# Patient Record
Sex: Female | Born: 1975 | Hispanic: Yes | Marital: Married
Health system: Southern US, Academic
[De-identification: ages and names within clinical notes are randomized; demographics above are authoritative.]

## PROBLEM LIST (undated history)

## (undated) ENCOUNTER — Encounter

## (undated) ENCOUNTER — Ambulatory Visit

## (undated) ENCOUNTER — Telehealth

## (undated) ENCOUNTER — Encounter: Attending: Certified Registered" | Primary: Certified Registered"

## (undated) ENCOUNTER — Ambulatory Visit: Payer: PRIVATE HEALTH INSURANCE

## (undated) ENCOUNTER — Telehealth: Attending: Certified Registered" | Primary: Certified Registered"

## (undated) ENCOUNTER — Ambulatory Visit: Payer: BLUE CROSS/BLUE SHIELD

## (undated) ENCOUNTER — Encounter: Payer: MEDICAID | Attending: Internal Medicine | Primary: Internal Medicine

## (undated) ENCOUNTER — Encounter: Attending: Internal Medicine | Primary: Internal Medicine

## (undated) ENCOUNTER — Ambulatory Visit
Payer: BLUE CROSS/BLUE SHIELD | Attending: Student in an Organized Health Care Education/Training Program | Primary: Student in an Organized Health Care Education/Training Program

## (undated) ENCOUNTER — Ambulatory Visit: Payer: PRIVATE HEALTH INSURANCE | Attending: Infectious Disease | Primary: Infectious Disease

## (undated) ENCOUNTER — Ambulatory Visit: Payer: MEDICAID

## (undated) ENCOUNTER — Encounter
Attending: Pharmacist Clinician (PhC)/ Clinical Pharmacy Specialist | Primary: Pharmacist Clinician (PhC)/ Clinical Pharmacy Specialist

## (undated) ENCOUNTER — Ambulatory Visit: Attending: Internal Medicine | Primary: Internal Medicine

## (undated) DIAGNOSIS — K7460 Unspecified cirrhosis of liver: Secondary | ICD-10-CM

---
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
## 2014-10-26 ENCOUNTER — Emergency Department (INDEPENDENT_AMBULATORY_CARE_PROVIDER_SITE_OTHER)
Admission: EM | Admit: 2014-10-26 | Discharge: 2014-10-26 | Disposition: A | Discharge status: Home / Self Care | Payer: Self-pay | Source: Home / Self Care | Attending: Family Medicine | Admitting: Family Medicine

## 2014-10-26 ENCOUNTER — Encounter: Payer: Self-pay | Admitting: Nurse Practitioner

## 2014-10-26 ENCOUNTER — Encounter (HOSPITAL_COMMUNITY): Payer: Self-pay | Admitting: *Deleted

## 2014-10-26 DIAGNOSIS — Z8619 Personal history of other infectious and parasitic diseases: Secondary | ICD-10-CM

## 2014-10-26 DIAGNOSIS — K7469 Other cirrhosis of liver: Secondary | ICD-10-CM

## 2014-10-26 DIAGNOSIS — D696 Thrombocytopenia, unspecified: Secondary | ICD-10-CM

## 2014-10-26 DIAGNOSIS — D72819 Decreased white blood cell count, unspecified: Secondary | ICD-10-CM

## 2014-10-26 HISTORY — DX: Unspecified cirrhosis of liver: K74.60

## 2014-10-26 LAB — COMPREHENSIVE METABOLIC PANEL
ALK PHOS: 123 U/L — AB (ref 39–117)
ALT: 41 U/L — AB (ref 0–35)
AST: 48 U/L — ABNORMAL HIGH (ref 0–37)
Albumin: 3.5 g/dL (ref 3.5–5.2)
Anion gap: 8 (ref 5–15)
BILIRUBIN TOTAL: 1.3 mg/dL — AB (ref 0.3–1.2)
BUN: 6 mg/dL (ref 6–23)
CO2: 24 mmol/L (ref 19–32)
Calcium: 8.1 mg/dL — ABNORMAL LOW (ref 8.4–10.5)
Chloride: 105 mmol/L (ref 96–112)
Creatinine, Ser: 0.61 mg/dL (ref 0.50–1.10)
GLUCOSE: 113 mg/dL — AB (ref 70–99)
POTASSIUM: 3.7 mmol/L (ref 3.5–5.1)
SODIUM: 137 mmol/L (ref 135–145)
Total Protein: 6.4 g/dL (ref 6.0–8.3)

## 2014-10-26 LAB — CBC WITH DIFFERENTIAL/PLATELET
BASOS PCT: 0 % (ref 0–1)
Basophils Absolute: 0 10*3/uL (ref 0.0–0.1)
EOS PCT: 3 % (ref 0–5)
Eosinophils Absolute: 0.1 10*3/uL (ref 0.0–0.7)
HEMATOCRIT: 35.6 % — AB (ref 36.0–46.0)
HEMOGLOBIN: 12.1 g/dL (ref 12.0–15.0)
LYMPHS PCT: 25 % (ref 12–46)
Lymphs Abs: 0.9 10*3/uL (ref 0.7–4.0)
MCH: 26.4 pg (ref 26.0–34.0)
MCHC: 34 g/dL (ref 30.0–36.0)
MCV: 77.7 fL — AB (ref 78.0–100.0)
MONO ABS: 0.3 10*3/uL (ref 0.1–1.0)
Monocytes Relative: 9 % (ref 3–12)
NEUTROS ABS: 2.2 10*3/uL (ref 1.7–7.7)
Neutrophils Relative %: 63 % (ref 43–77)
PLATELETS: 74 10*3/uL — AB (ref 150–400)
RBC: 4.58 MIL/uL (ref 3.87–5.11)
RDW: 17.2 % — AB (ref 11.5–15.5)
WBC: 3.6 10*3/uL — ABNORMAL LOW (ref 4.0–10.5)

## 2014-10-26 LAB — LIPASE, BLOOD: LIPASE: 27 U/L (ref 11–59)

## 2014-10-26 LAB — SEDIMENTATION RATE: SED RATE: 5 mm/h (ref 0–22)

## 2014-10-26 NOTE — ED Provider Notes (Signed)
CSN: [id number]     Arrival date & time 10/26/14  [id number] History   First MD Initiated Contact with Patient 10/26/14 1050     Chief Complaint  Patient presents with  . Hepatic Disease   (Consider location/radiation/quality/duration/timing/severity/associated sxs/prior Treatment) HPI      39 year old female with history of chronic cirrhosis, with a remote history of hepatitis A, presents complaining of pain in her liver as well as bone pain. She describes this as feeling like her liver pulses and throbs. It has been feeling this way for about 3 weeks. Also she describes pain all over her entire body for the past week in her bones. No alleviating or exacerbating factors. She is new to the area, she has never seen a gastroenterologist. She has frequent visits to the emergency department where she is from but they always tell her the same thing, she has cirrhosis and she needs to see her primary care provider.  Past Medical History  Diagnosis Date  . Hepatic cirrhosis    History reviewed. No pertinent past surgical history. History reviewed. No pertinent family history. History  Substance Use Topics  . Smoking status: Never Smoker   . Smokeless tobacco: Not on file  . Alcohol Use: No   OB History    Gravida Para Term Preterm AB TAB SAB Ectopic Multiple Living   1 1             Review of Systems  Constitutional: Negative for fever and chills.  Cardiovascular: Positive for leg swelling (chronic, controlled with Lasix).  Gastrointestinal: Positive for abdominal distention. Negative for nausea, vomiting and diarrhea.  Musculoskeletal:       Bone pain  All other systems reviewed and are negative.   Allergies  Penicillins  Home Medications   Prior to Admission medications   Medication Sig Start Date End Date Taking? Authorizing Provider  furosemide (LASIX) 20 MG tablet Take 20 mg by mouth.   Yes Historical Provider, MD  spironolactone (ALDACTONE) 25 MG tablet Take 25 mg by mouth daily.    Yes Historical Provider, MD   BP 129/75 mmHg  Pulse 67  Temp(Src) 98.3 F (36.8 C) (Oral)  Resp 14  SpO2 100% Physical Exam  Constitutional: She is oriented to person, place, and time. Vital signs are normal. She appears well-developed and well-nourished. No distress.  HENT:  Head: Normocephalic and atraumatic.  Eyes: Scleral icterus is present.  Pulmonary/Chest: Effort normal. No respiratory distress.  Abdominal: Soft. She exhibits no distension, no pulsatile liver, no ascites and no pulsatile midline mass. There is tenderness in the right upper quadrant. There is no rebound, no guarding and no CVA tenderness.  Neurological: She is alert and oriented to person, place, and time. She has normal strength. Coordination normal.  Skin: Skin is warm and dry. No rash noted. She is not diaphoretic.  Psychiatric: She has a normal mood and affect. Judgment normal.  Nursing note and vitals reviewed.   ED Course  Procedures (including critical care time) Labs Review Labs Reviewed  COMPREHENSIVE METABOLIC PANEL - Abnormal; Notable for the following:    Glucose, Bld 113 (*)    Calcium 8.1 (*)    AST 48 (*)    ALT 41 (*)    Alkaline Phosphatase 123 (*)    Total Bilirubin 1.3 (*)    All other components within normal limits  CBC WITH DIFFERENTIAL/PLATELET - Abnormal; Notable for the following:    WBC 3.6 (*)    HCT 35.6 (*)  MCV 77.7 (*)    RDW 17.2 (*)    Platelets 74 (*)    All other components within normal limits  LIPASE, BLOOD  SEDIMENTATION RATE    Imaging Review No results found.   MDM   1. Other cirrhosis of liver   2. History of hepatitis A   3. Chronic leukopenia   4. Thrombocytopenia    She has chronic cirrhosis, her Labs are unchanged from her previous labs 10 days ago. There is no evidence of any acute decompensation in her condition or encephalopathy. I have made her a follow-up appointment with gastroenterology for next week. We have also discussed with her  the importance of primary care follow-up, [facility] as [facility].    Graylon Good, PA-C 10/26/14 1300

## 2014-10-26 NOTE — ED Notes (Signed)
Pt states she is here to review her labs/medical history re: hepatic disease.

## 2014-10-26 NOTE — Discharge Instructions (Signed)
Cirrhosis  Cirrhosis is a condition of scarring of the liver which is caused when the liver has tried repairing itself following damage. This damage may come from a previous infection such as one of the forms of hepatitis (usually hepatitis C), or the damage may come from being injured by toxins. The main toxin that causes this damage is alcohol. The scarring of the liver from use of alcohol is irreversible. That means the liver cannot return to normal even though alcohol is not used any more. The main danger of hepatitis C infection is that it may cause long-lasting (chronic) liver disease, and this also may lead to cirrhosis. This complication is progressive and irreversible.  CAUSES   Prior to available blood tests, hepatitis C could be contracted by blood transfusions. Since testing of blood has improved, this is now unlikely. This infection can also be contracted through intravenous drug use and the sharing of needles. It can also be contracted through sexual relationships. The injury caused by alcohol comes from too much use. It is not a few drinks that poison the liver, but years of misuse. Usually there will be some signs and symptoms early with scarring of the liver that suggest the development of better habits. Alcohol should never be used while using acetaminophen. A small dose of both taken together may cause irreversible damage to the liver.  HOME CARE INSTRUCTIONS   There is no specific treatment for cirrhosis. However, there are things you can do to avoid making the condition worse.  · Rest as needed.  · Eat a well-balanced diet. Your caregiver can help you with suggestions.  · Vitamin supplements including vitamins A, K, D, and thiamine can help.  · A low-salt diet, water restriction, or diuretic medicine may be needed to reduce fluid retention.  · Avoid alcohol. This can be extremely toxic if combined with acetaminophen.  · Avoid drugs which are toxic to the liver. Some of these include isoniazid,  methyldopa, acetaminophen, anabolic steroids (muscle-building drugs), erythromycin, and oral contraceptives (birth control pills). Check with your caregiver to make sure medicines you are presently taking will not be harmful.  · Periodic blood tests may be required. Follow your caregiver's advice regarding the timing of these.  · Milk thistle is an herbal remedy which does protect the liver against toxins. However, it will not help once the liver has been scarred.  SEEK MEDICAL CARE IF:  · You have increasing fatigue or weakness.  · You develop swelling of the hands, feet, legs, or face.  · You vomit bright red blood, or a coffee ground appearing material.  · You have blood in your stools, or the stools turn black and tarry.  · You have a fever.  · You develop loss of appetite, or have nausea and vomiting.  · You develop jaundice.  · You develop easy bruising or bleeding.  · You have worsening of any of the problems you are concerned about.  Document Released: 08/10/2005 Document Revised: 11/02/2011 Document Reviewed: 03/28/2008  ExitCare® Patient Information ©2015 [employer]. This information is not intended to replace advice given to you by your health care provider. Make sure you discuss any questions you have with your health care provider.

## 2014-11-01 ENCOUNTER — Other Ambulatory Visit: Payer: Self-pay

## 2014-11-01 ENCOUNTER — Encounter: Payer: Self-pay | Admitting: Nurse Practitioner

## 2014-11-01 ENCOUNTER — Ambulatory Visit (INDEPENDENT_AMBULATORY_CARE_PROVIDER_SITE_OTHER): Payer: Self-pay | Admitting: Nurse Practitioner

## 2014-11-01 VITALS — BP 106/70 | HR 68 | Ht 62.5 in | Wt 154.1 lb

## 2014-11-01 DIAGNOSIS — K7460 Unspecified cirrhosis of liver: Secondary | ICD-10-CM

## 2014-11-01 DIAGNOSIS — R1011 Right upper quadrant pain: Secondary | ICD-10-CM

## 2014-11-01 NOTE — Patient Instructions (Signed)
Go to the basement today for labs  You have been scheduled for an endoscopy. Please follow written instructions given to you at your visit today. If you use inhalers (even only as needed), please bring them with you on the day of your procedure. Your physician has requested that you go to www.startemmi.com and enter the access code given to you at your visit today. This web site gives a general overview about your procedure. However, you should still follow specific instructions given to you by our office regarding your preparation for the procedure.  Follow up with Dr Arlyce DiceKaplan on 12/24/2014 at 9:15am

## 2014-11-01 NOTE — Progress Notes (Addendum)
    HPI : Patient is a 39 year old female referred by Dr. Konrad DoloresMerrell at [facility]. She has a history of cirrhosis diagnosed in 2008. Etiology unknown, she gives a history of hepatitis A. No alcohol use. No family history of cirrhosis. Patient was hospitalized in [place] February 2013, she brought in some records. At that time her ferritin was 15, ANA negative, alpha-1 antitrypsin normal, smooth muscle antibody negative, ceruloplasmin negative, antimitochondrial antibody. Total hepatitis A antibody was reactive. Hepatitis B surface antibody was negative.. Her INR was 1.4, ALT 47, AST 47, WBC 2.8, platelet count 58, hemoglobin 13.1.  Patient has not been followed by a gastroenterologist or hepatologist.  She has had several emergency room department visits [place] and [place]. Her most health care visit was to sit to urgent care on the seventh of this month was for evaluation of bone and liver pain. Labs today reveal AST 48, ALT 41, alkaline phosphatase 123, total bilirubin 1.3. White count 3.6, platelets 74.   Past Medical History  Diagnosis Date  . Hepatic cirrhosis     Family History  Problem Relation Age of Onset  . Hypertension Mother   . Diabetes Father    History  Substance Use Topics  . Smoking status: Never Smoker   . Smokeless tobacco: Never Used  . Alcohol Use: No   Current Outpatient Prescriptions  Medication Sig Dispense Refill  . furosemide (LASIX) 20 MG tablet Take 20 mg by mouth.    . spironolactone (ALDACTONE) 25 MG tablet Take 25 mg by mouth daily.     No current facility-administered medications for this visit.   Allergies  Allergen Reactions  . Penicillins      Review of Systems: All systems reviewed and negative except where noted in HPI.   Physical Exam: BP 106/70 mmHg  Pulse 68  Ht 5' 2.5" (1.588 m)  Wt 154 lb 2 oz (69.911 kg)  BMI 27.72 kg/m2  LMP 10/25/2014 Constitutional: Pleasant,well-developed, female in no acute  distress. HEENT: Normocephalic and atraumatic. Conjunctivae are normal. No scleral icterus. Neck supple.  Cardiovascular: Normal rate, regular rhythm.  Pulmonary/chest: Effort normal and breath sounds normal. No wheezing, rales or rhonchi. Abdominal: Soft, nondistended, nontender. Bowel sounds active throughout. There are no masses palpable.  Extremities: no edema Lymphadenopathy: No cervical adenopathy noted. Neurological: Alert and oriented to person place and time. Skin: Skin is warm and dry. No rashes noted. Psychiatric: Normal mood and affect. Behavior is normal.   ASSESSMENT AND PLAN:  451.  39 year old female from [place] with cirrhosis (probably idiopathic) diagnosed 8 years ago. She has had no outpatient care with gastroenterology or hepatology. Etiology of cirrhosis unclear. Genetic and autoimmune markers negative in 2013 [facility]([place]). Additionally check for chronic hepatitis B and hepatitis C. No evidence for decompensation.   Check hepatitis A,B,C serologies. May need Hep A / B vaccinations if not immune.  Check IgG  Check alpha-fetoprotein and ultrasound for HCC screening.   Patient needs upper endoscopy for varices screening.  Outpatient follow-up with us will be important and she will likely need referral to transplant hepatology at some point.  Referral to [facility] financial services. She has no insurance  2. RUQ pain. She feels like "liver throbs" when she eats. Await ultrasound and EGD results.

## 2014-11-02 DIAGNOSIS — K7460 Unspecified cirrhosis of liver: Secondary | ICD-10-CM | POA: Insufficient documentation

## 2014-11-02 DIAGNOSIS — R1011 Right upper quadrant pain: Secondary | ICD-10-CM | POA: Insufficient documentation

## 2014-11-02 LAB — IGG: IGG (IMMUNOGLOBIN G), SERUM: 1390 mg/dL (ref 690–1700)

## 2014-11-02 LAB — HEPATITIS C ANTIBODY: HCV AB: NEGATIVE

## 2014-11-02 LAB — HEPATITIS B SURFACE ANTIGEN: Hepatitis B Surface Ag: NEGATIVE

## 2014-11-02 LAB — HEPATITIS A ANTIBODY, TOTAL: Hep A Total Ab: REACTIVE — AB

## 2014-11-02 LAB — AFP TUMOR MARKER: AFP TUMOR MARKER: 2.1 ng/mL (ref <6.1)

## 2014-11-02 LAB — HEPATITIS B SURFACE ANTIBODY,QUALITATIVE: Hep B S Ab: NEGATIVE

## 2014-11-04 NOTE — Progress Notes (Signed)
Reviewed and agree with management. Robert D. Kaplan, M.D., FACG  

## 2014-11-07 ENCOUNTER — Other Ambulatory Visit: Payer: Self-pay

## 2014-11-07 ENCOUNTER — Ambulatory Visit (AMBULATORY_SURGERY_CENTER): Payer: Self-pay | Admitting: Gastroenterology

## 2014-11-07 ENCOUNTER — Encounter: Payer: Self-pay | Admitting: Gastroenterology

## 2014-11-07 VITALS — BP 114/74 | HR 62 | Temp 97.6°F | Resp 16 | Ht 60.0 in | Wt 154.0 lb

## 2014-11-07 DIAGNOSIS — K7460 Unspecified cirrhosis of liver: Secondary | ICD-10-CM

## 2014-11-07 DIAGNOSIS — R1011 Right upper quadrant pain: Secondary | ICD-10-CM

## 2014-11-07 DIAGNOSIS — I8500 Esophageal varices without bleeding: Secondary | ICD-10-CM

## 2014-11-07 MED ORDER — NADOLOL 40 MG PO TABS: 40.0000 mg | ORAL_TABLET | Freq: Every day | ORAL | Status: DC

## 2014-11-07 MED ORDER — SODIUM CHLORIDE 0.9 % IV SOLN: 500.0000 mL | INTRAVENOUS | Status: DC

## 2014-11-07 NOTE — Op Note (Signed)
 [facility] [address] [place], [id number]   ENDOSCOPY PROCEDURE REPORT  PATIENT: Yolanda Guzman, Pricella  MR#: [id number] BIRTHDATE: 12-09-1975 , 38  yrs. old GENDER: female ENDOSCOPIST: Louis Meckelobert D Kaplan, MD REFERRED BY: PROCEDURE DATE:  11/07/2014 PROCEDURE:  EGD, diagnostic ASA CLASS:     Class III INDICATIONS:  screening for varices. MEDICATIONS: Monitored anesthesia care and Propofol 250 mg IV TOPICAL ANESTHETIC:  DESCRIPTION OF PROCEDURE: After the risks benefits and alternatives of the procedure were thoroughly explained, informed consent was obtained.  The LB [id number] endoscope was introduced through the mouth and advanced to the second portion of the duodenum , Without limitations.  The instrument was slowly withdrawn as the mucosa was fully examined.      ESOPHAGUS: There were 2 columns of medium sized varices in the distal esophagus.  Grade 2-3.  STOMACH: Congested gastropathy was found in the cardia and gastric fundus.  Retroflexed views revealed no abnormalities.     The scope was then withdrawn from the patient and the procedure completed.  COMPLICATIONS: There were no immediate complications.  ENDOSCOPIC IMPRESSION: 1.  Esophageal varices 2.  Portal hypertensive gastropathy  RECOMMENDATIONS: 1.  Begin nadolol 40 mg daily 2.  Follow-up endoscopy one year 3.  Vaccination for hepatitis B 4.  Abdominal ultrasound 5.  Office visit 3 months  REPEAT EXAM:  eSigned:  Louis Meckelobert D Kaplan, MD 11/07/2014 11:43 AM    CC:  PATIENT NAME:  Yolanda Guzman, Irini MR#: [id number]

## 2014-11-07 NOTE — Patient Instructions (Addendum)
YOU HAD AN ENDOSCOPIC PROCEDURE TODAY AT THE [facility]:   Refer to the procedure report that was given to you for any specific questions about what was found during the examination.  If the procedure report does not answer your questions, please call your gastroenterologist to clarify.  If you requested that your care partner not be given the details of your procedure findings, then the procedure report has been included in a sealed envelope for you to review at your convenience later.  YOU SHOULD EXPECT: Some feelings of bloating in the abdomen. Passage of more gas than usual.  Walking can help get rid of the air that was put into your GI tract during the procedure and reduce the bloating. If you had a lower endoscopy (such as a colonoscopy or flexible sigmoidoscopy) you may notice spotting of blood in your stool or on the toilet paper. If you underwent a bowel prep for your procedure, you may not have a normal bowel movement for a few days.  Please Note:  You might notice some irritation and congestion in your nose or some drainage.  This is from the oxygen used during your procedure.  There is no need for concern and it should clear up in a day or so.  SYMPTOMS TO REPORT IMMEDIATELY:   Following upper endoscopy (EGD)  Vomiting of blood or coffee ground material  New chest pain or pain under the shoulder blades  Painful or persistently difficult swallowing  New shortness of breath  Fever of 100F or higher  Black, tarry-looking stools  For urgent or emergent issues, a gastroenterologist can be reached at any hour by calling [phone].   DIET: Your first meal following the procedure should be a small meal and then it is ok to progress to your normal diet. Heavy or fried foods are harder to digest and may make you feel nauseous or bloated.  Likewise, meals heavy in dairy and vegetables can increase bloating.  Drink plenty of fluids but you should avoid alcoholic beverages for  24 hours.  ACTIVITY:  You should plan to take it easy for the rest of today and you should NOT DRIVE or use heavy machinery until tomorrow (because of the sedation medicines used during the test).    FOLLOW UP: Our staff will call the number listed on your records the next business day following your procedure to check on you and address any questions or concerns that you may have regarding the information given to you following your procedure. If we do not reach you, we will leave a message.  However, if you are feeling well and you are not experiencing any problems, there is no need to return our call.  We will assume that you have returned to your regular daily activities without incident.  If any biopsies were taken you will be contacted by phone or by letter within the next 1-3 weeks.  Please call us at [phone] if you have not heard about the biopsies in 3 weeks.    SIGNATURES/CONFIDENTIALITY: You and/or your care partner have signed paperwork which will be entered into your electronic medical record.  These signatures attest to the fact that that the information above on your After Visit Summary has been reviewed and is understood.  Full responsibility of the confidentiality of this discharge information lies with you and/or your care-partner.  Recommendations Discharge instructions given to patient and/or care partner. Ultrasound to be scheduled. Nadolol 40 mg daily. Office visit in  3 months.

## 2014-11-08 ENCOUNTER — Telehealth: Payer: Self-pay

## 2014-11-08 ENCOUNTER — Ambulatory Visit (HOSPITAL_COMMUNITY)
Admission: RE | Admit: 2014-11-08 | Discharge: 2014-11-08 | Disposition: A | Discharge status: Home / Self Care | Payer: Self-pay | Source: Ambulatory Visit | Attending: Gastroenterology | Admitting: Gastroenterology

## 2014-11-08 ENCOUNTER — Other Ambulatory Visit: Payer: Self-pay

## 2014-11-08 DIAGNOSIS — K7460 Unspecified cirrhosis of liver: Secondary | ICD-10-CM

## 2014-11-08 DIAGNOSIS — R1011 Right upper quadrant pain: Secondary | ICD-10-CM | POA: Insufficient documentation

## 2014-11-08 DIAGNOSIS — Z23 Encounter for immunization: Secondary | ICD-10-CM

## 2014-11-08 DIAGNOSIS — I8510 Secondary esophageal varices without bleeding: Secondary | ICD-10-CM | POA: Insufficient documentation

## 2014-11-08 NOTE — Telephone Encounter (Signed)
  Follow up Call-  Call back number 11/07/2014  Post procedure Call Back phone  # [phone]  Permission to leave phone message Yes     Patient questions:  Do you have a fever, pain , or abdominal swelling? No. Pain Score  0 *  Have you tolerated food without any problems? Yes.    Have you been able to return to your normal activities? Yes.    Do you have any questions about your discharge instructions: Diet   No. Medications  No. Follow up visit  No.  Do you have questions or concerns about your Care? No.  Actions: * If pain score is 4 or above: No action needed, pain <4.

## 2014-11-26 ENCOUNTER — Ambulatory Visit: Payer: Self-pay | Attending: Internal Medicine

## 2014-12-03 ENCOUNTER — Encounter: Payer: Self-pay | Admitting: Internal Medicine

## 2014-12-03 ENCOUNTER — Ambulatory Visit: Payer: Self-pay | Attending: Internal Medicine | Admitting: Internal Medicine

## 2014-12-03 VITALS — BP 116/73 | HR 72 | Temp 98.8°F | Resp 16 | Wt 157.0 lb

## 2014-12-03 DIAGNOSIS — Z833 Family history of diabetes mellitus: Secondary | ICD-10-CM

## 2014-12-03 DIAGNOSIS — Z139 Encounter for screening, unspecified: Secondary | ICD-10-CM

## 2014-12-03 DIAGNOSIS — K029 Dental caries, unspecified: Secondary | ICD-10-CM

## 2014-12-03 DIAGNOSIS — I8500 Esophageal varices without bleeding: Secondary | ICD-10-CM

## 2014-12-03 DIAGNOSIS — K7460 Unspecified cirrhosis of liver: Secondary | ICD-10-CM

## 2014-12-03 DIAGNOSIS — H538 Other visual disturbances: Secondary | ICD-10-CM

## 2014-12-03 LAB — COMPLETE METABOLIC PANEL WITH GFR
ALBUMIN: 3 g/dL — AB (ref 3.5–5.2)
ALT: 33 U/L (ref 0–35)
AST: 42 U/L — ABNORMAL HIGH (ref 0–37)
Alkaline Phosphatase: 98 U/L (ref 39–117)
BUN: 7 mg/dL (ref 6–23)
CO2: 28 mEq/L (ref 19–32)
Calcium: 7.8 mg/dL — ABNORMAL LOW (ref 8.4–10.5)
Chloride: 105 mEq/L (ref 96–112)
Creat: 0.51 mg/dL (ref 0.50–1.10)
GFR, Est African American: >89 mL/min
GFR, Est Non African American: >89 mL/min
Glucose, Bld: 172 mg/dL — ABNORMAL HIGH (ref 70–99)
POTASSIUM: 4.1 meq/L (ref 3.5–5.3)
Sodium: 136 mEq/L (ref 135–145)
Total Bilirubin: 0.7 mg/dL (ref 0.2–1.2)
Total Protein: 5.4 g/dL — ABNORMAL LOW (ref 6.0–8.3)

## 2014-12-03 LAB — TSH: TSH: 1.821 u[IU]/mL (ref 0.350–4.500)

## 2014-12-03 LAB — HEMOGLOBIN A1C
HEMOGLOBIN A1C: 5.7 % — AB (ref <5.7)
MEAN PLASMA GLUCOSE: 117 mg/dL — AB (ref <117)

## 2014-12-03 NOTE — Progress Notes (Signed)
Patient Demographics  Yolanda Guzman, is a 39 y.o. female  ZOX:[id number]:[id number]  WJX:[id number]  DOB - [birth year]  CC:  Chief Complaint  Patient presents with  . new patient       HPI: Yolanda Guzman is a 39 y.o. female here today to establish medical care.patient has history of cirrhosis, has been following up with the GI, EMR reviewed patient had endoscopy done reported to have these official lysis, she was put on nadolol which she has been taking, also had abdominal ultrasound done which reported ascites, since then patient has been on Lasix and spironolactone, she is also getting hepatitis B vaccination series, first shot she got last month and she is due for another one and is already scheduled, today she is requesting referral back for GI, also complaining of blurry vision and she wears corrective glasses and is requesting referral to see ophthalmologist, has several dental cavities and is requesting referral to see a dentist Patient has No headache, No chest pain, No abdominal pain - No Nausea, No new weakness tingling or numbness, No Cough - SOB.  Allergies  Allergen Reactions  . Penicillins    Past Medical History  Diagnosis Date  . Hepatic cirrhosis    Current Outpatient Prescriptions on File Prior to Visit  Medication Sig Dispense Refill  . furosemide (LASIX) 20 MG tablet Take 20 mg by mouth.    . nadolol (CORGARD) 40 MG tablet Take 1 tablet (40 mg total) by mouth daily. 30 tablet 5  . spironolactone (ALDACTONE) 25 MG tablet Take 25 mg by mouth daily.     No current facility-administered medications on file prior to visit.   Family History  Problem Relation Age of Onset  . Hypertension Mother   . Diabetes Father    History   Social History  . Marital Status: Married    Spouse Name: N/A  . Number of Children: 2  . Years of Education: N/A   Occupational History  . Not on file.   Social History Main Topics  . Smoking status: Never Smoker   . Smokeless tobacco:  Never Used  . Alcohol Use: No  . Drug Use: No  . Sexual Activity: Yes   Other Topics Concern  . Not on file   Social History Narrative    Review of Systems: Constitutional: Negative for fever, chills, diaphoresis, activity change, appetite change and fatigue. HENT: Negative for ear pain, nosebleeds, congestion, facial swelling, rhinorrhea, neck pain, neck stiffness and ear discharge.  Eyes: Negative for pain, discharge, redness, itching and visual disturbance. Respiratory: Negative for cough, choking, chest tightness, shortness of breath, wheezing and stridor.  Cardiovascular: Negative for chest pain, palpitations and leg swelling. Gastrointestinal: Negative for abdominal distention. Genitourinary: Negative for dysuria, urgency, frequency, hematuria, flank pain, decreased urine volume, difficulty urinating and dyspareunia.  Musculoskeletal: Negative for back pain, joint swelling, arthralgia and gait problem. Neurological: Negative for dizziness, tremors, seizures, syncope, facial asymmetry, speech difficulty, weakness, light-headedness, numbness and headaches.  Hematological: Negative for adenopathy. Does not bruise/bleed easily. Psychiatric/Behavioral: Negative for hallucinations, behavioral problems, confusion, dysphoric mood, decreased concentration and agitation.    Objective:   Filed Vitals:   12/03/14 1550  BP: 116/73  Pulse: 72  Temp: 98.8 F (37.1 C)  Resp: 16    Physical Exam: Constitutional: Patient appears well-developed and well-nourished. No distress. HENT: Normocephalic, atraumatic, External right and left ear normal. Oropharynx is clear and moist.  Eyes: Conjunctivae and EOM are normal. PERRLA, no scleral icterus. Neck:  Normal ROM. Neck supple. No JVD. No tracheal deviation. No thyromegaly. CVS: RRR, S1/S2 +, no murmurs, no gallops, no carotid bruit.  Pulmonary: Effort and breath sounds normal, no stridor, rhonchi, wheezes, rales.  Abdominal: Soft. BS +, no  distension, tenderness, rebound or guarding.  Musculoskeletal: Normal range of motion. No edema and no tenderness.  Neuro: Alert. Normal reflexes, muscle tone coordination. No cranial nerve deficit. Skin: Skin is warm and dry. No rash noted. Not diaphoretic. No erythema. No pallor. Psychiatric: Normal mood and affect. Behavior, judgment, thought content normal.  Lab Results  Component Value Date   WBC 3.6* 10/26/2014   HGB 12.1 10/26/2014   HCT 35.6* 10/26/2014   MCV 77.7* 10/26/2014   PLT 74* 10/26/2014   Lab Results  Component Value Date   CREATININE 0.61 10/26/2014   BUN 6 10/26/2014   NA 137 10/26/2014   K 3.7 10/26/2014   CL 105 10/26/2014   CO2 24 10/26/2014    No results found for: HGBA1C Lipid Panel  No results found for: CHOL, TRIG, HDL, CHOLHDL, VLDL, LDLCALC     Assessment and plan:   1. Hepatic cirrhosis, unspecified hepatic cirrhosis type Patient to follow with GI currently on Lasix and spironolactone, she's getting hepatitis B vaccination shots.  - COMPLETE METABOLIC PANEL WITH GFR - Ambulatory referral to Gastroenterology  2. Esophageal varices Currently patient is on nadolol.  3. Blurry vision  - Ambulatory referral to Ophthalmology  4. Family history of diabetes mellitus (DM)  - Hemoglobin A1c  5. Dental cavities  - Ambulatory referral to Dentistry  6. Screening  - Vit D  25 hydroxy (rtn osteoporosis monitoring) - COMPLETE METABOLIC PANEL WITH GFR - TSH   Health Maintenance  -Pap Smear: as per patient she recently had a Pap smear done   Return in about 3 months (around 03/04/2015), or if symptoms worsen or fail to improve.    The patient was given clear instructions to go to ER or return to medical center if symptoms don't improve, worsen or new problems develop. The patient verbalized understanding. The patient was told to call to get lab results if they haven't heard anything in the next week.    This note has been created with  Education officer, environmental. Any transcriptional errors are unintentional.   Doris Cheadle, MD

## 2014-12-03 NOTE — Progress Notes (Signed)
Patient here to establish care Patient has hepatic cirrhosis and follows up with gi Requesting referrals to , dentist and eye dr

## 2014-12-04 ENCOUNTER — Telehealth: Payer: Self-pay

## 2014-12-04 LAB — VITAMIN D 25 HYDROXY (VIT D DEFICIENCY, FRACTURES): VIT D 25 HYDROXY: 21 ng/mL — AB (ref 30–100)

## 2014-12-04 MED ORDER — VITAMIN D (ERGOCALCIFEROL) 1.25 MG (50000 UNIT) PO CAPS
50000.0000 [IU] | ORAL_CAPSULE | ORAL | Status: DC
Start: 2014-12-04 — End: 2016-03-17

## 2014-12-04 NOTE — Telephone Encounter (Signed)
Patient is aware of her lab results [facility]  

## 2014-12-04 NOTE — Telephone Encounter (Signed)
-----   Message from Doris Cheadleeepak Advani, MD sent at 12/04/2014  9:51 AM EDT ----- Blood work reviewed, noticed low vitamin D, call patient advise to start ergocalciferol 50,000 units once a week for the duration of  12 weeks, then take OTC vitamin d 2000 units daily.  noticed hemoglobin A1c of  5.7%, patient has prediabetes, call and advise patient for low carbohydrate diet.

## 2014-12-10 ENCOUNTER — Ambulatory Visit (INDEPENDENT_AMBULATORY_CARE_PROVIDER_SITE_OTHER): Payer: Self-pay | Admitting: Gastroenterology

## 2014-12-10 DIAGNOSIS — Z23 Encounter for immunization: Secondary | ICD-10-CM

## 2014-12-14 ENCOUNTER — Telehealth: Payer: Self-pay | Admitting: Gastroenterology

## 2014-12-14 MED ORDER — NADOLOL 40 MG PO TABS: 40.0000 mg | ORAL_TABLET | Freq: Every day | ORAL | Status: DC

## 2014-12-14 NOTE — Telephone Encounter (Signed)
Informed the patient that med was sent

## 2014-12-24 ENCOUNTER — Encounter: Payer: Self-pay | Admitting: Gastroenterology

## 2014-12-24 ENCOUNTER — Ambulatory Visit (INDEPENDENT_AMBULATORY_CARE_PROVIDER_SITE_OTHER): Payer: Self-pay | Admitting: Gastroenterology

## 2014-12-24 VITALS — BP 106/60 | HR 60 | Ht 62.5 in | Wt 157.4 lb

## 2014-12-24 DIAGNOSIS — K7469 Other cirrhosis of liver: Secondary | ICD-10-CM

## 2014-12-24 NOTE — Assessment & Plan Note (Signed)
Stable hepatic function.  She was started on Corgard because of esophageal varices.  This may be contributing to her fatigue.  Recommendations #1 I am uncertain why she was started on low-dose Aldactone and Lasix but plan to continue #2 referral to [facility]

## 2014-12-24 NOTE — Patient Instructions (Signed)
[facility]  We will contact you with that appointment when it becomes available

## 2014-12-24 NOTE — Progress Notes (Signed)
      History of Present Illness:  Ms. Lavenia AtlasSoch is returned for follow-up of cirrhosis.  Upper endoscopy demonstrated grade 2-3 varices.  She was started on Corgard.  She complains of fatigue and some postprandial fullness in the right upper quadrant.  She was vaccinated for hepatitis B.    Review of Systems: Pertinent positive and negative review of systems were noted in the above HPI section. All other review of systems were otherwise negative.    Current Medications, Allergies, Past Medical History, Past Surgical History, Family History and Social History were reviewed in [facility] electronic medical record  Vital signs were reviewed in today's medical record. Physical Exam: General: Well developed , well nourished, no acute distress Skin: anicteric Head: Normocephalic and atraumatic Eyes:  sclerae anicteric, EOMI Ears: Normal auditory acuity Mouth: No deformity or lesions Lymph Nodes: no lymphadenopathy Lungs: Clear throughout to auscultation Heart: Regular rate and rhythm; no murmurs, rubs or brui: Gastroinestinal:  Soft, non tender and non distended. No masses, hepatosplenomegaly or hernias noted. Normal Bowel sounds Rectal:deferred Musculoskeletal: Symmetrical with no gross deformities  Pulses:  Normal pulses noted Extremities: No clubbing, cyanosis, edema or deformities noted Neurological: Alert oriented x 4, grossly nonfocal Psychological:  Alert and cooperative. Normal mood and affect  See Assessment and Plan under Problem List

## 2014-12-25 ENCOUNTER — Telehealth: Payer: Self-pay | Admitting: *Deleted

## 2014-12-25 NOTE — Telephone Encounter (Signed)
NOTES FAXED TODAY ON 12/25/2014 TO [facility] FOR REFERRAL

## 2014-12-27 ENCOUNTER — Telehealth: Payer: Self-pay | Admitting: Internal Medicine

## 2014-12-27 NOTE — Telephone Encounter (Signed)
Patient has called in today to see if she can receive a dental referral; please f/u with patient about her request;

## 2014-12-28 ENCOUNTER — Telehealth: Payer: Self-pay

## 2014-12-28 NOTE — Telephone Encounter (Signed)
Referral for dentist already placed in Epic

## 2014-12-28 NOTE — Telephone Encounter (Signed)
Patient is aware her dental referral was already placed in Epic

## 2015-01-03 ENCOUNTER — Encounter: Payer: Self-pay | Admitting: Internal Medicine

## 2015-01-03 ENCOUNTER — Ambulatory Visit: Payer: Self-pay | Attending: Internal Medicine | Admitting: Internal Medicine

## 2015-01-03 VITALS — BP 113/71 | HR 66 | Temp 98.0°F | Resp 16 | Wt 162.2 lb

## 2015-01-03 DIAGNOSIS — R7309 Other abnormal glucose: Secondary | ICD-10-CM | POA: Insufficient documentation

## 2015-01-03 DIAGNOSIS — K7460 Unspecified cirrhosis of liver: Secondary | ICD-10-CM | POA: Insufficient documentation

## 2015-01-03 DIAGNOSIS — I8500 Esophageal varices without bleeding: Secondary | ICD-10-CM | POA: Insufficient documentation

## 2015-01-03 DIAGNOSIS — R7303 Prediabetes: Secondary | ICD-10-CM

## 2015-01-03 NOTE — Patient Instructions (Signed)
Diabetes Mellitus and Food It is important for you to manage your blood sugar (glucose) level. Your blood glucose level can be greatly affected by what you eat. Eating healthier foods in the appropriate amounts throughout the day at about the same time each day will help you control your blood glucose level. It can also help slow or prevent worsening of your diabetes mellitus. Healthy eating may even help you improve the level of your blood pressure and reach or maintain a healthy weight.  HOW CAN FOOD AFFECT ME? Carbohydrates Carbohydrates affect your blood glucose level more than any other type of food. Your dietitian will help you determine how many carbohydrates to eat at each meal and teach you how to count carbohydrates. Counting carbohydrates is important to keep your blood glucose at a healthy level, especially if you are using insulin or taking certain medicines for diabetes mellitus. Alcohol Alcohol can cause sudden decreases in blood glucose (hypoglycemia), especially if you use insulin or take certain medicines for diabetes mellitus. Hypoglycemia can be a life-threatening condition. Symptoms of hypoglycemia (sleepiness, dizziness, and disorientation) are similar to symptoms of having too much alcohol.  If your health care provider has given you approval to drink alcohol, do so in moderation and use the following guidelines:  Women should not have more than one drink per day, and men should not have more than two drinks per day. One drink is equal to:  12 oz of beer.  5 oz of wine.  1 oz of hard liquor.  Do not drink on an empty stomach.  Keep yourself hydrated. Have water, diet soda, or unsweetened iced tea.  Regular soda, juice, and other mixers might contain a lot of carbohydrates and should be counted. WHAT FOODS ARE NOT RECOMMENDED? As you make food choices, it is important to remember that all foods are not the same. Some foods have fewer nutrients per serving than other  foods, even though they might have the same number of calories or carbohydrates. It is difficult to get your body what it needs when you eat foods with fewer nutrients. Examples of foods that you should avoid that are high in calories and carbohydrates but low in nutrients include:  Trans fats (most processed foods list trans fats on the Nutrition Facts label).  Regular soda.  Juice.  Candy.  Sweets, such as cake, pie, doughnuts, and cookies.  Fried foods. WHAT FOODS CAN I EAT? Have nutrient-rich foods, which will nourish your body and keep you healthy. The food you should eat also will depend on several factors, including:  The calories you need.  The medicines you take.  Your weight.  Your blood glucose level.  Your blood pressure level.  Your cholesterol level. You also should eat a variety of foods, including:  Protein, such as meat, poultry, fish, tofu, nuts, and seeds (lean animal proteins are best).  Fruits.  Vegetables.  Dairy products, such as milk, cheese, and yogurt (low fat is best).  Breads, grains, pasta, cereal, rice, and beans.  Fats such as olive oil, trans fat-free margarine, canola oil, avocado, and olives. DOES EVERYONE WITH DIABETES MELLITUS HAVE THE SAME MEAL PLAN? Because every person with diabetes mellitus is different, there is not one meal plan that works for everyone. It is very important that you meet with a dietitian who will help you create a meal plan that is just right for you. Document Released: 05/07/2005 Document Revised: 08/15/2013 Document Reviewed: 07/07/2013 ExitCare Patient Information 2015 [employer]. This   information is not intended to replace advice given to you by your health care provider. Make sure you discuss any questions you have with your health care provider.  

## 2015-01-03 NOTE — Progress Notes (Signed)
Subjective:     Patient ID: Yolanda Guzman, female   DOB: 10Hulan Fray/20/1977, 39 y.o.   MRN: [id number]  HPI  Yolanda Guzman is a 39 yo Hispanic female with cirrhosis due to unclear cause in process of waiting to receive a liver transplant and esophageal varices. She is here today for questions regarding her current medications. Patient says she has read about cirrhosis and varices on the internet and is concerned that she is not receiving any treatment for her varices. She reports taking all of her medications everyday. Notes she has loose stools and that her RUQ feels "full and swollen" after meals. Denies history of tobacco use, alcohol use, and IVDU. Denies nausea, vomiting, constipation. Denies blood in urine or stool. Denies headache, blurry vision, altered mental status. No dyspnea, SOB, chest pain, palpitations, swelling in hands or feet.   She also complains of a non-itchy rash on her shins that seems to randomly appear and disappear. Describes rash as spots of hypopigmentation on anterior shins. Denies use of any new topical agents that could have caused the rash.    Active Ambulatory Problems    Diagnosis Date Noted  . RUQ abdominal pain 11/02/2014  . Cirrhosis 11/02/2014   Resolved Ambulatory Problems    Diagnosis Date Noted  . No Resolved Ambulatory Problems   Past Medical History  Diagnosis Date  . Hepatic cirrhosis    History   Social History  . Marital Status: Married    Spouse Name: N/A  . Number of Children: 2  . Years of Education: N/A   Occupational History  . Not on file.   Social History Main Topics  . Smoking status: Never Smoker   . Smokeless tobacco: Never Used  . Alcohol Use: No  . Drug Use: No  . Sexual Activity: Yes   Other Topics Concern  . Not on file   Social History Narrative      Medication List       This list is accurate as of: 01/03/15 12:50 PM.  Always use your most recent med list.               furosemide 20 MG tablet  Commonly known as:   LASIX  Take 20 mg by mouth.     nadolol 40 MG tablet  Commonly known as:  CORGARD  Take 1 tablet (40 mg total) by mouth daily.     spironolactone 25 MG tablet  Commonly known as:  ALDACTONE  Take 25 mg by mouth daily.     Vitamin D (Ergocalciferol) 50000 UNITS Caps capsule  Commonly known as:  DRISDOL  Take 1 capsule (50,000 Units total) by mouth every 7 (seven) days.        Review of Systems  Constitutional: Negative for fever, activity change, appetite change and fatigue.  Eyes: Negative for visual disturbance.  Respiratory: Negative for cough, chest tightness and shortness of breath.   Cardiovascular: Negative for chest pain, palpitations and leg swelling.  Gastrointestinal: Positive for abdominal pain, diarrhea and abdominal distention. Negative for nausea, vomiting, constipation and blood in stool.  Genitourinary: Negative for hematuria and difficulty urinating.  Musculoskeletal: Negative for joint swelling and arthralgias.  Skin: Positive for rash.  Neurological: Negative for dizziness, weakness, light-headedness, numbness and headaches.   Objective: Filed Vitals:   01/03/15 1200  BP: 113/71  Pulse: 66  Temp: 98 F (36.7 C)  Resp: 16     Physical Exam  Constitutional:  Well-appearing, pleasant, anicteric 39 yo Hispanic woman  alert, oriented, well-appearing.   HENT:  Head: Normocephalic and atraumatic.  Eyes: Conjunctivae and EOM are normal. Pupils are equal, round, and reactive to light.  Neck: Normal range of motion. Neck supple.  Cardiovascular: Normal rate, regular rhythm, normal heart sounds and intact distal pulses.   Pulmonary/Chest: Effort normal and breath sounds normal.  Abdominal: Soft. Bowel sounds are normal. She exhibits no mass. There is no tenderness. There is no rebound and no guarding.  Musculoskeletal: Normal range of motion. She exhibits no edema or tenderness.  Skin: No rash noted.    Assessment & Plan:   Yolanda Guzman is a 39 yo Hispanic  female with cirrhosis due to unclear cause in process of waiting to receive a liver transplant and esophageal varices. She is here today for questions regarding her current medications.   Hepatic Cirrhosis & Esophageal Varices  Reviewed all of medications with patient and explained that nadolol is for treating her varices. Educated patient about cirrhosis and complications. The rash patient described was not seen on exam but explained it is likely due to poor liver function. Advised pt to call back if symptoms worsen or if she has any further questions.   Prediabetes Recent A1C screen was 5.7. Counseled patient on importance of low sugar diet and daily exercise to prevent diabetes.      Patient was seen with medical student Agree with above assessment and plan.  Doris Cheadleeepak Advani, MD   Return in about 4 months (around 05/06/2015), or if symptoms worsen or fail to improve.

## 2015-01-03 NOTE — Progress Notes (Signed)
Patient here for follow up on her liver disease Patient as seen at [facility] on 5/2 [facility] Patient was told she will most likely need a liver transplant

## 2015-01-04 NOTE — Telephone Encounter (Signed)
[facility]

## 2015-01-04 NOTE — Telephone Encounter (Signed)
Dr Arlyce DiceKaplan, Just heard back from [facility], They do not accept the patients orange card. She only has the [facility] 100% coverage.  She has No Insurance

## 2015-01-08 NOTE — Telephone Encounter (Signed)
Called [facility]. They do accept patients without insurance. They are faxing the referral form today

## 2015-01-08 NOTE — Telephone Encounter (Signed)
Dr Arlyce DiceKaplan, Is this for a Liver Transplant?? Or just Liver Care    Is the cirrhosis alcohol related ??

## 2015-01-08 NOTE — Telephone Encounter (Signed)
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  No h/o EtoH

## 2015-01-11 NOTE — Telephone Encounter (Signed)
RESENT ALL RECORDS TO [facility]

## 2015-02-08 NOTE — Telephone Encounter (Signed)
[facility], They are 30 days out on scheduling.   Called patient and informed her to contact them at [phone] to schedule. They have all her information

## 2015-02-27 NOTE — Telephone Encounter (Signed)
Patient has appointment with [facility] tomorrow on 02/28/2015

## 2015-03-11 ENCOUNTER — Ambulatory Visit: Payer: Self-pay | Attending: Internal Medicine | Admitting: Internal Medicine

## 2015-03-11 ENCOUNTER — Encounter: Payer: Self-pay | Admitting: Internal Medicine

## 2015-03-11 VITALS — BP 108/67 | HR 57 | Temp 98.0°F | Resp 16 | Wt 165.4 lb

## 2015-03-11 DIAGNOSIS — I8500 Esophageal varices without bleeding: Secondary | ICD-10-CM | POA: Insufficient documentation

## 2015-03-11 DIAGNOSIS — K7460 Unspecified cirrhosis of liver: Secondary | ICD-10-CM | POA: Insufficient documentation

## 2015-03-11 DIAGNOSIS — E559 Vitamin D deficiency, unspecified: Secondary | ICD-10-CM | POA: Insufficient documentation

## 2015-03-11 DIAGNOSIS — R600 Localized edema: Secondary | ICD-10-CM | POA: Insufficient documentation

## 2015-03-11 DIAGNOSIS — R7301 Impaired fasting glucose: Secondary | ICD-10-CM | POA: Insufficient documentation

## 2015-03-11 LAB — COMPLETE METABOLIC PANEL WITH GFR
ALBUMIN: 3 g/dL — AB (ref 3.5–5.2)
ALT: 46 U/L — ABNORMAL HIGH (ref 0–35)
AST: 57 U/L — AB (ref 0–37)
Alkaline Phosphatase: 108 U/L (ref 39–117)
BUN: 7 mg/dL (ref 6–23)
CALCIUM: 8.1 mg/dL — AB (ref 8.4–10.5)
CO2: 25 mEq/L (ref 19–32)
Chloride: 108 mEq/L (ref 96–112)
Creat: 0.53 mg/dL (ref 0.50–1.10)
Glucose, Bld: 92 mg/dL (ref 70–99)
POTASSIUM: 4.2 meq/L (ref 3.5–5.3)
Sodium: 139 mEq/L (ref 135–145)
TOTAL PROTEIN: 5.6 g/dL — AB (ref 6.0–8.3)
Total Bilirubin: 0.7 mg/dL (ref 0.2–1.2)

## 2015-03-11 NOTE — Progress Notes (Signed)
MRN: [id number] Name: Yolanda Guzman  Sex: female Age: 39 y.o. DOB: August 01, 1976  Allergies: Penicillins  Chief Complaint  Patient presents with  . Follow-up    HPI: Patient is 39 y.o. female who history of cirrhosis, ascites,  Esophageal varices currently following up with GI, has been taking her Lasix, as per rectum as well as nadolol, she is undergoing hepatitis B vaccination series, 2-3 weeks ago patient noticed puffiness in her face as well as mild swelling in her legs patient denies any chest and shortness of breath denies any orthopnea PND , as per patient she has been taking her medications. Previous blood work reviewed with the patient found to have low vitamin D as per patient she is taking vitamin D supplement, also has impaired fasting glucose.  Past Medical History  Diagnosis Date  . Hepatic cirrhosis     Past Surgical History  Procedure Laterality Date  . Cesarean section        Medication List       This list is accurate as of: 03/11/15 12:25 PM.  Always use your most recent med list.               furosemide 20 MG tablet  Commonly known as:  LASIX  Take 20 mg by mouth.     nadolol 40 MG tablet  Commonly known as:  CORGARD  Take 1 tablet (40 mg total) by mouth daily.     spironolactone 25 MG tablet  Commonly known as:  ALDACTONE  Take 25 mg by mouth daily.     Vitamin D (Ergocalciferol) 50000 UNITS Caps capsule  Commonly known as:  DRISDOL  Take 1 capsule (50,000 Units total) by mouth every 7 (seven) days.        No orders of the defined types were placed in this encounter.    Immunization History  Administered Date(s) Administered  . Hepatitis B, ped/adol 11/07/2014, 12/10/2014    Family History  Problem Relation Age of Onset  . Hypertension Mother   . Diabetes Father     History  Substance Use Topics  . Smoking status: Never Smoker   . Smokeless tobacco: Never Used  . Alcohol Use: No    Review of Systems   As noted in  HPI  Filed Vitals:   03/11/15 1201  BP: 108/67  Pulse: 57  Temp: 98 F (36.7 C)  Resp: 16    Physical Exam  Physical Exam  Constitutional: No distress.  Eyes: EOM are normal. Pupils are equal, round, and reactive to light.  Cardiovascular: Normal rate and regular rhythm.   Pulmonary/Chest: Breath sounds normal. No respiratory distress. She has no wheezes. She has no rales.  Abdominal: Soft. There is no tenderness. There is no rebound.  Musculoskeletal:  Trace ankle edema    CBC    Component Value Date/Time   WBC 3.6* 10/26/2014 1128   RBC 4.58 10/26/2014 1128   HGB 12.1 10/26/2014 1128   HCT 35.6* 10/26/2014 1128   PLT 74* 10/26/2014 1128   MCV 77.7* 10/26/2014 1128   LYMPHSABS 0.9 10/26/2014 1128   MONOABS 0.3 10/26/2014 1128   EOSABS 0.1 10/26/2014 1128   BASOSABS 0.0 10/26/2014 1128    CMP     Component Value Date/Time   NA 136 12/03/2014 1615   K 4.1 12/03/2014 1615   CL 105 12/03/2014 1615   CO2 28 12/03/2014 1615   GLUCOSE 172* 12/03/2014 1615   BUN 7 12/03/2014 1615   CREATININE  0.51 12/03/2014 1615   CREATININE 0.61 10/26/2014 1128   CALCIUM 7.8* 12/03/2014 1615   PROT 5.4* 12/03/2014 1615   ALBUMIN 3.0* 12/03/2014 1615   AST 42* 12/03/2014 1615   ALT 33 12/03/2014 1615   ALKPHOS 98 12/03/2014 1615   BILITOT 0.7 12/03/2014 1615   GFRNONAA >89 12/03/2014 1615   GFRNONAA >90 10/26/2014 1128   GFRAA >89 12/03/2014 1615   GFRAA >90 10/26/2014 1128    No results found for: CHOL  Lab Results  Component Value Date/Time   HGBA1C 5.7* 12/03/2014 04:15 PM    Lab Results  Component Value Date/Time   AST 42* 12/03/2014 04:15 PM    Assessment and Plan  Hepatic cirrhosis, unspecified hepatic cirrhosis type - Plan: currently patient is following up with her GI, getting hepatitis B vaccinations, also patient is a process to get liver biopsy done . Continue Lasix, spironolactone COMPLETE METABOLIC PANEL WITH GFR  Esophageal varices Continue with  nadolol   Vitamin D deficiency Current vitamin D supplement  IFG (impaired fasting glucose) - Plan: advised patient for low carbohydrate diet, repeat blood history COMPLETE METABOLIC PANEL WITH GFR  Pedal edema - Plan: Patient has trace pedal edema, advise patient follow salt diet, leg elevation ,continue Lasix, spironolactone  repeat blood chemistryCOMPLETE METABOLIC PANEL WITH GFR    Return in about 3 months (around 06/11/2015), or if symptoms worsen or fail to improve.   This note has been created with Education officer, environmentalDragon speech recognition software and smart phrase technology. Any transcriptional errors are unintentional.    Doris CheadleADVANI, Tiwana Chavis, MD

## 2015-03-11 NOTE — Progress Notes (Signed)
Patient complains of having some swelling to her face and lower legs Her face has been swollen with some puffiness Patient states this has happened three times in the past week or so

## 2015-03-12 ENCOUNTER — Telehealth: Payer: Self-pay

## 2015-03-12 NOTE — Telephone Encounter (Signed)
In house interpreter used Patient is aware of her lab results 

## 2015-03-12 NOTE — Telephone Encounter (Signed)
-----   Message from Doris Cheadleeepak Advani, MD sent at 03/12/2015 11:46 AM EDT ----- Call and let the patient know that  her blood work shows slightly worsening liver enzymes elevation, continue to follow with GI will repeat blood chemistry on the following visit.

## 2015-03-13 ENCOUNTER — Other Ambulatory Visit (HOSPITAL_COMMUNITY): Payer: Self-pay | Admitting: Gastroenterology

## 2015-03-13 DIAGNOSIS — K7469 Other cirrhosis of liver: Secondary | ICD-10-CM

## 2015-03-18 ENCOUNTER — Telehealth (HOSPITAL_COMMUNITY): Payer: Self-pay

## 2015-03-18 NOTE — Telephone Encounter (Signed)
Called pt to remind her of 1:30pm appt at [facility]... Gave pt appt info and she agreed to stay npo 6 hrs prior in prep for exam. AW

## 2015-03-19 ENCOUNTER — Ambulatory Visit (HOSPITAL_COMMUNITY)
Admission: RE | Admit: 2015-03-19 | Discharge: 2015-03-19 | Disposition: A | Discharge status: Home / Self Care | Payer: Self-pay | Source: Ambulatory Visit | Attending: Gastroenterology | Admitting: Gastroenterology

## 2015-03-19 ENCOUNTER — Ambulatory Visit (HOSPITAL_COMMUNITY): Payer: Self-pay

## 2015-03-19 DIAGNOSIS — K7460 Unspecified cirrhosis of liver: Secondary | ICD-10-CM | POA: Insufficient documentation

## 2015-03-19 DIAGNOSIS — K7469 Other cirrhosis of liver: Secondary | ICD-10-CM

## 2015-03-19 DIAGNOSIS — R161 Splenomegaly, not elsewhere classified: Secondary | ICD-10-CM | POA: Insufficient documentation

## 2015-03-25 HISTORY — PX: LIVER BIOPSY: SHX301

## 2015-03-28 ENCOUNTER — Other Ambulatory Visit: Payer: Self-pay | Admitting: Gastroenterology

## 2015-03-28 ENCOUNTER — Other Ambulatory Visit: Payer: Self-pay | Admitting: Family Medicine

## 2015-03-29 LAB — FERRITIN: FERRITIN: 12 ng/mL (ref 10–291)

## 2015-03-29 LAB — ANA: ANA: NEGATIVE

## 2015-03-29 LAB — PROTIME-INR
INR: 1.55 — ABNORMAL HIGH (ref <1.50)
Prothrombin Time: 18.6 seconds — ABNORMAL HIGH (ref 11.6–15.2)

## 2015-03-29 LAB — HEPATITIS B CORE ANTIBODY, TOTAL: Hep B Core Total Ab: NONREACTIVE

## 2015-03-29 LAB — IGM: IGM, SERUM: 103 mg/dL (ref 52–322)

## 2015-03-29 LAB — IGA: IgA: 193 mg/dL (ref 69–380)

## 2015-03-29 LAB — IGG: IgG (Immunoglobin G), Serum: 1250 mg/dL (ref 690–1700)

## 2015-03-30 LAB — BASIC METABOLIC PANEL
BUN: 8 mg/dL (ref 7–25)
CHLORIDE: 105 mmol/L (ref 98–110)
CO2: 23 mmol/L (ref 20–31)
Calcium: 8.1 mg/dL — ABNORMAL LOW (ref 8.6–10.4)
Creat: 0.52 mg/dL — ABNORMAL LOW (ref 0.60–0.88)
GLUCOSE: 90 mg/dL (ref 65–99)
POTASSIUM: 3.8 mmol/L (ref 3.5–5.3)
SODIUM: 138 mmol/L (ref 135–146)

## 2015-03-30 LAB — IRON AND TIBC
%SAT: 26 % (ref 20–55)
Iron: 99 ug/dL (ref 42–145)
TIBC: 376 ug/dL (ref 250–470)
UIBC: 277 ug/dL (ref 125–400)

## 2015-03-30 LAB — GAMMA GT: GGT: 47 U/L (ref 7–51)

## 2015-03-30 LAB — BILIRUBIN, TOTAL: BILIRUBIN TOTAL: 0.9 mg/dL (ref 0.2–1.2)

## 2015-03-30 LAB — ALT: ALT: 39 U/L — AB (ref 6–29)

## 2015-03-30 LAB — ALKALINE PHOSPHATASE: ALK PHOS: 82 U/L (ref 33–130)

## 2015-03-30 LAB — ALBUMIN: Albumin: 2.8 g/dL — ABNORMAL LOW (ref 3.6–5.1)

## 2015-03-30 LAB — AST: AST: 45 U/L — ABNORMAL HIGH (ref 10–35)

## 2015-04-01 ENCOUNTER — Telehealth: Payer: Self-pay

## 2015-04-01 LAB — ANTI-MICROSOMAL ANTIBODY LIVER / KIDNEY

## 2015-04-01 LAB — ANGIOTENSIN CONVERTING ENZYME

## 2015-04-01 LAB — CERULOPLASMIN: Ceruloplasmin: 25 mg/dL

## 2015-04-01 LAB — ALPHA-1-ANTITRYPSIN: A1 ANTITRYPSIN SER: 137 mg/dL (ref 83–199)

## 2015-04-01 NOTE — Telephone Encounter (Signed)
Yolanda Guzman from [facility] [place] [facility] called They were unable to run a test ordered by Dr Arlyce Dice office Instructed her to call their office to let them know patient will need To have test re drawn

## 2015-04-02 ENCOUNTER — Ambulatory Visit: Payer: Self-pay | Attending: Internal Medicine

## 2015-04-02 LAB — MITOCHONDRIAL ANTIBODIES: Mitochondrial M2 Ab, IgG: 0.59 (ref <0.91)

## 2015-04-02 LAB — ANTI-SMOOTH MUSCLE ANTIBODY, IGG: SMOOTH MUSCLE AB: 17 U (ref <20)

## 2015-04-19 ENCOUNTER — Other Ambulatory Visit: Payer: Self-pay | Admitting: *Deleted

## 2015-04-19 MED ORDER — NADOLOL 40 MG PO TABS: 40.0000 mg | ORAL_TABLET | Freq: Every day | ORAL | Status: DC

## 2015-04-19 NOTE — Telephone Encounter (Signed)
90 day refill request from [facility]. Approved and sent in electronically

## 2015-06-17 ENCOUNTER — Telehealth: Payer: Self-pay | Admitting: Internal Medicine

## 2015-06-17 MED ORDER — FUROSEMIDE 20 MG PO TABS: 20.0000 mg | ORAL_TABLET | Freq: Every day | ORAL | Status: DC

## 2015-06-17 NOTE — Telephone Encounter (Signed)
Patient came in requesting a medication refill for furosemide (LASIX)

## 2015-06-20 ENCOUNTER — Encounter: Payer: Self-pay | Admitting: Internal Medicine

## 2015-06-20 ENCOUNTER — Ambulatory Visit: Payer: Self-pay | Attending: Internal Medicine | Admitting: Internal Medicine

## 2015-06-20 VITALS — BP 105/68 | HR 58 | Temp 98.0°F | Resp 16 | Ht 63.0 in | Wt 168.0 lb

## 2015-06-20 DIAGNOSIS — Z8249 Family history of ischemic heart disease and other diseases of the circulatory system: Secondary | ICD-10-CM | POA: Insufficient documentation

## 2015-06-20 DIAGNOSIS — Z23 Encounter for immunization: Secondary | ICD-10-CM | POA: Insufficient documentation

## 2015-06-20 DIAGNOSIS — Z79899 Other long term (current) drug therapy: Secondary | ICD-10-CM | POA: Insufficient documentation

## 2015-06-20 DIAGNOSIS — H538 Other visual disturbances: Secondary | ICD-10-CM | POA: Insufficient documentation

## 2015-06-20 DIAGNOSIS — Z880 Allergy status to penicillin: Secondary | ICD-10-CM | POA: Insufficient documentation

## 2015-06-20 DIAGNOSIS — K7460 Unspecified cirrhosis of liver: Secondary | ICD-10-CM | POA: Insufficient documentation

## 2015-06-20 DIAGNOSIS — R079 Chest pain, unspecified: Secondary | ICD-10-CM | POA: Insufficient documentation

## 2015-06-20 DIAGNOSIS — Z833 Family history of diabetes mellitus: Secondary | ICD-10-CM | POA: Insufficient documentation

## 2015-06-20 MED ORDER — SPIRONOLACTONE 25 MG PO TABS: 25.0000 mg | ORAL_TABLET | Freq: Every day | ORAL | Status: DC

## 2015-06-20 MED ORDER — FUROSEMIDE 20 MG PO TABS: 20.0000 mg | ORAL_TABLET | Freq: Every day | ORAL | Status: DC

## 2015-06-20 NOTE — Progress Notes (Signed)
Patient complains of bilateral edema to her lower legs Patient has been our of her lasix for about a week and a half But states this has been happening before she ran our of her medicine

## 2015-06-20 NOTE — Progress Notes (Signed)
Patient ID: Yolanda Guzman, female   DOB: July 15, 1976, 39 y.o.   MRN: [id number]  CC: edema  HPI: Yolanda Guzman is a 39 y.o. female here today for a follow up visit.  Patient has past medical history of hepatic cirrhosis and esophageal varices. Patient reports that she has been out of her Lasix for over 1 week and has noticed more edema in BLE. She reports that although she has been out of her Lasix she had gradually increase in edema before running out. She currently takes Aldactone in combination with Lasix. She is being followed by [facility] current in hopes to have a liver transplant eventually. She states that her PCP in [place] was writing her refills for Aldactone.   She occasionally has a sharp chest pain on the left side. Pain does not radiate. Pain is not associated with exertion. She did not try anything for pain. No coughing or SOB. She has this pain about twice monthly. Denies acid reflux.  Allergies  Allergen Reactions  . Penicillins    Past Medical History  Diagnosis Date  . Hepatic cirrhosis (HCC)    Current Outpatient Prescriptions on File Prior to Visit  Medication Sig Dispense Refill  . furosemide (LASIX) 20 MG tablet Take 1 tablet (20 mg total) by mouth daily. 30 tablet 0  . nadolol (CORGARD) 40 MG tablet Take 1 tablet (40 mg total) by mouth daily. 90 tablet 3  . spironolactone (ALDACTONE) 25 MG tablet Take 25 mg by mouth daily.    . Vitamin D, Ergocalciferol, (DRISDOL) 50000 UNITS CAPS capsule Take 1 capsule (50,000 Units total) by mouth every 7 (seven) days. 12 capsule 0   No current facility-administered medications on file prior to visit.   Family History  Problem Relation Age of Onset  . Hypertension Mother   . Diabetes Father    Social History   Social History  . Marital Status: Married    Spouse Name: N/A  . Number of Children: 2  . Years of Education: N/A   Occupational History  . Not on file.   Social History Main Topics  . Smoking status:  Never Smoker   . Smokeless tobacco: Never Used  . Alcohol Use: No  . Drug Use: No  . Sexual Activity: Yes   Other Topics Concern  . Not on file   Social History Narrative    Review of Systems: Other than what is stated in HPI, all other systems are negative.   Objective:   Filed Vitals:   06/20/15 1615  BP: 105/68  Pulse: 58  Temp: 98 F (36.7 C)  Resp: 16    Physical Exam  Eyes: EOM are normal. Pupils are equal, round, and reactive to light. Right eye exhibits no discharge. Left eye exhibits no discharge. No scleral icterus.  Cardiovascular: Normal rate, regular rhythm and normal heart sounds.   Pulmonary/Chest: Effort normal and breath sounds normal. She has no wheezes. She exhibits no tenderness.  Abdominal: She exhibits distension. She exhibits no mass. There is no tenderness.  Skin: Skin is warm and dry.  Psychiatric: She has a normal mood and affect.     Lab Results  Component Value Date   WBC 3.6* 10/26/2014   HGB 12.1 10/26/2014   HCT 35.6* 10/26/2014   MCV 77.7* 10/26/2014   PLT 74* 10/26/2014   Lab Results  Component Value Date   CREATININE 0.52* 03/28/2015   BUN 8 03/28/2015   NA 138 03/28/2015   K 3.8  03/28/2015   CL 105 03/28/2015   CO2 23 03/28/2015    Lab Results  Component Value Date   HGBA1C 5.7* 12/03/2014   Lipid Panel  No results found for: CHOL, TRIG, HDL, CHOLHDL, VLDL, LDLCALC     Assessment and plan:   Beonka was seen today for follow-up.  Diagnoses and all orders for this visit:  Chest pain, unspecified chest pain type -     Basic Metabolic Panel -     EKG 12-Lead EKG: normal EKG, normal sinus rhythm Unsure of etiology. Will continue to monitor. Explained signs and symptoms that should warrant immediate attention.  Patient verbalized understanding with teach back used.  Cirrhosis of liver without ascites, unspecified hepatic cirrhosis type (HCC) -     furosemide (LASIX) 20 MG tablet; Take 1 tablet (20 mg total) by  mouth daily. -     spironolactone (ALDACTONE) 25 MG tablet; Take 1 tablet (25 mg total) by mouth daily. Stable, [facility]  Blurred vision, bilateral -     Ambulatory referral to Ophthalmology  Need for influenza vaccination -     Flu Vaccine QUAD 36+ mos PF IM (Fluarix & Fluzone Quad PF)   Return in about 3 months (around 09/20/2015).    Ambrose FinlandValerie A Keck, NP-C [facility] [phone] 06/20/2015, 4:30 PM

## 2015-06-21 LAB — BASIC METABOLIC PANEL
BUN: 8 mg/dL (ref 7–25)
CHLORIDE: 104 mmol/L (ref 98–110)
CO2: 26 mmol/L (ref 20–31)
CREATININE: 0.53 mg/dL (ref 0.50–1.10)
Calcium: 8.1 mg/dL — ABNORMAL LOW (ref 8.6–10.2)
Glucose, Bld: 110 mg/dL — ABNORMAL HIGH (ref 65–99)
Potassium: 3.8 mmol/L (ref 3.5–5.3)
Sodium: 136 mmol/L (ref 135–146)

## 2015-07-16 ENCOUNTER — Telehealth: Payer: Self-pay

## 2015-07-16 NOTE — Telephone Encounter (Signed)
Spoke with patient and she is aware of her lab results 

## 2015-07-16 NOTE — Telephone Encounter (Signed)
-----   Message from Ambrose FinlandValerie A Keck, NP sent at 07/15/2015  6:16 PM EST ----- Get some OTC calcium pills like Caltrate. Her level is low. This is essential for bone health

## 2015-08-05 ENCOUNTER — Ambulatory Visit (INDEPENDENT_AMBULATORY_CARE_PROVIDER_SITE_OTHER): Payer: Self-pay | Admitting: Gastroenterology

## 2015-08-05 ENCOUNTER — Encounter: Payer: Self-pay | Admitting: Gastroenterology

## 2015-08-05 DIAGNOSIS — Z23 Encounter for immunization: Secondary | ICD-10-CM

## 2015-10-08 ENCOUNTER — Ambulatory Visit (INDEPENDENT_AMBULATORY_CARE_PROVIDER_SITE_OTHER): Payer: Medicaid Other | Admitting: Gastroenterology

## 2015-10-08 ENCOUNTER — Encounter: Payer: Self-pay | Admitting: Gastroenterology

## 2015-10-08 ENCOUNTER — Other Ambulatory Visit (INDEPENDENT_AMBULATORY_CARE_PROVIDER_SITE_OTHER): Payer: Self-pay

## 2015-10-08 ENCOUNTER — Ambulatory Visit: Payer: Medicaid Other | Admitting: Gastroenterology

## 2015-10-08 VITALS — BP 110/66 | HR 72 | Ht 63.19 in | Wt 171.0 lb

## 2015-10-08 DIAGNOSIS — K219 Gastro-esophageal reflux disease without esophagitis: Secondary | ICD-10-CM

## 2015-10-08 DIAGNOSIS — K7460 Unspecified cirrhosis of liver: Secondary | ICD-10-CM

## 2015-10-08 LAB — CBC WITH DIFFERENTIAL/PLATELET
Basophils Absolute: 0 10*3/uL (ref 0.0–0.1)
Basophils Relative: 0.3 % (ref 0.0–3.0)
EOS PCT: 3 % (ref 0.0–5.0)
Eosinophils Absolute: 0.1 10*3/uL (ref 0.0–0.7)
HEMATOCRIT: 39.1 % (ref 36.0–46.0)
HEMOGLOBIN: 13.5 g/dL (ref 12.0–15.0)
LYMPHS PCT: 30.7 % (ref 12.0–46.0)
Lymphs Abs: 0.8 10*3/uL (ref 0.7–4.0)
MCHC: 34.5 g/dL (ref 30.0–36.0)
MCV: 87.1 fl (ref 78.0–100.0)
MONOS PCT: 8.3 % (ref 3.0–12.0)
Monocytes Absolute: 0.2 10*3/uL (ref 0.1–1.0)
Neutro Abs: 1.5 10*3/uL (ref 1.4–7.7)
Neutrophils Relative %: 57.7 % (ref 43.0–77.0)
Platelets: 48 10*3/uL — CL (ref 150.0–400.0)
RBC: 4.49 Mil/uL (ref 3.87–5.11)
RDW: 16.4 % — ABNORMAL HIGH (ref 11.5–15.5)
WBC: 2.7 10*3/uL — AB (ref 4.0–10.5)

## 2015-10-08 LAB — COMPREHENSIVE METABOLIC PANEL
ALBUMIN: 3.3 g/dL — AB (ref 3.5–5.2)
ALK PHOS: 108 U/L (ref 39–117)
ALT: 130 U/L — ABNORMAL HIGH (ref 0–35)
AST: 162 U/L — ABNORMAL HIGH (ref 0–37)
BUN: 7 mg/dL (ref 6–23)
CALCIUM: 8.1 mg/dL — AB (ref 8.4–10.5)
CO2: 27 mEq/L (ref 19–32)
Chloride: 108 mEq/L (ref 96–112)
Creatinine, Ser: 0.55 mg/dL (ref 0.40–1.20)
GFR: 130.57 mL/min (ref >60.00)
Glucose, Bld: 117 mg/dL — ABNORMAL HIGH (ref 70–99)
POTASSIUM: 3.9 meq/L (ref 3.5–5.1)
SODIUM: 141 meq/L (ref 135–145)
TOTAL PROTEIN: 6.2 g/dL (ref 6.0–8.3)
Total Bilirubin: 1.3 mg/dL — ABNORMAL HIGH (ref 0.2–1.2)

## 2015-10-08 LAB — PROTIME-INR
INR: 1.7 ratio — AB (ref 0.8–1.0)
Prothrombin Time: 18.1 s — ABNORMAL HIGH (ref 9.6–13.1)

## 2015-10-08 MED ORDER — OMEPRAZOLE 40 MG PO CPDR: 40.0000 mg | DELAYED_RELEASE_CAPSULE | Freq: Every day | ORAL | Status: DC

## 2015-10-08 NOTE — Progress Notes (Signed)
HPI :  40 y/o female, former patient of Dr. Arlyce Dice, here for follow up. She carries a diagnosis of cirrhosis since age 7 of unclear etiology. She was seen by Dr. Arlyce Dice last year who performed labs for chronic liver diseases which were negative. She had HCC screening at the time and an EGD showed esophageal varices and has been on nadolol since that time. She was referred to [facility] and had an evaluation by Dr. Jacqualine Mau who coordinated a liver biopsy which was done this past September. She reports she has not had a follow up visit since that time and never learned the results of her liver biopsy.   She has been stable since her last visit with [place] in general. She has some "pinching" discomfort which can bother her at night periodically in the RUQ. It is not related to eating and notices it only at night for the most part, and comes and goes. She thinks this will bother her 4 times per week or so. She has some nausea and rare vomiting with greasy foods, or if she eats too late. She has some borborygmi. She also has some hearburn which bothers her with some burning in her chest. She denies any exertional chest pains or shortness of breath. She does not take any medications for GERD. She denies any NSAID use. No blood in the stools. Her bowel habits are normal without any recent changes.   She takes lasix  daily and aldactone  daily for lower extremity edema and states she is unaware of a history of ascites. She has been on these for years from what she can recall and denies any signficant peripheral edema. She takes nadolol  daily for history of varices noted on prior EGD and tolerates it well  She has had an uncle with cirrhosis due to alcohol use. No other family history of liver disease. She denies any history of alcohol use.    Liver biopsy done this past summer at [facility].        Final Diagnosis  A: Liver, core biopsy  - Fragments of cirrhotic liver with large areas of parenchymal  extinction (see comment) Comment  The histologic changes do not point to a definitive etiology for the patient's cirrhosis. Although there is prominent periportal copper staining (a finding that can be nonspecific in the setting of cirrhosis), other clinical and histologic findings of chronic biliary disease do not appear to be present. Correlation with the clinical findings is necessary to determine the most likely etiology.  This case was shown in consultation to Dr. Micheline Rough, who agrees with the interpretation. Clinical History 40 year old woman with a clinical diagnosis of cirrhosis and mild transaminitis. Gross Description  Received is one appropriately labeled container.   Specimen A:  SITE:  "liver"  METHOD: Core needle biopsy  MEASURE: 6 x 6 x 2 mm  COMMENT: Multiple tan/brown core biopsies  BLOCK:   A1, NTR  (ak) Microscopic Description  Light microscopic examination is performed by Dr. Neva Seat.  The specimen consists of several fragments of liver tissue. There are large areas of fibrosis/parenchymal extinction. The portal tracts contain mild mixed inflammatory infiltrates. Plasma cells are not significantly increased in number. There is focal interface hepatitis. Interlobular bile ducts appear to be present in normal numbers and show no definitive histologic abnormalities. There are portal vein/venule changes suggestive of portal hypertension. The lobular parenchyma contains rare lymphocytic infiltrates. Granulomas, steatosis, and cholestasis are not identified.  A trichrome stain highlights bridging fibrosis,  focal nodular architecture, and large areas of parenchymal extinction. A reticulin stain shows no evidence of malignancy. An iron stain is negative. A PAS-D stain does not demonstrate large globular cytoplasmic inclusions. A copper stain shows positive staining within periportal hepatocytes   Past Medical History  Diagnosis Date  . Hepatic cirrhosis [facility])      Past  Surgical History  Procedure Laterality Date  . Cesarean section    . Liver biopsy  03-2015   Family History  Problem Relation Age of Onset  . Hypertension Mother   . Diabetes Father    Social History  Substance Use Topics  . Smoking status: Never Smoker   . Smokeless tobacco: Never Used  . Alcohol Use: No   Current Outpatient Prescriptions  Medication Sig Dispense Refill  . furosemide (LASIX) 20 MG tablet Take 1 tablet (20 mg total) by mouth daily. 30 tablet 3  . nadolol (CORGARD) 40 MG tablet Take 1 tablet (40 mg total) by mouth daily. 90 tablet 3  . spironolactone (ALDACTONE) 25 MG tablet Take 1 tablet (25 mg total) by mouth daily. 30 tablet 3  . Vitamin D, Ergocalciferol, (DRISDOL) 50000 UNITS CAPS capsule Take 1 capsule (50,000 Units total) by mouth every 7 (seven) days. 12 capsule 0   No current facility-administered medications for this visit.   Allergies  Allergen Reactions  . Penicillins      Review of Systems: All systems reviewed and negative except where noted in HPI.   Lab Results  Component Value Date   WBC 3.6* 10/26/2014   HGB 12.1 10/26/2014   HCT 35.6* 10/26/2014   MCV 77.7* 10/26/2014   PLT 74* 10/26/2014   Lab Results  Component Value Date   CREATININE 0.53 06/20/2015   BUN 8 06/20/2015   NA 136 06/20/2015   K 3.8 06/20/2015   CL 104 06/20/2015   CO2 26 06/20/2015    Lab Results  Component Value Date   ALT 39* 03/28/2015   AST 45* 03/28/2015   ALKPHOS 82 03/28/2015   BILITOT 0.9 03/28/2015   Lab Results  Component Value Date   INR 1.55* 03/28/2015     Physical Exam: BP 110/66 mmHg  Pulse 72  Ht 5' 3.19" (1.605 m)  Wt 171 lb (77.565 kg)  BMI 30.11 kg/m2  LMP 10/05/2015 (Exact Date) Constitutional: Pleasant,well-developed, female in no acute distress. HEENT: Normocephalic and atraumatic. Conjunctivae are normal. No scleral icterus. Neck supple.  Cardiovascular: Normal rate, regular rhythm.  Pulmonary/chest: Effort normal and  breath sounds normal. No wheezing, rales or rhonchi. Abdominal: Soft, nondistended, nontender. No obvious ascites. Bowel sounds active throughout. There are no masses palpable.  Extremities: no edema Lymphadenopathy: No cervical adenopathy noted. Neurological: Alert and oriented to person place and time. No asterixis Skin: Skin is warm and dry. No rashes noted. Psychiatric: Normal mood and affect. Behavior is normal.   ASSESSMENT AND PLAN: 40 y/o female with a history of cirrhosis of unclear etiology since age 27. She has esophageal varices which have not bled, but no history of jaundice, encephalopathy, or ascites reported. Labs for chronic liver disease were normal to include a normal ceruloplasmin. She was seen by [facility] hepatology and had a liver biopsy from September as outlined in the HPI above for which she has not had follow up. Per the liver biopsy report, they report they can't give a specific diagnosis based on the findings, although Wilson's would seem to be in the differential. I advised her she needs to follow up  with Dr. Jacqualine Mau to discuss this liver biopsy result as he can directly speak with their pathologists to clarify these findings. I would consider 24 HR urine copper and slit lamp exam of the eyes but defer to Dr. Jacqualine Mau to see if this is warranted. In the interim, recommend the following as outlined:  -CBC, CMET, INR, and AFP today -due for [facility] screening, will obtain US abdomen -start omeprazole  daily for suspected GERD causing her symptoms -continue diuretics, await labs, consider titration based on her course but she has no edema today -continue nadolol. No further screening for varices is needed given she is on beta blocker -she is immune to hepatitis A, and recently vaccines to hepatitis B -we will help coordinate a follow up with [facility] Hepatology  I will contact patient with results of labs and [place]. She agreed.   Ileene Patrick, MD [facility] Pager  [phone]  CC: Ambrose Finland, NP

## 2015-10-08 NOTE — Patient Instructions (Addendum)
Your physician has requested that you go to the basement for lab work before leaving today.  You have been scheduled for an abdominal ultrasound at [facility] Radiology (1st floor of hospital) on 10/11/2015 at 11:30am. Please arrive 15 minutes prior to your appointment for registration. Make certain not to have anything to eat or drink 6 hours prior to your appointment. Should you need to reschedule your appointment, please contact radiology at [phone]. This test typically takes about 30 minutes to perform.   We have sent the following medications to your pharmacy for you to pick up at your convenience: Omeprazole .

## 2015-10-09 LAB — AFP TUMOR MARKER: AFP TUMOR MARKER: 3.1 ng/mL (ref <6.1)

## 2015-10-11 ENCOUNTER — Ambulatory Visit (HOSPITAL_COMMUNITY)
Admission: RE | Admit: 2015-10-11 | Discharge: 2015-10-11 | Disposition: A | Discharge status: Home / Self Care | Payer: Self-pay | Source: Ambulatory Visit | Attending: Gastroenterology | Admitting: Gastroenterology

## 2015-10-11 DIAGNOSIS — K7460 Unspecified cirrhosis of liver: Secondary | ICD-10-CM | POA: Insufficient documentation

## 2015-10-14 ENCOUNTER — Other Ambulatory Visit: Payer: Self-pay | Admitting: *Deleted

## 2015-10-14 DIAGNOSIS — K7460 Unspecified cirrhosis of liver: Secondary | ICD-10-CM

## 2015-10-16 ENCOUNTER — Other Ambulatory Visit: Payer: Self-pay

## 2015-10-16 DIAGNOSIS — K7460 Unspecified cirrhosis of liver: Secondary | ICD-10-CM

## 2015-10-23 ENCOUNTER — Other Ambulatory Visit: Payer: Self-pay

## 2015-10-23 DIAGNOSIS — K7469 Other cirrhosis of liver: Secondary | ICD-10-CM

## 2015-10-23 DIAGNOSIS — K219 Gastro-esophageal reflux disease without esophagitis: Secondary | ICD-10-CM

## 2015-10-23 DIAGNOSIS — R609 Edema, unspecified: Secondary | ICD-10-CM

## 2015-10-23 LAB — COPPER, URINE, 24 HOUR

## 2015-11-11 ENCOUNTER — Telehealth: Payer: Self-pay

## 2015-11-11 NOTE — Telephone Encounter (Signed)
24 copper urine was cancelled by [facility] lab because patient was given wrong specimen container. Called patient to inform her and to see if she can come by our lab to get the correct containers to put her specimen in. Patient states she does not have a car but will try to come by our lab this week.  Order is in Epic.

## 2015-11-13 ENCOUNTER — Telehealth: Payer: Self-pay

## 2015-11-13 ENCOUNTER — Telehealth: Payer: Self-pay | Admitting: Internal Medicine

## 2015-11-13 NOTE — Telephone Encounter (Signed)
[facility] to obtain verbal for lasix #90 Authorization given

## 2015-11-13 NOTE — Telephone Encounter (Signed)
Pharmacy called requesting a ninety day supply on patient medication Request denied patient needs to schedule and appointment

## 2015-11-14 ENCOUNTER — Other Ambulatory Visit: Payer: Self-pay

## 2015-11-14 DIAGNOSIS — K219 Gastro-esophageal reflux disease without esophagitis: Secondary | ICD-10-CM

## 2015-11-14 DIAGNOSIS — K7469 Other cirrhosis of liver: Secondary | ICD-10-CM

## 2015-11-14 DIAGNOSIS — R609 Edema, unspecified: Secondary | ICD-10-CM

## 2015-11-20 LAB — COPPER, URINE, 24 HOUR

## 2015-12-09 ENCOUNTER — Telehealth: Payer: Self-pay | Admitting: Gastroenterology

## 2015-12-09 NOTE — Telephone Encounter (Signed)
That should be okay. I'm not sure what labs they have ordered for her however, if we can clarify that. Not sure if it is in care everywhere or if we need to reach out to his [facility] at [facility]. Thanks

## 2015-12-09 NOTE — Telephone Encounter (Signed)
Patient is asking if she can have labs that Dr. Jacqualine MauZacks requested done here because her "orange card" is not accepted at the lab he uses. Please, advise.

## 2015-12-09 NOTE — Telephone Encounter (Signed)
Patient states she has already had to labs done at the hospital and does not need to do them now.

## 2016-01-22 ENCOUNTER — Ambulatory Visit: Payer: Self-pay

## 2016-03-16 ENCOUNTER — Ambulatory Visit: Payer: Self-pay | Attending: Internal Medicine | Admitting: Internal Medicine

## 2016-03-16 ENCOUNTER — Encounter: Payer: Self-pay | Admitting: Internal Medicine

## 2016-03-16 VITALS — BP 111/74 | HR 55 | Temp 98.4°F | Resp 16 | Wt 165.6 lb

## 2016-03-16 DIAGNOSIS — E559 Vitamin D deficiency, unspecified: Secondary | ICD-10-CM | POA: Insufficient documentation

## 2016-03-16 DIAGNOSIS — Z114 Encounter for screening for human immunodeficiency virus [HIV]: Secondary | ICD-10-CM

## 2016-03-16 DIAGNOSIS — Z131 Encounter for screening for diabetes mellitus: Secondary | ICD-10-CM

## 2016-03-16 DIAGNOSIS — Z79899 Other long term (current) drug therapy: Secondary | ICD-10-CM | POA: Insufficient documentation

## 2016-03-16 DIAGNOSIS — K754 Autoimmune hepatitis: Secondary | ICD-10-CM | POA: Insufficient documentation

## 2016-03-16 DIAGNOSIS — K7469 Other cirrhosis of liver: Secondary | ICD-10-CM | POA: Insufficient documentation

## 2016-03-16 DIAGNOSIS — Z880 Allergy status to penicillin: Secondary | ICD-10-CM | POA: Insufficient documentation

## 2016-03-16 DIAGNOSIS — K7460 Unspecified cirrhosis of liver: Secondary | ICD-10-CM

## 2016-03-16 LAB — CBC WITH DIFFERENTIAL/PLATELET
Basophils Absolute: 16 cells/uL (ref 0–200)
Basophils Relative: 1 %
EOS ABS: 96 {cells}/uL (ref 15–500)
Eosinophils Relative: 6 %
HEMATOCRIT: 33.6 % — AB (ref 35.0–45.0)
HEMOGLOBIN: 11.4 g/dL — AB (ref 11.7–15.5)
LYMPHS PCT: 34 %
Lymphs Abs: 544 cells/uL — ABNORMAL LOW (ref 850–3900)
MCH: 35 pg — ABNORMAL HIGH (ref 27.0–33.0)
MCHC: 33.9 g/dL (ref 32.0–36.0)
MCV: 103.1 fL — AB (ref 80.0–100.0)
MONO ABS: 80 {cells}/uL — AB (ref 200–950)
Monocytes Relative: 5 %
Neutro Abs: 864 cells/uL — ABNORMAL LOW (ref 1500–7800)
Neutrophils Relative %: 54 %
Platelets: 43 10*3/uL — ABNORMAL LOW (ref 140–400)
RBC: 3.26 MIL/uL — AB (ref 3.80–5.10)
RDW: 16.5 % — AB (ref 11.0–15.0)
WBC: 1.6 10*3/uL — AB (ref 3.8–10.8)

## 2016-03-16 LAB — CMP AND LIVER
ALBUMIN: 3 g/dL — AB (ref 3.6–5.1)
ALK PHOS: 83 U/L (ref 33–115)
ALT: 21 U/L (ref 6–29)
AST: 34 U/L — ABNORMAL HIGH (ref 10–30)
BILIRUBIN DIRECT: 0.3 mg/dL — AB (ref <=0.2)
BILIRUBIN TOTAL: 1.4 mg/dL — AB (ref 0.2–1.2)
BUN: 7 mg/dL (ref 7–25)
CALCIUM: 7.6 mg/dL — AB (ref 8.6–10.2)
CHLORIDE: 106 mmol/L (ref 98–110)
CO2: 26 mmol/L (ref 20–31)
CREATININE: 0.47 mg/dL — AB (ref 0.50–1.10)
Glucose, Bld: 200 mg/dL — ABNORMAL HIGH (ref 65–99)
Indirect Bilirubin: 1.1 mg/dL (ref 0.2–1.2)
Potassium: 3.4 mmol/L — ABNORMAL LOW (ref 3.5–5.3)
Sodium: 138 mmol/L (ref 135–146)
TOTAL PROTEIN: 5.2 g/dL — AB (ref 6.1–8.1)

## 2016-03-16 LAB — PROTIME-INR
INR: 1.4 — AB
PROTHROMBIN TIME: 15 s — AB (ref 9.0–11.5)

## 2016-03-16 LAB — APTT: aPTT: 30 s (ref 22–34)

## 2016-03-16 LAB — POCT GLYCOSYLATED HEMOGLOBIN (HGB A1C): Hemoglobin A1C: 5

## 2016-03-16 LAB — HIV ANTIBODY (ROUTINE TESTING W REFLEX): HIV 1&2 Ab, 4th Generation: NONREACTIVE

## 2016-03-16 NOTE — Progress Notes (Signed)
Yolanda Guzman, is a 40 y.o. female  GUR:[id number]  EGB:[id number]  DOB - [birth year]  CC:  Chief Complaint  Patient presents with  . Abdominal Pain    lower  . Skin Discoloration       HPI: Yolanda Guzman is a 40 y.o. female here today to establish medical care, w/ hx autoimmune hepatitis, last seen in clinic 05/2015.  Pt recently saw Dr Jacqualine Mau, at [facility] hepatology 02/13/16, and is currently on 125mg  azathioprine, spironolactone and lasix.  Of note, pt states her swelling is actually good today, can get worse.  She avoids salt, does not smoke or drink etoh/  She c/o of noticing dark skin pigmentation around her neck and bilat axilla.  Sometimes her hands look darker as well. Denies tingling/numbness.    Patient has No headache, No chest pain, No abdominal pain - No Nausea, No new weakness tingling or numbness, No Cough - SOB.    Review of Systems: Per HPI, o/w all systems reviewed and negative.   Allergies  Allergen Reactions  . Penicillins    Past Medical History:  Diagnosis Date  . Hepatic cirrhosis (HCC)    Current Outpatient Prescriptions on File Prior to Visit  Medication Sig Dispense Refill  . furosemide (LASIX) 20 MG tablet Take 1 tablet (20 mg total) by mouth daily. 30 tablet 3  . nadolol (CORGARD) 40 MG tablet Take 1 tablet (40 mg total) by mouth daily. 90 tablet 3  . omeprazole (PRILOSEC) 40 MG capsule Take 1 capsule (40 mg total) by mouth daily. 90 capsule 3  . spironolactone (ALDACTONE) 25 MG tablet Take 1 tablet (25 mg total) by mouth daily. 30 tablet 3  . Vitamin D, Ergocalciferol, (DRISDOL) 50000 UNITS CAPS capsule Take 1 capsule (50,000 Units total) by mouth every 7 (seven) days. 12 capsule 0   No current facility-administered medications on file prior to visit.    Family History  Problem Relation Age of Onset  . Hypertension Mother   . Diabetes Father    Social History   Social History  . Marital status: Married    Spouse name: N/A  . Number of  children: 2  . Years of education: N/A   Occupational History  . Not on file.   Social History Main Topics  . Smoking status: Never Smoker  . Smokeless tobacco: Never Used  . Alcohol use No  . Drug use: No  . Sexual activity: Yes   Other Topics Concern  . Not on file   Social History Narrative  . No narrative on file    Objective:   Vitals:   03/16/16 1018  BP: 111/74  Pulse: (!) 55  Resp: 16  Temp: 98.4 F (36.9 C)    Filed Weights   03/16/16 1018  Weight: 165 lb 9.6 oz (75.1 kg)    BP Readings from Last 3 Encounters:  03/16/16 111/74  10/08/15 110/66  06/20/15 105/68    Physical Exam: Constitutional: Patient appears well-developed and well-nourished. No distress. AAOx3, obese, pleasant. HENT: Normocephalic, atraumatic, External right and left ear normal. Oropharynx is clear and moist.  Eyes: Conjunctivae and EOM are normal. PERRL, no scleral icterus. Neck: Normal ROM. Neck supple. No JVD. CVS: RRR, S1/S2 +, no murmurs, no gallops, no carotid bruit.  Pulmonary: Effort and breath sounds normal, no stridor, rhonchi, wheezes, rales.  Abdominal: Soft. BS +, no distension, tenderness, rebound or guarding.  Musculoskeletal: Normal range of motion. No edema and no tenderness.  LE: bilat/ no c/c/e,  pulses 2+ bilateral.  Neuro: Alert. muscle tone coordination wnl. No cranial nerve deficit grossly. Skin: Skin is warm and dry. No rash noted. Not diaphoretic. No erythema. No pallor.  Acanthosis nigricans noted around neck and bilat axilla. Psychiatric: Normal mood and affect. Behavior, judgment, thought content normal.  Lab Results  Component Value Date   WBC 2.7 (L) 10/08/2015   HGB 13.5 10/08/2015   HCT 39.1 10/08/2015   MCV 87.1 10/08/2015   PLT 48.0 (LL) 10/08/2015   Lab Results  Component Value Date   CREATININE 0.55 10/08/2015   BUN 7 10/08/2015   NA 141 10/08/2015   K 3.9 10/08/2015   CL 108 10/08/2015   CO2 27 10/08/2015    Lab Results    Component Value Date   HGBA1C 5.7 (H) 12/03/2014   Lipid Panel  No results found for: CHOL, TRIG, HDL, CHOLHDL, VLDL, LDLCALC     No flowsheet data found.  Assessment and plan:   1. Hepatic cirrhosis, unspecified hepatic cirrhosis type (HCC), w/ recent eval by DR Timothy Lasso at [facility] 02/13/16, and dx w. Autoimmune hepatitis. - pt sp bx in Sept, 2016 - has abd [place] pending Aug 2017 at [facility]. - currently on azathioprine  qd, lasix 20 qday, nadolol 40 qd, spironolactone 25 qday. - CBC with Differential - CMP and Liver - Protime-INR - APTT - low salt diet recd  2. Vitamin D deficiency, ho - Vitamin D, 25-hydroxy  3. Diabetes mellitus screening, w/ acanthosis nigricans on exam - POCT glycosylated hemoglobin (Hb A1C)  4. Encounter for screening for HIV - HIV antibody (with reflex)  5. Autoimmune hepatitis (HCC) See #1  6. Health maintenance - needs pap, currently on menses, asked her to make appt w/ me soon - tdap recd, she wants to chk 1st w/ her hepatologist.   Return in about 2 weeks (around 03/30/2016) for pap smear.  The patient was given clear instructions to go to ER or return to medical center if symptoms don't improve, worsen or new problems develop. The patient verbalized understanding. The patient was told to call to get lab results if they haven't heard anything in the next week.    This note has been created with Education officer, environmental. Any transcriptional errors are unintentional.   Pete Glatter, MD, MBA/MHA [facility] [place], [place] [phone]   03/16/2016, 10:38 AM

## 2016-03-16 NOTE — Patient Instructions (Signed)
Plan de alimentacin con bajo contenido de sodio (Low-Sodium Eating Plan) El sodio aumenta la presin arterial y hace que el cuerpo retenga lquidos. El consumo de alimentos con menos sodio ayuda a Conservator, museum/gallery presin arterial, a Building services engineer y a Physicist, medical, el hgado y los riones. Agregar sal (cloruro de sodio) a los alimentos aumenta el aporte de [place]. La mayor parte del sodio proviene de los alimentos enlatados, envasados y congelados. La pizza, la comida rpida y la comida de los restaurantes tambin contienen mucho sodio. Aunque usted tome medicamentos para bajar la presin arterial o reducir el lquido del cuerpo, es importante que disminuya el aporte de sodio de los alimentos. EN QU CONSISTE EL PLAN? La Harley-Davidson de las personas deberan limitar la ingesta de sodio a 2300mg  por Futures trader. El mdico le recomienda que limite su consumo de sodio a __________ Google.  QU DEBO SABER ACERCA DE ESTE PLAN DE ALIMENTACIN? Para el plan de alimentacin con bajo contenido de sodio, debe seguir estas pautas generales:  Elija alimentos con un valor porcentual diario de sodio de menos del 5% (segn se indica en la etiqueta).  Use hierbas o aderezos sin sal, en lugar de sal de mesa o sal marina.  Consulte al mdico o farmacutico antes de usar sustitutos de la sal.  Coma alimentos frescos.  Coma ms frutas y verduras.  Limite las verduras enlatadas. Si las consume, enjuguelas bien para disminuir el sodio.  Limite el consumo de queso a 1onza (28g) por Futures trader.  Coma productos con bajo contenido de sodio, cuya etiqueta suele decir "bajo contenido de sodio" o "sin agregado de sal".  Evite alimentos que contengan glutamato monosdico (MSG), que a veces se agrega a la comida [place] y a algunos alimentos enlatados.  Consulte las etiquetas de los alimentos (etiquetas de informacin nutricional) para saber cunto sodio contiene una porcin.  Consuma ms comida casera y menos de  restaurante, de buf y comida rpida.  Cuando coma en un restaurante, pida que preparen su comida con menos sal o, en lo posible, sin nada de sal. CMO LEO LA INFORMACIN SOBRE EL SODIO EN LAS ETIQUETAS DE LOS ALIMENTOS? La etiqueta de informacin nutricional indica la cantidad de sodio en una porcin de alimento. Si come ms de una porcin, debe multiplicar la cantidad indicada de sodio por la cantidad de porciones. Las etiquetas de los alimentos tambin pueden indicar lo siguiente:  Sin sodio: menos de 5mg  por porcin.  Cantidad muy baja de sodio: 35mg  o menos por porcin.  Cantidad baja de sodio: 140mg  o menos por porcin.  Menor cantidad de sodio: 50% menos de sodio en una porcin. Por ejemplo, si un alimento generalmente contiene 300 mg de sodio se modifica para ser [employer] International, tendr 150 mg de sodio.  Sodio reducido: 25% menos de Industrial/product designer. Por ejemplo, si un alimento que por lo general contiene 400mg  de sodio se modifica para convertirse en un alimento de sodio reducido, tendr 300mg  de sodio. QU ALIMENTOS PUEDO COMER? Cereales Cereales con bajo contenido de sodio, como [place], arroz y trigo [place], y trigo triturado. Galletas con bajo contenido de [place]. Arroz y pastas sin sal. Pan con bajo contenido de [address] [address].  Verduras Verduras frescas o congeladas. Verduras enlatadas con bajo contenido de sodio o reducido de sodio. Pasta y salsa de tomate con contenido bajo o reducido de sodio. Jugos de tomate y verduras con contenido bajo o reducido de sodio.  Nils Pyle Frutas frescas, congeladas  y enlatadas. Jugo de frutas.  Carnes y otros productos con protenas Atn y salmn enlatado con bajo contenido de [place]. Carne de vaca o ave, pescado y frutos de mar frescos o congelados. Cordero. Frutos secos sin sal. Lentejas, frijoles y guisantes secos, sin sal agregada. Frijoles enlatados sin sal. Sopas caseras sin sal. Huevos.  Lcteos Leche. Leche de soja. Queso  ricota. Quesos con contenido bajo o reducido de sodio. Yogur.  Condimentos Hierbas y especias frescas y secas. Aderezos sin sal. Cebolla y ajo en polvo. Variedades de mostaza y ketchup con bajo contenido de [place]. Rbano picante fresco o refrigerado. Jugo de limn.  Grasas y aceites Aderezos para ensalada con contenido reducido de [place]. Mantequilla sin sal.  Otros Palomitas de maz y pretzels sin sal.  Los artculos mencionados arriba pueden no ser [employer]theon de las bebidas o los alimentos recomendados. Comunquese con el nutricionista para conocer ms opciones. QU ALIMENTOS NO SE RECOMIENDAN? Cereales  Cereales instantneos para comer caliente. Mezclas para bizcochos, panqueques y rellenos de pan. Crutones. Mezclas para pastas o arroz con condimento. Envases comerciales de sopa de fideos. Macarrones con queso envasados o congelados. Harina leudante. Galletas saladas comunes. Verduras Verduras enlatadas comunes. Pasta y salsa de tomate en lata comunes. Jugos comunes de tomate y de verduras. Verduras Hydrologist. Papas fritas saladas. Aceitunas. Pepinillos. Salsas. Chucrut. Salsa. Carnes y otros productos con protenas Carne de vaca, pescado o frutos de mar que est salada, [place], [place], condimentada con especias o con pickles. Panceta, jamn, salchichas, perros calientes, carne curada, carne picada (carne envasada de buey) y embutidos. Cerdo salado. Cecina o charqui. Arenque en escabeche. Anchoas, atn enlatado comn y sardinas. Frutos secos con sal. Celine Mans para untar y quesos procesados. Requesn. Queso azul y cottage. Suero de [place].  Condimentos Sal de cebolla y ajo, sal condimentada, sal de mesa y sal marina. Salsas en lata y envasadas. Salsa Worcestershire. Salsa trtara. Salsa barbacoa. Salsa teriyaki. Salsa de soja, incluso la que tiene contenido reducido de [place]. Salsa de carne. Salsa de pescado. Salsa de Deepstep. Salsa rosada. Rbano picante envasado.  Ketchup y mostaza comunes. Saborizantes y tiernizantes para carne. Caldo en cubitos. Salsa picante. Salsa tabasco. Adobos. Aderezos para tacos. Salsas. Grasas y aceites Aderezos comunes para ensalada. Mantequilla con sal. Margarina. Mantequilla clarificada. Grasa de panceta.  Otros Nachos y papas fritas envasadas. Maz inflado y frituras de maz. Palomitas de maz y pretzels con sal. Sopas enlatadas o en polvo. Pizza. Pasteles y entradas congeladas.  Los artculos mencionados arriba pueden no ser Raytheon de las bebidas y los alimentos que se Theatre stage manager. Comunquese con el nutricionista para obtener ms informacin.   Esta informacin no tiene Theme park manager el consejo del mdico. Asegrese de hacerle al mdico cualquier pregunta que tenga.   Document Released: 08/10/2005 Document Revised: 08/31/2014 Elsevier Interactive Patient Education [employer]

## 2016-03-16 NOTE — Progress Notes (Signed)
Pt is the office today for lower abdominal pain and skin discoloration Pt states her hands turns purple and arm pits turn black Pt states the skin discoloration has been going on for 4 months Pt states the pain in her lower abdomen is like a pinch Pt states the abdomen pain has been going for a month and half Pt states she has not been taking anything for the pain

## 2016-03-17 ENCOUNTER — Other Ambulatory Visit: Payer: Self-pay | Admitting: Internal Medicine

## 2016-03-17 ENCOUNTER — Telehealth: Payer: Self-pay | Admitting: Internal Medicine

## 2016-03-17 DIAGNOSIS — K7460 Unspecified cirrhosis of liver: Secondary | ICD-10-CM

## 2016-03-17 LAB — PATHOLOGIST SMEAR REVIEW

## 2016-03-17 LAB — VITAMIN D 25 HYDROXY (VIT D DEFICIENCY, FRACTURES): Vit D, 25-Hydroxy: 47 ng/mL (ref 30–100)

## 2016-03-17 MED ORDER — POTASSIUM CHLORIDE CRYS ER 20 MEQ PO TBCR: 20.0000 meq | EXTENDED_RELEASE_TABLET | Freq: Every day | ORAL | 0 refills | Status: DC

## 2016-03-17 NOTE — Telephone Encounter (Signed)
[facility] states pt will have drug interaction °  °  ° potassium chloride SA (K-DUR,KLOR-CON) 20 MEQ tablet, spironolactone (ALDACTONE) 25 MG tablet °Please f/up  ° °

## 2016-03-17 NOTE — Telephone Encounter (Signed)
Left vm for Dr Jacqualine Mau, hepatology at [facility], re: pt's dyscrasias, talked to Misty Stanley, RN, she will page Dr. Jacqualine Mau.  dw Dr Jacqualine Mau via phn, reviewed labs, pt's ANC ok, will keep same rx of azathioprine for now. Close f/u w/ him.

## 2016-03-18 ENCOUNTER — Telehealth: Payer: Self-pay

## 2016-03-18 ENCOUNTER — Telehealth: Payer: Self-pay | Admitting: Internal Medicine

## 2016-03-18 DIAGNOSIS — E876 Hypokalemia: Secondary | ICD-10-CM

## 2016-03-18 MED ORDER — SPIRONOLACTONE 25 MG PO TABS: 25.0000 mg | ORAL_TABLET | Freq: Every day | ORAL | 2 refills | Status: DC

## 2016-03-18 NOTE — Telephone Encounter (Signed)
Contacted pt to go over lab results. Pt is aware of results and is aware of rx sent to the pharmacy. Pt doesn't have any questions or concerns at this time

## 2016-03-18 NOTE — Telephone Encounter (Signed)
[facility] states pt will have drug interaction      potassium chloride SA (K-DUR,KLOR-CON) 20 MEQ tablet, spironolactone (ALDACTONE) 25 MG tablet Please f/up

## 2016-03-18 NOTE — Telephone Encounter (Signed)
Potassium filled by Dr. Julien Nordmann yesterday. Refilled spironolactone.

## 2016-03-18 NOTE — Telephone Encounter (Signed)
Will forward to pcp

## 2016-03-20 ENCOUNTER — Telehealth: Payer: Self-pay | Admitting: Internal Medicine

## 2016-03-20 MED ORDER — POTASSIUM CHLORIDE CRYS ER 20 MEQ PO TBCR: 20.0000 meq | EXTENDED_RELEASE_TABLET | Freq: Every day | ORAL | 0 refills | Status: DC

## 2016-03-20 NOTE — Telephone Encounter (Signed)
Yes, and she is also on lasix, her k level was very low. We will rechk in couple of weeks, future lab placed, ask her to come in after taking the kdur for 2 wks for lab check.  thx

## 2016-03-20 NOTE — Telephone Encounter (Signed)
Pt calling stating that the [facility] instructed her to speak with nurse  Pharmacy said that taking both medications with cause her K level to decrease and stated pt needs to receive instructions of when to take the medications and if she should take them both together  Please call pt with instructions, thank you

## 2016-03-20 NOTE — Telephone Encounter (Signed)
Resent E-Rx with the message to discard if [facility] had received the script on 03/17/16.

## 2016-03-20 NOTE — Telephone Encounter (Signed)
Pt called stating [facility] is at the [facility] at [address] to pick up her potassium chloride SA (K-DUR,KLOR-CON) 20 MEQ tablet  The pharmacy is stating that they do not have the Rx, are you able to send another one? Thank you

## 2016-03-20 NOTE — Telephone Encounter (Signed)
Pt wants to to know does she need to stop taking a medication that is going to interact

## 2016-03-21 NOTE — Telephone Encounter (Signed)
Yes, please tell her to take all 3 meds (lasix, kdur and spironolactone), make sure she come back to chk labs in 1-2 wks. thx

## 2016-03-24 ENCOUNTER — Encounter: Payer: Self-pay | Admitting: Internal Medicine

## 2016-03-24 ENCOUNTER — Ambulatory Visit: Payer: Self-pay | Attending: Internal Medicine | Admitting: Internal Medicine

## 2016-03-24 VITALS — BP 121/74 | HR 93 | Temp 98.4°F | Resp 16 | Wt 164.8 lb

## 2016-03-24 DIAGNOSIS — N6452 Nipple discharge: Secondary | ICD-10-CM | POA: Insufficient documentation

## 2016-03-24 DIAGNOSIS — Z124 Encounter for screening for malignant neoplasm of cervix: Secondary | ICD-10-CM

## 2016-03-24 DIAGNOSIS — Z79899 Other long term (current) drug therapy: Secondary | ICD-10-CM | POA: Insufficient documentation

## 2016-03-24 DIAGNOSIS — D61818 Other pancytopenia: Secondary | ICD-10-CM | POA: Insufficient documentation

## 2016-03-24 DIAGNOSIS — Z880 Allergy status to penicillin: Secondary | ICD-10-CM | POA: Insufficient documentation

## 2016-03-24 DIAGNOSIS — Z0001 Encounter for general adult medical examination with abnormal findings: Secondary | ICD-10-CM | POA: Insufficient documentation

## 2016-03-24 DIAGNOSIS — K754 Autoimmune hepatitis: Secondary | ICD-10-CM | POA: Insufficient documentation

## 2016-03-24 DIAGNOSIS — E876 Hypokalemia: Secondary | ICD-10-CM | POA: Insufficient documentation

## 2016-03-24 DIAGNOSIS — K7460 Unspecified cirrhosis of liver: Secondary | ICD-10-CM | POA: Insufficient documentation

## 2016-03-24 NOTE — Progress Notes (Signed)
Yolanda Guzman, is a 40 y.o. female  ESP:[id number]  QJF:[id number]  DOB - Apr 17, 1976  Chief Complaint  Patient presents with  . Gynecologic Exam        Subjective:   Yolanda Guzman is a 40 y.o. female here today for a follow up visit for papsmear.  Per pt, last menses 03/16/16, not heavy, does not get them regularly. She denies vaginal discharge. She is currently not breastfeeding. Has not felt in breast lumps, but occasionally, when she presses hard on her nipples. Her grandmother had breast cancer.  She did pick up her kdur, but has not started them.   Patient has No headache, No chest pain, No abdominal pain - No Nausea, No new weakness tingling or numbness, No Cough - SOB.  No problems updated.  ALLERGIES: Allergies  Allergen Reactions  . Penicillins     PAST MEDICAL HISTORY: Past Medical History:  Diagnosis Date  . Hepatic cirrhosis (HCC)     MEDICATIONS AT HOME: Prior to Admission medications   Medication Sig Start Date End Date Taking? Authorizing Provider  AZATHIOPRINE PO Take 125 mg by mouth daily. 2.5 tabs total; Per DR Jacqualine Mau, [facility]   Yes Historical Provider, MD  furosemide (LASIX) 20 MG tablet Take 1 tablet (20 mg total) by mouth daily. 06/20/15  Yes Ambrose Finland, NP  nadolol (CORGARD) 40 MG tablet Take 1 tablet (40 mg total) by mouth daily. 04/19/15  Yes Louis Meckel, MD  omeprazole (PRILOSEC) 40 MG capsule Take 1 capsule (40 mg total) by mouth daily. 10/08/15  Yes Ruffin Frederick, MD  potassium chloride SA (K-DUR,KLOR-CON) 20 MEQ tablet Take 1 tablet (20 mEq total) by mouth daily. 03/20/16  Yes Pete Glatter, MD  spironolactone (ALDACTONE) 25 MG tablet Take 1 tablet (25 mg total) by mouth daily. 03/18/16  Yes Pete Glatter, MD     Objective:   Vitals:   03/24/16 1406  BP: 121/74  Pulse: 93  Resp: 16  Temp: 98.4 F (36.9 C)  TempSrc: Oral  SpO2: 98%  Weight: 164 lb 12.8 oz (74.8 kg)   Gyn exam done w/ CMA  assistance  Exam General appearance : Awake, alert, not in any distress. Speech Clear. Not toxic looking HEENT: Atraumatic and Normocephalic, pupils equally reactive to light. Neck: supple, no JVD. No cervical lymphadenopathy.  Chest:Good air entry bilaterally, no added sounds. bilat breast/axilla - no palpable masses, no nipple discharge on exam.  When pt's presses hard on her nipples, serous clear fluid noted bilat nipples. CVS: S1 S2 regular,  Abdomen: Bowel sounds active, Non tender + bs Pelvic Exam: Cervix normal in appearance, external genitalia normal, no adnexal masses or tenderness, no cervical motion tenderness, rectovaginal septum normal, uterus normal size, shape, and consistency and vagina normal with thick white mucous discharge.   Extremities: B/L Lower Ext shows no edema, both legs are warm to touch Neurology: Awake alert, and oriented X 3, CN II-XII grossly intact, Non focal Skin:No Rash  Data Review Lab Results  Component Value Date   HGBA1C 5.0 03/16/2016   HGBA1C 5.7 (H) 12/03/2014    Depression screen PHQ 2/9 03/16/2016  Decreased Interest 1  Down, Depressed, Hopeless 2  PHQ - 2 Score 3  Altered sleeping 3  Tired, decreased energy 3  Change in appetite 1  Feeling bad or failure about yourself  0  Trouble concentrating 0  Suicidal thoughts 0  PHQ-9 Score 10      Assessment & Plan  1. Pap smear for cervical cancer screening Pap smear today, wet prep  2. 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 - encouraged her to take all meds  3. Hypokalemia - on spironolactone/lasix, kdur daily, start today - chk bmp in next 1-2 wks.  4. bilat nipple discharge - suspect due to cirrhosis/hypoalbuminemia, but will get screening MM to r/o malignancy as well.    Patient have been counseled extensively about nutrition and exercise  Return in about 3 months (around 06/24/2016).  The patient was  given clear instructions to go to ER or return to medical center if symptoms don't improve, worsen or new problems develop. The patient verbalized understanding. The patient was told to call to get lab results if they haven't heard anything in the next week.   This note has been created with Education officer, environmental. Any transcriptional errors are unintentional.   Pete Glatter, MD, MBA/MHA [facility], [place] [phone]   03/24/2016, 2:23 PM

## 2016-03-24 NOTE — Patient Instructions (Signed)
We should get results in 1-2 wks on papsmear.  F/u 1-2 wks here in clinic for lab work.

## 2016-03-26 LAB — CERVICOVAGINAL ANCILLARY ONLY: Wet Prep (BD Affirm): POSITIVE — AB

## 2016-03-26 LAB — CYTOLOGY - PAP

## 2016-03-27 ENCOUNTER — Telehealth: Payer: Self-pay

## 2016-03-27 ENCOUNTER — Other Ambulatory Visit: Payer: Self-pay | Admitting: Internal Medicine

## 2016-03-27 MED ORDER — FLUCONAZOLE 150 MG PO TABS: 150.0000 mg | ORAL_TABLET | Freq: Every day | ORAL | 0 refills | Status: DC

## 2016-03-27 MED ORDER — NYSTATIN 100000 UNIT/GM EX POWD: Freq: Four times a day (QID) | CUTANEOUS | 0 refills | Status: DC

## 2016-03-27 NOTE — Telephone Encounter (Signed)
Contacted pt to go over lab results pt is aware of results and is aware of the rx sent to the pharmacy. Pt doesn't have any questions or concerns.

## 2016-04-07 ENCOUNTER — Ambulatory Visit: Payer: Medicaid Other | Attending: Internal Medicine

## 2016-04-07 DIAGNOSIS — E876 Hypokalemia: Secondary | ICD-10-CM | POA: Insufficient documentation

## 2016-04-07 NOTE — Progress Notes (Signed)
Patient here for lab only 

## 2016-04-22 ENCOUNTER — Other Ambulatory Visit: Payer: Self-pay | Admitting: Internal Medicine

## 2016-04-22 DIAGNOSIS — N6452 Nipple discharge: Secondary | ICD-10-CM

## 2016-05-04 ENCOUNTER — Ambulatory Visit
Admission: RE | Admit: 2016-05-04 | Discharge: 2016-05-04 | Disposition: A | Discharge status: Home / Self Care | Payer: Medicaid Other | Source: Ambulatory Visit | Attending: Internal Medicine | Admitting: Internal Medicine

## 2016-05-04 ENCOUNTER — Ambulatory Visit (HOSPITAL_COMMUNITY): Payer: Medicaid Other

## 2016-05-04 DIAGNOSIS — N6452 Nipple discharge: Secondary | ICD-10-CM

## 2016-05-06 ENCOUNTER — Telehealth: Payer: Self-pay | Admitting: Internal Medicine

## 2016-05-06 ENCOUNTER — Telehealth: Payer: Self-pay

## 2016-05-06 NOTE — Telephone Encounter (Signed)
Patient returning call to nurse.  Please call back.

## 2016-05-06 NOTE — Telephone Encounter (Signed)
[employer] Ruben Id: [id number] contacted pt to go over lab results pt did not answer lvm for pt to give me a call back at her earliest convenience  

## 2016-05-07 ENCOUNTER — Telehealth: Payer: Self-pay

## 2016-05-07 NOTE — Telephone Encounter (Signed)
[employer] SoperBecky Id: [id number] returned pt call to go over lab results pt is aware of results and doesn't have any questions or concerns.

## 2016-05-15 ENCOUNTER — Other Ambulatory Visit: Payer: Self-pay

## 2016-05-15 MED ORDER — NADOLOL 40 MG PO TABS: 40.0000 mg | ORAL_TABLET | Freq: Every day | ORAL | 3 refills | Status: DC

## 2016-05-15 NOTE — Telephone Encounter (Signed)
Nadolol refilled as directed.

## 2016-05-15 NOTE — Telephone Encounter (Signed)
Yes you can refill it. thanks 

## 2016-05-15 NOTE — Telephone Encounter (Signed)
[facility] from [facility] [place] requesting refill of Nadolol 40mg . One po qd with 3 refills #90. Is this ok to refill?

## 2016-06-20 ENCOUNTER — Other Ambulatory Visit: Payer: Self-pay | Admitting: Internal Medicine

## 2016-06-20 DIAGNOSIS — K7460 Unspecified cirrhosis of liver: Secondary | ICD-10-CM

## 2016-08-26 ENCOUNTER — Encounter: Payer: Self-pay | Admitting: Internal Medicine

## 2016-08-26 NOTE — Progress Notes (Signed)
[facility], DR Anupama Matcha 08/25/16 For abd pain. Admitted for Sepsis. Will scan notes

## 2016-09-01 ENCOUNTER — Encounter: Payer: Self-pay | Admitting: Internal Medicine

## 2016-09-01 NOTE — Progress Notes (Signed)
Pt seen by GI, DR Kerry FortSteven Guzman 08/06/16 Cirrhosis, MELD 14 - spironolactone 100mg   - gi increased lasix from 20 to 40mg  qd - azathioprine 125qd - naldolol   Will scan note.

## 2016-09-02 ENCOUNTER — Encounter: Payer: Self-pay | Admitting: Internal Medicine

## 2016-09-02 NOTE — Progress Notes (Signed)
Pt admitted [facility], 1/2-09/01/16  +sbp, ciproflx rx, azathioprine 50qd, naldolol 40qd, lactulose, lasix 40qd, sprionolactone 100 qd  Echo 08/26/16 ef 60-65%  ct abd pelvis cirrhosis, large vol ascites, portal htn, marked splenomegaly.  Pt has hospital f/u w/ me 09/09/15 Will scan notes.

## 2016-09-08 ENCOUNTER — Ambulatory Visit: Payer: Self-pay | Attending: Internal Medicine | Admitting: Internal Medicine

## 2016-09-08 ENCOUNTER — Encounter: Payer: Self-pay | Admitting: Internal Medicine

## 2016-09-08 VITALS — BP 108/73 | HR 59 | Temp 98.1°F | Resp 16 | Wt 156.0 lb

## 2016-09-08 DIAGNOSIS — K7460 Unspecified cirrhosis of liver: Secondary | ICD-10-CM | POA: Insufficient documentation

## 2016-09-08 DIAGNOSIS — Z880 Allergy status to penicillin: Secondary | ICD-10-CM | POA: Insufficient documentation

## 2016-09-08 DIAGNOSIS — Z79899 Other long term (current) drug therapy: Secondary | ICD-10-CM | POA: Insufficient documentation

## 2016-09-08 DIAGNOSIS — K754 Autoimmune hepatitis: Secondary | ICD-10-CM | POA: Insufficient documentation

## 2016-09-08 DIAGNOSIS — K766 Portal hypertension: Secondary | ICD-10-CM | POA: Insufficient documentation

## 2016-09-08 DIAGNOSIS — R161 Splenomegaly, not elsewhere classified: Secondary | ICD-10-CM | POA: Insufficient documentation

## 2016-09-08 DIAGNOSIS — Z23 Encounter for immunization: Secondary | ICD-10-CM | POA: Insufficient documentation

## 2016-09-08 LAB — CBC WITH DIFFERENTIAL/PLATELET
Basophils Absolute: 0 cells/uL (ref 0–200)
Basophils Relative: 0 %
EOS ABS: 105 {cells}/uL (ref 15–500)
Eosinophils Relative: 7 %
HEMATOCRIT: 33.9 % — AB (ref 35.0–45.0)
Hemoglobin: 11.2 g/dL — ABNORMAL LOW (ref 11.7–15.5)
LYMPHS ABS: 600 {cells}/uL — AB (ref 850–3900)
Lymphocytes Relative: 40 %
MCH: 31.1 pg (ref 27.0–33.0)
MCHC: 33 g/dL (ref 32.0–36.0)
MCV: 94.2 fL (ref 80.0–100.0)
MONO ABS: 105 {cells}/uL — AB (ref 200–950)
MONOS PCT: 7 %
NEUTROS ABS: 690 {cells}/uL — AB (ref 1500–7800)
Neutrophils Relative %: 46 %
PLATELETS: 56 10*3/uL — AB (ref 140–400)
RBC: 3.6 MIL/uL — AB (ref 3.80–5.10)
RDW: 16 % — ABNORMAL HIGH (ref 11.0–15.0)
WBC: 1.5 10*3/uL — AB (ref 3.8–10.8)

## 2016-09-08 LAB — CMP AND LIVER
ALT: 31 U/L — ABNORMAL HIGH (ref 6–29)
AST: 50 U/L — ABNORMAL HIGH (ref 10–30)
Albumin: 2.7 g/dL — ABNORMAL LOW (ref 3.6–5.1)
Alkaline Phosphatase: 127 U/L — ABNORMAL HIGH (ref 33–115)
BILIRUBIN DIRECT: 0.5 mg/dL — AB (ref <=0.2)
BUN: 6 mg/dL — ABNORMAL LOW (ref 7–25)
CALCIUM: 7.7 mg/dL — AB (ref 8.6–10.2)
CHLORIDE: 102 mmol/L (ref 98–110)
CO2: 30 mmol/L (ref 20–31)
Creat: 0.64 mg/dL (ref 0.50–1.10)
Glucose, Bld: 152 mg/dL — ABNORMAL HIGH (ref 65–99)
Indirect Bilirubin: 1.2 mg/dL (ref 0.2–1.2)
POTASSIUM: 3.7 mmol/L (ref 3.5–5.3)
Sodium: 136 mmol/L (ref 135–146)
Total Bilirubin: 1.7 mg/dL — ABNORMAL HIGH (ref 0.2–1.2)
Total Protein: 5.6 g/dL — ABNORMAL LOW (ref 6.1–8.1)

## 2016-09-08 NOTE — Patient Instructions (Addendum)
Ascitis (Ascites) La ascitis es la acumulacin de exceso de lquido en el abdomen. Puede ser leve o grave y empeorar si no se Chiropractor. CAUSAS Las causas posibles son las siguientes:  Cirrosis. Esta es la causa ms frecuente de ascitis.  Infeccin o inflamacin abdominal.  Cncer en el abdomen.  Insuficiencia cardaca.  Enfermedades renales.  Inflamacin del pncreas.  Cogulos en las venas del hgado. SIGNOS Y SNTOMAS Dynegy signos y sntomas se incluyen los siguientes:  Sensacin de saciedad en el abdomen. Esto es frecuente.  Aumento del tamao del abdomen o la cintura.  Hinchazn de las piernas.  Hinchazn del escroto en los hombres.  Dificultad para respirar.  Dolor abdominal.  Aumento repentino de [place]. Si la enfermedad es leve, es posible que no tenga sntomas. DIAGNSTICO Para realizar un diagnstico, el mdico:  Le preguntar su historia clnica.  Le realizar un examen fsico.  Pedir estudios de diagnstico por imgenes, como una ecografa o una tomografa computarizada del abdomen. TRATAMIENTO El tratamiento depende de la causa de la ascitis. Puede incluir lo siguiente:  Tomar un medicamento que lo haga orinar, llamado diurtico.  Reducir estrictamente el consumo de sal (sodio). La sal puede causar la acumulacin del exceso de lquido en el cuerpo, lo que empeora la ascitis.  Someterse a un procedimiento para extraer el lquido del abdomen (paracentesis abdominal).  Someterse a un procedimiento para pasar el lquido del abdomen a una vena.  Someterse a un procedimiento que [employer] de las principales venas del hgado y [place] la presin sobre este rgano (procedimiento de derivacin portosistmica intraheptica transyugular, DPIT). La ascitis puede desaparecer o mejorar con el tratamiento de la afeccin que la caus. INSTRUCCIONES PARA EL CUIDADO EN EL HOGAR  Lleve un registro de [employer]. Para hacerlo, psese a la [employer] y registre su peso.  Lleve un registro de la cantidad de lquido que bebe y los cambios en la cantidad que [place].  Siga las indicaciones del mdico respecto de la cantidad de lquido que puede beber.  Intente no comer comidas saladas (con alto contenido de sodio).  Tome los medicamentos solamente como se lo haya indicado el mdico.  Concurra a todas las visitas de control como se lo haya indicado el mdico. Esto es importante.  Informe al mdico si hay cambios en su salud, especialmente si tiene sntomas nuevos o los sntomas empeoran. SOLICITE ATENCIN MDICA SI:  Aumenta ms de 3libras en 3das.  Hay un aumento del tamao del abdomen o la cintura.  Tiene un nuevo episodio de hinchazn en las piernas.  La hinchazn de las piernas empeora. SOLICITE ATENCIN MDICA DE INMEDIATO SI:  Tiene fiebre.  Se siente confundido.  Presenta nuevas dificultades para respirar o la dificultad empeora.  Tiene un nuevo episodio de dolor abdominal o el dolor empeora.  Tiene un nuevo episodio de hinchazn del escroto (en los hombres). Esta informacin no tiene Theme park manager el consejo del mdico. Asegrese de hacerle al mdico cualquier pregunta que tenga. Document Released: 08/10/2005 Document Revised: 12/02/2015 Document Reviewed: 03/09/2014 Elsevier Interactive Patient Education  2017 [employer].  -  Cirrosis (Cirrhosis) La cirrosis es una enfermedad heptica a largo plazo (crnica). El hgado es el rgano interno ms grande y cumple muchas funciones. Este rgano transforma los [employer] Corporation, elimina las sustancias txicas de la [place], [place] protenas importantes y absorbe las vitaminas necesarias de la dieta. En las personas que tienen cirrosis, muchas de las clulas hepticas  han sido reemplazadas por tejido cicatricial. Esto impide que la sangre circule por el hgado, lo que dificulta el funcionamiento de este rgano. Esta fibrosis heptica es  irreversible, pero el tratamiento puede evitar que empeore. CAUSAS La hepatitisC y el consumo prolongado de alcohol son las causas ms comunes de la cirrosis. Otras causas son las siguientes:  Enfermedad del hgado graso no relacionada con el alcohol.  Infeccin por hepatitisB.  Hepatitis autoinmune.  Enfermedades que [employer] conductos dentro del hgado.  Enfermedades hepticas hereditarias.  Reacciones a determinados medicamentos que se toman a Air cabin crew.  Infecciones por parsitos.  Exposicin prolongada a ciertas sustancias txicas. FACTORES DE RIESGO Puede tener un riesgo ms alto de tener cirrosis en los siguientes casos:  Tiene ciertos virus de la hepatitis.  Consume alcohol en exceso, especialmente si es mujer.  Tiene sobrepeso.  Comparte agujas.  Tiene relaciones sexuales con alguien que tiene hepatitis. SNTOMAS Es posible que no tenga signos ni sntomas al principio. Puede que los sntomas no se manifiesten hasta que el dao heptico empiece a Theme park manager. Los signos y los sntomas de cirrosis pueden incluir los siguientes:  Dolor a la palpacin en la parte superior derecha del abdomen.  Debilidad y cansancio (fatiga).  Prdida del apetito.  Nuseas.  Adelgazamiento y prdida de la masa muscular.  Picazn.  Piel y ojos amarillos (ictericia).  Acumulacin de lquido en el abdomen (ascitis).  Hinchazn de los pies y los tobillos (edema).  Aparicin de vasos sanguneos diminutos debajo de la piel.  Confusin mental.  Tener hematomas o hemorragias. DIAGNSTICO El mdico puede sospechar la presencia de cirrosis en funcin de los sntomas y la historia clnica, en especial si tiene otras enfermedades o antecedentes de consumo de alcohol. El mdico har un examen fsico para palparle el hgado y buscar signos de cirrosis. El mdico puede realizar otras [place], entre ellas:  Anlisis de sangre para controlar:  Si tiene hepatitisB oC.  La  funcin renal.  La funcin heptica.  Pruebas de diagnstico por imgenes, por ejemplo:  Resonancia magntica (RM) o tomografa computarizada (TC) para buscar los cambios que se observan en los casos de cirrosis [place].  Ecografa para determinar si el tejido heptico normal est siendo reemplazado por tejido cicatricial.  Un procedimiento en el que se [place] una aguja larga para tomar [place] de tejido heptico (biopsia) para ser examinado con un microscopio. La biopsia de hgado puede confirmar el diagnstico de cirrosis. TRATAMIENTO El tratamiento depende de la magnitud del dao heptico y qu lo caus. Puede incluir el tratamiento de los sntomas de cirrosis o de las causas preexistentes de la enfermedad, a fin de Scientist, research (physical sciences) el avance del [place]. El tratamiento puede incluir lo siguiente:  Cambios de estilo de vida, como:  Consumir una dieta saludable.  Restringir la ingesta de sal.  Mantener un peso saludable.  No consumir drogas o alcohol.  Tomar medicamentos para:  Warehouse manager las infecciones del hgado u otras infecciones.  Controlar la picazn.  Disminuir la acumulacin de lquido.  Reducir ciertas sustancias txicas de la sangre.  Reducir el riesgo de hemorragia de los vasos sanguneos agrandados en el estmago o el esfago (vrices).  Si las vrices causan problemas hemorrgicos, tal vez deba someterse a un tratamiento con un procedimiento mediante el cual los vasos sanguneos se anudan y se caen (ligadura con banda).  Si la cirrosis est causando insuficiencia heptica, el mdico puede recomendar un trasplante de hgado.  Se pueden recomendar otros tratamientos en funcin de  las complicaciones de la cirrosis, como insuficiencia renal relacionada con el hgado (sndrome hepatorrenal). INSTRUCCIONES PARA EL CUIDADO EN EL HOGAR  Tome los medicamentos solamente como se lo haya indicado el mdico. No consuma medicamentos que sean txicos para el hgado. Consulte  al mdico antes de tomar algn medicamento nuevo, incluidos los de [facility].  Descanse todo lo que sea necesario.  Consumir una Tree surgeon. Solicite ms informacin al mdico o al nutricionista.  Tal vez deba seguir una dieta con bajo contenido de sal o restringir el consumo de agua como se lo hayan indicado.  No beba alcohol. Esto es especialmente importante si est tomando paracetamol.  Concurra a todas las visitas de control como se lo haya indicado el mdico. Esto es importante. SOLICITE ATENCIN MDICA SI:  Tiene fatiga o debilidad que empeora.  Observa que se le [employer], los pies, las piernas o la cara.  Tiene fiebre.  Pierde el apetito.  Tiene nuseas o vmitos.  Tiene ictericia.  Se le forman hematomas o sangra con facilidad. SOLICITE ATENCIN MDICA DE INMEDIATO SI:  Vomita sangre de color rojo brillante o una sustancia parecida a los granos de caf.  Observa sangre en las heces.  Las heces son de color negro y de aspecto alquitranado.  Se siente confundido.  Tiene dolor en el pecho o dificultad para respirar. Esta informacin no tiene Theme park manager el consejo del mdico. Asegrese de hacerle al mdico cualquier pregunta que tenga. Document Released: 08/10/2005 Document Revised: 12/02/2015 Document Reviewed: 04/18/2014 Elsevier Interactive Patient Education  2017 Elsevier Inc.  Madilyn Fireman Td (contra la difteria y el ttanos): Lo que debe saber (Td Vaccine [place] and Diphtheria]: What You Need to Know) 1. Por qu vacunarse? El ttanos y la difteria son enfermedades muy graves. Son Academic librarian frecuentes en los [place] actualmente, pero las personas que se infectan suelen tener complicaciones graves. La vacuna Td se [place] para proteger a los adolescentes y a los adultos de ambas enfermedades. Tanto el ttanos como la difteria son infecciones causadas por bacterias. La difteria se transmite de persona a persona a travs de la tos o el  estornudo. La bacteria que causa el ttanos entra al cuerpo a travs de cortes, raspones o heridas. El TTANOS (trismo) provoca entumecimiento y Engineer, materials dolorosa de los msculos, por lo general, en todo el cuerpo.  Puede causar el endurecimiento de los msculos de la cabeza y el cuello, de modo que impide abrir la boca, tragar y en algunos casos, Industrial/product designer. El ttanos es causa de muerte en aproximadamente 1de cada 10personas que contraen la infeccin, incluso despus de que reciben la mejor atencin mdica. La DIFTERIA puede hacer que se forme una membrana gruesa en la parte posterior de la garganta.  Puede causar problemas respiratorios, parlisis, insuficiencia cardaca e incluso la muerte. Antes de las vacunas, en los [place] se informaban 200000 casos de difteria y cientos de casos de ttanos cada ao. Desde que comenz la vacunacin, los informes de casos de ambas enfermedades se han reducido en un 99%. 2. Madilyn Fireman Td La vacuna Td protege a adolescentes y adultos contra el ttanos y la difteria. La vacuna Td habitualmente se aplica como dosis de refuerzo cada 10aos, pero tambin puede administrarse antes si la persona sufre una [place] o herida sucia y grave. A veces, en lugar de la vacuna Td, se recomienda una vacuna llamada Tdap, que protege contra la tosferina, adems de proteger contra el ttanos y la difteria. El mdico o  la persona que le aplique la vacuna puede darle ms informacin al Beazer Homesrespecto. La Td puede administrarse de manera segura simultneamente con otras vacunas. 3. Algunas personas no deben recibir la vacuna  Una persona que alguna vez ha tenido una reaccin alrgica potencialmente mortal a una dosis anterior de cualquier vacuna contra el ttanos o la difteria, O que tenga una alergia grave a cualquier parte de esta vacuna, no debe recibir la vacuna Td. Informe a la persona que le aplica la vacuna si usted tiene cualquier alergia grave.  Consulte con su mdico  si:  tuvo hinchazn o dolor intenso despus de recibir cualquier vacuna contra la difteria o el ttanos,  alguna vez ha sufrido el sndrome de [place]ill[place],  no se siente Research scientist (life sciences)bien el da en que se ha programado la vacuna. 4. Riesgos de [place] reaccin a la vacuna Con cualquier medicamento, incluyendo las vacunas, existe la posibilidad de que aparezcan efectos secundarios. Suelen ser leves y desaparecen por s solos. Tambin son posibles las reacciones graves, pero en raras ocasiones. La Harley-Davidsonmayora de las personas a las que se les aplica la vacuna Td no tienen ningn problema. Problemas leves despus de la vacuna Td: (No interfirieron en otras actividades)  Dolor en el lugar donde se aplic la vacuna (alrededor de 8de cada 10personas)  Enrojecimiento o hinchazn en el lugar donde se aplic la vacuna (alrededor de 1de cada 4personas)  Fiebre leve (poco frecuente)  Dolor de Training and development officercabeza (alrededor de 1de cada 4personas)  Cansancio (alrededor de 1de cada 4personas) Problemas moderados despus de la vacuna Td: (Interfirieron en otras actividades, pero no requirieron atencin mdica)  Fiebre superior a 102F (38,8C) (poco frecuente) Problemas graves despus de la vacuna Td: (Impidieron Education officer, environmentalrealizar las actividades habituales; requirieron atencin mdica)  Buyer, retailHinchazn, dolor intenso, sangrado o enrojecimiento en el brazo en que se aplic la vacuna (poco frecuente). Problemas que podran ocurrir despus de cualquier vacuna:   Las personas a veces se desmayan despus de un procedimiento mdico, incluida la vacunacin. Si permanece sentado o recostado durante 15 minutos puede ayudar a Lubrizol Corporationevitar los desmayos y las lesiones causadas por las cadas. Informe al mdico si se siente mareado, tiene cambios en la visin o zumbidos en los odos.  Algunas personas sienten un dolor intenso en el hombro y tienen dificultad para mover el brazo donde se coloc la vacuna. Esto sucede con muy poca  frecuencia.  Cualquier medicamento puede causar una reaccin alrgica grave. Dichas reacciones son Lynnae Sandhoffmuy poco frecuentes con una vacuna (se calcula que menos de 1en un milln de dosis) y se producen de unos minutos a unas horas despus de Arts development officerla administracin. Al igual que con cualquier Automatic Datamedicamento, existe una probabilidad muy remota de que una vacuna cause una lesin grave o la [place]. Se controla permanentemente la seguridad de las vacunas. Para obtener ms informacin, visite: http://floyd.org/www.cdc.gov/vaccinesafety/. 5. Qu pasa si hay una reaccin grave? A qu signos debo estar atento?   Observe todo lo que le preocupe, como signos de una reaccin alrgica grave, fiebre muy alta o comportamiento fuera de lo normal. Los signos de una reaccin alrgica grave pueden incluir ronchas, hinchazn de la cara y la garganta, dificultad para respirar, latidos cardacos acelerados, mareos y debilidad. Generalmente, estos comenzaran entre unos pocos minutos y algunas horas despus de la vacunacin. Qu debo hacer?   Si usted piensa que se trata de una reaccin alrgica grave o de otra emergencia que no puede esperar, llame al 911 o dirjase al hospital ms cercano. Sino, llame a  su mdico.  Despus, la reaccin debe informarse al [employer] de Informacin sobre Efectos Adversos de las [place] (Vaccine Adverse Event Reporting System, VAERS). Su mdico puede presentar este informe, o puede hacerlo usted mismo a travs del sitio web de VAERS, en www.vaers.LAgents.no, o llamando al ([phone]. VAERS no brinda recomendaciones mdicas.  6. [employer] de Compensacin de Daos por [employer] [place] de Compensacin de Daos por Administrator, arts (National Vaccine Injury Compensation Program, VICP) es un programa federal que fue creado para Patent examiner a las personas que puedan haber sufrido daos al recibir ciertas vacunas. Aquellas personas que consideren que han sufrido un dao como consecuencia de una vacuna y [place]  saber ms acerca del programa y de cmo presentar Roslynn Amble, pueden llamar al [phone] o visitar su sitio web en [email or web address]. Hay un lmite de tiempo para presentar un reclamo de compensacin. 7. Cmo puedo obtener ms informacin?  Consulte a su mdico. Este puede darle el prospecto de la vacuna o recomendarle otras fuentes de informacin.  Comunquese con el servicio de salud de su localidad o [address].  Comunquese con los Centros para Air traffic controller y la Prevencin de Child psychotherapist for Disease Control and Prevention , CDC).  Llame al [phone] (1-800-CDC-INFO).  Visite el sitio Environmental manager en [email or web address]. Declaracin de informacin sobre la vacuna contra la difteria y el ttanos (Td) de los CDC (12/03/15) Esta informacin no tiene Theme park manager el consejo del mdico. Asegrese de hacerle al mdico cualquier pregunta que tenga. Document Released: 11/26/2008 Document Revised: 08/31/2014 Document Reviewed: 12/03/2015 Elsevier Interactive Patient Education  2017 Elsevier Inc. Influenza Virus Vaccine injection (Fluarix) Qu es este medicamento? La VACUNA ANTIGRIPAL ayuda a disminuir el riesgo de contraer la influenza, tambin conocida como la gripe. La vacuna solo ayuda a protegerle contra algunas cepas de influenza. Esta vacuna no ayuda a reducir Nurse, adult de contraer influenza pandmica H1N1. Este medicamento puede ser utilizado para otros usos; si tiene alguna pregunta consulte con su proveedor de atencin mdica o con su farmacutico. MARCAS COMUNES: Fluarix, Fluzone Qu le debo informar a mi profesional de la salud antes de tomar este medicamento? Necesita saber si usted presenta alguno de los siguientes problemas o situaciones: -trastorno de sangrado como hemofilia -fiebre o infeccin -sndrome de Guillain-Barre u otros problemas neurolgicos -problemas del sistema inmunolgico -infeccin por el virus de la inmunodeficiencia  humana (VIH) o SIDA -niveles bajos de plaquetas en la sangre -esclerosis mltiple -una Automotive engineer o inusual a las vacunas antigripales, a los huevos, protenas de pollo, al ltex, a la gentamicina, a otros medicamentos, alimentos, colorantes o conservantes -si est embarazada o buscando quedar embarazada -si est amamantando a un beb Cmo debo utilizar este medicamento? Esta vacuna se administra mediante inyeccin por va intramuscular. Lo administra un profesional de [employer]. Recibir una copia de informacin escrita sobre la vacuna antes de cada vacuna. Asegrese de leer este folleto cada vez cuidadosamente. Este folleto puede cambiar con frecuencia. Hable con su pediatra para informarse acerca del uso de este medicamento en nios. Puede requerir atencin especial. Sobredosis: Pngase en contacto inmediatamente con un centro toxicolgico o una sala de urgencia si usted cree que haya tomado demasiado medicamento. ATENCIN: [employer] es solo para usted. No comparta este medicamento con nadie. Qu sucede si me olvido de una dosis? No se aplica en este caso. Qu puede interactuar con este medicamento? -quimioterapia o radioterapia -medicamentos que suprimen el sistema inmunolgico, tales como etanercept, anakinra, infliximab y adalimumab -medicamentos que  tratan o previenen cogulos sanguneos, como warfarina -fenitona -medicamentos esteroideos, como la prednisona o la cortisona -teofilina -vacunas Puede ser que esta lista no menciona todas las posibles interacciones. Informe a su profesional de Beazer Homes de [employer] productos a base de hierbas, medicamentos de [place] o suplementos nutritivos que est tomando. Si usted fuma, consume bebidas alcohlicas o si utiliza drogas ilegales, indqueselo tambin a su profesional de [employer]. Algunas sustancias pueden interactuar con su medicamento. A qu debo estar atento al usar PPL Corporation? Informe a su mdico o a Water quality scientist de la [employer] todos los efectos secundarios que persistan despus de [address]. Llame a su proveedor de atencin mdica si se presentan sntomas inusuales dentro de las 6 semanas posteriores a la vacunacin. Es posible que todava pueda contraer la gripe, pero la enfermedad no ser tan fuerte como normalmente. No puede contraer la gripe de esta vacuna. La vacuna antigripal no le protege contra resfros u otras enfermedades que pueden causar [place]. Debe vacunarse cada ao. Qu efectos secundarios puedo tener al Boston Scientific este medicamento? Efectos secundarios que debe informar a su mdico o a Producer, television/film/video de la salud tan pronto como sea posible: -reacciones alrgicas como erupcin cutnea, picazn o urticarias, hinchazn de la cara, labios o lengua Efectos secundarios que, por lo general, no requieren atencin mdica (debe informarlos a su mdico o a su profesional de la salud si persisten o si son molestos): -fiebre -dolor de cabeza -molestias y dolores musculares -dolor, sensibilidad, enrojecimiento o Paramedic de la inyeccin -cansancio o debilidad Puede ser que esta lista no menciona todos los posibles efectos secundarios. Comunquese a su mdico por asesoramiento mdico [employer]. Usted puede informar los efectos secundarios a la FDA por telfono al 1-800-FDA-1088. Dnde debo guardar mi medicina? Esta vacuna se administra solamente en clnicas, farmacias, consultorio mdico u otro consultorio de un profesional de la salud y no Teacher, early years/pre en su domicilio. ATENCIN: Este folleto es un resumen. Puede ser que no cubra toda la posible informacin. Si usted tiene preguntas acerca de esta medicina, consulte con su mdico, su farmacutico o su profesional de Radiographer, therapeutic.  2017 Elsevier/Gold Standard (2010-02-11 15:31:40)

## 2016-09-08 NOTE — Progress Notes (Signed)
Yolanda Guzman, is a 41 y.o. female  ZOX:[id number]  WJX:[id number]  DOB - Nov 04, 1975  Chief Complaint  Patient presents with  . Hospitalization Follow-up        Subjective:   Yolanda Guzman is a 41 y.o. female here today for a follow up visit.  Recent hospitalization 1/2-09/01/16 for sbp. Ct abd pelvis large vol ascites, portal htn, marked splenomegaly, echo wnl. Pt saw Dr [facility]qual[facility]acks[facility] 09/03/16 and has since kept pt on ciprofloxacin 500 qd indefinitely per pt's prx pill bottle.  She denies any c/o, had 2bms yesterday, aiming for 2-3bms/day. Had many questions about transplant and need for lasix, etc.  Of note, pt was given info by Dr Jacqualine MauZacks about getting on the transplant list, but she needs to fill out the packet.  Mild ruq abd pain yesterday, resolved.  Patient has No headache, No chest pain, No abdominal pain today - No Nausea, No new weakness tingling or numbness, No Cough - SOB.  She is here w/ her friend.  No problems updated.  ALLERGIES: Allergies  Allergen Reactions  . Penicillins     PAST MEDICAL HISTORY: Past Medical History:  Diagnosis Date  . Hepatic cirrhosis (HCC)     MEDICATIONS AT HOME: Prior to Admission medications   Medication Sig Start Date End Date Taking? Authorizing Provider  ciprofloxacin (CIPRO) 500 MG tablet Take 500 mg by mouth daily.   Yes Historical Provider, MD  AZATHIOPRINE PO Take 125 mg by mouth daily. 2.5 tabs total; Per DR [facility]    Historical Provider, MD  fluconazole (DIFLUCAN) 150 MG tablet Take 1 tablet (150 mg total) by mouth daily. Patient not taking: Reported on 09/08/2016 03/27/16   Pete Glatterawn T Timaya Bojarski, MD  furosemide (LASIX) 20 MG tablet Take 1 tablet (20 mg total) by mouth daily. 06/20/15   Ambrose FinlandValerie A Keck, NP  nadolol (CORGARD) 40 MG tablet Take 1 tablet (40 mg total) by mouth daily. 05/15/16   Ruffin FrederickSteven Paul Armbruster, MD  nystatin (MYCOSTATIN/NYSTOP) powder Apply topically 4 (four) times daily. 03/27/16   Pete Glatterawn T Mikah Poss,  MD  omeprazole (PRILOSEC) 40 MG capsule Take 1 capsule (40 mg total) by mouth daily. 10/08/15   Ruffin FrederickSteven Paul Armbruster, MD  potassium chloride SA (K-DUR,KLOR-CON) 20 MEQ tablet Take 1 tablet (20 mEq total) by mouth daily. 03/20/16   Pete Glatterawn T Jossilyn Benda, MD  spironolactone (ALDACTONE) 25 MG tablet TAKE 1 TABLET (25 MG TOTAL) BY MOUTH DAILY. 06/22/16   Quentin Angstlugbemiga E Jegede, MD     Objective:   Vitals:   09/08/16 1120  BP: 108/73  Pulse: (!) 59  Resp: 16  Temp: 98.1 F (36.7 C)  TempSrc: Oral  SpO2: 100%  Weight: 156 lb (70.8 kg)    Exam General appearance : Awake, alert, not in any distress. Speech Clear. Not toxic looking, pleasant. HEENT: Atraumatic and Normocephalic, pupils equally reactive to light. Neck: supple, no JVD. No cervical lymphadenopathy.  Chest:Good air entry bilaterally, no added sounds. CVS: S1 S2 regular, no murmurs/gallups or rubs. Abdomen: Bowel sounds active, Non tender, distended with no gaurding, rigidity or rebound. Extremities: B/L Lower Ext shows trace bilat le edema, both legs are warm to touch Neurology: Awake alert, and oriented X 3, CN II-XII grossly intact, Non focal, no asterixis. Skin:No Rash  Data Review Lab Results  Component Value Date   HGBA1C 5.0 03/16/2016   HGBA1C 5.7 (H) 12/03/2014    Depression screen PHQ 2/9 09/08/2016 03/24/2016 03/16/2016  Decreased Interest 3 0 1  Down, Depressed, Hopeless 0 1 2  PHQ - 2 Score 3 1 3   Altered sleeping 1 - 3  Tired, decreased energy 1 - 3  Change in appetite 3 - 1  Feeling bad or failure about yourself  0 - 0  Trouble concentrating 0 - 0  Moving slowly or fidgety/restless 0 - -  Suicidal thoughts 0 - 0  PHQ-9 Score 8 - 10      Assessment & Plan   1. Autoimmune hepatitis (HCC) w/ cirrhosis, portal hypertension and splenomegaly - recent hospitalization for sbp, recent MELD 14 - currently on spironolactone 100mg , gi increased lasix from 20 to 40mg  qd, azathioprine 125qd, naldolol 40 qd, and cipr  500qd - defer to gi/Dr Jacqualine Mau, pt pending pwk to get on transplant list. - CMP and Liver - CBC with Differential - appears euvolemic today.  2. Cirrhosis of liver without ascites, unspecified hepatic cirrhosis type (HCC) See #1  3. Pt had pneumococcal 23 at outside hospital 09/01/16, documented tdap and flu vac today.   Patient have been counseled extensively about nutrition and exercise  Return in about 3 months (around 12/07/2016), or if symptoms worsen or fail to improve.  The patient was given clear instructions to go to ER or return to medical center if symptoms don't improve, worsen or new problems develop. The patient verbalized understanding. The patient was told to call to get lab results if they haven't heard anything in the next week.   This note has been created with Education officer, environmental. Any transcriptional errors are unintentional.   Pete Glatter, MD, MBA/MHA [facility] [place], [place] [phone]   09/08/2016, 11:44 AM

## 2016-09-11 ENCOUNTER — Telehealth: Payer: Self-pay

## 2016-09-11 NOTE — Telephone Encounter (Signed)
Contacted pt to go over lab results pt is aware of results and doesn't have any questions or concerns 

## 2016-09-15 ENCOUNTER — Encounter: Payer: Self-pay | Admitting: Internal Medicine

## 2016-09-15 NOTE — Progress Notes (Signed)
Pt seen by DR Brooke DareSteven Zacks, [facility] , 09/03/16 + autoimmune hepatitis Planning to keep her on sbp ppx, no changes on diurectics, referred her to transplant eval, but she needs Medicaid or other insurance before testing could be started  Will scan notes.

## 2016-10-21 ENCOUNTER — Other Ambulatory Visit: Payer: Self-pay | Admitting: Internal Medicine

## 2016-10-21 DIAGNOSIS — K7460 Unspecified cirrhosis of liver: Secondary | ICD-10-CM

## 2016-11-09 ENCOUNTER — Telehealth: Payer: Self-pay | Admitting: Gastroenterology

## 2016-11-13 ENCOUNTER — Telehealth: Payer: Self-pay

## 2016-11-13 NOTE — Telephone Encounter (Signed)
Dr. Adela LankArmbruster,  This pt has NCMCD. Nadolol is a non-preferred medication on this insurance plan. Is there another medication that she can switch to?

## 2016-11-13 NOTE — Telephone Encounter (Signed)
Note sent to Dr. Adela LankArmbruster.

## 2016-11-16 ENCOUNTER — Other Ambulatory Visit: Payer: Self-pay

## 2016-11-16 MED ORDER — PROPRANOLOL HCL 20 MG PO TABS: 20.0000 mg | ORAL_TABLET | Freq: Two times a day (BID) | ORAL | 3 refills | Status: DC

## 2016-11-16 NOTE — Telephone Encounter (Signed)
Yes, propranolol dosed at 20mg  twice daily would be the alternative to switch to, not sure if she has tried this yet but we can prescribe it for her if she cannot afford the nadolol. Thanks. Please give her a 3 month supply with 3 refills

## 2016-11-16 NOTE — Telephone Encounter (Signed)
Propranolol sent. Pt informed on home vmail.

## 2016-12-01 ENCOUNTER — Ambulatory Visit: Payer: Medicaid Other | Attending: Internal Medicine | Admitting: Internal Medicine

## 2016-12-01 ENCOUNTER — Telehealth: Payer: Self-pay | Admitting: Internal Medicine

## 2016-12-01 ENCOUNTER — Encounter: Payer: Self-pay | Admitting: Internal Medicine

## 2016-12-01 VITALS — BP 100/63 | HR 63 | Temp 98.0°F | Resp 16 | Wt 154.4 lb

## 2016-12-01 DIAGNOSIS — Z79899 Other long term (current) drug therapy: Secondary | ICD-10-CM | POA: Insufficient documentation

## 2016-12-01 DIAGNOSIS — K754 Autoimmune hepatitis: Secondary | ICD-10-CM | POA: Diagnosis not present

## 2016-12-01 DIAGNOSIS — T148XXA Other injury of unspecified body region, initial encounter: Secondary | ICD-10-CM

## 2016-12-01 DIAGNOSIS — S161XXA Strain of muscle, fascia and tendon at neck level, initial encounter: Secondary | ICD-10-CM | POA: Insufficient documentation

## 2016-12-01 DIAGNOSIS — Z880 Allergy status to penicillin: Secondary | ICD-10-CM | POA: Diagnosis not present

## 2016-12-01 NOTE — Patient Instructions (Addendum)
Vitamin D 800 IU /day   RHCE para los cuidados de rutina de las lesiones (RICE for Routine Care of Injuries) Muchas lesiones pueden tratarse con reposo, hielo, compresin y elevacin (RHCE). Un plan RHCE puede ayudar a Engineer, materials y reducir la hinchazn, y, adems, a que el cuerpo se recupere. Reposo  Reduzca las actividades que realiza normalmente y evite usar la zona lesionada del cuerpo. Puede reanudar las actividades normales cuando se sienta bien y el mdico lo autorice. Hielo  No se aplique hielo directamente sobre la piel.  Ponga el hielo en una bolsa plstica.  Coloque una toalla entre la piel y la bolsa de hielo.  Coloque el hielo durante 20 minutos, 2 a 3 veces por da. Hgalo durante el tiempo que el mdico se lo haya indicado. Compresin  La compresin implica ejercer presin sobre la zona lesionada y se puede Education officer, environmental con IT consultant. Si se coloc una venda elstica:  Qutese y vuelva a colocarse la venda cada 3 o 4horas, o como se lo haya indicado el mdico.  Asegrese de que la venda no est muy ajustada. Afljela si una zona del cuerpo ms all de la venda se torna de color azul, est hinchada, se enfra o le causa dolor, o si pierde la sensibilidad en esa rea (adormecimiento).  Consulte al mdico si la venda parece [employer]. Elevacin  La elevacin implica mantener elevada la zona lesionada. Eleve la zona lesionada por encima del nivel del corazn o del centro del pecho si puede hacerlo. CUNDO PEDIR AYUDA? Debe solicitar ayuda si:  El dolor y la hinchazn continan.  Los sntomas empeoran. CUNDO DEBO OBTENER AYUDA DE INMEDIATO? Debe obtener ayuda de inmediato en los siguientes casos:  Siente un dolor intenso repentino en la zona de la lesin o por debajo de esta.  Tiene irritacin o ms hinchazn alrededor de la lesin.  Tiene hormigueo o adormecimiento en la zona de la lesin o por debajo de esta que no desaparecen despus de  quitarse la venda. Esta informacin no tiene Theme park manager el consejo del mdico. Asegrese de hacerle al mdico cualquier pregunta que tenga. Document Released: 11/06/2008 Document Revised: 11/02/2011 Document Reviewed: 07/18/2014 Elsevier Interactive Patient Education  2017 [employer].  -   Ejercicios para la espalda (Back Exercises) Si tiene dolor de espalda, haga estos ejercicios 2 o 3veces por da, o como se lo haya indicado el mdico. Cuando el dolor desaparezca, hgalos una vez por da, pero haga ms repeticiones de cada ejercicio. Si no le duele la espalda, haga estos ejercicios una vez por da o como se lo haya indicado el mdico. EJERCICIOS Rodilla al pecho Repita estos pasos 3 o 5veces seguidas con cada pierna: 1. Acustese boca arriba sobre una cama dura o sobre el suelo con las piernas extendidas. 2. Lleve una rodilla al pecho. 3. Mantenga la rodilla contra el pecho. Para lograrlo tmese la rodilla o el muslo. 4. Tire de la rodilla hasta sentir una elongacin suave en la parte baja de la espalda. 5. Mantenga la elongacin durante 10 a 30segundos. 6. Suelte y extienda la pierna lentamente. Inclinacin de la pelvis Repita estos pasos 5 o 10veces seguidas: 1. Acustese boca arriba sobre una cama dura o sobre el suelo con las piernas extendidas. 2. Flexione las rodillas de manera que apunten al techo. Los pies deben estar apoyados en el suelo. 3. Contraiga los msculos de la parte baja del vientre (abdomen) para empujar la zona lumbar contra  el suelo. Este movimiento har que el cccix apunte hacia el techo, en lugar de apuntar hacia abajo en direccin a los pies o al suelo. 4. Mantenga esta posicin durante 5 a 10segundos mientras contrae suavemente los msculos y respira con normalidad. El perro y el gato Repita estos pasos hasta que la zona lumbar se curve con ms facilidad: 1. Apoye las palmas de las manos y las rodillas sobre una superficie firme. Las manos  deben estar alineadas con los hombros y las rodillas con las caderas. Puede colocarse almohadillas debajo de las rodillas. 2. Deje caer la cabeza y lleve el cccix hacia abajo de modo que apunte en direccin al suelo para que la zona lumbar se arquee como el lomo de un gato [place]. 3. Mantenga esta posicin durante 5segundos. 4. Lentamente, levante la cabeza y lleve el cccix hacia arriba de modo que apunte en direccin al techo para que la espalda se arquee (hunda) como el lomo de un perro contento. 5. Mantenga esta posicin durante 5segundos. Flexiones de brazos Repita estos pasos 5 o 10veces seguidas: 1. Acustese boca abajo en el suelo. 2. Ponga las manos cerca de la cabeza, separadas aproximadamente al ancho de los hombros. 3. Con la espalda relajada y las caderas apoyadas en el suelo, extienda lentamente los brazos para levantar la mitad superior del cuerpo y Optometrist los hombros. No use los msculos de la espalda. Para estar ms cmodo, puede cambiar la [employer]. 4. Mantenga esta posicin durante 5segundos. 5. Lentamente vuelva a la posicin horizontal. Puentes Repita estos pasos 10veces seguidas: 1. Acustese boca arriba sobre una superficie firme. 2. Flexione las rodillas de manera que apunten al techo. Los pies deben estar apoyados en el suelo. 3. Contraiga los glteos y despegue las nalgas del suelo hasta que la cintura est casi a la altura de las rodillas. Si no siente el trabajo muscular en las nalgas y la parte posterior de los muslos, aleje los pies 1 o 2pulgadas (2,5 o 5centmetros) de las nalgas. 4. Mantenga esta posicin durante 3 a 5segundos. 5. Lentamente, vuelva a apoyar las nalgas en el suelo y relaje los glteos. Si este ejercicio le resulta muy fcil, intente realizarlo con los brazos cruzados [place]. Abdominales Repita estos pasos 5 o 10veces seguidas: 1. Acustese boca arriba sobre una cama dura o sobre el suelo con las piernas  extendidas. 2. Flexione las rodillas de manera que apunten al techo. Los pies deben estar apoyados en el suelo. 3. Cruce los [employer]. 4. Baje levemente el mentn en direccin al pecho, pero no doble el cuello. 5. Contraiga los msculos del abdomen y con lentitud eleve el pecho lo suficiente como para despegar levemente los omplatos del suelo. 6. Lentamente baje el pecho y la cabeza hasta el suelo. Elevaciones de espalda Repita estos pasos 5 o 10veces seguidas: 1. Acustese boca abajo con los brazos a los costados y apoye la frente en el suelo. 2. Contraiga los msculos de las piernas y los glteos. 3. Lentamente despegue el pecho del suelo mientras mantiene las caderas apoyadas en el suelo. Mantenga la nuca alineada con la curvatura de la espalda. Mire hacia el suelo mientras hace este ejercicio. 4. Mantenga esta posicin durante 3 a 5segundos. 5. Lentamente baje el pecho y el rostro hasta el suelo. SOLICITE AYUDA SI:  El dolor de espalda se vuelve mucho ms intenso cuando hace un ejercicio.  El dolor de espalda no se [place] 2horas despus de Radio producer  los ejercicios. Si tiene alguno de [employer], deje de [employer] ejercicios. No vuelva a hacer los ejercicios a menos que el mdico lo autorice. SOLICITE AYUDA DE INMEDIATO SI:  Siente un dolor sbito y muy intenso en la espalda. Si esto ocurre, deje de [employer]. No vuelva a hacer los ejercicios a menos que el mdico lo autorice. Esta informacin no tiene Theme park manager el consejo del mdico. Asegrese de hacerle al mdico cualquier pregunta que tenga. Document Released: 11/25/2010 Document Revised: 12/02/2015 Document Reviewed: 10/04/2014 Elsevier Interactive Patient Education  2017 [employer].
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
## 2016-12-02 ENCOUNTER — Encounter: Payer: Self-pay | Admitting: Internal Medicine

## 2016-12-02 NOTE — Telephone Encounter (Signed)
Could you please call patient for me

## 2016-12-02 NOTE — Telephone Encounter (Signed)
Please call Pt - with interpreter - tell her that I am happy to write a letter - but the point being I wanted to express yesterday: There are some risk to chiropractor with spine manipulation, although risk are rare.  It all depends on the experience of the chiropractor.   I did not completely advise her against it, but I emphasized that I did not know any of the area chiropractors and their skill levels. Some people may be more sore after the treatment than better - depends on the pt and chiropractor skills.  Her medical conditions alone DO NOT prevent her from seeing a chiropractor.  she does not need a referral to see a chiropractor. I advised her first to try RICE/warm heat/back stretches before she considers chiropractor thought.  If she does choose to see one,  I advised doing some research in the practitioner's experience, etc.

## 2016-12-28 IMAGING — US US ABDOMEN COMPLETE
1 series · 13 of 25 positions shown · non-contrast
Comparison: None.

CLINICAL DATA: Five-day history of right upper quadrant pain, known
esophageal varices and cirrhosis and hepatitis pain

EXAM:
ULTRASOUND ABDOMEN COMPLETE

[Series 1: us abdomen complete · 0.18mm/px · 13 of 108 slices shown]
[im 1/108]
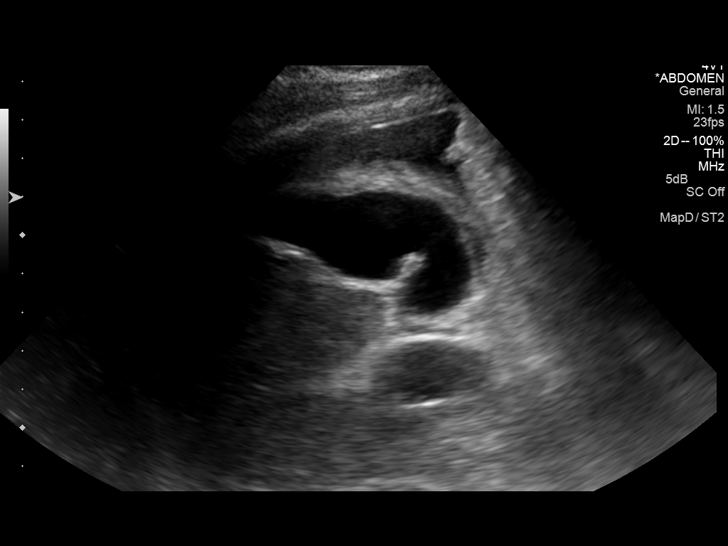
[im 9/108]
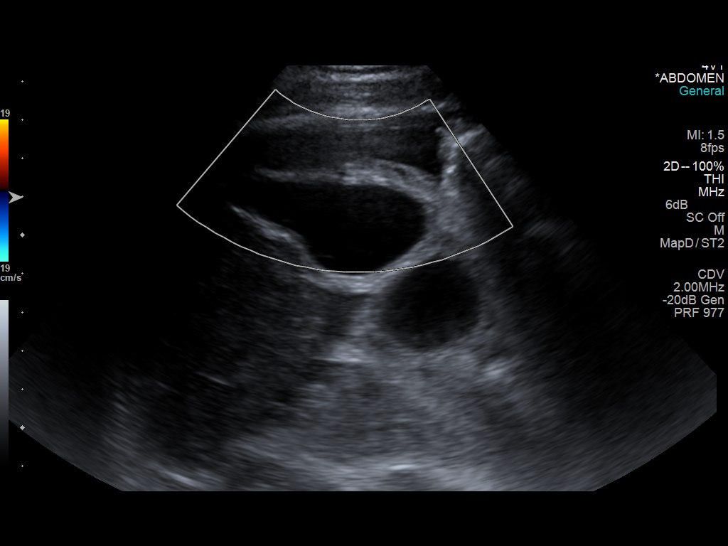
[im 18/108]
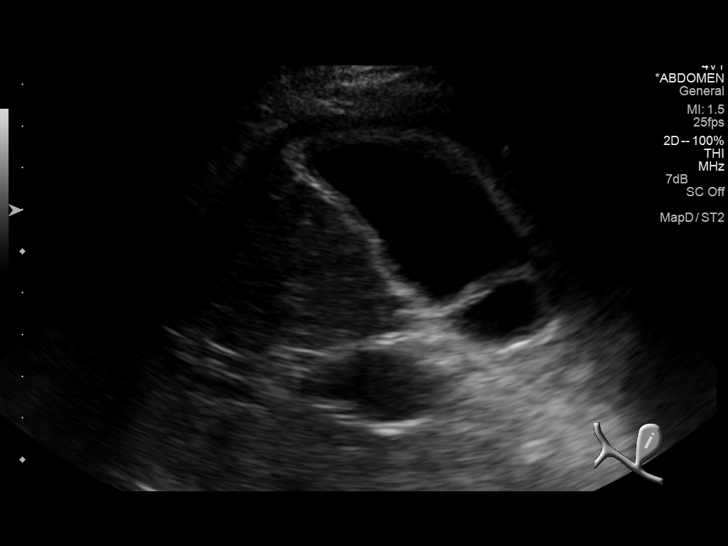
[im 27/108]
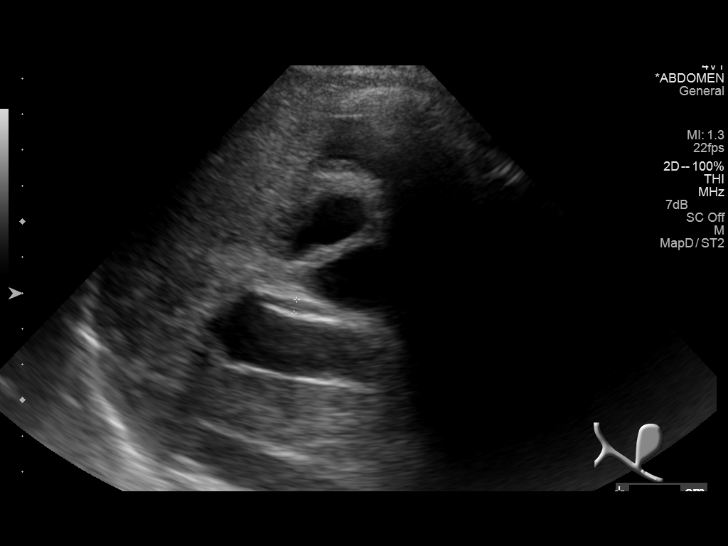
[im 36/108]
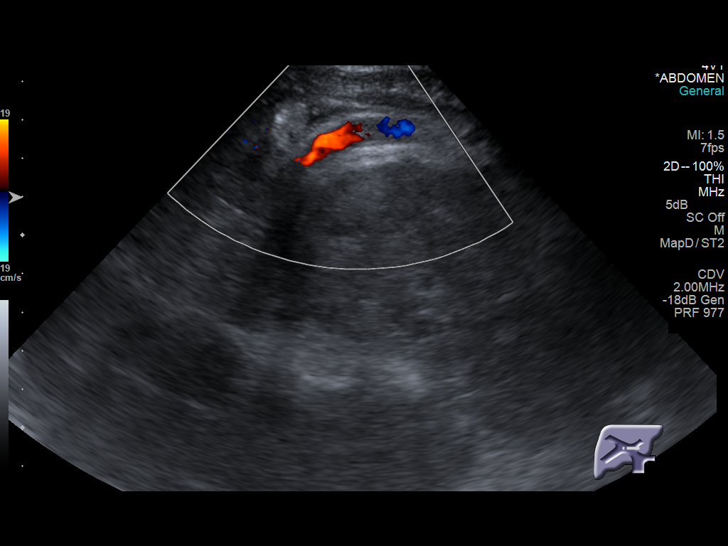
[im 45/108]
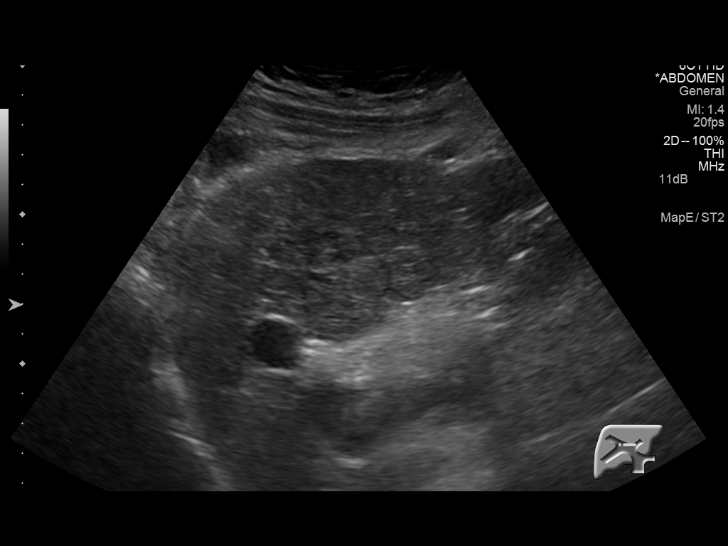
[im 54/108]
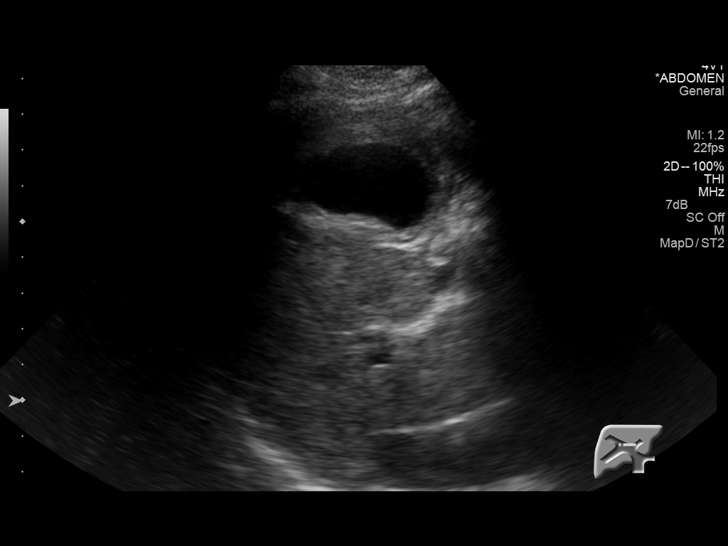
[im 63/108]
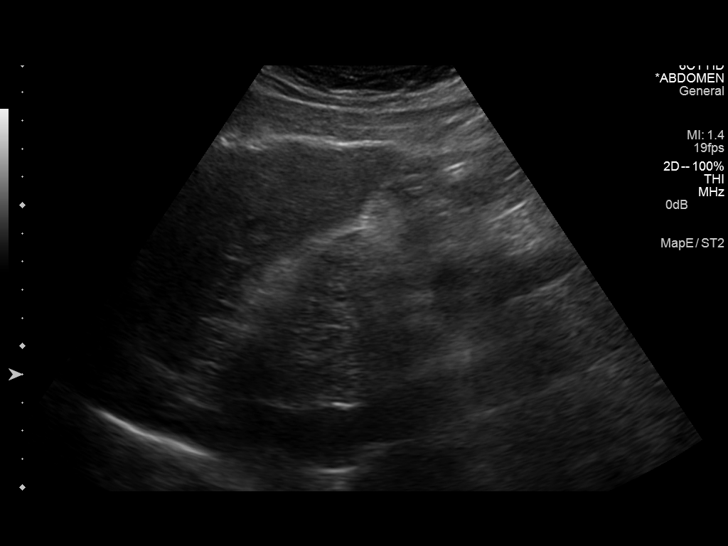
[im 72/108]
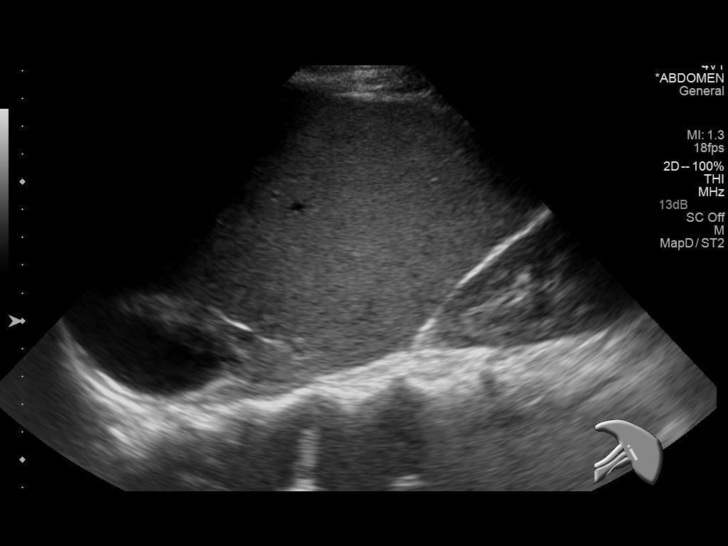
[im 81/108]
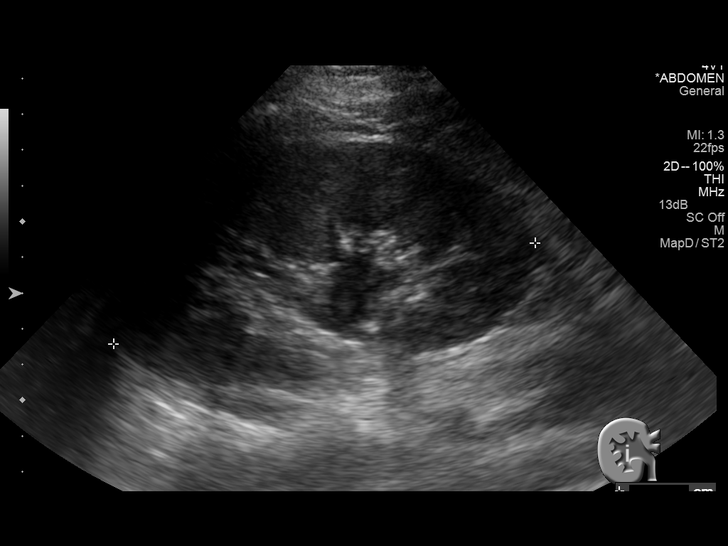
[im 90/108]
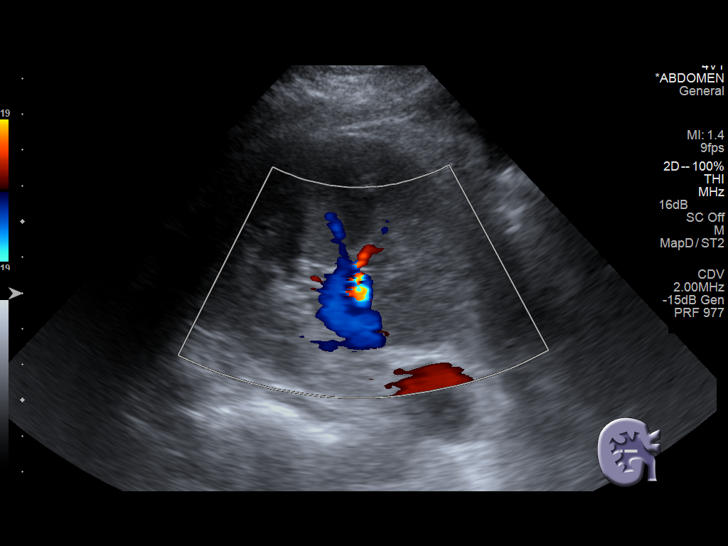
[im 99/108]
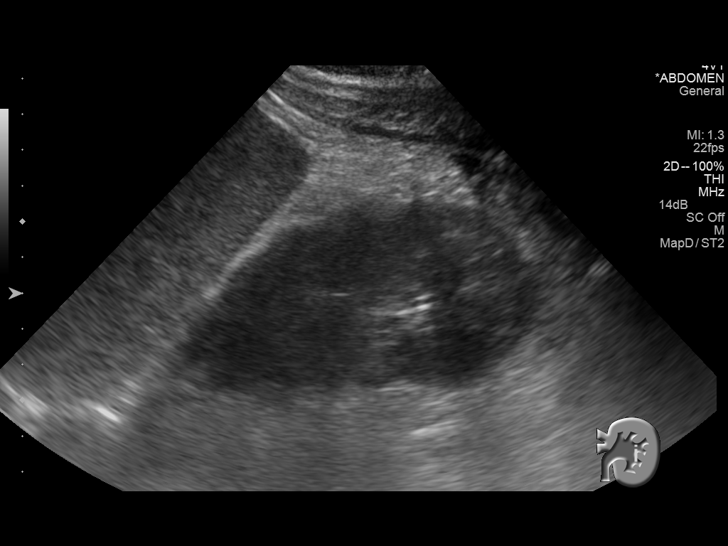
[im 108/108]
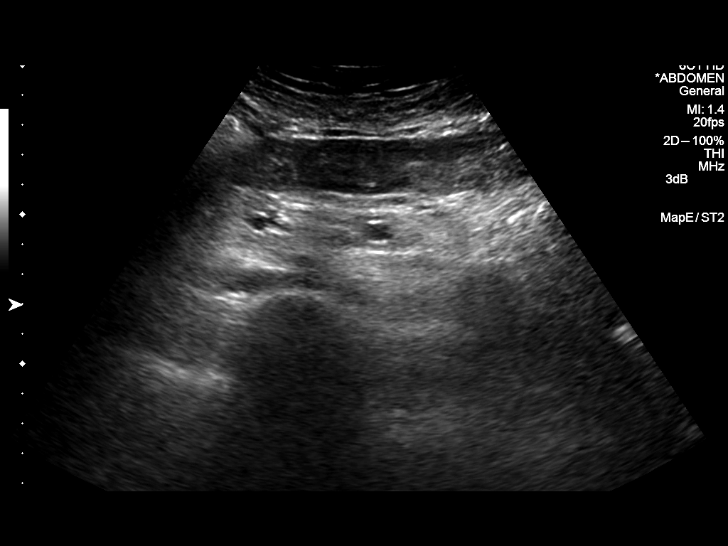

[13 of 25 positions shown; findings below may reference images not displayed]

FINDINGS: Gallbladder: The gallbladder wall is thickened but there is adjacent
ascites the wall measures 6.8 mm. No stones are evident. There is no
positive sonographic Murphy's sign.

Common bile duct: Diameter: 3.9 mm

Liver: The hepatic echotexture is heterogeneous. The surface contour
is nodular. There is no focal mass nor ductal dilation.

IVC: No abnormality visualized.

Pancreas: Evaluation the pancreas was limited by bowel gas.

Spleen: There is splenomegaly with splenic volume calculated at 710
cc

Right Kidney: Length: 12.1 cm. Echogenicity within normal limits. No
mass or hydronephrosis visualized.

Left Kidney: Length: 11.1 cm. There is mild splitting of the central
echo complex.

Abdominal aorta: No aneurysm visualized.

Other findings: Ascites is present.
IMPRESSION: 1. There are cirrhotic changes of the liver without focal masses.
There is splenomegaly.
2. There is mild gallbladder wall thickening without evidence of
stones. This is likely secondary to the presence of adjacent
ascites.
3. Mild splitting of the central echo complex of the left kidney may
reflect minimal acute or chronic hydronephrosis. This could be
evaluated with follow-up ultrasound examinations or with abdominal/
pelvic CT scanning.

## 2017-01-07 ENCOUNTER — Encounter: Payer: Self-pay | Admitting: Internal Medicine

## 2017-03-01 ENCOUNTER — Ambulatory Visit
Admission: RE | Admit: 2017-03-01 | Discharge: 2017-03-01 | Disposition: A | Discharge status: Home / Self Care | Payer: MEDICAID | Attending: Internal Medicine | Admitting: Internal Medicine

## 2017-03-01 DIAGNOSIS — K7460 Unspecified cirrhosis of liver: Principal | ICD-10-CM

## 2017-03-01 DIAGNOSIS — Z01818 Encounter for other preprocedural examination: Secondary | ICD-10-CM

## 2017-03-01 DIAGNOSIS — K7290 Hepatic failure, unspecified without coma: Secondary | ICD-10-CM

## 2017-03-01 DIAGNOSIS — K754 Autoimmune hepatitis: Secondary | ICD-10-CM

## 2017-03-01 MED ORDER — SPIRONOLACTONE 25 MG TABLET
ORAL_TABLET | Freq: Every day | ORAL | 11 refills | 0.00000 days | Status: CP
Start: 2017-03-01 — End: 2017-06-07

## 2017-03-02 ENCOUNTER — Ambulatory Visit: Payer: No Typology Code available for payment source | Attending: Family Medicine | Admitting: Family Medicine

## 2017-03-02 ENCOUNTER — Encounter: Payer: Self-pay | Admitting: Family Medicine

## 2017-03-02 VITALS — BP 96/62 | HR 58 | Temp 98.6°F | Resp 18 | Ht 62.0 in | Wt 162.4 lb

## 2017-03-02 DIAGNOSIS — K7460 Unspecified cirrhosis of liver: Secondary | ICD-10-CM | POA: Diagnosis not present

## 2017-03-02 DIAGNOSIS — Z09 Encounter for follow-up examination after completed treatment for conditions other than malignant neoplasm: Secondary | ICD-10-CM | POA: Insufficient documentation

## 2017-03-02 DIAGNOSIS — K754 Autoimmune hepatitis: Secondary | ICD-10-CM | POA: Diagnosis not present

## 2017-03-02 DIAGNOSIS — S161XXS Strain of muscle, fascia and tendon at neck level, sequela: Secondary | ICD-10-CM

## 2017-03-02 DIAGNOSIS — Z79899 Other long term (current) drug therapy: Secondary | ICD-10-CM | POA: Diagnosis not present

## 2017-03-02 MED ORDER — IBUPROFEN 600 MG PO TABS: 600.0000 mg | ORAL_TABLET | Freq: Three times a day (TID) | ORAL | 0 refills | Status: DC | PRN

## 2017-03-02 MED ORDER — METHOCARBAMOL 500 MG PO TABS: 500.0000 mg | ORAL_TABLET | Freq: Three times a day (TID) | ORAL | 0 refills | Status: DC | PRN

## 2017-03-02 NOTE — Patient Instructions (Signed)
Musculoskeletal Pain  Musculoskeletal pain is muscle and bone aches and pains. This pain can occur in any part of the body.  Follow these instructions at home:  · Only take medicines for pain, discomfort, or fever as told by your health care provider.  · You may continue all activities unless the activities cause more pain. When the pain lessens, slowly resume normal activities. Gradually increase the intensity and duration of the activities or exercise.  · During periods of severe pain, bed rest may be helpful. Lie or sit in any position that is comfortable, but get out of bed and walk around at least every several hours.  · If directed, put ice on the injured area.  ? Put ice in a plastic bag.  ? Place a towel between your skin and the bag.  ? Leave the ice on for 20 minutes, 2-3 times a day.  Contact a health care provider if:  · Your pain is getting worse.  · Your pain is not relieved with medicines.  · You lose function in the area of the pain if the pain is in your arms, legs, or neck.  This information is not intended to replace advice given to you by your health care provider. Make sure you discuss any questions you have with your health care provider.  Document Released: 08/10/2005 Document Revised: 01/21/2016 Document Reviewed: 04/14/2013  Elsevier Interactive Patient Education © 2017 Elsevier Inc.

## 2017-03-02 NOTE — Progress Notes (Signed)
Patient is here for f/up   Patient is on a waiting list for a transplant   Patient stated that on her MRI they found a mark on her liver so she wants to know if its cancerous
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
## 2017-04-01 DIAGNOSIS — K7460 Unspecified cirrhosis of liver: Principal | ICD-10-CM

## 2017-04-02 ENCOUNTER — Ambulatory Visit: Admission: RE | Admit: 2017-04-02 | Discharge: 2017-04-02 | Disposition: A | Discharge status: Home / Self Care

## 2017-04-02 ENCOUNTER — Ambulatory Visit
Admission: RE | Admit: 2017-04-02 | Discharge: 2017-04-02 | Disposition: A | Discharge status: Home / Self Care | Attending: Internal Medicine | Admitting: Internal Medicine

## 2017-04-02 DIAGNOSIS — K7460 Unspecified cirrhosis of liver: Principal | ICD-10-CM

## 2017-04-05 IMAGING — US US ABDOMEN LIMITED
1 series · 14 of 25 positions shown · non-contrast
Comparison: 03/19/2015

CLINICAL DATA: Hepatic cirrhosis, attention liver

EXAM:
US ABDOMEN LIMITED - RIGHT UPPER QUADRANT

[Series 1: us abdomen limited · 0.17mm/px · 14 of 74 slices shown]
[im 1/74]
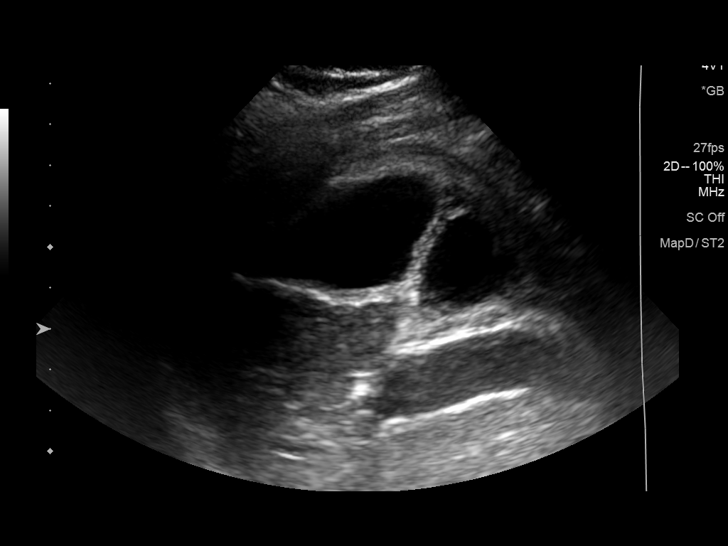
[im 7/74]
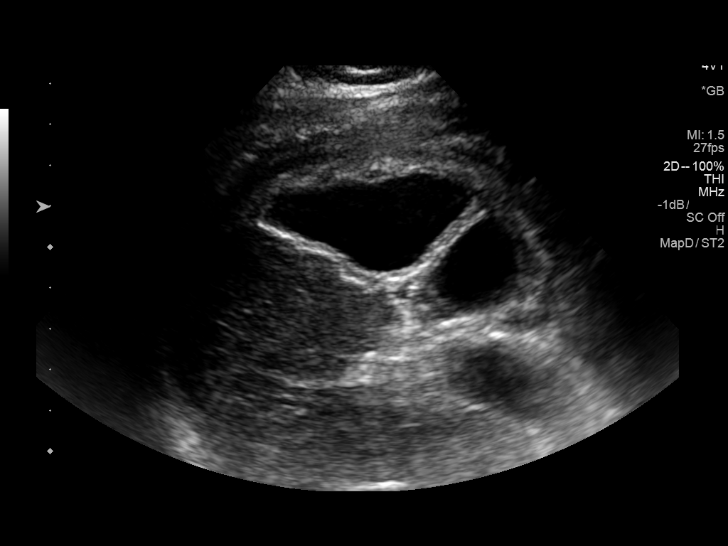
[im 13/74]
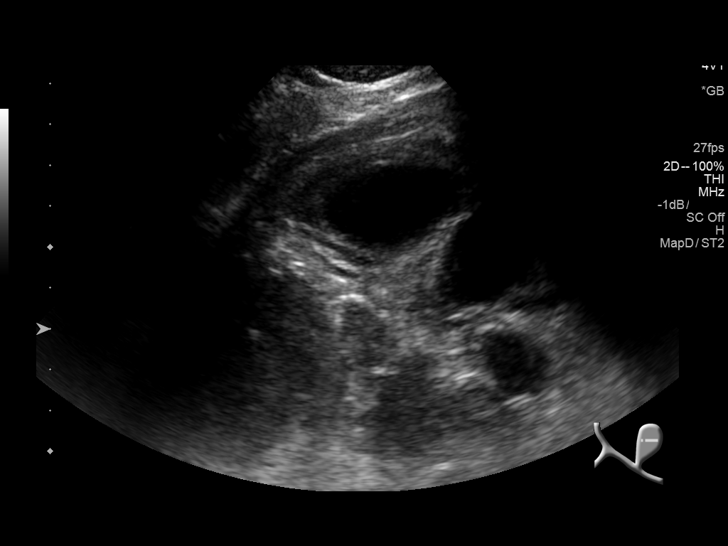
[im 19/74]
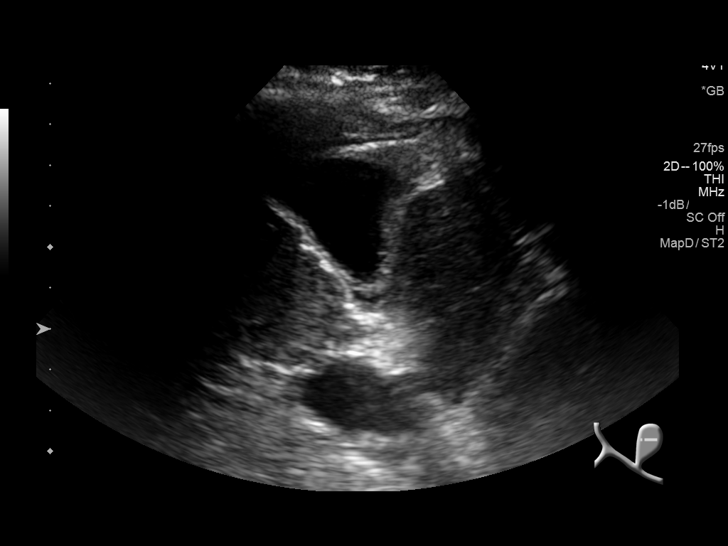
[im 25/74]
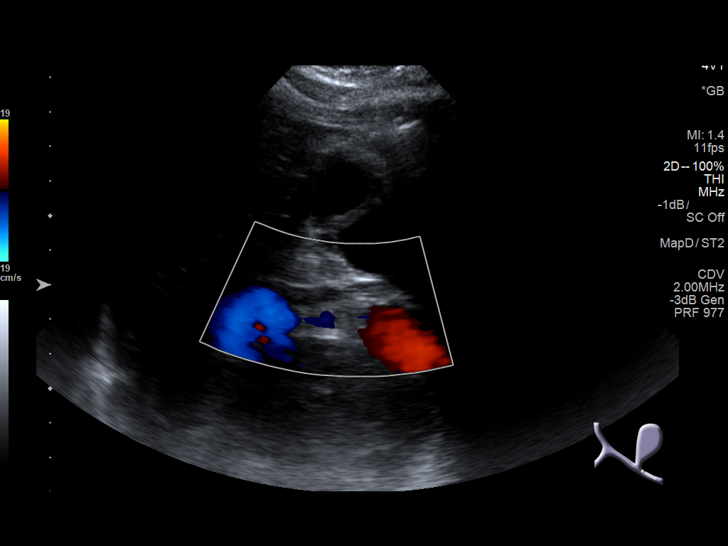
[im 28/74]
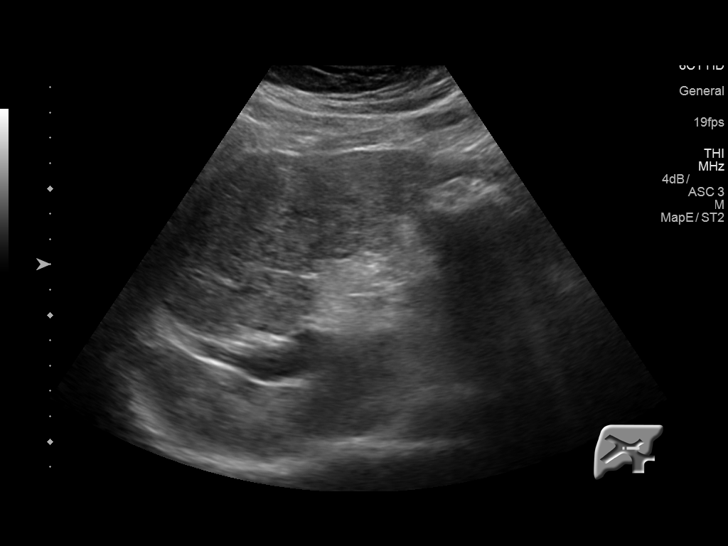
[im 34/74]
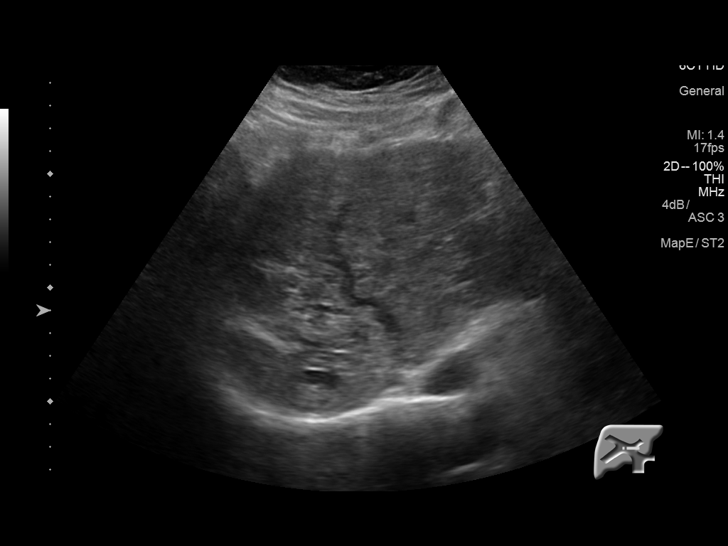
[im 40/74]
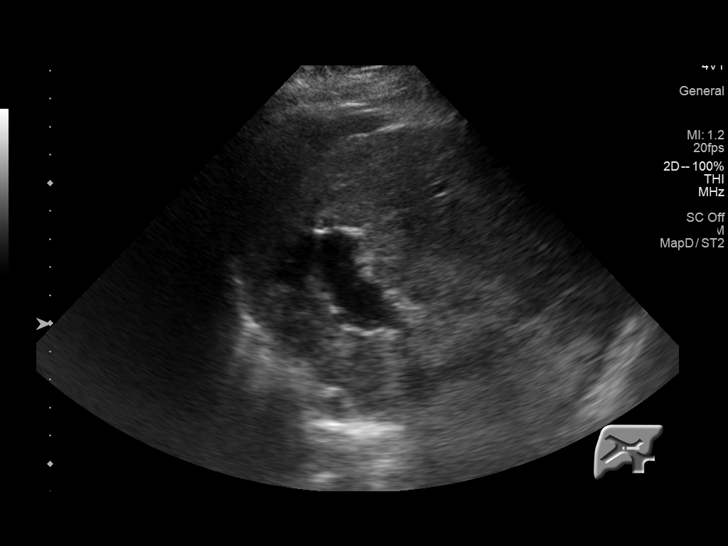
[im 46/74]
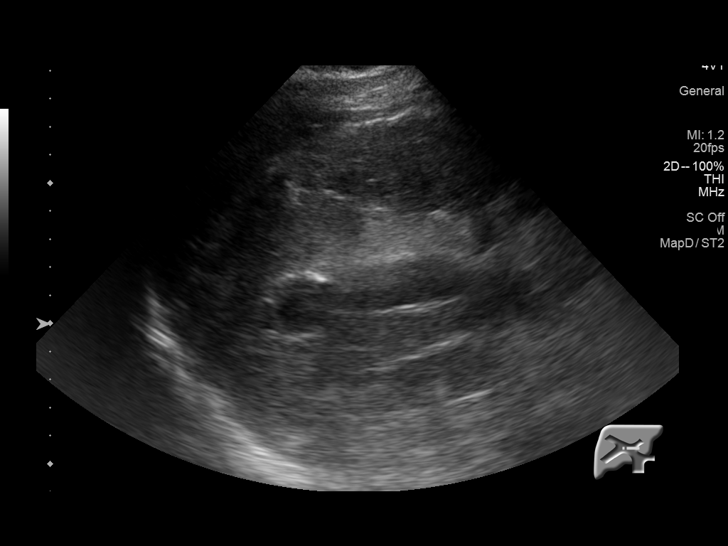
[im 49/74]
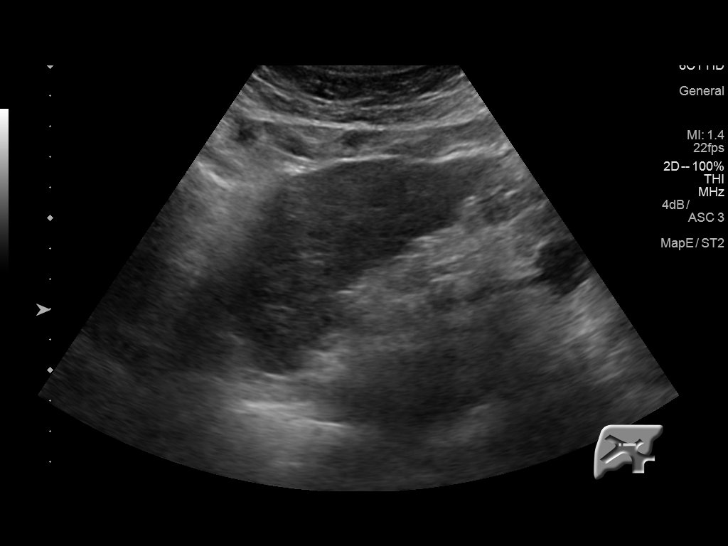
[im 55/74]
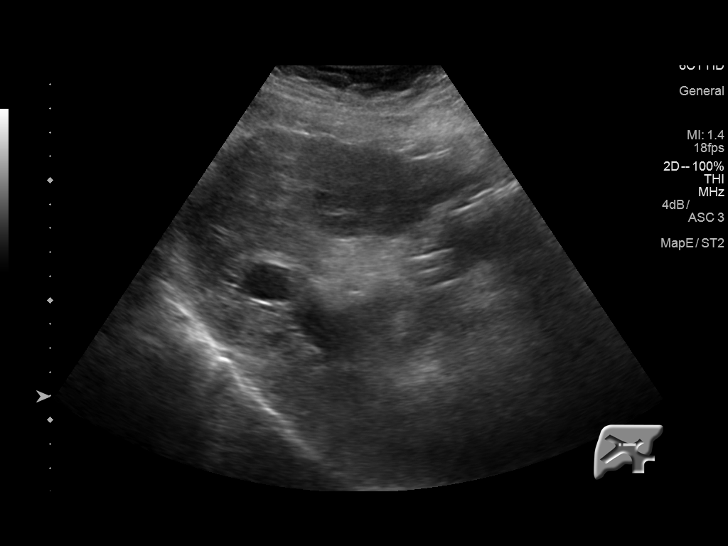
[im 61/74]
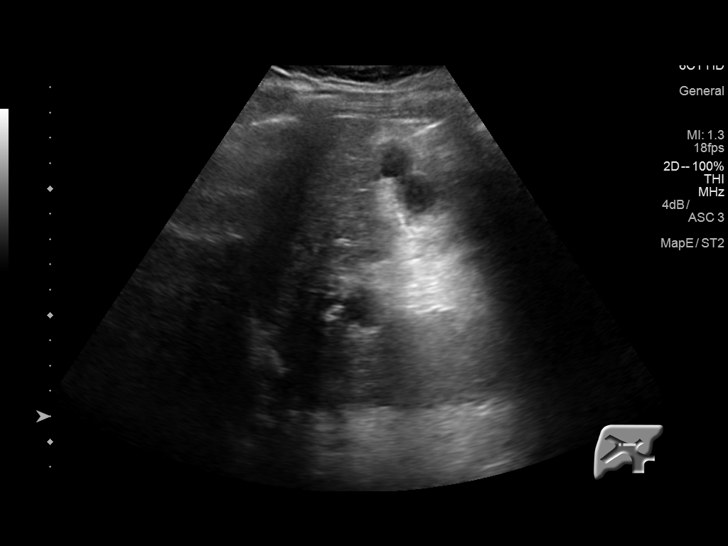
[im 67/74]
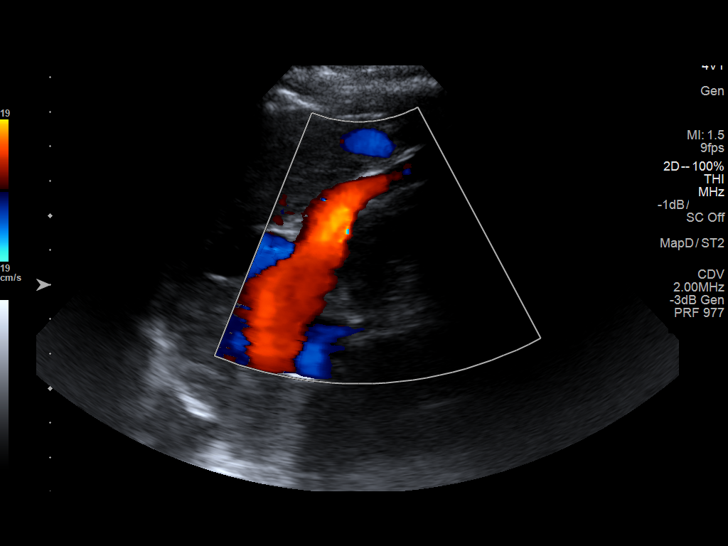
[im 74/74]
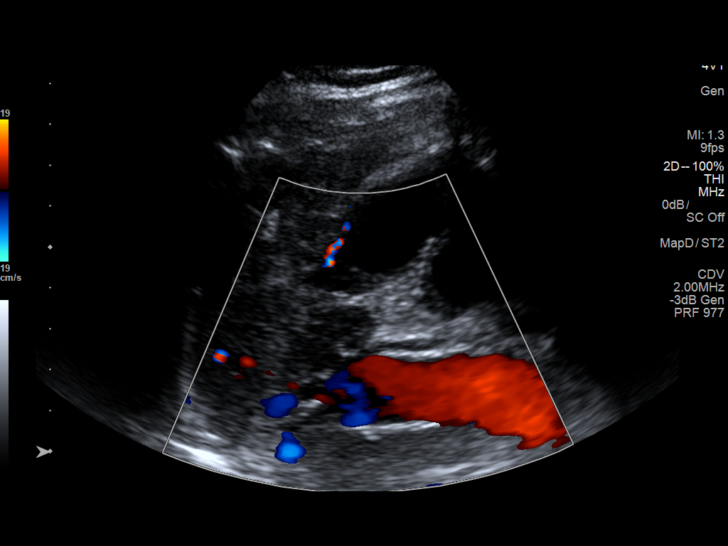

[14 of 25 positions shown; findings below may reference images not displayed]

FINDINGS: Gallbladder:

No gallstones are noted within gallbladder. Again noted thickening
of gallbladder wall up to 5 mm. O pericholecystic fluid. No
sonographic Murphy's sign

Common bile duct:

Diameter: 5.2 mm in diameter within normal limits.

Liver:

Again noted nodular contour of the liver and heterogeneous increased
echogenicity consistent with cirrhosis. No focal hepatic mass. No
intrahepatic biliary ductal dilatation.
IMPRESSION: 1. No gallstones are noted within gallbladder. Stable chronic mild
thickening of gallbladder wall without pericholecystic fluid or
sonographic Murphy's sign. Normal CBD. Again noted cirrhosis of the
liver without focal hepatic mass.

## 2017-06-01 ENCOUNTER — Encounter: Payer: Self-pay | Admitting: Family Medicine

## 2017-06-01 ENCOUNTER — Ambulatory Visit: Payer: Self-pay | Attending: Family Medicine | Admitting: Family Medicine

## 2017-06-01 VITALS — BP 106/70 | HR 62 | Temp 98.2°F | Resp 18 | Ht 62.0 in | Wt 167.8 lb

## 2017-06-01 DIAGNOSIS — Z79899 Other long term (current) drug therapy: Secondary | ICD-10-CM | POA: Insufficient documentation

## 2017-06-01 DIAGNOSIS — K7460 Unspecified cirrhosis of liver: Secondary | ICD-10-CM | POA: Insufficient documentation

## 2017-06-01 DIAGNOSIS — K754 Autoimmune hepatitis: Secondary | ICD-10-CM | POA: Insufficient documentation

## 2017-06-01 DIAGNOSIS — R140 Abdominal distension (gaseous): Secondary | ICD-10-CM | POA: Insufficient documentation

## 2017-06-01 DIAGNOSIS — K769 Liver disease, unspecified: Secondary | ICD-10-CM

## 2017-06-01 DIAGNOSIS — N189 Chronic kidney disease, unspecified: Secondary | ICD-10-CM | POA: Insufficient documentation

## 2017-06-01 DIAGNOSIS — G4700 Insomnia, unspecified: Secondary | ICD-10-CM | POA: Insufficient documentation

## 2017-06-01 DIAGNOSIS — Z23 Encounter for immunization: Secondary | ICD-10-CM | POA: Insufficient documentation

## 2017-06-01 DIAGNOSIS — R1900 Intra-abdominal and pelvic swelling, mass and lump, unspecified site: Secondary | ICD-10-CM | POA: Insufficient documentation

## 2017-06-01 MED ORDER — FUROSEMIDE 20 MG PO TABS: ORAL_TABLET | ORAL | 0 refills | Status: DC

## 2017-06-01 MED ORDER — TRAZODONE HCL 50 MG PO TABS: 25.0000 mg | ORAL_TABLET | Freq: Every evening | ORAL | 0 refills | Status: DC | PRN

## 2017-06-01 NOTE — Progress Notes (Signed)
Patient is here for f/up   Patient complains liver pain  Every time she eats

## 2017-06-01 NOTE — Progress Notes (Deleted)
   Subjective:  Patient ID: Yolanda Guzman, female    DOB: Apr 27, 1976  Age: 41 y.o. MRN: [id number]  CC: No chief complaint on file.   HPI Yolanda Guzman presents for ***   Weight increse  Poor appetitie  Bloated and swelling  Nausea  Clay colored stools yesterday night   yellow dark  No swelling  Fatigue  Bursing easliy  Nose bleed  2 weeks ago  Severe  Cant sleep at night  Most hours of sleep 3 to 5  Worry   Trazaode    Flu      Outpatient Medications Prior to Visit  Medication Sig Dispense Refill  . AZATHIOPRINE PO Take 125 mg by mouth daily. 2.5 tabs total; Per DR Jacqualine Mau, [facility] Hepatology    . ciprofloxacin (CIPRO) 500 MG tablet Take 500 mg by mouth daily.    . fluconazole (DIFLUCAN) 150 MG tablet Take 1 tablet (150 mg total) by mouth daily. (Patient not taking: Reported on 09/08/2016) 1 tablet 0  . furosemide (LASIX) 20 MG tablet Take 1 tablet (20 mg total) by mouth daily. 30 tablet 3  . ibuprofen (ADVIL,MOTRIN) 600 MG tablet Take 1 tablet (600 mg total) by mouth every 8 (eight) hours as needed for moderate pain (Take with food.). 30 tablet 0  . methocarbamol (ROBAXIN) 500 MG tablet Take 1 tablet (500 mg total) by mouth every 8 (eight) hours as needed for muscle spasms. 15 tablet 0  . nystatin (MYCOSTATIN/NYSTOP) powder Apply topically 4 (four) times daily. (Patient not taking: Reported on 12/01/2016) 15 g 0  . omeprazole (PRILOSEC) 40 MG capsule Take 1 capsule (40 mg total) by mouth daily. 90 capsule 3  . potassium chloride SA (K-DUR,KLOR-CON) 20 MEQ tablet Take 1 tablet (20 mEq total) by mouth daily. (Patient not taking: Reported on 12/01/2016) 5 tablet 0  . propranolol (INDERAL) 20 MG tablet Take 1 tablet (20 mg total) by mouth 2 (two) times daily. 90 tablet 3  . spironolactone (ALDACTONE) 25 MG tablet TAKE 1 TABLET (25 MG TOTAL) BY MOUTH DAILY. 30 tablet 2   No facility-administered medications prior to visit.     ROS Review of Systems     Objective:  BP 106/70 (BP  Location: Left Arm, Patient Position: Sitting, Cuff Size: Normal)   Pulse 62   Temp 98.2 F (36.8 C) (Oral)   Resp 18   Ht  (1.575 m)   Wt 167 lb 12.8 oz (76.1 kg)   SpO2 100%   BMI 30.69 kg/m   BP/Weight 06/01/2017 03/02/2017 12/01/2016  Systolic BP 106 96 100  Diastolic BP 70 62 63  Wt. (Lbs) 167.8 162.4 154.4  BMI 30.69 29.7 27.19     Physical Exam   Assessment & Plan:   Problem List Items Addressed This Visit    None      No orders of the defined types were placed in this encounter.   Follow-up: No Follow-up on file.   Lizbeth Bark FNP

## 2017-06-02 ENCOUNTER — Ambulatory Visit: Payer: Self-pay | Attending: Family Medicine

## 2017-06-02 DIAGNOSIS — K754 Autoimmune hepatitis: Secondary | ICD-10-CM | POA: Insufficient documentation

## 2017-06-02 DIAGNOSIS — K769 Liver disease, unspecified: Secondary | ICD-10-CM | POA: Insufficient documentation

## 2017-06-02 DIAGNOSIS — K7460 Unspecified cirrhosis of liver: Secondary | ICD-10-CM | POA: Insufficient documentation

## 2017-06-02 NOTE — Progress Notes (Signed)
Patient here for lab visit  

## 2017-06-02 NOTE — Progress Notes (Signed)
Subjective:  Patient ID: Yolanda Guzman, female    DOB: October 15, 1975  Age: 41 y.o. MRN: [id number]  CC: Bloated   HPI Kla Mussa presents for follow up. History of autoimmune hepatitis, ESLD, and cirrhosis. She is receiving care from [facility] gastroenterology and  transplant team. She complains of abdominal bloating and swelling. Associated symptoms include poor appetite, fatigue, she complains of insomnia. nausea, weight increase. She reports clay-colored stool bowel movement last night. She reports upcoming gastroenterologist appointment.  She complains of insomnia associated symptoms include excessive worry. Factors chronic liver disease  and prognosis. She reports getting only 3-5 hours of sleep per night.  Outpatient Medications Prior to Visit  Medication Sig Dispense Refill  . ciprofloxacin (CIPRO) 500 MG tablet Take 500 mg by mouth daily.    Marland Kitchen ibuprofen (ADVIL,MOTRIN) 600 MG tablet Take 1 tablet (600 mg total) by mouth every 8 (eight) hours as needed for moderate pain (Take with food.). 30 tablet 0  . methocarbamol (ROBAXIN) 500 MG tablet Take 1 tablet (500 mg total) by mouth every 8 (eight) hours as needed for muscle spasms. 15 tablet 0  . nystatin (MYCOSTATIN/NYSTOP) powder Apply topically 4 (four) times daily. (Patient not taking: Reported on 12/01/2016) 15 g 0  . omeprazole (PRILOSEC) 40 MG capsule Take 1 capsule (40 mg total) by mouth daily. 90 capsule 3  . propranolol (INDERAL) 20 MG tablet Take 1 tablet (20 mg total) by mouth 2 (two) times daily. 90 tablet 3  . AZATHIOPRINE PO Take 125 mg by mouth daily. 2.5 tabs total; Per DR Jacqualine Mau, [facility] Hepatology    . fluconazole (DIFLUCAN) 150 MG tablet Take 1 tablet (150 mg total) by mouth daily. (Patient not taking: Reported on 09/08/2016) 1 tablet 0  . furosemide (LASIX) 20 MG tablet Take 1 tablet (20 mg total) by mouth daily. 30 tablet 3  . potassium chloride SA (K-DUR,KLOR-CON) 20 MEQ tablet Take 1 tablet (20 mEq total) by mouth daily. (Patient not  taking: Reported on 12/01/2016) 5 tablet 0  . spironolactone (ALDACTONE) 25 MG tablet TAKE 1 TABLET (25 MG TOTAL) BY MOUTH DAILY. 30 tablet 2   No facility-administered medications prior to visit.     ROS Review of Systems  Constitutional: Positive for fatigue and unexpected weight change.  Respiratory: Negative.   Cardiovascular: Negative.   Gastrointestinal: Positive for abdominal distention.  Skin: Negative.   Psychiatric/Behavioral: Positive for sleep disturbance. The patient is nervous/anxious.     Objective:  BP 106/70 (BP Location: Left Arm, Patient Position: Sitting, Cuff Size: Normal)   Pulse 62   Temp 98.2 F (36.8 C) (Oral)   Resp 18   Ht  (1.575 m)   Wt 167 lb 12.8 oz (76.1 kg)   SpO2 100%   BMI 30.69 kg/m   BP/Weight 06/01/2017 03/02/2017 12/01/2016  Systolic BP 106 96 100  Diastolic BP 70 62 63  Wt. (Lbs) 167.8 162.4 154.4  BMI 30.69 29.7 27.19   Physical Exam  Constitutional: She appears well-developed and well-nourished.  HENT:  Mouth/Throat: Oropharynx is clear and moist.  Eyes: Pupils are equal, round, and reactive to light. Conjunctivae are normal.  Neck: No JVD present.  Cardiovascular: Normal rate, regular rhythm, normal heart sounds and intact distal pulses.   Pulmonary/Chest: Effort normal and breath sounds normal. No respiratory distress.  No crackles present.  Abdominal: Soft. Bowel sounds are normal. She exhibits no distension. There is no tenderness. There is no guarding.  Musculoskeletal: She exhibits no edema.  Lymphadenopathy:  She has no cervical adenopathy.  Skin: Skin is warm and dry.  Psychiatric: She has a normal mood and affect.  Nursing note and vitals reviewed.   Assessment & Plan:   1. Autoimmune hepatitis (HCC)  - CMP and Liver; Future - CBC; Future  2. Needs flu shot  - Flu Vaccine QUAD 6+ mos PF IM (Fluarix Quad PF)  3. Liver disease, chronic, with cirrhosis (HCC)  - CMP and Liver; Future - CBC;  Future  4. Insomnia, unspecified type  - traZODone (DESYREL) 50 MG tablet; Take 0.5-1 tablets (25-50 mg total) by mouth at bedtime as needed for sleep.  Dispense: 30 tablet; Refill: 0  5. Abdominal swelling No signs of respiratory distress or crackles. Slight weight increase since previous office visit 3 months ago. Guzman add additional Lasix dose for now. Patient encouraged to keep follow-up appointment with gastroenterologist. Return precautions given. - furosemide (LASIX) 20 MG tablet; TAKE HALF A TABLET BY MOUTH EVERY OTHER DAY AS NEEDED FOR SWELLING. TAKE IN ADDITION TO DAILY LASIX DOSE.  Dispense: 5 tablet; Refill: 0   Meds ordered this encounter  Medications  . traZODone (DESYREL) 50 MG tablet    Sig: Take 0.5-1 tablets (25-50 mg total) by mouth at bedtime as needed for sleep.    Dispense:  30 tablet    Refill:  0    Order Specific Question:   Supervising Provider    Answer:   Quentin Angst [id number]  . furosemide (LASIX) 20 MG tablet    Sig: TAKE HALF A TABLET BY MOUTH EVERY OTHER DAY AS NEEDED FOR SWELLING. TAKE IN ADDITION TO DAILY LASIX DOSE.    Dispense:  5 tablet    Refill:  0    TAKE IN ADDITION TO DAILY LASIX DOSE.    Order Specific Question:   Supervising Provider    Answer:   Quentin Angst [[id number]]      Lizbeth Bark FNP

## 2017-06-04 ENCOUNTER — Telehealth: Payer: Self-pay | Admitting: Family Medicine

## 2017-06-04 ENCOUNTER — Other Ambulatory Visit: Payer: Self-pay | Admitting: Family Medicine

## 2017-06-04 ENCOUNTER — Emergency Department
Admission: EM | Admit: 2017-06-04 | Discharge: 2017-06-04 | Disposition: A | Discharge status: Home / Self Care | Payer: MEDICAID | Source: Intra-hospital | Attending: Physician Assistant | Admitting: Physician Assistant

## 2017-06-04 LAB — CBC
HEMOGLOBIN: 11 g/dL — AB (ref 11.1–15.9)
Hematocrit: 33.9 % — ABNORMAL LOW (ref 34.0–46.6)
MCH: 29.3 pg (ref 26.6–33.0)
MCHC: 32.4 g/dL (ref 31.5–35.7)
MCV: 90 fL (ref 79–97)
PLATELETS: 27 10*3/uL — AB (ref 150–379)
RBC: 3.75 x10E6/uL — AB (ref 3.77–5.28)
RDW: 16.1 % — ABNORMAL HIGH (ref 12.3–15.4)
WBC: 1.3 10*3/uL — AB (ref 3.4–10.8)

## 2017-06-04 LAB — CMP AND LIVER
ALK PHOS: 110 IU/L (ref 39–117)
ALT: 24 IU/L (ref 0–32)
AST: 43 IU/L — AB (ref 0–40)
Albumin: 3 g/dL — ABNORMAL LOW (ref 3.5–5.5)
BUN: 3 mg/dL — ABNORMAL LOW (ref 6–24)
Bilirubin Total: 1.2 mg/dL (ref 0.0–1.2)
Bilirubin, Direct: 0.31 mg/dL (ref 0.00–0.40)
CO2: 22 mmol/L (ref 20–29)
CREATININE: 0.53 mg/dL — AB (ref 0.57–1.00)
Calcium: 7.8 mg/dL — ABNORMAL LOW (ref 8.7–10.2)
Chloride: 106 mmol/L (ref 96–106)
GFR calc non Af Amer: 119 mL/min/{1.73_m2} (ref >59)
GFR, EST AFRICAN AMERICAN: 137 mL/min/{1.73_m2} (ref >59)
GLUCOSE: 197 mg/dL — AB (ref 65–99)
POTASSIUM: 4.1 mmol/L (ref 3.5–5.2)
SODIUM: 139 mmol/L (ref 134–144)
Total Protein: 5.6 g/dL — ABNORMAL LOW (ref 6.0–8.5)

## 2017-06-04 NOTE — Telephone Encounter (Signed)
Interpreter services used [place] [phone]. Spoke with patient's husband who is also emergency contact. Informed of patient's critical lab results. Recommendation for Mrs.Manni  to go the the Emergency Department as soon as possible today for further follow up. He communicates understanding.

## 2017-06-07 ENCOUNTER — Ambulatory Visit
Admission: RE | Admit: 2017-06-07 | Discharge: 2017-06-07 | Disposition: A | Discharge status: Home / Self Care | Payer: MEDICAID | Attending: Internal Medicine | Admitting: Internal Medicine

## 2017-06-07 DIAGNOSIS — K652 Spontaneous bacterial peritonitis: Secondary | ICD-10-CM

## 2017-06-07 DIAGNOSIS — R188 Other ascites: Secondary | ICD-10-CM

## 2017-06-07 DIAGNOSIS — K754 Autoimmune hepatitis: Principal | ICD-10-CM

## 2017-06-07 DIAGNOSIS — K7460 Unspecified cirrhosis of liver: Secondary | ICD-10-CM

## 2017-06-07 MED ORDER — SPIRONOLACTONE 25 MG TABLET
ORAL_TABLET | Freq: Every day | ORAL | 3 refills | 0.00000 days | Status: CP
Start: 2017-06-07 — End: 2017-12-17

## 2017-06-07 MED ORDER — FUROSEMIDE 40 MG TABLET
ORAL_TABLET | Freq: Every day | ORAL | 11 refills | 0 days | Status: CP
Start: 2017-06-07 — End: 2017-12-17

## 2017-06-07 MED ORDER — LACTULOSE 20 GRAM/30 ML ORAL SOLUTION
Freq: Every day | ORAL | 11 refills | 0.00000 days | Status: CP
Start: 2017-06-07 — End: 2017-12-17

## 2017-06-07 MED ORDER — CIPROFLOXACIN 250 MG TABLET
ORAL_TABLET | ORAL | 3 refills | 0.00000 days | Status: CP
Start: 2017-06-07 — End: 2017-12-17

## 2017-06-08 ENCOUNTER — Ambulatory Visit: Payer: Medicaid Other | Admitting: Family Medicine

## 2017-09-06 ENCOUNTER — Ambulatory Visit: Admit: 2017-09-06 | Discharge: 2017-09-07

## 2017-09-06 DIAGNOSIS — K7290 Hepatic failure, unspecified without coma: Secondary | ICD-10-CM

## 2017-09-06 DIAGNOSIS — Z01818 Encounter for other preprocedural examination: Secondary | ICD-10-CM

## 2017-09-06 DIAGNOSIS — K754 Autoimmune hepatitis: Principal | ICD-10-CM

## 2017-11-25 ENCOUNTER — Ambulatory Visit: Admit: 2017-11-25 | Discharge: 2017-11-26 | Attending: Internal Medicine | Primary: Internal Medicine

## 2017-11-25 DIAGNOSIS — R188 Other ascites: Secondary | ICD-10-CM

## 2017-11-25 DIAGNOSIS — K7460 Unspecified cirrhosis of liver: Principal | ICD-10-CM

## 2017-12-16 ENCOUNTER — Other Ambulatory Visit: Payer: Self-pay | Admitting: Gastroenterology

## 2017-12-17 MED ORDER — AZATHIOPRINE 50 MG TABLET
ORAL_TABLET | Freq: Every day | ORAL | 11 refills | 0.00000 days | Status: CP
Start: 2017-12-17 — End: 2018-10-21

## 2017-12-17 MED ORDER — CIPROFLOXACIN 250 MG TABLET
ORAL_TABLET | ORAL | 3 refills | 0 days | Status: CP
Start: 2017-12-17 — End: 2018-10-21

## 2017-12-17 MED ORDER — FUROSEMIDE 40 MG TABLET
ORAL_TABLET | Freq: Every day | ORAL | 11 refills | 0 days | Status: CP
Start: 2017-12-17 — End: 2019-03-24

## 2017-12-17 MED ORDER — PROPRANOLOL 20 MG TABLET
ORAL_TABLET | Freq: Two times a day (BID) | ORAL | 11 refills | 0.00000 days | Status: CP
Start: 2017-12-17 — End: 2019-03-24

## 2017-12-17 MED ORDER — SPIRONOLACTONE 25 MG TABLET
ORAL_TABLET | Freq: Every day | ORAL | 11 refills | 0 days | Status: CP
Start: 2017-12-17 — End: 2019-03-24

## 2017-12-17 MED ORDER — LACTULOSE 20 GRAM/30 ML ORAL SOLUTION
Freq: Every day | ORAL | 11 refills | 0.00000 days | Status: CP
Start: 2017-12-17 — End: 2019-03-24

## 2018-05-16 ENCOUNTER — Ambulatory Visit: Admit: 2018-05-16 | Discharge: 2018-05-17 | Attending: Internal Medicine | Primary: Internal Medicine

## 2018-05-16 DIAGNOSIS — K7460 Unspecified cirrhosis of liver: Principal | ICD-10-CM

## 2018-05-24 ENCOUNTER — Ambulatory Visit: Admit: 2018-05-24 | Discharge: 2018-05-25

## 2018-05-24 DIAGNOSIS — K7460 Unspecified cirrhosis of liver: Principal | ICD-10-CM

## 2018-06-09 ENCOUNTER — Ambulatory Visit: Payer: Self-pay | Admitting: Family Medicine

## 2018-06-27 ENCOUNTER — Encounter: Payer: Self-pay | Admitting: Family Medicine

## 2018-06-27 ENCOUNTER — Other Ambulatory Visit: Payer: Self-pay

## 2018-06-27 ENCOUNTER — Ambulatory Visit: Payer: Self-pay | Attending: Family Medicine | Admitting: Family Medicine

## 2018-06-27 VITALS — BP 112/74 | HR 69 | Temp 98.7°F | Resp 18 | Ht 62.0 in | Wt 167.0 lb

## 2018-06-27 DIAGNOSIS — N941 Unspecified dyspareunia: Secondary | ICD-10-CM

## 2018-06-27 DIAGNOSIS — G4700 Insomnia, unspecified: Secondary | ICD-10-CM

## 2018-06-27 DIAGNOSIS — R609 Edema, unspecified: Secondary | ICD-10-CM

## 2018-06-27 DIAGNOSIS — K7460 Unspecified cirrhosis of liver: Secondary | ICD-10-CM | POA: Insufficient documentation

## 2018-06-27 DIAGNOSIS — Z880 Allergy status to penicillin: Secondary | ICD-10-CM | POA: Insufficient documentation

## 2018-06-27 DIAGNOSIS — R600 Localized edema: Secondary | ICD-10-CM

## 2018-06-27 DIAGNOSIS — K219 Gastro-esophageal reflux disease without esophagitis: Secondary | ICD-10-CM

## 2018-06-27 DIAGNOSIS — Z8249 Family history of ischemic heart disease and other diseases of the circulatory system: Secondary | ICD-10-CM | POA: Insufficient documentation

## 2018-06-27 DIAGNOSIS — Z833 Family history of diabetes mellitus: Secondary | ICD-10-CM | POA: Insufficient documentation

## 2018-06-27 DIAGNOSIS — Z79899 Other long term (current) drug therapy: Secondary | ICD-10-CM

## 2018-06-27 DIAGNOSIS — E559 Vitamin D deficiency, unspecified: Secondary | ICD-10-CM

## 2018-06-27 DIAGNOSIS — Z23 Encounter for immunization: Secondary | ICD-10-CM

## 2018-06-27 DIAGNOSIS — K754 Autoimmune hepatitis: Secondary | ICD-10-CM

## 2018-06-27 MED ORDER — OMEPRAZOLE 40 MG PO CPDR: 40.0000 mg | DELAYED_RELEASE_CAPSULE | Freq: Every day | ORAL | 3 refills | Status: DC

## 2018-06-27 MED ORDER — FUROSEMIDE 40 MG PO TABS: 40.0000 mg | ORAL_TABLET | Freq: Every day | ORAL | 5 refills | Status: AC

## 2018-06-27 MED ORDER — SPIRONOLACTONE 25 MG PO TABS: 25.0000 mg | ORAL_TABLET | Freq: Every day | ORAL | 5 refills | Status: DC

## 2018-06-27 MED ORDER — PROPRANOLOL HCL 20 MG PO TABS: 20.0000 mg | ORAL_TABLET | Freq: Two times a day (BID) | ORAL | 5 refills | Status: AC

## 2018-06-27 MED ORDER — TRAZODONE HCL 50 MG PO TABS: 25.0000 mg | ORAL_TABLET | Freq: Every evening | ORAL | 3 refills | Status: DC | PRN

## 2018-06-27 NOTE — Progress Notes (Signed)
Flu: yes  pain: not currently present but sometimes pinch on right side, comes and goes.   Physical  requested   Requested a refill on medications

## 2018-06-27 NOTE — Progress Notes (Signed)
Subjective:    Patient ID: Yolanda Guzman, female    DOB: 08/23/1976, 42 y.o.   MRN: [id number]  HPI 42 year old female who was last seen in the office on 06/01/2017 who presents secondary to complaint of having discomfort with sexual intercourse this past weekend.  Patient denies any vaginal discharge.  Patient has a history of autoimmune hepatitis for which she is followed at [facility].  Patient reports that she will need refills of all of her medications at today's visit.  Patient states that she is due for mammogram but has been told that they want her to have her mammogram done at [facility].  Patient denies any current dysuria.  Patient has some abdominal discomfort but this is mostly in the right upper quadrant.  Patient does continue to have issues with acid reflux and needs a refill of omeprazole.  Patient denies any recent nausea or vomiting.  Patient continues to have issues with some peripheral edema, needs refill of Lasix.  Patient is also on other medications from GI which she needs to have refilled at today's visit.  Patient still has occasional difficulty with sleep. Past Medical History:  Diagnosis Date  . Hepatic cirrhosis (HCC)    Past Surgical History:  Procedure Laterality Date  . CESAREAN SECTION    . LIVER BIOPSY  03-2015   Family History  Problem Relation Age of Onset  . Hypertension Mother   . Diabetes Father    Social History   Tobacco Use  . Smoking status: Never Smoker  . Smokeless tobacco: Never Used  Substance Use Topics  . Alcohol use: No    Alcohol/week: 0.0 standard drinks  . Drug use: No   Allergies  Allergen Reactions  . Penicillins       Review of Systems  Constitutional: Positive for fatigue. Negative for chills and fever.  HENT: Negative for sore throat and trouble swallowing.   Respiratory: Negative for cough and shortness of breath.   Cardiovascular: Positive for leg swelling. Negative for chest pain and palpitations.    Gastrointestinal: Positive for abdominal pain. Negative for blood in stool.  Genitourinary: Negative for dysuria and frequency.  Musculoskeletal: Negative for arthralgias and back pain.  Neurological: Negative for dizziness and headaches.       Objective:   Physical Exam BP 112/74   Pulse 69   Temp 98.7 F (37.1 C) (Oral)   Resp 18   Ht 5\' 2"  (1.575 m)   Wt 167 lb (75.8 kg)   LMP 06/17/2018   SpO2 99%   BMI 30.54 kg/m Nurse's note and vital signs reviewed General-well-nourished, well-developed adult female in no acute distress Neck-supple, no lymphadenopathy, no thyromegaly, no carotid bruit Lungs-clear to auscultation bilaterally Cardiovascular-regular rate and rhythm, patient was presence of a soft murmur Abdomen-soft, patient with mild right upper quadrant tenderness to palpation, no rebound or guarding Back-no CVA tenderness Extremities- patient with bilateral nonpitting distal lower extremity edema        Assessment & Plan:  1. Dyspareunia in female Patient with complaint of discomfort with sexual intercourse x1 this weekend.  Patient will have urinalysis and urine cytology to look for possible STI/BV or yeast as well as possible UTI.  If symptoms do not resolve, patient should return to clinic for pelvic exam. - Urine cytology ancillary only  2. Autoimmune hepatitis [facility]) Patient with autoimmune hepatitis which is followed by [facility].  Patient is given refill of Lasix, Inderal and Spironolactone - furosemide (LASIX)  40 MG tablet; Take 1 tablet (40 mg total) by mouth daily.  Dispense: 30 tablet; Refill: 5 - propranolol (INDERAL) 20 MG tablet; Take 1 tablet (20 mg total) by mouth 2 (two) times daily.  Dispense: 60 tablet; Refill: 5 - spironolactone (ALDACTONE) 25 MG tablet; Take 1 tablet (25 mg total) by mouth daily.  Dispense: 30 tablet; Refill: 5 - Basic Metabolic Panel  3. Gastroesophageal reflux disease, esophagitis presence not specified Patient provided  with refill of omeprazole for acid reflux symptoms.  Patient is also aware that she should avoid late night eating as well as avoidance of known trigger foods - omeprazole (PRILOSEC) 40 MG capsule; Take 1 capsule (40 mg total) by mouth daily.  Dispense: 90 capsule; Refill: 3  4. Insomnia, unspecified type Patient provided with refill of trazodone to take as needed for difficulty sleeping - traZODone (DESYREL) 50 MG tablet; Take 0.5-1 tablets (25-50 mg total) by mouth at bedtime as needed for sleep.  Dispense: 30 tablet; Refill: 3  5. Vitamin D deficiency Patient has had past vitamin D deficiency and will have repeat vitamin D level and will be notified if any additional supplementation is needed based on these results - Vitamin D, 25-hydroxy  6. Encounter for long-term (current) use of medications Patient will have BMP at today's visit in follow-up of long-term use of medications - Basic Metabolic Panel  7. Peripheral edema Patient with peripheral edema which is most likely related to her autoimmune hepatitis with cirrhosis.  Patient will continue the use of Lasix.  Patient should keep her legs elevated throughout the day when possible.  8. Need for immunization against influenza Patient was offered and received influenza immunization at today's visit.  Patient was also given informational handout - Flu Vaccine QUAD 36+ mos IM  An After Visit Summary was printed and given to the patient.  Return in about 6 months (around 12/26/2018), or if symptoms worsen or fail to improve-sooner.

## 2018-06-28 LAB — BASIC METABOLIC PANEL WITH GFR
BUN/Creatinine Ratio: 15 (ref 9–23)
BUN: 6 mg/dL (ref 6–24)
CO2: 22 mmol/L (ref 20–29)
Calcium: 7.1 mg/dL — ABNORMAL LOW (ref 8.7–10.2)
Chloride: 106 mmol/L (ref 96–106)
Creatinine, Ser: 0.41 mg/dL — ABNORMAL LOW (ref 0.57–1.00)
GFR calc Af Amer: 147 mL/min/1.73
GFR calc non Af Amer: 128 mL/min/1.73
Glucose: 86 mg/dL (ref 65–99)
Potassium: 3.2 mmol/L — ABNORMAL LOW (ref 3.5–5.2)
Sodium: 138 mmol/L (ref 134–144)

## 2018-06-28 LAB — URINE CYTOLOGY ANCILLARY ONLY
Chlamydia: NEGATIVE
Neisseria Gonorrhea: NEGATIVE
Trichomonas: NEGATIVE

## 2018-06-28 LAB — VITAMIN D 25 HYDROXY (VIT D DEFICIENCY, FRACTURES): Vit D, 25-Hydroxy: 41.7 ng/mL (ref 30.0–100.0)

## 2018-06-29 ENCOUNTER — Other Ambulatory Visit: Payer: Self-pay | Admitting: Family Medicine

## 2018-06-29 DIAGNOSIS — E876 Hypokalemia: Secondary | ICD-10-CM

## 2018-06-29 LAB — URINE CYTOLOGY ANCILLARY ONLY
Bacterial vaginitis: NEGATIVE
Candida vaginitis: NEGATIVE

## 2018-06-29 NOTE — Progress Notes (Signed)
Patient ID: Yolanda Guzman, female   DOB: [birth year], 42 y.o.   MRN: [id number]   Patient with recent labs showing hypokalemia with a potassium level of 3.2.  Patient also with low calcium level of 7.1.  CMA will contact patient regarding the need to increase dietary potassium and return for repeat potassium level.  Patient will also have PTH and calcium level done to look for possible cause of her low calcium level.  Patient's vitamin D level was normal with recent labs.

## 2018-07-04 ENCOUNTER — Telehealth: Payer: Self-pay | Admitting: *Deleted

## 2018-07-04 NOTE — Telephone Encounter (Signed)
Medical Assistant used [employer] to contact patient.  Interpreter Name: Reece Leader #: [id number] Patient is aware of needing to increase potassium and have a recheck completed. Patient is also aware of medications being refilled to [facility].

## 2018-07-04 NOTE — Telephone Encounter (Signed)
-----   Message from Cain Saupe, MD sent at 06/29/2018  5:32 PM EST ----- Please notify patient that her urine ancillary test was negative for infection. Patient did have low potassium at 3.2 and needs to increase dietary potassium for a few days by drinking a glass of orange juice or having a banana once per day for the next 5 days and I would like for her to return to repeat her potassium level and calcium as her calcium level was also low (lab visit only is okay). Her Vitamin D level was normal.

## 2018-10-21 MED ORDER — CIPROFLOXACIN 250 MG TABLET
ORAL_TABLET | ORAL | 3 refills | 0 days | Status: CP
Start: 2018-10-21 — End: 2019-10-21

## 2018-10-21 MED ORDER — AZATHIOPRINE 50 MG TABLET
ORAL_TABLET | Freq: Every day | ORAL | 11 refills | 0.00000 days | Status: CP
Start: 2018-10-21 — End: 2019-03-24

## 2019-01-30 ENCOUNTER — Encounter: Admit: 2019-01-30 | Discharge: 2019-01-31 | Payer: MEDICAID | Attending: Internal Medicine | Primary: Internal Medicine

## 2019-01-30 DIAGNOSIS — K7460 Unspecified cirrhosis of liver: Principal | ICD-10-CM

## 2019-01-30 DIAGNOSIS — R188 Other ascites: Secondary | ICD-10-CM

## 2019-03-27 MED ORDER — AZATHIOPRINE 50 MG TABLET
ORAL_TABLET | Freq: Every day | ORAL | 11 refills | 30 days | Status: CP
Start: 2019-03-27 — End: 2020-03-26

## 2019-03-27 MED ORDER — SPIRONOLACTONE 50 MG TABLET
ORAL_TABLET | Freq: Every day | ORAL | 11 refills | 30.00000 days | Status: CP
Start: 2019-03-27 — End: 2020-03-26

## 2019-03-27 MED ORDER — LACTULOSE 20 GRAM/30 ML ORAL SOLUTION
Freq: Every day | ORAL | 11 refills | 30.00000 days | Status: CP
Start: 2019-03-27 — End: 2020-03-26

## 2019-03-27 MED ORDER — FUROSEMIDE 40 MG TABLET
ORAL_TABLET | Freq: Every day | ORAL | 11 refills | 20 days | Status: CP
Start: 2019-03-27 — End: 2020-03-26

## 2019-03-27 MED ORDER — PROPRANOLOL 20 MG TABLET
ORAL_TABLET | Freq: Two times a day (BID) | ORAL | 11 refills | 30 days | Status: CP
Start: 2019-03-27 — End: 2020-03-26

## 2019-05-09 ENCOUNTER — Ambulatory Visit: Admit: 2019-05-09 | Discharge: 2019-05-10

## 2019-05-09 DIAGNOSIS — K7469 Other cirrhosis of liver: Secondary | ICD-10-CM

## 2019-05-09 DIAGNOSIS — Z23 Encounter for immunization: Secondary | ICD-10-CM

## 2019-05-09 DIAGNOSIS — R188 Other ascites: Secondary | ICD-10-CM

## 2019-05-09 DIAGNOSIS — K7460 Unspecified cirrhosis of liver: Secondary | ICD-10-CM

## 2019-05-09 DIAGNOSIS — K754 Autoimmune hepatitis: Secondary | ICD-10-CM

## 2019-05-09 MED ORDER — SPIRONOLACTONE 50 MG TABLET
ORAL_TABLET | Freq: Every day | ORAL | 11 refills | 30 days | Status: CP
Start: 2019-05-09 — End: 2019-05-11

## 2019-05-09 MED ORDER — FUROSEMIDE 40 MG TABLET
ORAL_TABLET | Freq: Every day | ORAL | 11 refills | 30 days
Start: 2019-05-09 — End: 2019-05-11

## 2019-05-11 ENCOUNTER — Encounter: Admit: 2019-05-11 | Discharge: 2019-05-12 | Payer: MEDICAID | Attending: Internal Medicine | Primary: Internal Medicine

## 2019-05-11 DIAGNOSIS — K754 Autoimmune hepatitis: Secondary | ICD-10-CM

## 2019-05-11 DIAGNOSIS — K7460 Unspecified cirrhosis of liver: Secondary | ICD-10-CM

## 2019-05-11 DIAGNOSIS — R188 Other ascites: Secondary | ICD-10-CM

## 2019-05-11 MED ORDER — FUROSEMIDE 40 MG TABLET
ORAL_TABLET | Freq: Every day | ORAL | 11 refills | 20 days | Status: CP
Start: 2019-05-11 — End: 2020-05-10

## 2019-05-11 MED ORDER — SPIRONOLACTONE 50 MG TABLET
ORAL_TABLET | Freq: Every day | ORAL | 11 refills | 30.00000 days | Status: CP
Start: 2019-05-11 — End: 2020-05-10

## 2019-05-23 ENCOUNTER — Ambulatory Visit: Admit: 2019-05-23 | Discharge: 2019-05-24

## 2019-05-23 DIAGNOSIS — K7460 Unspecified cirrhosis of liver: Secondary | ICD-10-CM

## 2019-05-23 DIAGNOSIS — K766 Portal hypertension: Secondary | ICD-10-CM

## 2019-05-23 DIAGNOSIS — R188 Other ascites: Secondary | ICD-10-CM

## 2019-05-23 DIAGNOSIS — R161 Splenomegaly, not elsewhere classified: Secondary | ICD-10-CM

## 2019-10-26 DIAGNOSIS — K652 Spontaneous bacterial peritonitis: Principal | ICD-10-CM

## 2019-11-24 ENCOUNTER — Encounter: Admit: 2019-11-24 | Discharge: 2019-11-24 | Disposition: A | Discharge status: Home / Self Care | Payer: MEDICAID

## 2019-11-24 DIAGNOSIS — R079 Chest pain, unspecified: Principal | ICD-10-CM

## 2019-11-30 ENCOUNTER — Ambulatory Visit: Payer: BLUE CROSS/BLUE SHIELD | Attending: Family | Admitting: Family

## 2019-11-30 ENCOUNTER — Encounter: Payer: Self-pay | Admitting: Family

## 2019-11-30 ENCOUNTER — Other Ambulatory Visit: Payer: Self-pay

## 2019-11-30 VITALS — BP 115/73 | HR 59 | Temp 97.9°F | Resp 16 | Ht 62.0 in | Wt 185.2 lb

## 2019-11-30 DIAGNOSIS — R188 Other ascites: Principal | ICD-10-CM

## 2019-11-30 DIAGNOSIS — Z129 Encounter for screening for malignant neoplasm, site unspecified: Principal | ICD-10-CM

## 2019-11-30 DIAGNOSIS — K7460 Unspecified cirrhosis of liver: Principal | ICD-10-CM

## 2019-11-30 DIAGNOSIS — N644 Mastodynia: Secondary | ICD-10-CM

## 2019-11-30 NOTE — Patient Instructions (Signed)
Referral to mammogram at [facility].  Mammogram A mammogram is an X-ray of the breasts that is done to check for changes that are not normal. This test can screen for and find any changes that may suggest breast cancer. Mammograms are regularly done on women. A man may have a mammogram if he has a lump or swelling in his breast. This test can also help to find other changes and variations in the breast. Tell a doctor:  About any allergies you have.  If you have breast implants.  If you have had previous breast disease, biopsy, or surgery.  If you are breastfeeding.  If you are younger than age 46.  If you have a family history of breast cancer.  Whether you are pregnant or may be pregnant. What are the risks? Generally, this is a safe procedure. However, problems may occur, including:  Exposure to radiation. Radiation levels are very low with this test.  The results being misinterpreted.  The need for further tests.  The inability of the mammogram to detect certain cancers. What happens before the procedure?  Have this test done about 1-2 weeks after your period. This is usually when your breasts are the least tender.  If you are visiting a new doctor or clinic, send any past mammogram images to your new doctor's office.  Wash your breasts and under your arms the day of the test.  Do not use deodorants, perfumes, lotions, or powders on the day of the test.  Take off any jewelry from your neck.  Wear clothes that you can change into and out of easily. What happens during the procedure?   You will undress from the waist up. You will put on a gown.  You will stand in front of the X-ray machine.  Each breast will be placed between two plastic or glass plates. The plates will press down on your breast for a few seconds. Try to stay as relaxed as possible. This does not cause any harm to your breasts. Any discomfort you feel will be very brief.  X-rays will be taken from  different angles of each breast. The procedure may vary among doctors and hospitals. What happens after the procedure?  The mammogram will be read by a specialist (radiologist).  You may need to do certain parts of the test again. This depends on the quality of the images.  Ask when your test results will be ready. Make sure you get your test results.  You may go back to your normal activities. Summary  A mammogram is a low energy X-ray of the breasts that is done to check for abnormal changes. A man may have this test if he has a lump or swelling in his breast.  Before the procedure, tell your doctor about any breast problems that you have had in the past.  Have this test done about 1-2 weeks after your period.  For the test, each breast will be placed between two plastic or glass plates. The plates will press down on your breast for a few seconds.  The mammogram will be read by a specialist (radiologist). Ask when your test results will be ready. Make sure you get your test results. This information is not intended to replace advice given to you by your health care provider. Make sure you discuss any questions you have with your health care provider. Document Revised: 03/31/2018 Document Reviewed: 03/31/2018 Elsevier Patient Education  2020 [employer].

## 2019-11-30 NOTE — Progress Notes (Signed)
Patient ID: Yolanda Guzman, female    DOB: [birth year]  MRN: [id number]  CC: RIGHT BREAST PAIN  Subjective: Yolanda Guzman is a 44 y.o. female with history of cirrhosis, autoimmune hepatitis, RUQ abdominal pain, and vitamin D deficiency. Her concerns today include: right breast pain.  1. BREAST PAIN FOLLOW-UP:  When did it begin: 14 days ago  Where in the breast or axilla does the pain occur: right breast, underneath right breast, side of right breast, right nipple itching Is the pain bilateral: denies What does the pain feel like: can not describe How severe is the pain: 4-5/10. Takes Tylenol and cream for numbness which only helps for sleeping. Reports was told to use Lidocaine patch for numbness but when she went to the store to purchase it she was unable to find it. States she found a replacement for Lidocaine patch but can't recall name. Reports pain is stable and lasts all day.  Is it phasic, peaks at midcycle or premenstrually: denies Is it associated with the use of oral contraceptive pills or hormone replacement therapy: denies Did it begin after a recent birth or pregnancy loss or termination: denies Is it related to vigorous or repetitive use of pectoral muscles group: Patient reports this may be a possibility. 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 Reports also that she has a second job cleaning houses and noticed a pulling sensation on the right arm and shoulder area one day.   Is there a concurrent neck, back, or shoulder problem: denies Are there systemic or other local symptoms, such as fever or erythema: denies History of recent trauma to the chest: denies Does the pain affect ability to perform daily activities of living: yes Pain rating: 4-5/10 Have you taken anything for the pain: Tylenol and numbness cream Noticed any changes in the breast: denies Noticed any skin changes: denies Any nipple discharge: Feels  like right nipple is wet a little bit sometimes but doesn't see it any actual discharge.  History of breast cancer: denies History of abnormal mammogram: Yes, 2017 Last mammogram: 2017. Impression of mammogram resulted no suspicious cause for bilateral yellow nipple discharge identified. Worrisome nipple discharge is typically unilateral, clear or bloody, and spontaneous. There were fibrocystic changes seen as well. The recommendation was patient's discharge is not typical for papilloma or malignancy given the bilateral nature and yellow color. Recommendation for annual screening mammography.  Comments: Reaching down to pick things up makes pain feels worse.Reports right side of back and right lung feels painful if she takes a deep breath.  Patient has history of 3 recent visits to the emergency department for the same reason.   Last visit 11/24/2019 with Dr. Collene Schlichter at [facility] emergency department. During that encounter CTA unrevealing for any acute etiology. EKG normal. Fracture, ACS, aortic pathology, and pneumonia ruled out. Counseled to use Tylenol for discomfort and numbing patches purchased over-the-counter. Referred to primary provider in regards to a mammogram or breast ultrasound.    Visit on 11/23/2019 with Dr. Charm Barges at [facility] emergency department. During that encounter chest x-ray negative. EKG sinus brady without ischemic changes. Patient eloped prior to completing evaluation.  Visit 11/20/2019 at [facility] emergency department with Dr. Elliot Gurney. During that encounter Tylenol and Motrin recommended as needed for pain and to follow-up with primary provider for possible ultrasound or mammogram.   Patient Active Problem List   Diagnosis Date Noted  .  Autoimmune hepatitis (HCC) 03/24/2016  . Vitamin D deficiency 03/16/2016  . RUQ abdominal pain 11/02/2014  . Cirrhosis (HCC) 11/02/2014     Current Outpatient Medications on File  Prior to Visit  Medication Sig Dispense Refill  . ciprofloxacin (CIPRO) 500 MG tablet Take 500 mg by mouth daily.    . furosemide (LASIX) 40 MG tablet Take 1 tablet (40 mg total) by mouth daily. 30 tablet 5  . ibuprofen (ADVIL,MOTRIN) 600 MG tablet Take 1 tablet (600 mg total) by mouth every 8 (eight) hours as needed for moderate pain (Take with food.). 30 tablet 0  . omeprazole (PRILOSEC) 40 MG capsule Take 1 capsule (40 mg total) by mouth daily. 90 capsule 3  . propranolol (INDERAL) 20 MG tablet Take 1 tablet (20 mg total) by mouth 2 (two) times daily. 60 tablet 5  . spironolactone (ALDACTONE) 25 MG tablet Take 1 tablet (25 mg total) by mouth daily. 30 tablet 5  . traZODone (DESYREL) 50 MG tablet Take 0.5-1 tablets (25-50 mg total) by mouth at bedtime as needed for sleep. 30 tablet 3   No current facility-administered medications on file prior to visit.    Allergies  Allergen Reactions  . Penicillins     Social History   Socioeconomic History  . Marital status: Married    Spouse name: Not on file  . Number of children: 2  . Years of education: Not on file  . Highest education level: Not on file  Occupational History  . Not on file  Tobacco Use  . Smoking status: Never Smoker  . Smokeless tobacco: Never Used  Substance and Sexual Activity  . Alcohol use: No    Alcohol/week: 0.0 standard drinks  . Drug use: No  . Sexual activity: Yes  Other Topics Concern  . Not on file  Social History Narrative  . Not on file   Social Determinants of Health   Financial Resource Strain:   . Difficulty of Paying Living Expenses:   Food Insecurity:   . Worried About Programme researcher, broadcasting/film/video in the Last Year:   . Barista in the Last Year:   Transportation Needs:   . Freight forwarder (Medical):   Marland Kitchen Lack of Transportation (Non-Medical):   Physical Activity:   . Days of Exercise per Week:   . Minutes of Exercise per Session:   Stress:   . Feeling of Stress :   Social  Connections:   . Frequency of Communication with Friends and Family:   . Frequency of Social Gatherings with Friends and Family:   . Attends Religious Services:   . Active Member of Clubs or Organizations:   . Attends Banker Meetings:   Marland Kitchen Marital Status:   Intimate Partner Violence:   . Fear of Current or Ex-Partner:   . Emotionally Abused:   Marland Kitchen Physically Abused:   . Sexually Abused:     Family History  Problem Relation Age of Onset  . Hypertension Mother   . Diabetes Father     Past Surgical History:  Procedure Laterality Date  . CESAREAN SECTION    . LIVER BIOPSY  03-2015    ROS: Review of Systems Negative except as stated above  PHYSICAL EXAM: Vitals with BMI 11/30/2019 06/27/2018 06/01/2017  Height 5\' 2"  5\' 2"  5\' 2"   Weight 185 lbs 3 oz 167 lbs 167 lbs 13 oz  BMI 33.86 30.54 30.68  Systolic 115 112  Diastolic 73 74 70  Pulse  59 69 62  SpO2- 100%, room air Temperature- 97.9 F  Physical Exam General appearance - alert, well appearing, and in no distress and oriented to person, place, and time Neck - supple, no significant adenopathy Lymphatics - no palpable lymphadenopathy, no hepatosplenomegaly Chest - clear to auscultation, no wheezes, rales or rhonchi, symmetric air entry, no tachypnea, retractions or cyanosis Heart - normal rate, regular rhythm, normal S1, S2, no murmurs, rubs, clicks or gallops Abdomen - soft, nontender, nondistended, no masses or organomegaly Breasts - patient declines to have breast exam Back exam - full range of motion, no tenderness, palpable spasm or pain on motion Neurological - alert, oriented, normal speech, no focal findings or movement disorder noted, neck supple without rigidity, cranial nerves II through XII intact, DTR's normal and symmetric, motor and sensory grossly normal bilaterally, normal muscle tone, no tremors, strength 5/5, Romberg sign negative, normal gait and station  ASSESSMENT AND PLAN: 1. Breast  pain, right: -Patient seen today in office for right breast pain after 3 recent visits to emergency departments. -Considering patients prior history of abnormal mammogram from 2017 in addition to the recommendation for annual mammograms for that reason a mammogram has been ordered for today's visit. Patient agreeable. -Patient requests to have mammogram at [place] will need to be faxed to this office. I have asked the CMA to fax order over. -While in the room with the patient she called [facility] for fax number. She was able to speak with a representative Bonnita Nasuti who provided fax number [phone].  - MM DIGITAL SCREENING BILATERAL; Future  Patient was given the opportunity to ask questions.  Patient verbalized understanding of the plan and was able to repeat key elements of the plan. Patient was given clear instructions to go to Emergency Department or return to medical center if symptoms don't improve, worsen, or new problems develop.The patient verbalized understanding.  Requested Prescriptions    No prescriptions requested or ordered in this encounter    Jaeveon Ashland Zachery Dauer, NP

## 2019-12-08 ENCOUNTER — Telehealth: Payer: Self-pay | Admitting: Family Medicine

## 2019-12-08 NOTE — Telephone Encounter (Signed)
Patient called in and requested for the order to be sent to the breast center. Patient stated that she spoke with them today and they have not receive the order for the patient to get a mammogram. Please follow up at your earliest convenience.

## 2019-12-08 NOTE — Telephone Encounter (Signed)
Patient VM is full.

## 2019-12-08 NOTE — Telephone Encounter (Signed)
Message sent to provider to change orders

## 2019-12-14 ENCOUNTER — Ambulatory Visit: Payer: Self-pay | Admitting: Family Medicine

## 2019-12-19 ENCOUNTER — Encounter: Admit: 2019-12-19 | Discharge: 2019-12-20 | Payer: MEDICAID

## 2020-01-03 ENCOUNTER — Ambulatory Visit: Payer: BLUE CROSS/BLUE SHIELD | Admitting: Family Medicine

## 2020-01-16 ENCOUNTER — Encounter: Admit: 2020-01-16 | Discharge: 2020-01-17 | Payer: MEDICAID | Attending: Internal Medicine | Primary: Internal Medicine

## 2020-01-16 DIAGNOSIS — K7460 Unspecified cirrhosis of liver: Principal | ICD-10-CM

## 2020-01-16 DIAGNOSIS — R188 Other ascites: Secondary | ICD-10-CM

## 2020-01-16 MED ORDER — CIPROFLOXACIN 250 MG TABLET
ORAL_TABLET | Freq: Every day | ORAL | 11 refills | 30.00000 days | Status: CP
Start: 2020-01-16 — End: ?

## 2020-04-24 ENCOUNTER — Other Ambulatory Visit: Payer: Self-pay

## 2020-04-24 ENCOUNTER — Encounter (INDEPENDENT_AMBULATORY_CARE_PROVIDER_SITE_OTHER): Payer: Self-pay | Admitting: Primary Care

## 2020-04-24 ENCOUNTER — Ambulatory Visit (INDEPENDENT_AMBULATORY_CARE_PROVIDER_SITE_OTHER): Payer: Self-pay | Admitting: Primary Care

## 2020-04-24 VITALS — BP 115/65 | HR 100 | Temp 98.4°F | Resp 16 | Ht 62.0 in | Wt 156.0 lb

## 2020-04-24 DIAGNOSIS — R188 Other ascites: Secondary | ICD-10-CM

## 2020-04-24 DIAGNOSIS — R1033 Periumbilical pain: Secondary | ICD-10-CM

## 2020-04-24 DIAGNOSIS — K7460 Unspecified cirrhosis of liver: Secondary | ICD-10-CM

## 2020-04-24 DIAGNOSIS — D649 Anemia, unspecified: Secondary | ICD-10-CM

## 2020-04-24 NOTE — Progress Notes (Signed)
Unbiblical pain and hardening X 3  months

## 2020-04-24 NOTE — Patient Instructions (Addendum)
scheduled appointment with Dr. Jillyn Hidden 04/26/2020 at 11:10 follow up Ascitis Ascites  La ascitis es la acumulacin de demasiado lquido en el abdomen. La ascitis puede variar de leve a grave. Si no se trata, la ascitis puede agravarse. Cules son las causas? Esta afeccin puede ser causada por lo siguiente:  Una enfermedad del hgado llamada cirrosis. Esta es la causa ms frecuente de ascitis.  Hepatitis alcohlica o prolongada (crnica).  Infeccin o inflamacin en el abdomen.  Cncer en el abdomen.  Insuficiencia cardaca.  Enfermedad renal.  Inflamacin del pncreas.  Cogulos en las venas del hgado. Cules son los signos o los sntomas? Los sntomas de esta afeccin incluyen:  Sensacin de que el abdomen est lleno. Esto es frecuente.  Aumento del tamao del abdomen o la cintura.  Hinchazn en las piernas.  Hinchazn del escroto (en los hombres).  Dificultad para respirar.  Dolor en el abdomen.  Aumento repentino de [place]. Si la afeccin es leve, es posible que no tenga sntomas. Cmo se diagnostica? Esta afeccin se diagnostica en funcin de sus antecedentes mdicos y de un examen fsico. El mdico puede solicitarle estudios de diagnstico por imgenes, como una ecografa o una exploracin por tomografa computarizada (TC) del abdomen. Cmo se trata? El tratamiento de esta afeccin depende de la causa de la ascitis. Puede incluir lo siguiente:  Tomar un medicamento que lo Adult nurse. Se denomina diurtico.  Reducir estrictamente el consumo de sal (sodio). La sal puede causar la acumulacin (retencin) de lquido adicional en el cuerpo, lo que empeora la ascitis.  Someterse a un procedimiento para extraer el lquido del abdomen (paracentesis).  Someterse a un procedimiento que [employer] de las principales venas dentro del hgado y [place] la presin sobre este rgano. Este procedimiento se llama derivacin portosistmica intraheptica transyugular  (DPIT).  Colocacin de un catter de drenaje (derivacin peritoneovenosa) para manejar el lquido adicional en el abdomen. La ascitis puede desaparecer o mejorar cuando se trata la afeccin que la caus. Siga estas instrucciones en su casa:  Lleve un registro de [employer]. Para hacerlo, psese a la [employer] y anote [address].  Lleve un registro de la cantidad de lquido que bebe y de los cambios en la cantidad que orina o la frecuencia con la que [place].  Siga las indicaciones del mdico respecto de la cantidad de lquido que puede beber.  Intente no comer comidas saladas (con alto contenido de sodio).  Tome los medicamentos de venta libre y los recetados solamente como se lo haya indicado el mdico.  Oceanographer a todas las visitas de seguimiento como se lo haya indicado el mdico. Esto es importante.  Informe al mdico si hay cambios en su salud, especialmente si tiene sntomas nuevos o los sntomas empeoran. Comunquese con un mdico si:  Aumenta ms de 3lb (1,36kg) en 3das.  Aumenta el tamao de su cintura.  Tiene un nuevo episodio de hinchazn en las piernas.  La hinchazn de las piernas empeora. Solicite ayuda inmediatamente si:  Tiene fiebre.  Se siente confundido.  Tiene dificultad para respirar que es nueva o ms intensa.  Tiene un dolor nuevo o que empeora en el abdomen.  Tiene un episodio de hinchazn del escroto nuevo o que empeora (en los hombres). Resumen  La ascitis es la acumulacin de demasiado lquido en el abdomen.  La ascitis la pueden provocar diversas afecciones, tales como cirrosis, hepatitis, cncer o insuficiencia cardaca congestiva.  Entre los sntomas, puede incluirse hinchazn del  abdomen y de otras zonas del cuerpo debido lquido adicional.  El tratamiento puede incluir cambios en la alimentacin, medicamentos o procedimientos. Esta informacin no tiene Theme park manager el consejo del mdico. Asegrese de hacerle al mdico  cualquier pregunta que tenga. Document Revised: 08/29/2018 Document Reviewed: 08/29/2018 Elsevier Patient Education  2020 [employer].

## 2020-04-24 NOTE — Progress Notes (Signed)
Acute Office Visit  Subjective:    Patient ID: Yolanda Guzman, female    DOB: Sep 28, 1975, 44 y.o.   MRN: [id number]   chief complaint on file. Abdominal pain around umbilical area   HPI Ms. Yolanda Guzman is a 44 year old Hispanic femal that understands and speaks English well.presents today for umbilical pain. Increase pain when something touches the area. 6/10 for pain level.Pain has been present for the last 3 months. She has cirrhosis of the liver. Abdomen is enlarge but not as large as it has been Awaiting a transplant for a liver.   Past Medical History:  Diagnosis Date  . Hepatic cirrhosis (HCC)     Past Surgical History:  Procedure Laterality Date  . CESAREAN SECTION    . LIVER BIOPSY  03-2015    Family History  Problem Relation Age of Onset  . Hypertension Mother   . Diabetes Father     Social History   Socioeconomic History  . Marital status: Married    Spouse name: Not on file  . Number of children: 2  . Years of education: Not on file  . Highest education level: Not on file  Occupational History  . Not on file  Tobacco Use  . Smoking status: Never Smoker  . Smokeless tobacco: Never Used  Substance and Sexual Activity  . Alcohol use: No    Alcohol/week: 0.0 standard drinks  . Drug use: No  . Sexual activity: Yes  Other Topics Concern  . Not on file  Social History Narrative  . Not on file   Social Determinants of Health   Financial Resource Strain:   . Difficulty of Paying Living Expenses: Not on file  Food Insecurity:   . Worried About Programme researcher, broadcasting/film/video in the Last Year: Not on file  . Ran Out of Food in the Last Year: Not on file  Transportation Needs:   . Lack of Transportation (Medical): Not on file  . Lack of Transportation (Non-Medical): Not on file  Physical Activity:   . Days of Exercise per Week: Not on file  . Minutes of Exercise per Session: Not on file  Stress:   . Feeling of Stress : Not on file  Social Connections:   .  Frequency of Communication with Friends and Family: Not on file  . Frequency of Social Gatherings with Friends and Family: Not on file  . Attends Religious Services: Not on file  . Active Member of Clubs or Organizations: Not on file  . Attends Banker Meetings: Not on file  . Marital Status: Not on file  Intimate Partner Violence:   . Fear of Current or Ex-Partner: Not on file  . Emotionally Abused: Not on file  . Physically Abused: Not on file  . Sexually Abused: Not on file    Outpatient Medications Prior to Visit  Medication Sig Dispense Refill  . ciprofloxacin (CIPRO) 500 MG tablet Take 500 mg by mouth daily.    . furosemide (LASIX) 40 MG tablet Take 1 tablet (40 mg total) by mouth daily. 30 tablet 5  . ibuprofen (ADVIL,MOTRIN) 600 MG tablet Take 1 tablet (600 mg total) by mouth every 8 (eight) hours as needed for moderate pain (Take with food.). (Patient not taking: Reported on 11/30/2019) 30 tablet 0  . omeprazole (PRILOSEC) 40 MG capsule Take 1 capsule (40 mg total) by mouth daily. 90 capsule 3  . propranolol (INDERAL) 20 MG tablet Take 1 tablet (20 mg total) by  mouth 2 (two) times daily. 60 tablet 5  . spironolactone (ALDACTONE) 25 MG tablet Take 1 tablet (25 mg total) by mouth daily. 30 tablet 5  . traZODone (DESYREL) 50 MG tablet Take 0.5-1 tablets (25-50 mg total) by mouth at bedtime as needed for sleep. (Patient not taking: Reported on 11/30/2019) 30 tablet 3   No facility-administered medications prior to visit.    Allergies  Allergen Reactions  . Penicillins     Review of Systems  Gastrointestinal: Positive for abdominal distention and abdominal pain.  Genitourinary:       Umbilical pain   All other systems reviewed and are negative.      Objective:    Physical Exam Vitals reviewed.  Constitutional:      Appearance: Normal appearance.  HENT:     Head: Normocephalic.     Right Ear: Tympanic membrane normal.     Left Ear: Tympanic membrane normal.   Eyes:     Extraocular Movements: Extraocular movements intact.     Pupils: Pupils are equal, round, and reactive to light.  Cardiovascular:     Rate and Rhythm: Normal rate and regular rhythm.     Pulses: Normal pulses.     Heart sounds: Normal heart sounds.  Pulmonary:     Effort: Pulmonary effort is normal.     Breath sounds: Normal breath sounds.  Abdominal:     General: Bowel sounds are normal. There is distension.  Musculoskeletal:        General: Normal range of motion.     Cervical back: Normal range of motion.  Skin:    General: Skin is warm and dry.  Neurological:     Mental Status: She is alert and oriented to person, place, and time.  Psychiatric:        Mood and Affect: Mood normal.        Behavior: Behavior normal.        Thought Content: Thought content normal.        Judgment: Judgment normal.     BP 115/65   Pulse 100   Temp 98.4 F (36.9 C)   Resp 16   Ht 5\' 2"  (1.575 m)   Wt 156 lb (70.8 kg)   SpO2 100%   BMI 28.53 kg/m  Wt Readings from Last 3 Encounters:  04/24/20 156 lb (70.8 kg)  11/30/19 185 lb 3.2 oz (84 kg)  06/27/18 167 lb (75.8 kg)    Health Maintenance Due  Topic Date Due  . COVID-19 Vaccine (1) Never done  . PAP SMEAR-Modifier  03/25/2019  . INFLUENZA VACCINE  03/24/2020    There are no preventive care reminders to display for this patient.   Lab Results  Component Value Date   TSH 1.821 12/03/2014   Lab Results  Component Value Date   WBC 1.3 (LL) 06/02/2017   HGB 11.0 (L) 06/02/2017   HCT 33.9 (L) 06/02/2017   MCV 90 06/02/2017   PLT 27 (LL) 06/02/2017   Lab Results  Component Value Date   NA 138 06/27/2018   K 3.2 (L) 06/27/2018   CO2 22 06/27/2018   GLUCOSE 86 06/27/2018   BUN 6 06/27/2018   CREATININE 0.41 (L) 06/27/2018   BILITOT 1.2 06/02/2017   ALKPHOS 110 06/02/2017   AST 43 (H) 06/02/2017   ALT 24 06/02/2017   PROT 5.6 (L) 06/02/2017   ALBUMIN 3.0 (L) 06/02/2017   CALCIUM 7.1 (L) 06/27/2018    ANIONGAP 8 10/26/2014   GFR 130.57  10/08/2015   No results found for: CHOL No results found for: HDL No results found for: LDLCALC No results found for: TRIG No results found for: CHOLHDL Lab Results  Component Value Date   HGBA1C 5.0 03/16/2016       Assessment & Plan:  Diagnoses and all orders for this visit:   Diagnoses and all orders for this visit:  Cirrhosis of liver with ascites, unspecified hepatic cirrhosis type [facility]) She history of autoimmune hepatitis for which she is followed at [facility] awaiting a liver transplant.   Periumbilical abdominal pain ABDOMINAL PAIN  Location: umbilical area Onset: gradual Radiation: none  Severity: mild-moderate pain Quality: tenderness  Duration: 3 months  Better with: nothing touching area (ie jeans button) Worse with: unknown   Symptoms Nausea/Vomiting: no  Diarrhea: no  Constipation: no  Melena/BRBPR: no  Hematemesis: no  Anorexia: no  Fever/Chills: no  Dysuria: no  Rash: no Wt loss: no  EtOH use: no  NSAIDs/ASA: no   Past Surgeries:  Cesarean section  Liver biospy  Check CMP refer to PCP and f/u with GI  Anemia, unspecified type CBC Latest Ref Rng & Units 06/02/2017 09/08/2016 03/16/2016  WBC 3.4 - 10.8 x10E3/uL 1.3(LL) 1.5(L) 1.6(L)  Hemoglobin 11.1 - 15.9 g/dL 11.0(L) 11.2(L) 11.4(L)  Hematocrit 34.0 - 46.6 % 33.9(L) 33.9(L) 33.6(L)  Platelets 150 - 379 x10E3/uL 27(LL) 56(L) 43(L)      No orders of the defined types were placed in this encounter.    Grayce Sessions, NP

## 2020-04-25 LAB — CBC WITH DIFFERENTIAL/PLATELET
Basophils Absolute: 0 10*3/uL (ref 0.0–0.2)
Basos: 1 %
EOS (ABSOLUTE): 0.1 10*3/uL (ref 0.0–0.4)
Eos: 6 %
Hematocrit: 34.1 % (ref 34.0–46.6)
Hemoglobin: 11.4 g/dL (ref 11.1–15.9)
Immature Grans (Abs): 0 10*3/uL (ref 0.0–0.1)
Immature Granulocytes: 1 %
Lymphocytes Absolute: 0.8 10*3/uL (ref 0.7–3.1)
Lymphs: 39 %
MCH: 30.7 pg (ref 26.6–33.0)
MCHC: 33.4 g/dL (ref 31.5–35.7)
MCV: 92 fL (ref 79–97)
Monocytes Absolute: 0.2 10*3/uL (ref 0.1–0.9)
Monocytes: 11 %
Neutrophils Absolute: 0.9 10*3/uL — ABNORMAL LOW (ref 1.4–7.0)
Neutrophils: 42 %
Platelets: 52 10*3/uL — CL (ref 150–450)
RBC: 3.71 x10E6/uL — ABNORMAL LOW (ref 3.77–5.28)
RDW: 14.9 % (ref 11.7–15.4)
WBC: 2 10*3/uL — CL (ref 3.4–10.8)

## 2020-04-25 LAB — CMP14+EGFR
ALT: 31 IU/L (ref 0–32)
AST: 49 IU/L — ABNORMAL HIGH (ref 0–40)
Albumin/Globulin Ratio: 0.9 — ABNORMAL LOW (ref 1.2–2.2)
Albumin: 2.5 g/dL — ABNORMAL LOW (ref 3.8–4.8)
Alkaline Phosphatase: 177 IU/L — ABNORMAL HIGH (ref 48–121)
BUN/Creatinine Ratio: 13 (ref 9–23)
BUN: 6 mg/dL (ref 6–24)
Bilirubin Total: 1.2 mg/dL (ref 0.0–1.2)
CO2: 25 mmol/L (ref 20–29)
Calcium: 7.5 mg/dL — ABNORMAL LOW (ref 8.7–10.2)
Chloride: 105 mmol/L (ref 96–106)
Creatinine, Ser: 0.47 mg/dL — ABNORMAL LOW (ref 0.57–1.00)
GFR calc Af Amer: 140 mL/min/{1.73_m2} (ref >59)
GFR calc non Af Amer: 121 mL/min/{1.73_m2} (ref >59)
Globulin, Total: 2.7 g/dL (ref 1.5–4.5)
Glucose: 114 mg/dL — ABNORMAL HIGH (ref 65–99)
Potassium: 4 mmol/L (ref 3.5–5.2)
Sodium: 137 mmol/L (ref 134–144)
Total Protein: 5.2 g/dL — ABNORMAL LOW (ref 6.0–8.5)

## 2020-04-25 NOTE — Progress Notes (Signed)
Patient with abnormal CBC with leukopenia/low white blood cell count and thrombocytopenia/low platelet count which is slightly improved.  Patient with known autoimmune hepatitis for which she is awaiting liver transplant.  Because patient was recently seen for abdominal pain, would suggest that patient go to the emergency department if she is having continued or worsening abdominal pain as she does have a history of prior spontaneous bacterial peritonitis.  Normal nonfasting electrolytes on comprehensive metabolic panel, LFTs with mild increase in AST at 49 and alkaline phosphatase of 177.

## 2020-04-26 ENCOUNTER — Ambulatory Visit: Payer: Medicaid Other | Admitting: Family Medicine

## 2020-05-01 ENCOUNTER — Ambulatory Visit: Payer: Medicaid Other | Admitting: Family Medicine

## 2020-05-24 DIAGNOSIS — K754 Autoimmune hepatitis: Principal | ICD-10-CM

## 2020-05-24 DIAGNOSIS — K766 Portal hypertension: Principal | ICD-10-CM

## 2020-05-24 DIAGNOSIS — R188 Other ascites: Principal | ICD-10-CM

## 2020-05-24 DIAGNOSIS — K7460 Unspecified cirrhosis of liver: Principal | ICD-10-CM

## 2020-05-24 DIAGNOSIS — K7210 Chronic hepatic failure without coma: Principal | ICD-10-CM

## 2020-05-24 DIAGNOSIS — I8500 Esophageal varices without bleeding: Principal | ICD-10-CM

## 2020-05-24 MED ORDER — PROPRANOLOL 20 MG TABLET
ORAL_TABLET | Freq: Two times a day (BID) | ORAL | 11 refills | 30.00000 days | Status: CP
Start: 2020-05-24 — End: 2021-05-24

## 2020-05-24 MED ORDER — SPIRONOLACTONE 50 MG TABLET
ORAL_TABLET | Freq: Every day | ORAL | 11 refills | 30.00000 days | Status: CP
Start: 2020-05-24 — End: 2021-05-24

## 2020-05-24 MED ORDER — LACTULOSE 20 GRAM/30 ML ORAL SOLUTION
Freq: Every day | ORAL | 11 refills | 30 days | Status: CP
Start: 2020-05-24 — End: 2021-05-24

## 2020-05-24 MED ORDER — AZATHIOPRINE 50 MG TABLET
ORAL_TABLET | Freq: Every day | ORAL | 11 refills | 30 days | Status: CP
Start: 2020-05-24 — End: 2021-05-24

## 2020-05-24 MED ORDER — FUROSEMIDE 40 MG TABLET
ORAL_TABLET | Freq: Every day | ORAL | 11 refills | 30 days | Status: CP
Start: 2020-05-24 — End: 2021-05-24

## 2020-05-24 MED ORDER — CIPROFLOXACIN 250 MG TABLET
ORAL_TABLET | Freq: Every day | ORAL | 11 refills | 30 days | Status: CP
Start: 2020-05-24 — End: ?

## 2020-06-16 DIAGNOSIS — K7460 Unspecified cirrhosis of liver: Principal | ICD-10-CM

## 2020-06-16 DIAGNOSIS — K754 Autoimmune hepatitis: Principal | ICD-10-CM

## 2020-06-17 MED ORDER — AZATHIOPRINE 50 MG TABLET
ORAL_TABLET | 4 refills | 0 days | Status: CP
Start: 2020-06-17 — End: ?

## 2020-07-02 ENCOUNTER — Encounter: Admit: 2020-07-02 | Discharge: 2020-07-03 | Payer: MEDICAID | Attending: Internal Medicine | Primary: Internal Medicine

## 2020-07-02 DIAGNOSIS — Z792 Long term (current) use of antibiotics: Principal | ICD-10-CM

## 2020-07-02 DIAGNOSIS — Z23 Encounter for immunization: Principal | ICD-10-CM

## 2020-07-02 DIAGNOSIS — K429 Umbilical hernia without obstruction or gangrene: Principal | ICD-10-CM

## 2020-07-02 DIAGNOSIS — K7469 Other cirrhosis of liver: Principal | ICD-10-CM

## 2020-07-02 DIAGNOSIS — R600 Localized edema: Principal | ICD-10-CM

## 2020-07-02 DIAGNOSIS — R1033 Periumbilical pain: Principal | ICD-10-CM

## 2020-07-02 DIAGNOSIS — E8881 Metabolic syndrome: Principal | ICD-10-CM

## 2020-07-02 MED ORDER — BACLOFEN 10 MG TABLET
ORAL_TABLET | Freq: Every day | ORAL | 2 refills | 90 days | Status: CP | PRN
Start: 2020-07-02 — End: 2021-03-29

## 2020-07-11 ENCOUNTER — Encounter: Admit: 2020-07-11 | Discharge: 2020-07-12 | Payer: MEDICAID

## 2020-07-11 DIAGNOSIS — K7469 Other cirrhosis of liver: Principal | ICD-10-CM

## 2020-07-31 DIAGNOSIS — R188 Other ascites: Principal | ICD-10-CM

## 2020-07-31 DIAGNOSIS — K7460 Unspecified cirrhosis of liver: Principal | ICD-10-CM

## 2020-11-20 ENCOUNTER — Encounter: Admit: 2020-11-20 | Discharge: 2020-11-21 | Payer: MEDICAID

## 2020-12-24 ENCOUNTER — Ambulatory Visit: Admit: 2020-12-24 | Discharge: 2020-12-25 | Payer: MEDICAID | Attending: Internal Medicine | Primary: Internal Medicine

## 2020-12-24 DIAGNOSIS — K754 Autoimmune hepatitis: Principal | ICD-10-CM

## 2020-12-24 MED ORDER — BACLOFEN 10 MG TABLET
ORAL_TABLET | Freq: Every day | ORAL | 2 refills | 90.00000 days | Status: CP | PRN
Start: 2020-12-24 — End: 2021-09-20

## 2021-02-10 ENCOUNTER — Other Ambulatory Visit: Payer: Self-pay | Admitting: Nurse Practitioner

## 2021-02-10 ENCOUNTER — Other Ambulatory Visit (HOSPITAL_COMMUNITY): Payer: Self-pay | Admitting: Nurse Practitioner

## 2021-02-10 ENCOUNTER — Ambulatory Visit
Admit: 2021-02-10 | Discharge: 2021-02-11 | Disposition: A | Discharge status: Home / Self Care | Payer: PRIVATE HEALTH INSURANCE | Attending: Emergency Medicine

## 2021-02-10 DIAGNOSIS — K7469 Other cirrhosis of liver: Principal | ICD-10-CM

## 2021-02-10 DIAGNOSIS — R188 Other ascites: Secondary | ICD-10-CM

## 2021-02-11 MED ORDER — KETOROLAC 10 MG TABLET
ORAL_TABLET | Freq: Four times a day (QID) | ORAL | 0 refills | 3.00000 days | Status: CP | PRN
Start: 2021-02-11 — End: 2021-02-16

## 2021-02-12 ENCOUNTER — Other Ambulatory Visit: Payer: Self-pay

## 2021-02-12 ENCOUNTER — Ambulatory Visit (HOSPITAL_COMMUNITY)
Admission: RE | Admit: 2021-02-12 | Discharge: 2021-02-12 | Disposition: A | Discharge status: Home / Self Care | Payer: Commercial Managed Care - PPO | Source: Ambulatory Visit | Attending: Nurse Practitioner | Admitting: Nurse Practitioner

## 2021-02-12 DIAGNOSIS — R188 Other ascites: Secondary | ICD-10-CM | POA: Diagnosis not present

## 2021-02-12 HISTORY — PX: IR PARACENTESIS: IMG2679

## 2021-02-12 MED ORDER — LIDOCAINE HCL 1 % IJ SOLN
INTRAMUSCULAR | Status: DC | PRN
  Administered 2021-02-12: 10 mL

## 2021-02-12 MED ORDER — ALBUMIN HUMAN 25 % IV SOLN
25.0000 g | Freq: Once | INTRAVENOUS | Status: AC
  Filled 2021-02-12: qty 100

## 2021-02-12 MED ORDER — LIDOCAINE HCL 1 % IJ SOLN
INTRAMUSCULAR | Status: AC
  Filled 2021-02-12: qty 20

## 2021-02-12 MED ORDER — ALBUMIN HUMAN 25 % IV SOLN
INTRAVENOUS | Status: AC
  Administered 2021-02-12: 25 g via INTRAVENOUS
  Filled 2021-02-12: qty 100

## 2021-02-12 NOTE — Procedures (Signed)
PROCEDURE SUMMARY:  Successful US guided paracentesis from left lateral abdomen.  Yielded 5.2 liters of yellow fluid.  No immediate complications.  Pt tolerated well.   Specimen was not sent for labs.  EBL < 39mL  Hoyt Koch PA-C 02/12/2021 10:42 AM

## 2021-03-11 ENCOUNTER — Encounter: Admit: 2021-03-11 | Discharge: 2021-03-12 | Payer: MEDICAID

## 2021-03-11 DIAGNOSIS — K7460 Unspecified cirrhosis of liver: Principal | ICD-10-CM

## 2021-03-11 DIAGNOSIS — K754 Autoimmune hepatitis: Principal | ICD-10-CM

## 2021-03-11 DIAGNOSIS — R188 Other ascites: Principal | ICD-10-CM

## 2021-03-11 DIAGNOSIS — K7210 Chronic hepatic failure without coma: Principal | ICD-10-CM

## 2021-03-11 DIAGNOSIS — K766 Portal hypertension: Principal | ICD-10-CM

## 2021-03-11 DIAGNOSIS — I8500 Esophageal varices without bleeding: Principal | ICD-10-CM

## 2021-03-11 MED ORDER — BACLOFEN 10 MG TABLET
ORAL_TABLET | Freq: Every day | ORAL | 2 refills | 90 days | Status: CP | PRN
Start: 2021-03-11 — End: 2021-12-06

## 2021-03-11 MED ORDER — LACTULOSE 20 GRAM/30 ML ORAL SOLUTION
Freq: Every day | ORAL | 4 refills | 90 days | Status: CP
Start: 2021-03-11 — End: ?

## 2021-03-11 MED ORDER — AZATHIOPRINE 50 MG TABLET
ORAL_TABLET | Freq: Every day | ORAL | 4 refills | 90 days | Status: CP
Start: 2021-03-11 — End: ?

## 2021-03-11 MED ORDER — FUROSEMIDE 40 MG TABLET
ORAL_TABLET | Freq: Every day | ORAL | 3 refills | 90 days | Status: CP
Start: 2021-03-11 — End: ?

## 2021-03-11 MED ORDER — CIPROFLOXACIN 250 MG TABLET
ORAL_TABLET | Freq: Every day | ORAL | 4 refills | 90 days | Status: CP
Start: 2021-03-11 — End: ?

## 2021-03-11 MED ORDER — PROPRANOLOL 20 MG TABLET
ORAL_TABLET | Freq: Two times a day (BID) | ORAL | 3 refills | 90 days | Status: CP
Start: 2021-03-11 — End: ?

## 2021-03-11 MED ORDER — SPIRONOLACTONE 50 MG TABLET
ORAL_TABLET | Freq: Every day | ORAL | 4 refills | 90 days | Status: CP
Start: 2021-03-11 — End: ?

## 2021-03-13 DIAGNOSIS — K7210 Chronic hepatic failure without coma: Principal | ICD-10-CM

## 2021-03-19 ENCOUNTER — Encounter: Admit: 2021-03-19 | Discharge: 2021-03-20 | Payer: MEDICAID

## 2021-03-19 MED ORDER — CLOBETASOL 0.05 % TOPICAL CREAM
Freq: Two times a day (BID) | TOPICAL | 3 refills | 0 days | Status: CP
Start: 2021-03-19 — End: ?

## 2021-03-19 MED ORDER — FUROSEMIDE 40 MG TABLET
ORAL_TABLET | Freq: Every day | ORAL | 3 refills | 30 days | Status: CP
Start: 2021-03-19 — End: 2021-04-18

## 2021-04-04 ENCOUNTER — Ambulatory Visit: Admit: 2021-04-04 | Discharge: 2021-04-05

## 2021-04-04 DIAGNOSIS — K7460 Unspecified cirrhosis of liver: Principal | ICD-10-CM

## 2021-04-04 DIAGNOSIS — R188 Other ascites: Principal | ICD-10-CM

## 2021-04-22 ENCOUNTER — Ambulatory Visit: Admit: 2021-04-22 | Discharge: 2021-04-23 | Payer: MEDICAID

## 2021-05-13 ENCOUNTER — Ambulatory Visit: Admit: 2021-05-13 | Discharge: 2021-05-14 | Payer: MEDICAID

## 2021-06-25 ENCOUNTER — Ambulatory Visit: Admit: 2021-06-25 | Discharge: 2021-06-26 | Payer: MEDICAID

## 2021-06-25 DIAGNOSIS — K7460 Unspecified cirrhosis of liver: Principal | ICD-10-CM

## 2021-10-28 DIAGNOSIS — K7460 Unspecified cirrhosis of liver: Principal | ICD-10-CM

## 2021-10-28 DIAGNOSIS — R188 Other ascites: Principal | ICD-10-CM

## 2021-11-05 MED ORDER — LACTULOSE 10 GRAM/15 ML ORAL SOLUTION
5 refills | 0 days | Status: CP
Start: 2021-11-05 — End: ?

## 2021-11-07 ENCOUNTER — Ambulatory Visit: Admit: 2021-11-07 | Discharge: 2021-11-07

## 2021-11-07 DIAGNOSIS — K7682 Hepatic encephalopathy: Principal | ICD-10-CM

## 2021-11-07 DIAGNOSIS — K7460 Unspecified cirrhosis of liver: Principal | ICD-10-CM

## 2021-11-07 DIAGNOSIS — K754 Autoimmune hepatitis: Principal | ICD-10-CM

## 2021-11-07 DIAGNOSIS — R748 Abnormal levels of other serum enzymes: Principal | ICD-10-CM

## 2021-11-07 DIAGNOSIS — R188 Other ascites: Principal | ICD-10-CM

## 2021-11-07 MED ORDER — XIFAXAN 550 MG TABLET
ORAL_TABLET | Freq: Two times a day (BID) | ORAL | 4 refills | 90 days | Status: CP
Start: 2021-11-07 — End: ?

## 2021-11-07 MED ORDER — LACTULOSE 10 GRAM/15 ML ORAL SOLUTION
Freq: Two times a day (BID) | ORAL | 11 refills | 30 days | Status: CP
Start: 2021-11-07 — End: ?

## 2021-11-10 ENCOUNTER — Encounter: Payer: Self-pay | Admitting: Internal Medicine

## 2021-11-10 ENCOUNTER — Ambulatory Visit: Payer: Self-pay | Attending: Internal Medicine | Admitting: Internal Medicine

## 2021-11-10 ENCOUNTER — Other Ambulatory Visit: Payer: Self-pay

## 2021-11-10 ENCOUNTER — Other Ambulatory Visit (HOSPITAL_COMMUNITY)
Admission: RE | Admit: 2021-11-10 | Discharge: 2021-11-10 | Disposition: A | Discharge status: Home / Self Care | Payer: Medicaid Other | Source: Ambulatory Visit | Attending: Internal Medicine | Admitting: Internal Medicine

## 2021-11-10 VITALS — BP 111/62 | HR 72 | Resp 16 | Ht 62.0 in | Wt 177.2 lb

## 2021-11-10 DIAGNOSIS — R058 Other specified cough: Secondary | ICD-10-CM

## 2021-11-10 DIAGNOSIS — Z124 Encounter for screening for malignant neoplasm of cervix: Secondary | ICD-10-CM | POA: Insufficient documentation

## 2021-11-10 DIAGNOSIS — Z1151 Encounter for screening for human papillomavirus (HPV): Secondary | ICD-10-CM | POA: Insufficient documentation

## 2021-11-10 MED ORDER — LORATADINE 10 MG PO TABS
ORAL_TABLET | ORAL | 0 refills | Status: DC
  Filled 2021-11-10: qty 15, 52d supply, fill #0

## 2021-11-10 NOTE — Progress Notes (Signed)
? ? ?Patient ID: Yolanda Guzman, female    DOB: 04/17/1976  MRN: [id number] ? ?CC: Gynecologic Exam (Pt is requesting pap only ) and Cough ? ? ?Subjective: ?Yolanda Guzman is a 46 y.o. female who presents for pap ?Her concerns today include:  ?Pt with hx of cirrhosis, autoimmune hepatitis ? ?GYN History:  ?Pt is GP ?Any hx of abn paps?:no ?Menses regular or irregular?:  regular ?How long does menses last? 1 wk ?Menstrual flow light or heavy?: light ?Method of birth control?:  condoms ?Any vaginal dischg at this time?: no ?Dysuria?: no ?Any hx of STI?:  ?Sexually active with how many partners: one female ?Desires STI screen: no ?Last MMG: 2017 ?Family hx of uterine, cervical or breast cancer?:  no. ? ?C/o dry cough x 1 wk ?Some itchy throat with the cough. ?Denies any shortness of breath or fever.  No drainage at the back of the throat.  No acid reflux symptoms. ? ?Patient Active Problem List  ? Diagnosis Date Noted  ? Autoimmune hepatitis ([place]) 03/24/2016  ? Vitamin D deficiency 03/16/2016  ? RUQ abdominal pain 11/02/2014  ? Cirrhosis ([place]) 11/02/2014  ?  ? ?Current Outpatient Medications on File Prior to Visit  ?Medication Sig Dispense Refill  ? furosemide (LASIX) 40 MG tablet Take 1 tablet (40 mg total) by mouth daily. 30 tablet 5  ? propranolol (INDERAL) 20 MG tablet Take 1 tablet (20 mg total) by mouth 2 (two) times daily. 60 tablet 5  ? spironolactone (ALDACTONE) 25 MG tablet Take 1 tablet (25 mg total) by mouth daily. 30 tablet 5  ? omeprazole (PRILOSEC) 40 MG capsule Take 1 capsule (40 mg total) by mouth daily. (Patient not taking: Reported on 11/10/2021) 90 capsule 3  ? traZODone (DESYREL) 50 MG tablet Take 0.5-1 tablets (25-50 mg total) by mouth at bedtime as needed for sleep. (Patient not taking: Reported on 11/30/2019) 30 tablet 3  ? ?No current facility-administered medications on file prior to visit.  ? ? ?Allergies  ?Allergen Reactions  ? Penicillins   ? ? ?Social History  ? ?Socioeconomic History  ? Marital  status: Married  ?  Spouse name: Not on file  ? Number of children: 2  ? Years of education: Not on file  ? Highest education level: Not on file  ?Occupational History  ? Not on file  ?Tobacco Use  ? Smoking status: Never  ? Smokeless tobacco: Never  ?Substance and Sexual Activity  ? Alcohol use: No  ?  Alcohol/week: 0.0 standard drinks  ? Drug use: No  ? Sexual activity: Yes  ?Other Topics Concern  ? Not on file  ?Social History Narrative  ? Not on file  ? ?Social Determinants of Health  ? ?Financial Resource Strain: Not on file  ?Food Insecurity: Not on file  ?Transportation Needs: Not on file  ?Physical Activity: Not on file  ?Stress: Not on file  ?Social Connections: Not on file  ?Intimate Partner Violence: Not on file  ? ? ?Family History  ?Problem Relation Age of Onset  ? Hypertension Mother   ? Diabetes Father   ? ? ?Past Surgical History:  ?Procedure Laterality Date  ? CESAREAN SECTION    ? IR PARACENTESIS  02/12/2021  ? LIVER BIOPSY  03-2015  ? ? ?ROS: ?Review of Systems ?Negative except as stated above ? ?PHYSICAL EXAM: ?BP 111/62   Pulse 72   Resp 16   Ht 5\' 2"  (1.575 m)   Wt 177 lb 3.2 oz (80.4  kg)   SpO2 99%   BMI 32.41 kg/m?   ?Physical Exam ? ?General appearance - alert, well appearing, and in no distress ?Mental status - normal mood, behavior, speech, dress, motor activity, and thought processes ?Breasts -CMA Sallyanne Havers present for breast and pelvic exam: Breasts appear normal, no suspicious masses, no skin or nipple changes or axillary nodes ?Pelvic - normal external genitalia, vulva, vagina, cervix, uterus and adnexa ? ? ?CMP Latest Ref Rng & Units 04/24/2020 06/27/2018 06/02/2017  ?Glucose 65 - 99 mg/dL 114(H) 86 197(H)  ?BUN 6 - 24 mg/dL 6 6 3(L)  ?Creatinine 0.57 - 1.00 mg/dL 0.47(L) 0.41(L) 0.53(L)  ?Sodium 134 - 144 mmol/L 137 138 139  ?Potassium 3.5 - 5.2 mmol/L 4.0 3.2(L) 4.1  ?Chloride 96 - 106 mmol/L 105 106 106  ?CO2 20 - 29 mmol/L 25 22 22   ?Calcium 8.7 - 10.2 mg/dL 7.5(L) 7.1(L) 7.8(L)   ?Total Protein 6.0 - 8.5 g/dL 5.2(L) - 5.6(L)  ?Total Bilirubin 0.0 - 1.2 mg/dL 1.2 - 1.2  ?Alkaline Phos 48 - 121 IU/L 177(H) - 110  ?AST 0 - 40 IU/L 49(H) - 43(H)  ?ALT 0 - 32 IU/L 31 - 24  ? ?Lipid Panel  ?No results found for: CHOL, TRIG, HDL, CHOLHDL, VLDL, LDLCALC, LDLDIRECT ? ?CBC ?   ?Component Value Date/Time  ? WBC 2.0 (LL) 04/24/2020 1437  ? WBC 1.5 (L) 09/08/2016 1155  ? RBC 3.71 (L) 04/24/2020 1437  ? RBC 3.60 (L) 09/08/2016 1155  ? HGB 11.4 04/24/2020 1437  ? HCT 34.1 04/24/2020 1437  ? PLT 52 (LL) 04/24/2020 1437  ? MCV 92 04/24/2020 1437  ? MCH 30.7 04/24/2020 1437  ? MCH 31.1 09/08/2016 1155  ? MCHC 33.4 04/24/2020 1437  ? MCHC 33.0 09/08/2016 1155  ? RDW 14.9 04/24/2020 1437  ? LYMPHSABS 0.8 04/24/2020 1437  ? MONOABS 105 (L) 09/08/2016 1155  ? EOSABS 0.1 04/24/2020 1437  ? BASOSABS 0.0 04/24/2020 1437  ? ? ?ASSESSMENT AND PLAN: ? ?1. Pap smear for cervical cancer screening ?- Cytology - PAP ? ?2. Dry cough ?Likely related to allergies.  Recommend taking Claritin as needed.  Given history of cirrhosis, we will have her take the Claritin only twice a week as needed. ? ? ? ?Patient was given the opportunity to ask questions.  Patient verbalized understanding of the plan and was able to repeat key elements of the plan.  ? ?This documentation was completed using Radio producer.  Any transcriptional errors are unintentional. ? ?No orders of the defined types were placed in this encounter. ? ? ? ?Requested Prescriptions  ? ? No prescriptions requested or ordered in this encounter  ? ? ?No follow-ups on file. ? ?Karle Plumber, MD, FACP ?

## 2021-11-11 ENCOUNTER — Other Ambulatory Visit: Payer: Self-pay

## 2021-11-12 LAB — CYTOLOGY - PAP
Adequacy: ABSENT
Comment: NEGATIVE
Diagnosis: NEGATIVE
High risk HPV: NEGATIVE

## 2021-11-18 ENCOUNTER — Other Ambulatory Visit: Payer: Self-pay

## 2021-12-18 NOTE — Unmapped (Signed)
[facility] Specialty Medication Onboarding    Specialty Medication: XIFAXAN 550 mg Tab (rifAXIMin)  Prior Authorization: Not Required   Financial Assistance: No - MAPs has reached maximum number of attempts to obtain necessary information from the patient in regards to the financial assistance process  Final Copay/Day Supply: $12,448.18 (cash price) / 90    Insurance Restrictions: None     Notes to Pharmacist:     The triage team has completed the benefits investigation and has determined that the patient is able to fill this medication at [facility]. Please contact the patient to complete the onboarding or follow up with the prescribing physician as needed.

## 2021-12-19 NOTE — Unmapped (Signed)
Specialty Medication(s): Xifxan 550mg     Alicia Armstrong has been dis-enrolled from the [facility] specialty pharmacy services due to multiple unsuccessful outreach attempts by the MAPS technician to obtain necessary information and documents to submit an application for manufacturer assistance.    Additional information provided to the patient: n/a    Roderic Palau  [facility] Specialty Pharmacist

## 2022-02-17 ENCOUNTER — Ambulatory Visit: Admit: 2022-02-17 | Discharge: 2022-02-21 | Disposition: A | Discharge status: Home / Self Care | Payer: MEDICAID

## 2022-02-17 ENCOUNTER — Ambulatory Visit: Admit: 2022-02-17 | Discharge: 2022-02-18

## 2022-02-17 ENCOUNTER — Ambulatory Visit: Admit: 2022-02-17 | Discharge: 2022-02-21 | Payer: MEDICAID

## 2022-02-17 ENCOUNTER — Ambulatory Visit: Admit: 2022-02-17 | Discharge: 2022-02-18 | Attending: Internal Medicine | Primary: Internal Medicine

## 2022-02-17 DIAGNOSIS — K7460 Unspecified cirrhosis of liver: Principal | ICD-10-CM

## 2022-02-17 DIAGNOSIS — R188 Other ascites: Principal | ICD-10-CM

## 2022-02-17 DIAGNOSIS — K754 Autoimmune hepatitis: Principal | ICD-10-CM

## 2022-02-17 MED ORDER — SPIRONOLACTONE 50 MG TABLET
ORAL_TABLET | Freq: Every day | ORAL | 11 refills | 30 days | Status: CP
Start: 2022-02-17 — End: ?

## 2022-02-17 MED ORDER — FUROSEMIDE 40 MG TABLET
ORAL_TABLET | Freq: Two times a day (BID) | ORAL | 11 refills | 30 days | Status: CP
Start: 2022-02-17 — End: ?

## 2022-02-21 MED ORDER — SULFAMETHOXAZOLE 800 MG-TRIMETHOPRIM 160 MG TABLET
ORAL_TABLET | Freq: Every day | ORAL | 0 refills | 90.00000 days | Status: CP
Start: 2022-02-21 — End: 2022-05-22

## 2022-03-06 DIAGNOSIS — K7682 Hepatic encephalopathy (CMS-HCC): Principal | ICD-10-CM

## 2022-03-09 ENCOUNTER — Ambulatory Visit: Admit: 2022-03-09 | Discharge: 2022-03-10 | Payer: MEDICAID

## 2022-03-09 DIAGNOSIS — Z5181 Encounter for therapeutic drug level monitoring: Principal | ICD-10-CM

## 2022-03-09 DIAGNOSIS — K7469 Other cirrhosis of liver: Principal | ICD-10-CM

## 2022-03-18 DIAGNOSIS — K754 Autoimmune hepatitis: Principal | ICD-10-CM

## 2022-03-30 ENCOUNTER — Ambulatory Visit: Admit: 2022-03-30 | Discharge: 2022-03-31 | Payer: MEDICAID

## 2022-04-19 DIAGNOSIS — K7210 Chronic hepatic failure without coma: Principal | ICD-10-CM

## 2022-04-19 DIAGNOSIS — I8500 Esophageal varices without bleeding: Principal | ICD-10-CM

## 2022-04-19 DIAGNOSIS — K766 Portal hypertension: Principal | ICD-10-CM

## 2022-04-19 DIAGNOSIS — K7460 Unspecified cirrhosis of liver: Principal | ICD-10-CM

## 2022-04-19 MED ORDER — PROPRANOLOL 20 MG TABLET
ORAL_TABLET | Freq: Two times a day (BID) | ORAL | 3 refills | 0 days
Start: 2022-04-19 — End: ?

## 2022-04-20 DIAGNOSIS — K7460 Unspecified cirrhosis of liver: Principal | ICD-10-CM

## 2022-04-20 DIAGNOSIS — K754 Autoimmune hepatitis: Principal | ICD-10-CM

## 2022-04-20 DIAGNOSIS — R188 Other ascites: Principal | ICD-10-CM

## 2022-04-20 MED ORDER — PROPRANOLOL 20 MG TABLET
ORAL_TABLET | Freq: Two times a day (BID) | ORAL | 3 refills | 90 days | Status: CP
Start: 2022-04-20 — End: ?

## 2022-04-20 MED ORDER — SPIRONOLACTONE 50 MG TABLET
ORAL_TABLET | Freq: Two times a day (BID) | ORAL | 3 refills | 90 days | Status: CP
Start: 2022-04-20 — End: ?

## 2022-04-24 DIAGNOSIS — K7460 Unspecified cirrhosis of liver: Principal | ICD-10-CM

## 2022-04-24 DIAGNOSIS — K754 Autoimmune hepatitis: Principal | ICD-10-CM

## 2022-04-24 MED ORDER — AZATHIOPRINE 50 MG TABLET
ORAL_TABLET | Freq: Every day | ORAL | 1 refills | 90 days | Status: CP
Start: 2022-04-24 — End: ?

## 2022-05-18 ENCOUNTER — Ambulatory Visit: Payer: Medicaid Other | Admitting: Internal Medicine

## 2022-05-18 ENCOUNTER — Encounter: Payer: Self-pay | Admitting: Internal Medicine

## 2022-05-18 ENCOUNTER — Ambulatory Visit: Payer: Medicaid Other | Attending: Internal Medicine | Admitting: Internal Medicine

## 2022-05-18 VITALS — BP 101/63 | HR 54 | Temp 98.4°F | Ht 62.0 in | Wt 176.0 lb

## 2022-05-18 DIAGNOSIS — R197 Diarrhea, unspecified: Secondary | ICD-10-CM | POA: Insufficient documentation

## 2022-05-18 DIAGNOSIS — K754 Autoimmune hepatitis: Secondary | ICD-10-CM | POA: Insufficient documentation

## 2022-05-18 DIAGNOSIS — Z23 Encounter for immunization: Secondary | ICD-10-CM

## 2022-05-18 DIAGNOSIS — Z79899 Other long term (current) drug therapy: Secondary | ICD-10-CM | POA: Insufficient documentation

## 2022-05-18 DIAGNOSIS — M7989 Other specified soft tissue disorders: Secondary | ICD-10-CM | POA: Insufficient documentation

## 2022-05-18 DIAGNOSIS — K7460 Unspecified cirrhosis of liver: Secondary | ICD-10-CM | POA: Insufficient documentation

## 2022-05-18 NOTE — Progress Notes (Signed)
Patient ID: Yolanda Guzman, female    DOB: 12/14/1975  MRN: [id number]  CC: Follow-up   Subjective: Yolanda Guzman is a 46 y.o. female who presents for chronic ds management Her concerns today include:    Reports being hosp at [facility] 2 mths ago with peritonitis.  Subsequently discharged on ciprofloxacin which was since changed to Bactrim.  She is also on rifaximin.  Endorses being on azathioprine, furosemide, spironolactone, propranolol and lactulose.  She has about 2-3 loose stools a day.  Reports good appetite and no early satiety.  Mild swelling in the legs.  Mild increase in abdominal girth.  She tells me that she is on the transplant list at [facility].  She will have insurance as of November through her husband. Labs reviewed from [facility] on care everywhere.  GFR is greater than 90.  Last liver function test done in July showed AST of 43 and total bilirubin of 3.4.  Last CBC revealed hemoglobin of 10.6 and platelet count of 43. She is agreeable to receiving flu shot today. Due for colon cancer screening.  She will wait until she has insurance for later this fall.   Patient Active Problem List   Diagnosis Date Noted   Autoimmune hepatitis (HCC) 03/24/2016   Vitamin D deficiency 03/16/2016   RUQ abdominal pain 11/02/2014   Cirrhosis (HCC) 11/02/2014     Current Outpatient Medications on File Prior to Visit  Medication Sig Dispense Refill   furosemide (LASIX) 40 MG tablet Take 1 tablet (40 mg total) by mouth daily. 30 tablet 5   propranolol (INDERAL) 20 MG tablet Take 1 tablet (20 mg total) by mouth 2 (two) times daily. 60 tablet 5   spironolactone (ALDACTONE) 25 MG tablet Take 1 tablet (25 mg total) by mouth daily. 30 tablet 5   loratadine (CLARITIN) 10 MG tablet Take 1 tablet by mouth twice a week as needed for cough/allergy symptoms (Patient not taking: Reported on 05/18/2022) 15 tablet 0   No current facility-administered medications on file prior to visit.    Allergies  Allergen  Reactions   Penicillins     Social History   Socioeconomic History   Marital status: Married    Spouse name: Not on file   Number of children: 2   Years of education: Not on file   Highest education level: Not on file  Occupational History   Not on file  Tobacco Use   Smoking status: Never   Smokeless tobacco: Never  Substance and Sexual Activity   Alcohol use: No    Alcohol/week: 0.0 standard drinks of alcohol   Drug use: No   Sexual activity: Yes  Other Topics Concern   Not on file  Social History Narrative   Not on file   Social Determinants of Health   Financial Resource Strain: Not on file  Food Insecurity: Not on file  Transportation Needs: Not on file  Physical Activity: Not on file  Stress: Not on file  Social Connections: Not on file  Intimate Partner Violence: Not on file    Family History  Problem Relation Age of Onset   Hypertension Mother    Diabetes Father     Past Surgical History:  Procedure Laterality Date   CESAREAN SECTION     IR PARACENTESIS  02/12/2021   LIVER BIOPSY  03-2015    ROS: Review of Systems Negative except as stated above  PHYSICAL EXAM: BP 101/63   Pulse (!) 54   Temp 98.4  F (36.9 C) (Oral)   Ht 5\' 2"  (1.575 m)   Wt 176 lb (79.8 kg)   SpO2 100%   BMI 32.19 kg/m   Physical Exam  General appearance - alert, well appearing, middle-aged Hispanic female who speaks English and in no distress Mental status - normal mood, behavior, speech, dress, motor activity, and thought processes Chest - clear to auscultation, no wheezes, rales or rhonchi, symmetric air entry Heart - normal rate, regular rhythm, normal S1, S2, no murmurs, rubs, clicks or gallops Abdomen -normal bowel sounds, soft, nontender, mild enlargement of the liver edge. Extremities -trace bilateral lower extremity edema      Latest Ref Rng & Units 04/24/2020    2:37 PM 06/27/2018   12:14 PM 06/02/2017    8:54 AM  CMP  Glucose 65 - 99 mg/dL 114  86  197    BUN 6 - 24 mg/dL 6  6  3    Creatinine 0.57 - 1.00 mg/dL 0.47  0.41  0.53   Sodium 134 - 144 mmol/L 137  138  139   Potassium 3.5 - 5.2 mmol/L 4.0  3.2  4.1   Chloride 96 - 106 mmol/L 105  106  106   CO2 20 - 29 mmol/L 25  22  22    Calcium 8.7 - 10.2 mg/dL 7.5  7.1  7.8   Total Protein 6.0 - 8.5 g/dL 5.2   5.6   Total Bilirubin 0.0 - 1.2 mg/dL 1.2   1.2   Alkaline Phos 48 - 121 IU/L 177   110   AST 0 - 40 IU/L 49   43   ALT 0 - 32 IU/L 31   24    Lipid Panel  No results found for: "CHOL", "TRIG", "HDL", "CHOLHDL", "VLDL", "LDLCALC", "LDLDIRECT"  CBC    Component Value Date/Time   WBC 2.0 (LL) 04/24/2020 1437   WBC 1.5 (L) 09/08/2016 1155   RBC 3.71 (L) 04/24/2020 1437   RBC 3.60 (L) 09/08/2016 1155   HGB 11.4 04/24/2020 1437   HCT 34.1 04/24/2020 1437   PLT 52 (LL) 04/24/2020 1437   MCV 92 04/24/2020 1437   MCH 30.7 04/24/2020 1437   MCH 31.1 09/08/2016 1155   MCHC 33.4 04/24/2020 1437   MCHC 33.0 09/08/2016 1155   RDW 14.9 04/24/2020 1437   LYMPHSABS 0.8 04/24/2020 1437   MONOABS 105 (L) 09/08/2016 1155   EOSABS 0.1 04/24/2020 1437   BASOSABS 0.0 04/24/2020 1437    ASSESSMENT AND PLAN: 1. Autoimmune hepatitis ([place]) 2. Cirrhosis of liver without ascites, unspecified hepatic cirrhosis type ([place]) -Followed by [facility] liver clinic/transplant team.  She has pancytopenia likely related to autoimmune hepatitis plus or minus azathioprine. -She will continue her medications as prescribed by them including furosemide, spironolactone, Xifaxan -Advised to report if she develops any significant ascites.  Discussed the importance of taking the furosemide and spironolactone to help keep the swelling in the legs down.  Advised to avoid eating role seafood especially shellfish.  3. Need for immunization against influenza - Flu Vaccine QUAD 70mo+IM (Fluarix, Fluzone & Alfiuria Quad PF)     Patient was given the opportunity to ask questions.  Patient verbalized understanding of the plan  and was able to repeat key elements of the plan.   This documentation was completed using Radio producer.  Any transcriptional errors are unintentional.  No orders of the defined types were placed in this encounter.    Requested Prescriptions    No prescriptions  requested or ordered in this encounter    No follow-ups on file.  Karle Plumber, MD, FACP

## 2022-06-17 ENCOUNTER — Ambulatory Visit: Admit: 2022-06-17 | Discharge: 2022-06-18

## 2022-06-17 DIAGNOSIS — K652 Spontaneous bacterial peritonitis: Principal | ICD-10-CM

## 2022-06-17 DIAGNOSIS — K7460 Unspecified cirrhosis of liver: Principal | ICD-10-CM

## 2022-06-17 DIAGNOSIS — K7290 Hepatic failure, unspecified without coma: Principal | ICD-10-CM

## 2022-06-17 MED ORDER — SULFAMETHOXAZOLE 800 MG-TRIMETHOPRIM 160 MG TABLET
ORAL_TABLET | Freq: Every day | ORAL | 4 refills | 90 days | Status: CP
Start: 2022-06-17 — End: ?

## 2022-08-28 ENCOUNTER — Institutional Professional Consult (permissible substitution): Admit: 2022-08-28 | Discharge: 2022-08-29 | Payer: PRIVATE HEALTH INSURANCE

## 2022-08-28 DIAGNOSIS — R188 Other ascites: Principal | ICD-10-CM

## 2022-08-28 DIAGNOSIS — K7460 Unspecified cirrhosis of liver: Principal | ICD-10-CM

## 2022-08-31 DIAGNOSIS — K7210 Chronic hepatic failure without coma: Principal | ICD-10-CM

## 2022-09-01 ENCOUNTER — Ambulatory Visit
Admit: 2022-09-01 | Discharge: 2022-09-02 | Disposition: A | Discharge status: Home / Self Care | Payer: BLUE CROSS/BLUE SHIELD | Attending: Emergency Medicine

## 2022-09-01 DIAGNOSIS — R188 Other ascites: Principal | ICD-10-CM

## 2022-09-01 DIAGNOSIS — E876 Hypokalemia: Principal | ICD-10-CM

## 2022-09-06 ENCOUNTER — Ambulatory Visit: Admit: 2022-09-06 | Discharge: 2022-09-13 | Payer: PRIVATE HEALTH INSURANCE

## 2022-09-06 ENCOUNTER — Encounter
Admit: 2022-09-06 | Discharge: 2022-09-13 | Disposition: A | Discharge status: Home / Self Care | Payer: PRIVATE HEALTH INSURANCE | Attending: Internal Medicine | Admitting: Internal Medicine

## 2022-09-06 ENCOUNTER — Encounter
Admit: 2022-09-06 | Discharge: 2022-09-13 | Disposition: A | Discharge status: Home / Self Care | Payer: PRIVATE HEALTH INSURANCE | Attending: Certified Registered" | Admitting: Internal Medicine

## 2022-09-06 ENCOUNTER — Encounter: Admit: 2022-09-06 | Discharge: 2022-09-13 | Payer: PRIVATE HEALTH INSURANCE | Attending: Internal Medicine

## 2022-09-06 ENCOUNTER — Ambulatory Visit
Admit: 2022-09-06 | Discharge: 2022-09-13 | Disposition: A | Discharge status: Home / Self Care | Payer: PRIVATE HEALTH INSURANCE | Admitting: Internal Medicine

## 2022-09-06 ENCOUNTER — Ambulatory Visit: Admit: 2022-09-06 | Payer: PRIVATE HEALTH INSURANCE

## 2022-09-13 MED ORDER — CARVEDILOL 3.125 MG TABLET
ORAL_TABLET | Freq: Two times a day (BID) | ORAL | 0 refills | 30.00000 days | Status: CP
Start: 2022-09-13 — End: 2022-10-13
  Filled 2022-09-13: qty 60, 30d supply, fill #0

## 2022-09-13 MED ORDER — FUROSEMIDE 40 MG TABLET
ORAL_TABLET | Freq: Two times a day (BID) | ORAL | 0 refills | 30.00000 days | Status: CP
Start: 2022-09-13 — End: 2022-10-13
  Filled 2022-09-13: qty 60, 30d supply, fill #0

## 2022-09-21 DIAGNOSIS — Z7682 Awaiting organ transplant status: Principal | ICD-10-CM

## 2022-09-21 DIAGNOSIS — K7689 Other specified diseases of liver: Principal | ICD-10-CM

## 2022-09-21 DIAGNOSIS — K7210 Chronic hepatic failure without coma: Principal | ICD-10-CM

## 2022-09-22 ENCOUNTER — Ambulatory Visit
Admit: 2022-09-22 | Discharge: 2022-09-22 | Payer: PRIVATE HEALTH INSURANCE | Attending: Internal Medicine | Primary: Internal Medicine

## 2022-09-22 ENCOUNTER — Institutional Professional Consult (permissible substitution): Admit: 2022-09-22 | Discharge: 2022-09-22 | Payer: PRIVATE HEALTH INSURANCE

## 2022-09-22 DIAGNOSIS — K7460 Unspecified cirrhosis of liver: Principal | ICD-10-CM

## 2022-09-22 DIAGNOSIS — Z01818 Encounter for other preprocedural examination: Principal | ICD-10-CM

## 2022-09-22 DIAGNOSIS — K7210 Chronic hepatic failure without coma: Principal | ICD-10-CM

## 2022-09-22 MED ORDER — PANTOPRAZOLE 20 MG TABLET,DELAYED RELEASE
ORAL_TABLET | Freq: Every day | ORAL | 3 refills | 90 days | Status: CP
Start: 2022-09-22 — End: 2023-09-22

## 2022-09-28 DIAGNOSIS — Z01818 Encounter for other preprocedural examination: Principal | ICD-10-CM

## 2022-09-28 DIAGNOSIS — K7460 Unspecified cirrhosis of liver: Principal | ICD-10-CM

## 2022-09-28 DIAGNOSIS — K7210 Chronic hepatic failure without coma: Principal | ICD-10-CM

## 2022-10-05 DIAGNOSIS — K7460 Unspecified cirrhosis of liver: Principal | ICD-10-CM

## 2022-10-05 DIAGNOSIS — K7210 Chronic hepatic failure without coma: Principal | ICD-10-CM

## 2022-10-05 DIAGNOSIS — Z01818 Encounter for other preprocedural examination: Principal | ICD-10-CM

## 2022-10-09 ENCOUNTER — Ambulatory Visit: Admit: 2022-10-09 | Discharge: 2022-10-10 | Payer: PRIVATE HEALTH INSURANCE

## 2022-10-19 DIAGNOSIS — K7460 Unspecified cirrhosis of liver: Principal | ICD-10-CM

## 2022-10-19 DIAGNOSIS — K7210 Chronic hepatic failure without coma: Principal | ICD-10-CM

## 2022-10-19 DIAGNOSIS — Z01818 Encounter for other preprocedural examination: Principal | ICD-10-CM

## 2022-10-26 DIAGNOSIS — K7210 Chronic hepatic failure without coma: Principal | ICD-10-CM

## 2022-10-26 DIAGNOSIS — K7460 Unspecified cirrhosis of liver: Principal | ICD-10-CM

## 2022-10-26 DIAGNOSIS — Z01818 Encounter for other preprocedural examination: Principal | ICD-10-CM

## 2022-11-01 ENCOUNTER — Encounter: Admit: 2022-11-01 | Payer: PRIVATE HEALTH INSURANCE

## 2022-11-01 ENCOUNTER — Ambulatory Visit: Admit: 2022-11-01 | Payer: PRIVATE HEALTH INSURANCE

## 2022-11-01 ENCOUNTER — Ambulatory Visit
Admit: 2022-11-01 | Discharge: 2022-11-10 | Disposition: A | Discharge status: Home / Self Care | Payer: PRIVATE HEALTH INSURANCE | Admitting: Student in an Organized Health Care Education/Training Program

## 2022-11-01 ENCOUNTER — Ambulatory Visit: Admit: 2022-11-01 | Discharge: 2022-11-10 | Payer: PRIVATE HEALTH INSURANCE

## 2022-11-02 DIAGNOSIS — K7210 Chronic hepatic failure without coma: Principal | ICD-10-CM

## 2022-11-02 DIAGNOSIS — K7460 Unspecified cirrhosis of liver: Principal | ICD-10-CM

## 2022-11-02 DIAGNOSIS — Z01818 Encounter for other preprocedural examination: Principal | ICD-10-CM

## 2022-11-09 DIAGNOSIS — K7460 Unspecified cirrhosis of liver: Principal | ICD-10-CM

## 2022-11-09 DIAGNOSIS — K7210 Chronic hepatic failure without coma: Principal | ICD-10-CM

## 2022-11-09 DIAGNOSIS — Z01818 Encounter for other preprocedural examination: Principal | ICD-10-CM

## 2022-11-10 MED ORDER — LACTULOSE 10 GRAM/15 ML ORAL SOLUTION
Freq: Two times a day (BID) | ORAL | 0 refills | 30.00000 days | Status: CP
Start: 2022-11-10 — End: 2022-12-10
  Filled 2022-11-10: qty 1800, 30d supply, fill #0

## 2022-11-10 MED ORDER — NYSTATIN 100,000 UNIT/ML ORAL SUSPENSION
Freq: Four times a day (QID) | ORAL | 0 refills | 2.00000 days | Status: CP
Start: 2022-11-10 — End: 2022-11-12
  Filled 2022-11-10: qty 30, 2d supply, fill #0

## 2022-11-10 MED ORDER — CIPROFLOXACIN 750 MG TABLET
ORAL_TABLET | Freq: Two times a day (BID) | ORAL | 0 refills | 5.00000 days | Status: CP
Start: 2022-11-10 — End: 2022-11-15
  Filled 2022-11-10: qty 30, 30d supply, fill #0
  Filled 2022-11-10: qty 9, 5d supply, fill #0

## 2022-11-10 MED ORDER — FUROSEMIDE 40 MG TABLET
ORAL_TABLET | Freq: Two times a day (BID) | ORAL | 0 refills | 30.00000 days | Status: CP
Start: 2022-11-10 — End: 2022-12-10

## 2022-11-10 MED ORDER — CARVEDILOL 3.125 MG TABLET
ORAL_TABLET | Freq: Two times a day (BID) | ORAL | 0 refills | 30.00000 days | Status: CP
Start: 2022-11-10 — End: 2022-12-10
  Filled 2022-11-10: qty 60, 30d supply, fill #0

## 2022-11-10 MED ORDER — VALACYCLOVIR 1 GRAM TABLET
ORAL_TABLET | Freq: Two times a day (BID) | ORAL | 0 refills | 10.00000 days | Status: CP
Start: 2022-11-10 — End: 2022-11-20
  Filled 2022-11-10: qty 19, 10d supply, fill #0

## 2022-11-10 MED ORDER — MAGNESIUM OXIDE 400 MG (241.3 MG MAGNESIUM) TABLET
ORAL_TABLET | Freq: Every day | ORAL | 0 refills | 120.00000 days | Status: CP
Start: 2022-11-10 — End: 2023-03-10
  Filled 2022-11-10: qty 120, 120d supply, fill #0

## 2022-11-15 MED ORDER — CIPROFLOXACIN 500 MG TABLET
ORAL_TABLET | Freq: Every day | ORAL | 3 refills | 90.00000 days | Status: CP
Start: 2022-11-15 — End: 2023-11-15

## 2022-11-16 ENCOUNTER — Ambulatory Visit: Payer: Medicaid Other | Admitting: Internal Medicine

## 2022-11-16 DIAGNOSIS — K7210 Chronic hepatic failure without coma: Principal | ICD-10-CM

## 2022-11-16 DIAGNOSIS — Z01818 Encounter for other preprocedural examination: Principal | ICD-10-CM

## 2022-11-16 DIAGNOSIS — K7460 Unspecified cirrhosis of liver: Principal | ICD-10-CM

## 2022-11-20 ENCOUNTER — Institutional Professional Consult (permissible substitution): Admit: 2022-11-20 | Discharge: 2022-11-21 | Payer: PRIVATE HEALTH INSURANCE

## 2022-11-20 ENCOUNTER — Ambulatory Visit: Admit: 2022-11-20 | Discharge: 2022-11-21 | Payer: PRIVATE HEALTH INSURANCE

## 2022-11-23 DIAGNOSIS — K7460 Unspecified cirrhosis of liver: Principal | ICD-10-CM

## 2022-11-23 DIAGNOSIS — K7210 Chronic hepatic failure without coma: Principal | ICD-10-CM

## 2022-11-23 DIAGNOSIS — Z01818 Encounter for other preprocedural examination: Principal | ICD-10-CM

## 2022-11-27 ENCOUNTER — Ambulatory Visit
Admit: 2022-11-27 | Discharge: 2022-11-28 | Payer: PRIVATE HEALTH INSURANCE | Attending: Infectious Disease | Primary: Infectious Disease

## 2022-11-27 DIAGNOSIS — K652 Spontaneous bacterial peritonitis: Principal | ICD-10-CM

## 2022-11-27 MED ORDER — IVERMECTIN 3 MG TABLET
ORAL_TABLET | Freq: Every day | ORAL | 0 refills | 2.00000 days | Status: CP
Start: 2022-11-27 — End: 2022-11-29

## 2022-11-30 DIAGNOSIS — K7460 Unspecified cirrhosis of liver: Principal | ICD-10-CM

## 2022-11-30 DIAGNOSIS — K7210 Chronic hepatic failure without coma: Principal | ICD-10-CM

## 2022-11-30 DIAGNOSIS — Z01818 Encounter for other preprocedural examination: Principal | ICD-10-CM

## 2022-12-07 DIAGNOSIS — K7460 Unspecified cirrhosis of liver: Principal | ICD-10-CM

## 2022-12-07 DIAGNOSIS — Z01818 Encounter for other preprocedural examination: Principal | ICD-10-CM

## 2022-12-07 DIAGNOSIS — K7210 Chronic hepatic failure without coma: Principal | ICD-10-CM

## 2022-12-14 DIAGNOSIS — K7210 Chronic hepatic failure without coma: Principal | ICD-10-CM

## 2022-12-14 DIAGNOSIS — Z01818 Encounter for other preprocedural examination: Principal | ICD-10-CM

## 2022-12-14 DIAGNOSIS — K7460 Unspecified cirrhosis of liver: Principal | ICD-10-CM

## 2022-12-16 ENCOUNTER — Ambulatory Visit: Admit: 2022-12-16 | Discharge: 2022-12-17 | Payer: PRIVATE HEALTH INSURANCE

## 2022-12-16 DIAGNOSIS — K754 Autoimmune hepatitis: Principal | ICD-10-CM

## 2022-12-16 DIAGNOSIS — K7460 Unspecified cirrhosis of liver: Principal | ICD-10-CM

## 2022-12-16 DIAGNOSIS — R188 Other ascites: Principal | ICD-10-CM

## 2022-12-16 MED ORDER — LACTULOSE 10 GRAM/15 ML ORAL SOLUTION
Freq: Two times a day (BID) | ORAL | 11 refills | 30 days | Status: CP
Start: 2022-12-16 — End: ?

## 2022-12-16 MED ORDER — IVERMECTIN 3 MG TABLET
ORAL_TABLET | Freq: Every day | ORAL | 0 refills | 2 days | Status: CP
Start: 2022-12-16 — End: 2022-12-18

## 2022-12-16 MED ORDER — CARVEDILOL 6.25 MG TABLET
ORAL_TABLET | Freq: Every evening | ORAL | 11 refills | 30 days | Status: CP
Start: 2022-12-16 — End: ?

## 2022-12-16 MED ORDER — FUROSEMIDE 40 MG TABLET
ORAL_TABLET | Freq: Two times a day (BID) | ORAL | 11 refills | 30 days | Status: CP
Start: 2022-12-16 — End: ?

## 2022-12-21 DIAGNOSIS — K7210 Chronic hepatic failure without coma: Principal | ICD-10-CM

## 2022-12-21 DIAGNOSIS — K7460 Unspecified cirrhosis of liver: Principal | ICD-10-CM

## 2022-12-21 DIAGNOSIS — Z01818 Encounter for other preprocedural examination: Principal | ICD-10-CM

## 2022-12-23 DIAGNOSIS — K754 Autoimmune hepatitis: Principal | ICD-10-CM

## 2022-12-23 DIAGNOSIS — K7460 Unspecified cirrhosis of liver: Principal | ICD-10-CM

## 2022-12-23 MED ORDER — AZATHIOPRINE 50 MG TABLET
ORAL_TABLET | Freq: Every day | ORAL | 1 refills | 90 days | Status: CP
Start: 2022-12-23 — End: ?

## 2022-12-28 DIAGNOSIS — Z01818 Encounter for other preprocedural examination: Principal | ICD-10-CM

## 2022-12-28 DIAGNOSIS — K7460 Unspecified cirrhosis of liver: Principal | ICD-10-CM

## 2022-12-28 DIAGNOSIS — K7210 Chronic hepatic failure without coma: Principal | ICD-10-CM

## 2022-12-29 ENCOUNTER — Encounter
Admit: 2022-12-29 | Discharge: 2022-12-29 | Payer: PRIVATE HEALTH INSURANCE | Attending: Student in an Organized Health Care Education/Training Program | Primary: Student in an Organized Health Care Education/Training Program

## 2022-12-29 ENCOUNTER — Ambulatory Visit: Admit: 2022-12-29 | Discharge: 2022-12-29 | Payer: PRIVATE HEALTH INSURANCE

## 2023-01-04 DIAGNOSIS — K7460 Unspecified cirrhosis of liver: Principal | ICD-10-CM

## 2023-01-04 DIAGNOSIS — Z01818 Encounter for other preprocedural examination: Principal | ICD-10-CM

## 2023-01-04 DIAGNOSIS — K7210 Chronic hepatic failure without coma: Principal | ICD-10-CM

## 2023-01-11 DIAGNOSIS — Z01818 Encounter for other preprocedural examination: Principal | ICD-10-CM

## 2023-01-11 DIAGNOSIS — K7210 Chronic hepatic failure without coma: Principal | ICD-10-CM

## 2023-01-11 DIAGNOSIS — K7460 Unspecified cirrhosis of liver: Principal | ICD-10-CM

## 2023-01-15 ENCOUNTER — Ambulatory Visit
Admit: 2023-01-15 | Discharge: 2023-01-16 | Payer: PRIVATE HEALTH INSURANCE | Attending: Infectious Disease | Primary: Infectious Disease

## 2023-01-15 DIAGNOSIS — Z7682 Awaiting organ transplant status: Principal | ICD-10-CM

## 2023-01-15 DIAGNOSIS — Z880 Allergy status to penicillin: Principal | ICD-10-CM

## 2023-01-18 DIAGNOSIS — K7210 Chronic hepatic failure without coma: Principal | ICD-10-CM

## 2023-01-18 DIAGNOSIS — K7460 Unspecified cirrhosis of liver: Principal | ICD-10-CM

## 2023-01-18 DIAGNOSIS — Z01818 Encounter for other preprocedural examination: Principal | ICD-10-CM

## 2023-01-25 DIAGNOSIS — K7460 Unspecified cirrhosis of liver: Principal | ICD-10-CM

## 2023-01-25 DIAGNOSIS — Z01818 Encounter for other preprocedural examination: Principal | ICD-10-CM

## 2023-01-25 DIAGNOSIS — K7210 Chronic hepatic failure without coma: Principal | ICD-10-CM

## 2023-02-01 MED ORDER — CALCIUM 500 MG (AS CALCIUM CARBONATE 1,250 MG) CHEWABLE TABLET
ORAL_TABLET | Freq: Two times a day (BID) | ORAL | 11 refills | 30 days
Start: 2023-02-01 — End: 2024-02-01

## 2023-02-02 ENCOUNTER — Ambulatory Visit: Admit: 2023-02-02 | Discharge: 2023-02-03 | Payer: PRIVATE HEALTH INSURANCE

## 2023-02-02 ENCOUNTER — Encounter
Admit: 2023-02-02 | Discharge: 2023-02-06 | Disposition: A | Discharge status: Home / Self Care | Payer: PRIVATE HEALTH INSURANCE | Source: Ambulatory Visit | Attending: Student in an Organized Health Care Education/Training Program | Admitting: Student in an Organized Health Care Education/Training Program

## 2023-02-02 ENCOUNTER — Ambulatory Visit: Admit: 2023-02-02 | Discharge: 2023-02-06 | Payer: PRIVATE HEALTH INSURANCE

## 2023-02-02 ENCOUNTER — Ambulatory Visit
Admit: 2023-02-02 | Discharge: 2023-02-06 | Disposition: A | Discharge status: Home / Self Care | Payer: PRIVATE HEALTH INSURANCE | Source: Ambulatory Visit | Admitting: Student in an Organized Health Care Education/Training Program

## 2023-02-05 MED ORDER — CALCIUM 500 MG (AS CALCIUM CARBONATE 1,250 MG) CHEWABLE TABLET
ORAL_TABLET | Freq: Two times a day (BID) | ORAL | 11 refills | 30 days | Status: CP
Start: 2023-02-05 — End: 2024-02-05

## 2023-02-06 MED ORDER — SPIRONOLACTONE 50 MG TABLET
ORAL_TABLET | Freq: Every day | ORAL | 1 refills | 30 days | Status: CP
Start: 2023-02-06 — End: 2023-04-07
  Filled 2023-02-06: qty 90, 30d supply, fill #0

## 2023-02-07 MED ORDER — BUMETANIDE 1 MG TABLET
ORAL_TABLET | Freq: Every day | ORAL | 2 refills | 30 days | Status: CP
Start: 2023-02-07 — End: 2023-05-08
  Filled 2023-02-06: qty 30, 30d supply, fill #0

## 2023-02-08 DIAGNOSIS — K7460 Unspecified cirrhosis of liver: Principal | ICD-10-CM

## 2023-02-08 DIAGNOSIS — K7210 Chronic hepatic failure without coma: Principal | ICD-10-CM

## 2023-02-08 DIAGNOSIS — Z01818 Encounter for other preprocedural examination: Principal | ICD-10-CM

## 2023-02-08 MED ORDER — ONDANSETRON 4 MG DISINTEGRATING TABLET
ORAL_TABLET | Freq: Three times a day (TID) | 0 refills | 90 days | Status: CP | PRN
Start: 2023-02-08 — End: 2023-05-09

## 2023-02-09 ENCOUNTER — Ambulatory Visit
Admit: 2023-02-09 | Discharge: 2023-02-10 | Payer: PRIVATE HEALTH INSURANCE | Attending: Internal Medicine | Primary: Internal Medicine

## 2023-02-09 DIAGNOSIS — Z7682 Awaiting organ transplant status: Principal | ICD-10-CM

## 2023-02-09 DIAGNOSIS — Z880 Allergy status to penicillin: Principal | ICD-10-CM

## 2023-02-10 ENCOUNTER — Telehealth: Admit: 2023-02-10 | Discharge: 2023-02-10 | Payer: PRIVATE HEALTH INSURANCE

## 2023-02-10 DIAGNOSIS — K7460 Unspecified cirrhosis of liver: Principal | ICD-10-CM

## 2023-02-14 ENCOUNTER — Encounter: Admit: 2023-02-14 | Discharge: 2023-02-14 | Payer: PRIVATE HEALTH INSURANCE | Attending: Surgery | Primary: Surgery

## 2023-02-15 ENCOUNTER — Ambulatory Visit: Admit: 2023-02-15 | Payer: PRIVATE HEALTH INSURANCE

## 2023-02-15 ENCOUNTER — Ambulatory Visit
Admit: 2023-02-15 | Discharge: 2023-02-23 | Disposition: A | Discharge status: Home / Self Care | Payer: PRIVATE HEALTH INSURANCE

## 2023-02-15 ENCOUNTER — Ambulatory Visit: Admit: 2023-02-15 | Discharge: 2023-02-23 | Payer: PRIVATE HEALTH INSURANCE

## 2023-02-15 ENCOUNTER — Encounter: Admit: 2023-02-15 | Payer: PRIVATE HEALTH INSURANCE

## 2023-02-15 DIAGNOSIS — K7210 Chronic hepatic failure without coma: Principal | ICD-10-CM

## 2023-02-15 DIAGNOSIS — Z940 Kidney transplant status: Principal | ICD-10-CM

## 2023-02-15 DIAGNOSIS — K7460 Unspecified cirrhosis of liver: Principal | ICD-10-CM

## 2023-02-15 DIAGNOSIS — Z01818 Encounter for other preprocedural examination: Principal | ICD-10-CM

## 2023-02-16 NOTE — Unmapped (Addendum)
Alicia Armstrong is a 47 y.o. female with history of  has a past medical history of Cirrhosis (CMS-HCC) who presents for liver transplant.     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 Patient remained intuabted in the operating room, and she was transferred to SICU.     On 02/15/2023, patient was transferred to the SICU and remained stable. She received one unit of packed RBCs.    On 02/16/2023, the patient was extubated without incident.    Her diet was slowly advanced and at the time of discharge she was tolerating a regular diet. She was able to void spontaneously and post op pain was well-controlled with P.O. pain medication. She was assessed by Physical Therapy and Occupational Therapy and found to be suitable for discharge to home. Lateral JP drain was removed POD#7 with stitches in place. She was discharged home in stable condition with a prescription for oral pain medications and scheduled to follow up with [place] in 1 weeks.      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 Norma Fredrickson, KDPI 4%, CIT 0, cPRA 0  -post op course c/b DGF? No    Immunosuppression:  -Induction: Thymoglobulin   -Tac at dc: 10mg  BID  -Cellcept at dc: 250mg  BID  -Pred at dc: 20mg     Prophylaxis/ID:  -PJP: Pentamidine monthly x 6 months, last dose 02/20/23  -CMV: CMV (D-/R+ or D+/R+): Moderate Risk, 450mg  Valcyte daily x 3 months, renally dose adjusted  -ASA 81 at dc: no

## 2023-02-22 DIAGNOSIS — Z7952 Long term (current) use of systemic steroids: Principal | ICD-10-CM

## 2023-02-22 DIAGNOSIS — K7460 Unspecified cirrhosis of liver: Principal | ICD-10-CM

## 2023-02-22 DIAGNOSIS — K7210 Chronic hepatic failure without coma: Principal | ICD-10-CM

## 2023-02-22 DIAGNOSIS — Z944 Liver transplant status: Principal | ICD-10-CM

## 2023-02-22 DIAGNOSIS — Z01818 Encounter for other preprocedural examination: Principal | ICD-10-CM

## 2023-02-22 MED ORDER — BLOOD GLUCOSE TEST STRIPS
ORAL_STRIP | 0 refills | 0 days | Status: CP
Start: 2023-02-22 — End: ?

## 2023-02-22 MED ORDER — ACETAMINOPHEN 325 MG TABLET
ORAL_TABLET | Freq: Three times a day (TID) | ORAL | 11 refills | 10 days | Status: CP | PRN
Start: 2023-02-22 — End: ?
  Filled 2023-02-23: qty 60, 10d supply, fill #0

## 2023-02-22 MED ORDER — MG-PLUS-PROTEIN 133 MG TABLET
ORAL_TABLET | Freq: Two times a day (BID) | ORAL | 6 refills | 50 days | Status: CP
Start: 2023-02-22 — End: ?
  Filled 2023-02-23: qty 100, 50d supply, fill #0

## 2023-02-22 MED ORDER — MYCOPHENOLATE MOFETIL 250 MG CAPSULE
ORAL_CAPSULE | Freq: Two times a day (BID) | ORAL | 11 refills | 30.00000 days | Status: CP
Start: 2023-02-22 — End: 2023-02-22
  Filled 2023-02-23: qty 60, 30d supply, fill #0

## 2023-02-22 MED ORDER — METHOCARBAMOL 500 MG TABLET
ORAL_TABLET | Freq: Three times a day (TID) | ORAL | 11 refills | 30 days | Status: CP | PRN
Start: 2023-02-22 — End: ?
  Filled 2023-02-23: qty 90, 30d supply, fill #0

## 2023-02-22 MED ORDER — TACROLIMUS 1 MG CAPSULE, IMMEDIATE-RELEASE
ORAL_CAPSULE | Freq: Two times a day (BID) | ORAL | 11 refills | 30 days | Status: CP
Start: 2023-02-22 — End: 2023-02-22

## 2023-02-22 MED ORDER — LANCETS
0 refills | 0 days | Status: CP
Start: 2023-02-22 — End: ?
  Filled 2023-02-23: qty 100, 25d supply, fill #0

## 2023-02-22 MED ORDER — POLYETHYLENE GLYCOL 3350 17 GRAM ORAL POWDER PACKET
PACK | Freq: Every day | ORAL | 11 refills | 30 days | Status: CP | PRN
Start: 2023-02-22 — End: ?
  Filled 2023-02-23: qty 30, 30d supply, fill #0

## 2023-02-22 MED ORDER — CALCIUM 200 MG (AS CALCIUM CARBONATE 500 MG) CHEWABLE TABLET
ORAL_TABLET | Freq: Three times a day (TID) | ORAL | 5 refills | 25 days | Status: CP
Start: 2023-02-22 — End: ?
  Filled 2023-02-23: qty 150, 50d supply, fill #0

## 2023-02-22 MED ORDER — SITAGLIPTIN PHOSPHATE 100 MG TABLET
ORAL_TABLET | Freq: Every day | ORAL | 3 refills | 90 days | Status: CP
Start: 2023-02-22 — End: 2024-02-22
  Filled 2023-02-23: qty 90, 90d supply, fill #0

## 2023-02-22 MED ORDER — VALGANCICLOVIR 450 MG TABLET
ORAL_TABLET | Freq: Every day | ORAL | 2 refills | 30 days | Status: CP
Start: 2023-02-22 — End: 2023-05-23
  Filled 2023-02-23: qty 30, 30d supply, fill #0

## 2023-02-22 MED ORDER — ASPIRIN 81 MG TABLET,DELAYED RELEASE
ORAL_TABLET | Freq: Every day | ORAL | 11 refills | 30 days | Status: CP
Start: 2023-02-22 — End: ?
  Filled 2023-02-23: qty 30, 30d supply, fill #0

## 2023-02-22 MED ORDER — GABAPENTIN 400 MG CAPSULE
ORAL_CAPSULE | Freq: Three times a day (TID) | ORAL | 3 refills | 30 days | Status: CP
Start: 2023-02-22 — End: ?
  Filled 2023-02-23: qty 90, 30d supply, fill #0

## 2023-02-22 MED ORDER — BLOOD-GLUCOSE METER KIT WRAPPER
0 refills | 0 days | Status: CP
Start: 2023-02-22 — End: 2024-02-22

## 2023-02-22 MED ORDER — DOCUSATE SODIUM 100 MG CAPSULE
ORAL_CAPSULE | Freq: Two times a day (BID) | ORAL | 11 refills | 30 days | Status: CP | PRN
Start: 2023-02-22 — End: ?
  Filled 2023-02-23: qty 60, 30d supply, fill #0

## 2023-02-23 DIAGNOSIS — Z944 Liver transplant status: Principal | ICD-10-CM

## 2023-02-23 LAB — BASIC METABOLIC PANEL
ANION GAP: 3 mmol/L — ABNORMAL LOW (ref 5–14)
BLOOD UREA NITROGEN: 20 mg/dL (ref 9–23)
BUN / CREAT RATIO: 29
CALCIUM: 8.4 mg/dL — ABNORMAL LOW (ref 8.7–10.4)
CHLORIDE: 108 mmol/L — ABNORMAL HIGH (ref 98–107)
CO2: 28 mmol/L (ref 20.0–31.0)
CREATININE: 0.7 mg/dL
EGFR CKD-EPI (2021) FEMALE: >90 mL/min/{1.73_m2} (ref >=60)
GLUCOSE RANDOM: 108 mg/dL (ref 70–179)
POTASSIUM: 4.8 mmol/L (ref 3.4–4.8)
SODIUM: 139 mmol/L (ref 135–145)

## 2023-02-23 LAB — HEPATIC FUNCTION PANEL
ALBUMIN: 3.1 g/dL — ABNORMAL LOW (ref 3.4–5.0)
ALKALINE PHOSPHATASE: 71 U/L (ref 46–116)
ALT (SGPT): 100 U/L — ABNORMAL HIGH (ref 10–49)
AST (SGOT): 32 U/L (ref <=34)
BILIRUBIN DIRECT: 0.8 mg/dL — ABNORMAL HIGH (ref 0.00–0.30)
BILIRUBIN TOTAL: 1.5 mg/dL — ABNORMAL HIGH (ref 0.3–1.2)
PROTEIN TOTAL: 4.7 g/dL — ABNORMAL LOW (ref 5.7–8.2)

## 2023-02-23 LAB — CBC W/ AUTO DIFF
BASOPHILS ABSOLUTE COUNT: 0 10*9/L (ref 0.0–0.1)
BASOPHILS RELATIVE PERCENT: 0.7 %
EOSINOPHILS ABSOLUTE COUNT: 0.1 10*9/L (ref 0.0–0.5)
EOSINOPHILS RELATIVE PERCENT: 5.3 %
HEMATOCRIT: 27.5 % — ABNORMAL LOW (ref 34.0–44.0)
HEMOGLOBIN: 9.4 g/dL — ABNORMAL LOW (ref 11.3–14.9)
LYMPHOCYTES ABSOLUTE COUNT: 0.4 10*9/L — ABNORMAL LOW (ref 1.1–3.6)
LYMPHOCYTES RELATIVE PERCENT: 14.9 %
MEAN CORPUSCULAR HEMOGLOBIN CONC: 34.2 g/dL (ref 32.0–36.0)
MEAN CORPUSCULAR HEMOGLOBIN: 33.1 pg — ABNORMAL HIGH (ref 25.9–32.4)
MEAN CORPUSCULAR VOLUME: 96.5 fL — ABNORMAL HIGH (ref 77.6–95.7)
MEAN PLATELET VOLUME: 9.2 fL (ref 6.8–10.7)
MONOCYTES ABSOLUTE COUNT: 0.2 10*9/L — ABNORMAL LOW (ref 0.3–0.8)
MONOCYTES RELATIVE PERCENT: 6 %
NEUTROPHILS ABSOLUTE COUNT: 2 10*9/L (ref 1.8–7.8)
NEUTROPHILS RELATIVE PERCENT: 73.1 %
PLATELET COUNT: 53 10*9/L — ABNORMAL LOW (ref 150–450)
RED BLOOD CELL COUNT: 2.85 10*12/L — ABNORMAL LOW (ref 3.95–5.13)
RED CELL DISTRIBUTION WIDTH: 20.5 % — ABNORMAL HIGH (ref 12.2–15.2)
WBC ADJUSTED: 2.8 10*9/L — ABNORMAL LOW (ref 3.6–11.2)

## 2023-02-23 LAB — PROTIME-INR
INR: 1.06
PROTIME: 11.8 s (ref 9.9–12.6)

## 2023-02-23 LAB — GAMMA GT: GAMMA GLUTAMYL TRANSFERASE: 131 U/L — ABNORMAL HIGH

## 2023-02-23 LAB — APTT
APTT: 25.3 s (ref 24.8–38.4)
HEPARIN CORRELATION: <0.2

## 2023-02-23 LAB — AMYLASE: AMYLASE: 29 U/L — ABNORMAL LOW (ref 30–118)

## 2023-02-23 LAB — PHOSPHORUS: PHOSPHORUS: 4.8 mg/dL (ref 2.4–5.1)

## 2023-02-23 LAB — TACROLIMUS LEVEL, TROUGH: TACROLIMUS, TROUGH: 10.7 ng/mL (ref 5.0–15.0)

## 2023-02-23 LAB — MAGNESIUM: MAGNESIUM: 1.9 mg/dL (ref 1.6–2.6)

## 2023-02-23 MED ORDER — PEN NEEDLE, DIABETIC 32 GAUGE X 5/32" (4 MM)
0 refills | 0 days | Status: CP
Start: 2023-02-23 — End: ?

## 2023-02-23 MED ORDER — BLOOD-GLUCOSE METER
0 refills | 0 days | Status: CP
Start: 2023-02-23 — End: ?
  Filled 2023-02-23: qty 1, 1d supply, fill #0

## 2023-02-23 MED ORDER — PREDNISONE 5 MG TABLET
ORAL_TABLET | Freq: Every day | ORAL | 11 refills | 30 days | Status: CP
Start: 2023-02-23 — End: ?
  Filled 2023-02-23: qty 30, 30d supply, fill #0

## 2023-02-23 MED ORDER — TACROLIMUS 1 MG CAPSULE, IMMEDIATE-RELEASE
ORAL_CAPSULE | Freq: Two times a day (BID) | ORAL | 11 refills | 33 days | Status: CP
Start: 2023-02-23 — End: ?
  Filled 2023-02-23: qty 600, 33d supply, fill #0

## 2023-02-23 MED ORDER — ASPIRIN 81 MG TABLET,DELAYED RELEASE
ORAL_TABLET | Freq: Every day | ORAL | 11 refills | 30.00000 days | Status: CP
Start: 2023-02-23 — End: 2023-02-23

## 2023-02-23 MED ORDER — INSULIN LISPRO (U-100) 100 UNIT/ML SUBCUTANEOUS PEN
Freq: Three times a day (TID) | SUBCUTANEOUS | 11 refills | 50.00000 days | Status: CP
Start: 2023-02-23 — End: 2023-02-23
  Filled 2023-02-23: qty 15, 50d supply, fill #0

## 2023-02-23 MED ORDER — PANTOPRAZOLE 40 MG TABLET,DELAYED RELEASE
ORAL_TABLET | Freq: Every day | ORAL | 11 refills | 30 days | Status: CP
Start: 2023-02-23 — End: ?
  Filled 2023-02-23: qty 30, 30d supply, fill #0

## 2023-02-23 MED ORDER — OXYCODONE 5 MG TABLET
ORAL_TABLET | ORAL | 0 refills | 2 days | Status: CP | PRN
Start: 2023-02-23 — End: 2023-02-28
  Filled 2023-02-23: qty 10, 2d supply, fill #0

## 2023-02-23 MED ORDER — CALCIUM 200 MG (AS CALCIUM CARBONATE 500 MG) CHEWABLE TABLET
ORAL_TABLET | Freq: Three times a day (TID) | ORAL | 0 refills | 50 days | Status: CP
Start: 2023-02-23 — End: 2023-04-14

## 2023-02-23 MED ADMIN — tacrolimus (PROGRAF) capsule 10 mg: 10 mg | ORAL | @ 10:00:00 | Stop: 2023-02-23

## 2023-02-23 MED ADMIN — oxyCODONE (ROXICODONE) immediate release tablet 10 mg: 10 mg | ORAL | @ 12:00:00 | Stop: 2023-02-23

## 2023-02-23 MED ADMIN — mycophenolate (CELLCEPT) capsule 250 mg: 250 mg | ORAL | @ 13:00:00 | Stop: 2023-02-23

## 2023-02-23 MED ADMIN — calcium carbonate (TUMS) chewable tablet 400 mg elem calcium: 400 mg | ORAL | @ 10:00:00 | Stop: 2023-02-23

## 2023-02-23 MED ADMIN — nystatin (MYCOSTATIN) oral suspension: 10 mL | ORAL | @ 17:00:00 | Stop: 2023-02-23

## 2023-02-23 MED ADMIN — insulin NPH (HumuLIN,NovoLIN) injection 8 Units: 8 [IU] | SUBCUTANEOUS | @ 13:00:00 | Stop: 2023-02-23

## 2023-02-23 MED ADMIN — lidocaine 4 % patch 3 patch: 3 | TRANSDERMAL | @ 13:00:00 | Stop: 2023-02-23

## 2023-02-23 MED ADMIN — furosemide (LASIX) tablet 20 mg: 20 mg | ORAL | @ 13:00:00 | Stop: 2023-02-23

## 2023-02-23 MED ADMIN — nystatin (MYCOSTATIN) oral suspension: 10 mL | ORAL | @ 10:00:00 | Stop: 2023-02-23

## 2023-02-23 MED ADMIN — albumin human 25 % 25 g: 25 g | INTRAVENOUS | @ 13:00:00 | Stop: 2023-02-23

## 2023-02-23 MED ADMIN — spironolactone (ALDACTONE) tablet 50 mg: 50 mg | ORAL | @ 13:00:00 | Stop: 2023-02-23

## 2023-02-23 MED ADMIN — insulin lispro (HumaLOG) injection 6 Units: 6 [IU] | SUBCUTANEOUS | @ 17:00:00 | Stop: 2023-02-23

## 2023-02-23 MED ADMIN — valGANciclovir (VALCYTE) tablet 450 mg: 450 mg | ORAL | @ 13:00:00 | Stop: 2023-02-23

## 2023-02-23 MED ADMIN — calcium carbonate (TUMS) chewable tablet 400 mg elem calcium: 400 mg | ORAL | @ 17:00:00 | Stop: 2023-02-23

## 2023-02-23 MED ADMIN — polyethylene glycol (MIRALAX) packet 17 g: 17 g | ORAL | @ 13:00:00 | Stop: 2023-02-23

## 2023-02-23 MED ADMIN — insulin lispro (HumaLOG) injection 0-20 Units: 0-20 [IU] | SUBCUTANEOUS | @ 17:00:00 | Stop: 2023-02-23

## 2023-02-23 MED ADMIN — methocarbamol (ROBAXIN) tablet 500 mg: 500 mg | ORAL | @ 13:00:00 | Stop: 2023-02-23

## 2023-02-23 MED ADMIN — pantoprazole (Protonix) EC tablet 40 mg: 40 mg | ORAL | @ 13:00:00 | Stop: 2023-02-23

## 2023-02-23 MED ADMIN — predniSONE (DELTASONE) tablet 10 mg: 10 mg | ORAL | @ 13:00:00 | Stop: 2023-02-23

## 2023-02-23 MED ADMIN — methocarbamol (ROBAXIN) tablet 500 mg: 500 mg | ORAL | @ 17:00:00 | Stop: 2023-02-23

## 2023-02-23 MED ADMIN — gabapentin (NEURONTIN) capsule 400 mg: 400 mg | ORAL | @ 17:00:00 | Stop: 2023-02-23

## 2023-02-23 MED FILL — UNIFINE PENTIPS 32 GAUGE X 5/32" NEEDLE (4 MM): 25 days supply | Qty: 100 | Fill #0

## 2023-02-23 MED FILL — ACCU-CHEK GUIDE TEST STRIPS: 25 days supply | Qty: 100 | Fill #0

## 2023-02-23 NOTE — Unmapped (Signed)
Problem: Wound  Goal: Optimal Coping  Outcome: Resolved  Goal: Optimal Functional Ability  Outcome: Resolved  Goal: Absence of Infection Signs and Symptoms  Outcome: Resolved  Intervention: Prevent or Manage Infection  Recent Flowsheet Documentation  Taken 02/23/2023 0800 by Hassan Buckler, RN  Infection Management: aseptic technique maintained  Goal: Improved Oral Intake  Outcome: Resolved  Goal: Optimal Pain Control and Function  Outcome: Resolved  Goal: Skin Health and Integrity  Outcome: Resolved  Intervention: Optimize Skin Protection  Recent Flowsheet Documentation  Taken 02/23/2023 1200 by Hassan Buckler, RN  Pressure Reduction Techniques: frequent weight shift encouraged  Taken 02/23/2023 0800 by Hassan Buckler, RN  Pressure Reduction Techniques: frequent weight shift encouraged  Goal: Optimal Wound Healing  Outcome: Resolved     Problem: Adult Inpatient Plan of Care  Goal: Plan of Care Review  Outcome: Resolved  Goal: Patient-Specific Goal (Individualized)  Outcome: Resolved  Goal: Absence of Hospital-Acquired Illness or Injury  Outcome: Resolved  Intervention: Identify and Manage Fall Risk  Recent Flowsheet Documentation  Taken 02/23/2023 0800 by Hassan Buckler, RN  Safety Interventions:   aspiration precautions   fall reduction program maintained   low bed   nonskid shoes/slippers when out of bed  Intervention: Prevent Skin Injury  Recent Flowsheet Documentation  Taken 02/23/2023 1200 by Hassan Buckler, RN  Positioning for Skin: Supine/Back  Taken 02/23/2023 0800 by Hassan Buckler, RN  Positioning for Skin: Supine/Back  Intervention: Prevent Infection  Recent Flowsheet Documentation  Taken 02/23/2023 0800 by Hassan Buckler, RN  Infection Prevention: rest/sleep promoted  Goal: Optimal Comfort and Wellbeing  Outcome: Resolved  Goal: Readiness for Transition of Care  Outcome: Resolved  Goal: Rounds/Family Conference  Outcome: Resolved     Problem: Skin Injury Risk Increased  Goal: Skin Health and Integrity  Outcome: Resolved  Intervention: Optimize Skin Protection  Recent Flowsheet Documentation  Taken 02/23/2023 1200 by Hassan Buckler, RN  Pressure Reduction Techniques: frequent weight shift encouraged  Taken 02/23/2023 0800 by Hassan Buckler, RN  Pressure Reduction Techniques: frequent weight shift encouraged     Problem: Self-Care Deficit  Goal: Improved Ability to Complete Activities of Daily Living  Outcome: Resolved     Problem: Fall Injury Risk  Goal: Absence of Fall and Fall-Related Injury  Outcome: Resolved  Intervention: Promote Injury-Free Environment  Recent Flowsheet Documentation  Taken 02/23/2023 0800 by Hassan Buckler, RN  Safety Interventions:   aspiration precautions   fall reduction program maintained   low bed   nonskid shoes/slippers when out of bed

## 2023-02-23 NOTE — Unmapped (Signed)
Informaci??n adicional sobre la cita por video con Suzan Nailer, PsyD el 9 de agosto de 2024:  Debe iniciar la sesi??n de My [place] Chart al menos 15 minutos antes de la cita para completar el proceso de eCheck-In (registro electr??nico). Se debe completar eCheck-In antes de comenzar la visita por video. Tambi??n recomendamos que compruebe su conexi??n de audio y video para poder solucionar cualquier problema antes de que comience la visita. Geophysicist/field seismologist Video Visit para comprobar estas cosas. Una vez que complete eCheck-In y compruebe su audio y video, haga clic en Join Call para unirse a Artist.      Para la visita por video, necesita una computadora con c??mara, altavoz y micr??fono en buen funcionamiento o un tel??fono celular o una tableta electr??nica con acceso al internet.     My [place] Chart le permite gestionar su salud, enviar mensajes no urgentes a su proveedor de atenci??n m??dica, ver los resultados de sus pruebas, Charity fundraiser y Database administrator sus citas y pedir recetas renovables de Wellsite geologist segura y conveniente desde su computadora o aparato m??vil.     Visite [email or web address] para iniciar una sesi??n de My [place] Chart con su nombre de usuario y contrase??a. Si se la ha olvidado su nombre de [place] o contrase??a, haga clic en el enlace Forgot Username? (??Olvid?? su nombre de usuario?) o Forgot Password? (??Olvid?? su contrase??a?) para acceder a su cuenta. Tambi??n puede acceder a su cuenta de My [place] Chart utilizando la aplicaci??n m??vil gratuita MyChart para Tour manager.     Si necesita ayuda para acceder a su cuenta de My [place] Chart o para ayuda comunic??ndose con el consultorio de su proveedor para reprogramar o cancelar la cita, llame al [facility] al [phone].  ______________________________________________________________________________________________    Informaci??n adicional sobre la cita por video con Eliezer Bottom, LCSW el 26 de septiembre de 2024:  Su proveedor le enviar?? un enlace a la hora de la visita por video. NO vaya a la cl??nica.      Para la visita por video, necesita una computadora con c??mara, altavoz y micr??fono en buen funcionamiento o un tel??fono celular o una tableta electr??nica con acceso al internet.

## 2023-02-23 NOTE — Unmapped (Signed)
Set up for welcome/onboarding to shared services.

## 2023-02-23 NOTE — Unmapped (Signed)
Abdominal Transplant Inpatient Pharmacy Education Note    Date: 02/23/2023    Patient: Alicia Armstrong    s/p liver transplant  Education provided with Spanish Interpreter    Discharge Education Provided: ?    Provided patient a medication card with list of all discharge medications. Discussed anti-rejection and anti-infective medication regimens in depth. The patient's full medication schedule was discussed in detail with the patient and included drug name, indication, dose, appropriate timing of self-administration, drug monitoring, drug/drug interaction drug/food interaction, herbal/over-the-counter medication use, side effect profile, and generic versus brand formulation.    Adverse effects of tacrolimus discussed: Nephrotoxicity, neurotoxicity including headache, tremor and possible seizures and metabolic disturbances including hypertension, hyperglycemia and hyperlipidemia. The patient verbalized understanding regarding not to take their calcineurin inhibitor prior to lab draws to ensure accurate trough levels.    Adverse effects of mycophenolic acid: Decreased blood counts, infection and most commonly gastrointestinal distress consisting of diarrhea, nausea and abdominal pain.     Adverse effects of prednisone discussed: Hypertension, hyperlipidemia, hyperglycemia, mood swings, insomnia, gastric ulcers.      Stressed the importance of medication adherence to transplant outcomes.    Myfortic Medication Guide will be given at time of discharge.  Provided patient with Mycophenolate REMS Overview and Your Birth Control Options booklet and mycophenolate medication guide. Discussed the following points:  - Known risks to an unborn baby if patient takes mycophenolate while pregnant (risk of birth defects and miscarriage)  - Encouraged patient to wait until 6 weeks after stopping mycophenolate to become pregnant in order to minimize the risk of birth defects and the risk of losing the unborn child  - Acceptable forms of birth control  - To discuss with doctor if thinking about becoming pregnant and to tell doctor right away if become pregnant  - Encouraged to wait at least one to two years after transplant or at the discretion of the provider before becoming pregnant    Educated on avoidance of live vaccines  Educated on contacting outpatient nurse coordinator before starting any herbal supplements and for any medication changes made by providers outside of the transplant team    The patient demonstrated a good understanding of the discharge medications. Son was present for the majority of education. The patient was instructed to bring the card and pill bottles to their first follow up visit. All questions and concerns were answered.    25-60 minutes of discharge education was completed with this patient.  To promote optimum therapeutic outcomes and prevent medication errors, the patient???s medication profile was reviewed daily by pharmacy and discussed with the multidisciplinary transplant team.     Vertis Kelch,  PharmD

## 2023-02-23 NOTE — Unmapped (Addendum)
______________________________________________________________________________________________  Alicia Armstrong   Tipo: con respaldo   Tiempo necesitado: 99 meses     Andador   Tipo: con Alicia Armstrong   Ruedas: 4 ruedas y silla   Tiempo necesitado: 99

## 2023-02-23 NOTE — Unmapped (Addendum)
Discharge Summary    Admit date: 02/14/2023    Discharge date and time: 02/23/2023    Discharge to:  Home    Discharge Service: Surg Transplant [facility])    Discharge Attending Physician: Florene Glen, MD    Discharge  Diagnoses: s/p Orthotopic liver transplant    Secondary Diagnosis: Principal Problem:    Liver transplant recipient (CMS-HCC) (POA: Not Applicable)  Active Problems:    Decompensated hepatic cirrhosis (CMS-HCC) (POA: Yes)    Autoimmune hepatitis (CMS-HCC) (POA: Yes)    Immunocompromised patient (CMS-HCC) (POA: Yes)    Obesity (BMI 30-39.9) (POA: Yes)  Resolved Problems:    * No resolved hospital problems. *      OR Procedures:    Bilateral - LIVER ALLOTRANSPLANTATION; ORTHOTOPIC, PARTIAL OR WHOLE, FROM CADAVER OR LIVING DONOR, ANY AGE  BACKBNCH STD PREP CAD DONOR WHOLE LIVER GFT PRIOR TNSPLNT,INC CHOLE,DISS/REM SURR TISSU WO TRISEG/LOBE SPLT  THROMBEC DIRECT OR W/CATH; VENA CAVA BY LEG INCS  Date  02/14/2023  -------------------     Ancillary Procedures: no procedures    Discharge Day Services:     Subjective   No acute events overnight. Pain Controlled. No fever or chills. Good UOP and PO intake. WBC improving, Cellcept currently at 250 mg. Can increase Cellcept if WBC continues to improve. On PO lasix 20 mg for 3 days to help with ascites.    Objective   Patient Vitals for the past 8 hrs:   BP Temp Temp src Pulse Resp SpO2   02/23/23 0740 109/60 36.8 ??C (98.2 ??F) Oral 90 18 99 %   02/23/23 0705 -- -- -- 94 -- --   02/23/23 0345 123/72 36.8 ??C (98.2 ??F) Oral 83 16 99 %     I/O this shift:  In: 120 [P.O.:120]  Out: -     General Appearance:   No acute distress  Lungs:                Clear to auscultation bilaterally  Heart:                           Regular rate and rhythm  Abdomen:                Soft, non-tender, non-distended  Extremities:              Warm and well perfused    Hospital Course:  Alicia Armstrong is a 47 y.o. female with history of  has a past medical history of Cirrhosis (CMS-HCC) who presents for liver transplant.     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 Patient remained intuabted in the operating room, and she was transferred to SICU.     On 02/15/2023, patient was transferred to the SICU and remained stable. She received one unit of packed RBCs.    On 02/16/2023, the patient was extubated without incident.    Her diet was slowly advanced and at the time of discharge she was tolerating a regular diet. She was able to void spontaneously and post op pain was well-controlled with P.O. pain medication. She was assessed by Physical Therapy and Occupational Therapy and found to be suitable for discharge to home. Lateral JP drain was removed POD#7 with stitches in place. She was discharged home in stable condition with a prescription for oral pain medications and  scheduled to follow up with [place] in 1 weeks.      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 Norma Fredrickson, KDPI 4%, CIT 0, cPRA 0  -post op course c/b DGF? No    Immunosuppression:  -Induction: Thymoglobulin   -Tac at dc: 10mg  BID  -Cellcept at dc: 250mg  BID  -Pred at dc: 20mg     Prophylaxis/ID:  -PJP: Pentamidine monthly x 6 months, last dose 02/20/23  -CMV: CMV (D-/R+ or D+/R+): Moderate Risk, 450mg  Valcyte daily x 3 months, renally dose adjusted  -ASA 81 at dc: no      Condition at Discharge: Improved  -Coordinators: Can increase Cellcept if WBC continues to improve. Discharged with 250 mg BID.   Discharge Medications:      Medication List      START taking these medications     acetaminophen 325 MG tablet; Commonly known as: Tylenol; Take 1-2   tablets (325-650 mg total) by mouth every eight (8) hours as needed for   pain or fever.   aspirin 81 MG tablet; Commonly known as: ECOTRIN; Tome 1 p??ldora por v??a   oral una vez al d??a. (Dosis = 81 mg); (Take 1 tablet (81 mg total) by   mouth daily.)   * blood-glucose meter kit; Use seg??n las instrucciones; (Use as   instructed)   * blood-glucose meter Misc; Use seg??n lo indicado; (Use as directed)   docusate sodium 100 MG capsule; Commonly known as: COLACE; Use seg??n sea   necesario para el estre??imiento. Tome 1 p??ldora por v??a oral dos veces al   d??a. (Dosis = 100 mg); (Take 1 capsule (100 mg total) by mouth two (2)   times a day as needed for constipation.)   furosemide 20 MG tablet; Commonly known as: LASIX; Take 1 tablet (20 mg   total) by mouth daily. Stop on Saturday, 7/6; Start taking on: February 24, 2023   gabapentin 400 MG capsule; Commonly known as: NEURONTIN; Tome 1 p??ldora   por v??a oral tres veces al d??a. (Dosis = 400 mg); (Take 1 capsule (400 mg   total) by mouth Three (3) times a day.)   glucose blood test strip; Generic drug: blood sugar diagnostic; Use to   check blood sugar as directed with insulin 3 times a day & for symptoms of   high or low blood sugar.   insulin lispro 100 unit/mL injection pen; Commonly known as: HumaLOG;   Inject 0-10 Units under the skin Three (3) times a day before meals.   Inject 4 units before meals and correctional insulin of 1 unit for every   50 mg/dL over 161 mg/dL   lancets Misc; Use to check blood sugar as directed with insulin 3 times   a day & for symptoms of high or low blood sugar.   magnesium (amino acid chelate) 133 mg tablet; Generic drug: magnesium   oxide-Mg AA chelate; (Hold until directed to take by nurse coordinator)   Take 1 tablet by mouth two (2) times a day.   methocarbamol 500 MG tablet; Commonly known as: ROBAXIN; Use seg??n sea   necesario. Tome 1 p??ldora por v??a oral tres veces al d??a. (Dosis = 500   mg); (Take 1 tablet (500 mg total) by mouth Three (3) times a  day as   needed.)   mycophenolate 250 mg capsule; Commonly known as: CELLCEPT; Tome 1 p??ldora por v??a oral dos veces al d??a. (Dosis = 250 mg); (Take 1 capsule   (250 mg total) by mouth two (2) times a day.)   oxyCODONE 5 MG immediate release tablet; Commonly known as: ROXICODONE;   Use seg??n sea necesario para el dolor. Tome 1 p??ldora por v??a oral cada 4   horas. (Dosis = 5 mg) Use durante un m??ximo de 5 d??as.; (Take 1 tablet (5   mg total) by mouth every four (4) hours as needed for pain for up to 5   days.)   pen needle, diabetic 32 gauge x 5/32 (4 mm) Ndle; Use with insulin up   to 4 times/day as needed.   pentamidine in sterile water; Inhale 6 mL (300 mg total) every   twenty-eight (28) days.; Start taking on: March 20, 2023   polyethylene glycol 17 gram packet; Commonly known as: MIRALAX; Mix 1   packet in  4 to 8 ounces of liquid and drink daily as needed.   predniSONE 5 MG tablet; Commonly known as: DELTASONE; Tome 1 p??ldora por   v??a oral una vez al d??a. (Dosis = 5 mg); (Take 1 tablet (5 mg total) by   mouth daily.)   SITagliptin phosphate 100 MG tablet; Commonly known as: JANUVIA; Tome 1   p??ldora por v??a oral una vez al d??a. (Dosis = 100 mg); (Take 1 tablet (100   mg total) by mouth daily.)   tacrolimus 1 MG capsule; Commonly known as: PROGRAF; Take 10 capsules   (10 mg total) by mouth two (2) times a day.   valGANciclovir 450 mg tablet; Commonly known as: VALCYTE; Tome 1 p??ldora   por v??a oral una vez al d??a. (Dosis = 450 mg); (Take 1 tablet (450 mg   total) by mouth daily.)  * This list has 2 medication(s) that are the same as other medications   prescribed for you. Read the directions carefully, and ask your doctor or   other care provider to review them with you.     CHANGE how you take these medications     calcium carbonate 200 mg calcium (500 mg) chewable tablet; Commonly   known as: TUMS; Chew 1 tablet (500 mg total) Three (3) times a day.; What   changed: medication strength, when to take this   pantoprazole 40 MG tablet; Commonly known as: Protonix; Tome 1 p??ldora   por v??a oral una vez al d??a. (Dosis = 40 mg); (Take 1 tablet (40 mg total)   by mouth daily.); What changed: medication strength, how much to take     STOP taking these medications     azathioprine 50 mg tablet; Commonly known as: IMURAN   baclofen 10 MG tablet; Commonly known as: LIORESAL   bumetanide 1 MG tablet; Commonly known as: BUMEX   carvedilol 6.25 MG tablet; Commonly known as: COREG   lactulose 10 gram/15 mL solution   ondansetron 4 MG disintegrating tablet; Commonly known as: ZOFRAN-ODT   spironolactone 50 MG tablet; Commonly known as: ALDACTONE       Pending Test Results:     Discharge Instructions:    Diet: Regular    Other Instructions:  - Coordinators: Can increase Cellcept if WBC improves.    Labs and Other Follow-ups after Discharge:  Follow Up instructions and Outpatient Referrals     Ambulatory referral to Home Health  Is this a Personnel officer or [facility] Patient?: No    Physician to follow patient's care: Referring Provider    Disciplines requested:  Physical Therapy  Occupational Therapy       Physical Therapy requested: Evaluate and treat    Occupational Therapy Requested: Home safety evaluation    Requested [facility] Date: 02/26/2023    Do you want ongoing co-management?: Yes    Care coordination required?: No    Ambulatory referral to Occupational Therapy      Ambulatory referral to Physical Therapy        Resources and Referrals       Shower chair      Type: With Back    Length of Need: 99 months    Walker      Type: Rolling    Wheels: 4 Wheels & Seat    Length of Need: 99          Future Appointments:  Appointments which have been scheduled for you      Mar 01, 2023 9:00 AM  (Arrive by 8:30 AM)  LAB ONLY with SURTRA LAB ONLY  [facility] TRANSPLANT SURGERY [place] [facility] REGION) [address]  [place] [address]  [phone]        Mar 01, 2023 10:30 AM  (Arrive by 10:00 AM)  RETURN PHARMD with TRANSPLANT SURGERY PHARMACY  [facility] TRANSPLANT SURGERY [place] [facility] REGION) [address]  [place] [address]  [phone]        Mar 01, 2023 1:00 PM  (Arrive by 12:30 PM)  RETURN 15 with Chirag Lanney Gins, MD  [facility] TRANSPLANT SURGERY [place] [facility] REGION) [address]  [place] [address]  [phone]        Apr 02, 2023 10:00 AM  (Arrive by 9:30 AM)  NEW VIDEO HCP MYCHART with Suzan Nailer, PsyD  [facility] TRANSPLANT SURGERY [place] [facility] REGION) [address]  [place] [address]  [phone]   Please sign into My [place] Chart at least 15 minutes before your appointment to complete the eCheck-In process. You must complete eCheck-In before you can start your video visit. We also recommend testing your audio and video connection to troubleshoot any issues before your visit begins. Click ???Join Video Visit??? to complete these checks. Once you have completed eCheck-In and tested your audio and video, click ???Join Call??? to connect to your visit.     For your video visit, you will need a computer with a working camera, speaker and microphone, a smartphone, or a tablet with internet access.    My [place] Chart enables you to manage your health, send non-urgent messages to your provider, view your test results, schedule and manage appointments, and request prescription refills securely and conveniently from your computer or mobile device.    You can go to [email or web address] to sign in to your My [place] Chart account with your username and password. If you have forgotten your username or password, please choose the ???Forgot Username???? and/or ???Forgot Password???? links to gain access. You also can access your My [place] Chart account with the free MyChart mobile app for Android or iPhone.    If you need assistance accessing your My [place] Chart account or for assistance in reaching your provider's office to reschedule or cancel your appointment, please call [facility] [phone]. May 17, 2023 11:30 AM  (Arrive by 11:00 AM)  RETURN PHARMD with TRANSPLANT SURGERY PHARMACY  [facility] TRANSPLANT SURGERY [place] [facility] REGION) [address]  [place] [address]  [phone]        May 17, 2023 12:45 PM  (Arrive by 12:15 PM)  RETURN 15 with Chirag Lanney Gins, MD  [facility] TRANSPLANT SURGERY [place] [facility] REGION) [address]  [place] [address]  [phone]        May 20, 2023 12:00 PM  (Arrive by 11:30 AM)  RETURN VIDEO HCP DIRECT LINK with Eliezer Bottom, LCSW  [facility] LIVER TRANSPLANT [place])  Arrive at: This is a Video Visit [address]  [place] [address]  [phone]   A direct link will be sent to you by your provider at the time of your video appointment. Please do NOT go to the clinic.     For your video visit, you will need a computer with a working camera, speaker and microphone, a smartphone, or a tablet with internet access.

## 2023-02-23 NOTE — Unmapped (Signed)
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 Toler?? piperacillin/tazobactam desde el 2 de enero de 2018 hasta el 5 de enero de 2018 con picaz??n, sin sarpullido.

## 2023-02-23 NOTE — Unmapped (Signed)
Diabetes education consultation: Consulted for assistance with providing instruction on diabetes self-management skills. Contacted Alicia Armstrong via phone with interpreter at the bedside to discuss diabetes education needs.    Reason for Consult: Blood glucose monitoring, Insulin instruction, New to insulin.  Reviewed consult orders with: Truitt Merle    Assessment: Alicia Armstrong is a 47 y.o. 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 We have been consulted at the request of Chirag Lanney Gins, MD to evaluate Alicia Armstrong for hyperglycemia and transition to subcutaneous insulin from endotool. Alzina Elyzah Heldenbrand has Diabetes. See MAR for medication list.     Insulin administration & safety:  Shared with Magda Bernheim Zeck clinical nurse will review final diabetes discharge plan.      Insurance: Shealee Quanna Schollmeyer has BCBS, [employer], AND [place] [employer] [place], [place]. There are finanical barriers to glucose monitoring. Patient has concerns about cost of blood sugar meter and the supplies, notified doctor and case manager for patient regarding patient concerns of cost for blood sugar meter and supplies via EPIC chat.    Monitoring: Patient reports that she needs a blood glucose meter due to not having one at home.     Glucose Monitoring Supplies needing prescriptions at discharge:  Please order glucose meter covered by insurance    Diabetes Resources Already Tubed and at Patient Bedside: Provided, Plan Your Portion Plate, from the ADA, A1C, Diabetes: Sick Care, Changing Life with Diabetes, Low Blood Sugars/High Blood Sugars and Certified Diabetes Educator Referral, as resources for discharge.     Recommendations: At f/u visit with PCP request referral for DSMES (Diabetes Self Management Education and Support) after discharge.     Plan: Plan to follow patient and provide additional diabetes education while in house.   Thank You,   Norm Salt, MSN, RN, Diabetes Nurse Educator- pager: [phone]

## 2023-02-23 NOTE — Unmapped (Signed)
Diabetes education consultation: Follow up consultation on previous education.  Visit with  Magda Bernheim Filo at the bedside. Provided 90 minutes diabetes education.    Insulin administration & safety: Reviewed and  Discussed timing of administration & described correctional sliding scale & connection to glucose monitoring. Reviewed diabetes discharge plan with patient per doctor via EPIC chat:     Insulin Standard Scale Before Meals and at Bedtime:  Use Insulin Lispro (HumaLOG)  Blood Glucose                                             Treatment     51-70  Give ?? cup juice, recheck in 15 minutes repeat treatment until blood sugar over 70  71-154      0 units    155-184      1 units    285-214      2 units    215-244      3 units    245-274      4 units    275-300      5 units    More than 300 Give 6 units of insulin    Call the Doctor: If blood glucose < 51 or > 300  Shared with Magda Bernheim Roy clinical nurse will review final diabetes discharge plan. Reviewed and Instructed on insulin administration with pens and insulin safety - including injection site selection & rotation, insulin pen storage, sharps disposal & discard/expiration date. Agustina Renelda Mom returned demonstration using demo equipment with slight difficulty but did well, stating Lunden Panda Symonette felt confident with ability.      Problem-solving: Hypoglycemia:    Reviewed and Instructed on hypoglycemia recognition & treatment, including 15-15 rule, listing several examples of appropriate fast carb sources, emphasizing importance of having something readily available.  Described possible causes and prevention.  Instructed to contact provider for unexplained episode, an episode requiring more than 1 treatment, or 2 episodes within a 2-3 day period, for possible dose adjustment.    Hyperglycemia:  Instructed on hyperglycemia recognition and treatment. Described possible causes and prevention and when to contact provider.     Monitoring: Reviewed how to use Accu Chek with patient and patient son. Patient son provided email address [phone] [email or web address] Provided with copies of large print log sheets to maintain a record of readings & instructed to bring to follow-up visits with provider for evaluation of treatment plan.    Risk Reduction- Foot Care:  Reviewed and Discussed principles of good foot care such as daily cleansing, and inspection, avoiding walking barefoot, having well fitting shoes and no bathroom surgery on feet.  Provided education material Diabetes Foot Health: Care Instructions as a resource.     Recommendations: At f/u visit with PCP request referral for DSMES (Diabetes Self Management Education and Support) after discharge.     Plan: Reconsult if necessary.   Thank You,   Norm Salt, MSN, RN, Diabetes Nurse Educator- pager: [phone]

## 2023-02-23 NOTE — Unmapped (Signed)
Education Follow up Assessment (Patient may utilize resources: family, booklet, med list.)   Re-educate on all incorrect responses and reassess those at conclusion of session.     Met with patient and [facility] interpreter, Donald Pore.    1) Who do you call on nights, weekends, and holidays if you are having a transplant issue? The on call coordinator     2) Name 2 reasons to call the on-call coordinator. Temperature and pain over her incision    3) Have you spoken with a pharmacist?  no    4) When do you take your morning medicines on lab draw days? after labs reviewed with patient    5) Can you name one your anti-rejection medicines? no reviewed in the book    6) Can you name one your anti-infectious medicines? no reviewed in the book    7) What are you responsible for monitoring when you are at home? Reminded her  of her daily log    8) How do you prevent rejection? take my medicines on time or equivalent    9) Have you spoken with a dietician about safe food handling?  yes     10) Do you have any questions about safe food handling?  no     11) With the information we have shared with you and the resources that you have access to, do you feel comfortable going home?  yes    Loma Boston, RN Inpatient Abdominal Transplant Nurse Coordinator 02/23/2023 1:24 PM

## 2023-02-23 NOTE — Unmapped (Signed)
Pharmacist Discharge Note for  Liver Transplant Recipient  Date of admission to [facility]: 02/14/2023  Reason for writing this note: new diagnosis with new medication, patient requires medication-related outpatient intervention and/or monitoring    Reason for Admission: s/p liver transplant on 02/15/23 due to AIH  MELD 3.0: 25 at 02/14/2023 10:47 PM   MELD-Na: 23 at 02/14/2023 10:47 PM   DBD, EBV D+/R+     Discharge Date: 02/23/23    Past Medical History:   Diagnosis Date    Cirrhosis (CMS-HCC)        Immunosuppression regimen:  Tacrolimus 9 mg BID (dose as of evening of 02/23/23); goal 8-10 ng/mL    Mycophenolate mofetil 250 mg BID (held on 6/27 due to leukopenia, then restarted at 250 mg BID on 7/1)    Prednisone 5 mg daily starting on 7/3    Antimicrobials during admission:   CMV:  D-/R+   Discharge dose:  Valcyte 450 mg daily  Goal dose: 450 mg daily  End date: 05/18/23  PJP:    Pentamidine x1 on 6/29 (dc'd Bactrim due to leukopenia), could resume Bactrim outpatient if needed; otherwise, will continue pentamidine 300 mg inhalation q28 days  End date: 08/17/23  Anti-fungal:    Nystatin while inpatient    Medication changes to be instituted upon discharge:  Pertinent new medications:  Heme: aspirin 81 mg daily - started on discharge  GI: docusate and Miralax PRN, pantoprazole, furosemide 20 mg daily through 7/8  Pain: gabapentin 400 mg TID, methocarbamol PRN, oxycodone PRN  Lytes: Mag plus protein - on hold, TUMS 1 tablet TID  Endo: Humalog correctional 1:50>150 mg/dL; sitagliptin 952 mg daily    Home medications stopped: azathioprine, baclofen, bumetanide, Os-Cal, carvedilol, lactulose, Zofran, spironolactone    Medication related barriers: none, requires Spanish interpreter for education    Potential adverse effects during admission   YES NO   1 Neurotoxicity (tremor, paresthesias, tingling, seizures, or headache) []  [x]    2 Hypertension []  [x]    3 Hyperglycemia [x]  []        Suggested monitoring for outpatient follow-up: Liver/IS: AST 32, ALT 100, Tbili 1.5 on discharge day. Tac level yesterday therapeutic. Prednisone reduces to 5 mg daily tomorrow. MMF was held and then restarted due to leukopenia at 250 mg BID. Plan to continue to increase MMF as able outpatient.  Endo: Patient requiring insulin inpatient. Endo consulted, and patient will be discharged with correctional insulin and sitagliptin while on prednisone 5 mg daily. Continue to monitor glucoses outpatient and adjust the insulin regimen as able.   Heme: Filgrastim x 1 on 6/28 for ANC 0.8.  Lytes: Mag on hold and TUMS TID started inpatient for hypocalcemia.     Vertis Kelch,  PharmD

## 2023-02-23 NOTE — Unmapped (Signed)
Alert & oriented x4, vital signs stable on room air  Pain managed with scheduled pain medications   Patient ambulates to toilet stand-by assist and is producing adequate urine  LBM 7/01, tolerating a regular diet without complaints of nausea/vomiting  Mercedes staples on abdomen remains attached, open-to-air with no drainage  Patient ambulates stand-by assist, remains without any falls   Falls precautions maintained, bed in low and locked position, siderails up x2, call bell within reach  No questions or concerns at this time    Problem: Wound  Goal: Optimal Coping  Outcome: Progressing  Goal: Optimal Functional Ability  Outcome: Progressing  Intervention: Optimize Functional Ability  Recent Flowsheet Documentation  Taken 02/23/2023 0600 by Cynda Acres, RN  Activity Management: ambulated in room  Taken 02/22/2023 2200 by Cynda Acres, RN  Activity Management: back to bed  Taken 02/22/2023 2000 by Cynda Acres, RN  Activity Management:   up ad lib   ambulated in room  Goal: Absence of Infection Signs and Symptoms  Outcome: Progressing  Intervention: Prevent or Manage Infection  Recent Flowsheet Documentation  Taken 02/22/2023 2000 by Cynda Acres, RN  Infection Management: aseptic technique maintained  Isolation Precautions: protective precautions maintained  Goal: Improved Oral Intake  Outcome: Progressing  Goal: Optimal Pain Control and Function  Outcome: Progressing  Goal: Skin Health and Integrity  Outcome: Progressing  Intervention: Optimize Skin Protection  Recent Flowsheet Documentation  Taken 02/23/2023 0600 by Cynda Acres, RN  Activity Management: ambulated in room  Taken 02/22/2023 2200 by Cynda Acres, RN  Activity Management: back to bed  Taken 02/22/2023 2000 by Cynda Acres, RN  Activity Management:   up ad lib   ambulated in room  Pressure Reduction Techniques:   frequent weight shift encouraged   heels elevated off bed  Head of Bed (HOB) Positioning: HOB at 20-30 degrees  Pressure Reduction Devices: pressure-redistributing mattress utilized  Skin Protection: adhesive use limited  Goal: Optimal Wound Healing  Outcome: Progressing     Problem: Adult Inpatient Plan of Care  Goal: Plan of Care Review  Outcome: Progressing  Goal: Patient-Specific Goal (Individualized)  Outcome: Progressing  Goal: Absence of Hospital-Acquired Illness or Injury  Outcome: Progressing  Intervention: Identify and Manage Fall Risk  Recent Flowsheet Documentation  Taken 02/22/2023 2000 by Cynda Acres, RN  Safety Interventions:   fall reduction program maintained   lighting adjusted for tasks/safety   low bed   nonskid shoes/slippers when out of bed  Intervention: Prevent Skin Injury  Recent Flowsheet Documentation  Taken 02/22/2023 2000 by Cynda Acres, RN  Positioning for Skin: Supine/Back  Device Skin Pressure Protection:   absorbent pad utilized/changed   adhesive use limited  Skin Protection: adhesive use limited  Intervention: Prevent and Manage VTE (Venous Thromboembolism) Risk  Recent Flowsheet Documentation  Taken 02/23/2023 0600 by Cynda Acres, RN  Anti-Embolism Device Type: SCD, Knee  Anti-Embolism Intervention: Refused  Anti-Embolism Device Location: BLE  Taken 02/23/2023 0400 by Cynda Acres, RN  Anti-Embolism Device Type: SCD, Knee  Anti-Embolism Intervention: Refused  Anti-Embolism Device Location: BLE  Taken 02/23/2023 0200 by Cynda Acres, RN  Anti-Embolism Device Type: SCD, Knee  Anti-Embolism Intervention: Refused  Anti-Embolism Device Location: BLE  Taken 02/23/2023 0000 by Cynda Acres, RN  Anti-Embolism Device Type: SCD, Knee  Anti-Embolism Intervention: Refused  Anti-Embolism Device Location: BLE  Taken 02/22/2023 2200 by Cynda Acres, RN  Anti-Embolism Device Type: SCD, Knee  Anti-Embolism  Intervention: Refused  Anti-Embolism Device Location: BLE  Taken 02/22/2023 2058 by Cynda Acres, RN  VTE Prevention/Management: ambulation promoted  Anti-Embolism Device Type: SCD, Knee  Anti-Embolism Intervention: Refused  Anti-Embolism Device Location: BLE  Taken 02/22/2023 2000 by Cynda Acres, RN  Anti-Embolism Device Type: SCD, Knee  Anti-Embolism Intervention: Refused  Anti-Embolism Device Location: BLE  Intervention: Prevent Infection  Recent Flowsheet Documentation  Taken 02/22/2023 2000 by Cynda Acres, RN  Infection Prevention:   cohorting utilized   environmental surveillance performed   equipment surfaces disinfected   hand hygiene promoted   personal protective equipment utilized   rest/sleep promoted   single patient room provided  Goal: Optimal Comfort and Wellbeing  Outcome: Progressing  Goal: Readiness for Transition of Care  Outcome: Progressing  Goal: Rounds/Family Conference  Outcome: Progressing     Problem: Skin Injury Risk Increased  Goal: Skin Health and Integrity  Outcome: Progressing  Intervention: Optimize Skin Protection  Recent Flowsheet Documentation  Taken 02/23/2023 0600 by Cynda Acres, RN  Activity Management: ambulated in room  Taken 02/22/2023 2200 by Cynda Acres, RN  Activity Management: back to bed  Taken 02/22/2023 2000 by Cynda Acres, RN  Activity Management:   up ad lib   ambulated in room  Pressure Reduction Techniques:   frequent weight shift encouraged   heels elevated off bed  Head of Bed (HOB) Positioning: HOB at 20-30 degrees  Pressure Reduction Devices: pressure-redistributing mattress utilized  Skin Protection: adhesive use limited     Problem: Self-Care Deficit  Goal: Improved Ability to Complete Activities of Daily Living  Outcome: Progressing  Intervention: Promote Activity and Functional Independence  Recent Flowsheet Documentation  Taken 02/22/2023 2058 by Cynda Acres, RN  Self-Care Promotion: independence encouraged     Problem: Fall Injury Risk  Goal: Absence of Fall and Fall-Related Injury  Outcome: Progressing  Intervention: Identify and Manage Contributors  Recent Flowsheet Documentation  Taken 02/22/2023 2058 by Cynda Acres, RN  Self-Care Promotion: independence encouraged  Intervention: Promote Injury-Free Environment  Recent Flowsheet Documentation  Taken 02/22/2023 2000 by Cynda Acres, RN  Safety Interventions:   fall reduction program maintained   lighting adjusted for tasks/safety   low bed   nonskid shoes/slippers when out of bed

## 2023-02-23 NOTE — Unmapped (Signed)
Endocrine Team Follow Up Consult Note     Consult information:  Requesting Attending Physician : Florene Glen, MD  Service Requesting Consult : Surg Transplant [phone])  Primary Care Provider: Doris Cheadle, MD  Impression:  Alicia Armstrong is a 47 y.o. 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 We have been consulted at the request of Alicia Lanney Gins, MD to evaluate Khalie for hyperglycemia and transition to subcutaneous insulin from endotool.    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  Steroid induced hyperglycemia.    Studies reviewed 02/23/23:  Labs: POCT-BG  Interpretation:Intermittent hyperglycemia. 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     Patient noted to have one BG above goal yesterday evening which is likely postprandial rise from lunch. BG did improve with correction. Steroid dose further reduced by 1/3rd, will plan to reduce basal insulin by 20% and nutritional insulin by 25% today.    Recommendations:  -NPH 8u in AM  -Decrease lispro 6 units with meals  -Lispro correction factor of 30 ACHS  -Accu-Cheks ACHS  -Hypoglycemic protocol initiated    Discharge recommendations  For Discharge, would recommend dpp4i (sitagliptin 100mg  every day) in addition to sliding scale insulin while patient is on prednisone 5mg  daily. If patient continues to have hyperglycemia (Bgs >180), would consider adding on nutritional insulin in AM only. Discharge plan has been discussed with transplant team  -High risk for developing post transplant related diabetes  - Patient can follow up with transplant pharmacy team or PCP outpatient for further management of DM      Thank you for this consult. Discussed plan with primary team. We will continue to follow and make recommendations and place orders as appropriate.    Please page with questions or concerns: Endocrine fellow on call: [phone].    Subjective:  Interval history  Patient states she is doing well this morning. Does report eating about 25-50% of her breakfast this AM. She is looking forward to being discharged to home today    Initial HPI:  Alicia Armstrong is a 47 y.o. 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 We have been consulted at the request of Alicia Lanney Gins, MD to evaluate [name] for likely steroid induced hyperglycemia with high dose steroids required for induction with liver transplant. She is now transitioning off of endotool to subcutaneous insulin. Today pt reports feeling tired but good. She is on a clear liquid diet and is hungry for the vegetable soup and jello she ordered. No nausea.    Endocrine History:  Pt reports no hx of diabetes and A1C on chart review has been <=5.0    In March of this year patient receive stress dose steroids for acute illness and and a similar elevation in her blood glucose (203-295) she received one days worth of insulin (5U NPH BID x2 and a total of 16U lispro correctional) and her hyperglycemia quickly resolved without further insulin requirements.    PT's son (26yo) has diabetes which is managed with insulin. Her father passed away from presumed complications of diabetes.  Current Nutrition:  Active Orders   Diet    Nutrition Therapy Consistent Carb; Consistent Carb 60/60/60 (4/4/4)         ROS: As per HPI.     albumin human 25 %  25 g Intravenous Daily    albuterol  2.5 mg Nebulization Q28 Days    And    pentamidine (PENTAM) in sterile water nebulizer solution  300 mg Inhalation Q28 Days    bisacodyl  10 mg Rectal Daily    calcium carbonate  400 mg elem calcium Oral TID    furosemide  20 mg Oral Daily    gabapentin  400 mg Oral TID    insulin lispro  0-20 Units Subcutaneous ACHS    insulin lispro  6 Units Subcutaneous 3xd Meals    insulin NPH  8 Units Subcutaneous daily    lidocaine  3 patch Transdermal Daily    methocarbamol  500 mg Oral TID    mycophenolate  250 mg Oral BID    nystatin  10 mL Oral Q8H SCH    pantoprazole  40 mg Oral Daily    polyethylene glycol  17 g Oral Daily    [START ON 02/24/2023] predniSONE  5 mg Oral Daily    senna  2 tablet Oral Nightly    tacrolimus  10 mg Oral BID    valGANciclovir  450 mg Oral Daily       Current Outpatient Medications   Medication Instructions    acetaminophen (TYLENOL) 325-650 mg, Oral, Every 8 hours PRN    aspirin (ECOTRIN) 81 mg, Oral, Daily (standard)    azathioprine (IMURAN) 50 mg, Oral, Daily (standard)    baclofen (LIORESAL) 10 mg, Oral, Daily PRN    blood sugar diagnostic (GLUCOSE BLOOD) Strp Use to check blood sugar as directed with insulin 3 times a day & for symptoms of high or low blood sugar.    blood-glucose meter kit Use as instructed    blood-glucose meter Misc Use as directed    bumetanide (BUMEX) 1 mg, Oral, Daily (standard)    calcium carbonate (OS-CAL) 500 mg calcium (1,250 mg) chewable tablet 500 mg elem calcium, Oral, 2 times a day (standard)    calcium carbonate (TUMS) 500 mg, Oral, 3 times a day (standard)    carvedilol (COREG) 6.25 mg, Oral, At bedtime    docusate sodium (COLACE) 100 mg, Oral, 2 times a day PRN    [START ON 02/24/2023] furosemide (LASIX) 20 mg, Oral, Daily (standard), Stop on Monday, 7/8    gabapentin (NEURONTIN) 400 mg, Oral, 3 times a day (standard)    insulin lispro (HUMALOG) 0-10 Units, Subcutaneous, 3 times a day (AC), Inject 1 unit for every 50 mg/dL over 956 mg/dL before meals    lactulose 20 g, Oral, 2 times a day (standard), Titrate to 3-5 bowel movements per day. If you are having <3 bowel movements per day please increase to three times a day, and if >5 OK to reduce to daily.    lancets Misc Use to check blood sugar as directed with insulin 3 times a day & for symptoms of high or low blood sugar.    magnesium oxide-Mg AA chelate (MAGNESIUM, AMINO ACID CHELATE,) 133 mg (Hold until directed to take by nurse coordinator)  Take 1 tablet by mouth two (2) times a day.    methocarbamol (ROBAXIN) 500 mg, Oral, 3 times a day PRN    mycophenolate (CELLCEPT) 250 mg, Oral, 2 times a day (standard)  ondansetron (ZOFRAN-ODT) 4 mg, orally disintegrating tablet, Every 8 hours PRN    oxyCODONE (ROXICODONE) 5 MG immediate release tablet Take 1 tablet (5 mg total) by mouth every four (4) hours as needed for pain    pantoprazole (PROTONIX) 20 mg, Oral, Daily (standard)    pantoprazole (PROTONIX) 40 mg, Oral, Daily (standard)    pen needle, diabetic 32 gauge x 5/32 (4 mm) Ndle Use with insulin up to 4 times/day as needed.    [START ON 03/20/2023] pentamidine in sterile water 300 mg, Inhalation, Every 28 days    polyethylene glycol (MIRALAX) 17 gram packet Mix 1 packet in  4 to 8 ounces of liquid and drink daily as needed.    predniSONE (DELTASONE) 5 mg, Oral, Daily (standard)    SITagliptin phosphate (JANUVIA) 100 mg, Oral, Daily (standard)    spironolactone (ALDACTONE) 150 mg, Oral, Daily (standard)    tacrolimus (PROGRAF) 9 mg, Oral, 2 times a day    valGANciclovir (VALCYTE) 450 mg, Oral, Daily (standard)           Past Medical History:   Diagnosis Date    Cirrhosis (CMS-HCC)        Past Surgical History:   Procedure Laterality Date    CESAREAN SECTION      PR COLSC FLX W/RMVL OF TUMOR POLYP LESION SNARE TQ N/A 09/11/2022    Procedure: COLONOSCOPY FLEX; W/REMOV TUMOR/LES BY SNARE;  Surgeon: Mickie Hillier, MD;  [facility];  Service: Gastroenterology    PR REMV VEIN CLOT CAVA-ILIAC,LEG INCIS N/A 02/14/2023    Procedure: THROMBEC DIRECT OR W/CATH; VENA CAVA BY LEG INCS;  Surgeon: Florene Glen, MD;  Location: MAIN OR [place];  Service: Transplant    PR TRANSPLANT LIVER,ALLOTRANSPLANT Bilateral 02/14/2023    Procedure: LIVER ALLOTRANSPLANTATION; ORTHOTOPIC, PARTIAL OR WHOLE, FROM CADAVER OR LIVING DONOR, ANY AGE;  Surgeon: Florene Glen, MD;  Location: MAIN OR [place];  Service: Transplant    PR TRANSPLANT,PREP DONOR LIVER, WHOLE N/A 02/14/2023    Procedure: [facility] STD PREP CAD DONOR WHOLE LIVER GFT PRIOR TNSPLNT,INC CHOLE,DISS/REM SURR TISSU WO TRISEG/LOBE SPLT;  Surgeon: Florene Glen, MD;  Location: MAIN OR [place];  Service: Transplant    PR UPPER GI ENDOSCOPY,DIAGNOSIS N/A 04/02/2017    Procedure: UGI ENDO, INCLUDE ESOPHAGUS, STOMACH, & DUODENUM &/OR JEJUNUM; DX W/WO COLLECTION SPECIMN, BY BRUSH OR WASH;  Surgeon: Janyth Pupa, MD;  [facility];  Service: Gastroenterology    PR UPPER GI ENDOSCOPY,DIAGNOSIS N/A 09/11/2022    Procedure: UGI ENDO, INCLUDE ESOPHAGUS, STOMACH, & DUODENUM &/OR JEJUNUM; DX W/WO COLLECTION SPECIMN, BY BRUSH OR WASH;  Surgeon: Mickie Hillier, MD;  [facility];  Service: Gastroenterology       Family History   Problem Relation Age of Onset    No Known Problems Mother     Diabetes Father     No Known Problems Sister     No Known Problems Daughter     No Known Problems Maternal Grandmother     No Known Problems Maternal Grandfather     No Known Problems Paternal Grandmother     No Known Problems Paternal Grandfather     BRCA 1/2 Neg Hx     Breast cancer Neg Hx     Cancer Neg Hx     Colon cancer Neg Hx     Endometrial cancer Neg Hx     Ovarian cancer Neg Hx  Social History     Tobacco Use    Smoking status: Never    Smokeless tobacco: Never   Vaping Use    Vaping status: Never Used   Substance Use Topics    Alcohol use: No     Alcohol/week: 0.0 standard drinks of alcohol    Drug use: No       OBJECTIVE:  BP 109/65  - Pulse 85  - Temp 37.1 ??C (98.8 ??F) (Oral)  - Resp 18  - Ht 154.9 cm (5' 0.98)  - Wt 75.6 kg (166 lb 10.7 oz)  - LMP 11/02/2022 (Approximate)  - SpO2 100%  - BMI 31.51 kg/m??   Wt Readings from Last 12 Encounters:   02/21/23 75.6 kg (166 lb 10.7 oz)   02/06/23 77.9 kg (171 lb 11.2 oz)   02/02/23 86.3 kg (190 lb 3.2 oz)   01/15/23 76.2 kg (168 lb)   12/29/22 73.4 kg (161 lb 13.1 oz)   12/16/22 74.3 kg (163 lb 11.2 oz)   11/27/22 74.9 kg (165 lb 3.2 oz)   11/20/22 76.3 kg (168 lb 3.2 oz)   11/20/22 76.3 kg (168 lb 3.2 oz)   11/01/22 74.8 kg (164 lb 15.9 oz)   09/22/22 71.5 kg (157 lb 9.6 oz)   09/22/22 71.5 kg (157 lb 9.6 oz)     Physical Exam  Constitutional:       Appearance: Normal appearance.   HENT:      Head: Normocephalic.   Pulmonary:      Effort: Pulmonary effort is normal. No respiratory distress.   Skin:     General: Skin is dry.   Neurological:      General: No focal deficit present.      Mental Status: She is alert and oriented to person, place, and time.   Psychiatric:         Mood and Affect: Mood normal.         Behavior: Behavior normal.         Summary of labs:  Lab Results   Component Value Date    A1C 4.6 (L) 02/14/2023    A1C 4.7 (L) 12/29/2022    A1C 5.0 11/11/2016     Lab Results   Component Value Date    CREATININE 0.70 02/23/2023     Lab Results   Component Value Date    WBC 2.8 (L) 02/23/2023    HGB 9.4 (L) 02/23/2023    HCT 27.5 (L) 02/23/2023    PLT 53 (L) 02/23/2023       Lab Results   Component Value Date    NA 139 02/23/2023    K 4.8 02/23/2023    CL 108 (H) 02/23/2023    CO2 28.0 02/23/2023    BUN 20 02/23/2023    CREATININE 0.70 02/23/2023    GLU 108 02/23/2023    CALCIUM 8.4 (L) 02/23/2023    MG 1.9 02/23/2023    PHOS 4.8 02/23/2023       Lab Results   Component Value Date    BILITOT 1.5 (H) 02/23/2023    BILIDIR 0.80 (H) 02/23/2023    PROT 4.7 (L) 02/23/2023    ALBUMIN 3.1 (L) 02/23/2023    ALT 100 (H) 02/23/2023    AST 32 02/23/2023    ALKPHOS 71 02/23/2023    GGT 131 (H) 02/23/2023       Lab Results   Component Value Date    LABPROT 19.4 (H) 02/10/2023    INR  1.06 02/23/2023    APTT 25.3 02/23/2023

## 2023-02-24 DIAGNOSIS — Z7189 Other specified counseling: Principal | ICD-10-CM

## 2023-02-24 LAB — DECEASED DONOR CL I&II, LOW RES
DONOR LOW RES DRW #1: 52
DONOR LOW RES HLA A #1: 26
DONOR LOW RES HLA A #2: 29
DONOR LOW RES HLA B #1: 44
DONOR LOW RES HLA B #2: 51
DONOR LOW RES HLA BW #1: 4
DONOR LOW RES HLA BW #2: 4
DONOR LOW RES HLA C #1: 14
DONOR LOW RES HLA C #2: 16
DONOR LOW RES HLA DQ #1: 2
DONOR LOW RES HLA DQ #2: 4
DONOR LOW RES HLA DR #1: 17
DONOR LOW RES HLA DR #2: 8

## 2023-02-24 MED ORDER — FUROSEMIDE 20 MG TABLET
ORAL_TABLET | Freq: Every day | ORAL | 0 refills | 30 days | Status: CP
Start: 2023-02-24 — End: 2023-03-26
  Filled 2023-02-23: qty 30, 30d supply, fill #0

## 2023-02-25 DIAGNOSIS — Z944 Liver transplant status: Principal | ICD-10-CM

## 2023-02-25 NOTE — Unmapped (Signed)
Contacted [employer] Marcelino Duster [phone]) for assistance with Spanish translation for post-transplant discharge call to patient. Contacted patient and spent >19min in conversation. Patient reportedly doing well today, stating she slept well and had just awakened from a nap. She denies n/v/d/fever/chills or dysuria, but does endorse having tremors, but no HA, and occasional dizziness on rising. Discussed the importance of rising slowly, placing feet on floor for several seconds then standing upright for a few seconds before walking. She reports only recording one BP at 124/79 with home wt at 165.8 lb. Encouraged her to record all VS, including BG, and fluid intake. She states she is eating well and drinks a little juice and water when she eats food or soup. She inquired why her glucose was 215. Explained that she is now diabetic and juices are mostly sugar, that if she must drink them, then she should dilute with mostly water. She plans to obtain labs on Fri, since tomorrow is July 4th. Explained the importance/method for obtaining a reliable tac level, since her dosing depends on it, and the proper dosage will help reduce SE. She mentioned having swelling to her LE, which she felt was managed well with the SCDs used while she was inpatient. She took her furosemide today as prescribed daily. Encouraged her to keep her feet elevated when she is at rest and that activity and a high protein diet will eventually help with that, but to contact the txp team if she develops sob. Ms. Solari denies having any pain. She reports that her incision is intact, without drainage and with some improvement in the redness above the LUQ staples. Patient stated she has a shipment of Ensure coming tomorrow, but let her know she could also use her premier protein shakes. Suggested she might benefit from a nutrition appt, which she agreed to, so request forwarded to front desk for scheduling. Instructed patient to bring pill box, medications, and all VS logs to appt on Monday and to get labs drawn before taking her Tacrolimus. Discussed infection prophy with use of mask in crowds and avoiding diaper changing with children in her household. She inquired if she could come to the hospital early Monday morning, since her son starts work at [employer]. Let her know that if she could wait in an uncrowded area where she could rest that would be acceptable, but she should wear her mask at the hospital. When asked, she said her caregiver would not be attending her appt with her. Emphasized the importance of the caregiver being at the early clinic appts, but if they were not attending, then they should at least be available by video. Confirmed she had [facility]'s contact information and understanding of how to reach on-call after hrs. She confirmed all questions asked and she verbalized understanding of all discussed.

## 2023-02-26 NOTE — Unmapped (Signed)
Patient's daughter paged on-call [facility]. [employer], [place] [phone]) and returned call. Daughter in English reports that the patient was c/o abdominal pain after eating dinner with watermelon. Daughter gave her oxycodone to help relieve the pain. Patient joined conversation in [place] and reported that the pain is in her lower abdomen below her incision. She denies any nausea/diarrhea, reporting she had a normal BM today, but she has been eructating and has heartburn. Daughter confirmed that patient is taking pantoprazole daily. Suggested that this might be gas. Since patient has 125mg  simethicone on hand, suggested she take two to see if she notices any improvement and to avoid oxycodone, since this may only slow GI motility. Encouraged daughter to contact [facility] if patient develops fever, n/v/d or increasing abdominal pain. She verbalized understanding.

## 2023-02-27 LAB — COMPREHENSIVE METABOLIC PANEL
ALBUMIN: 3.6 g/dL — ABNORMAL LOW (ref 3.9–4.9)
ALBUMIN: 3.3 g/dL — ABNORMAL LOW (ref 3.4–5.0)
ALKALINE PHOSPHATASE: 72 IU/L (ref 44–121)
ALKALINE PHOSPHATASE: 77 U/L (ref 46–116)
ALT (SGPT): 46 IU/L — ABNORMAL HIGH (ref 0–32)
ALT (SGPT): 41 U/L (ref 10–49)
ANION GAP: 3 mmol/L — ABNORMAL LOW (ref 5–14)
AST (SGOT): 19 IU/L (ref 0–40)
AST (SGOT): 22 U/L (ref <=34)
BILIRUBIN TOTAL (MG/DL) IN SER/PLAS: 1.3 mg/dL — ABNORMAL HIGH (ref 0.0–1.2)
BILIRUBIN TOTAL: 1.4 mg/dL — ABNORMAL HIGH (ref 0.3–1.2)
BLOOD UREA NITROGEN: 26 mg/dL — ABNORMAL HIGH (ref 6–24)
BLOOD UREA NITROGEN: 24 mg/dL — ABNORMAL HIGH (ref 9–23)
BUN / CREAT RATIO: 24 — ABNORMAL HIGH (ref 9–23)
BUN / CREAT RATIO: 23
CALCIUM: 9 mg/dL (ref 8.7–10.2)
CALCIUM: 8.6 mg/dL — ABNORMAL LOW (ref 8.7–10.4)
CHLORIDE: 101 mmol/L (ref 96–106)
CHLORIDE: 100 mmol/L (ref 98–107)
CO2: 25 mmol/L (ref 20–29)
CO2: 28 mmol/L (ref 20.0–31.0)
CREATININE: 1.09 mg/dL — ABNORMAL HIGH (ref 0.57–1.00)
CREATININE: 1.05 mg/dL — ABNORMAL HIGH
EGFR CKD-EPI (2021) FEMALE: 66 mL/min/{1.73_m2} (ref >=60)
EGFR: 63 mL/min/{1.73_m2}
GLOBULIN, TOTAL: 1.4 g/dL — ABNORMAL LOW (ref 1.5–4.5)
GLUCOSE RANDOM: 129 mg/dL (ref 70–179)
GLUCOSE: 127 mg/dL — ABNORMAL HIGH (ref 70–99)
POTASSIUM: 5.4 mmol/L — ABNORMAL HIGH (ref 3.5–5.2)
POTASSIUM: 4.5 mmol/L (ref 3.4–4.8)
PROTEIN TOTAL: 5.4 g/dL — ABNORMAL LOW (ref 5.7–8.2)
SODIUM: 139 mmol/L (ref 134–144)
SODIUM: 131 mmol/L — ABNORMAL LOW (ref 135–145)
TOTAL PROTEIN: 5 g/dL — ABNORMAL LOW (ref 6.0–8.5)

## 2023-02-27 LAB — CBC W/ DIFFERENTIAL
BANDED NEUTROPHILS ABSOLUTE COUNT: 0 10*3/uL (ref 0.0–0.1)
BASOPHILS ABSOLUTE COUNT: 0 10*3/uL (ref 0.0–0.2)
BASOPHILS RELATIVE PERCENT: 1 %
EOSINOPHILS ABSOLUTE COUNT: 0.2 10*3/uL (ref 0.0–0.4)
EOSINOPHILS RELATIVE PERCENT: 7 %
HEMATOCRIT: 28.8 % — ABNORMAL LOW (ref 34.0–46.6)
HEMOGLOBIN: 9.1 g/dL — ABNORMAL LOW (ref 11.1–15.9)
IMMATURE GRANULOCYTES: 1 %
LYMPHOCYTES ABSOLUTE COUNT: 0.4 10*3/uL — ABNORMAL LOW (ref 0.7–3.1)
LYMPHOCYTES RELATIVE PERCENT: 16 %
MEAN CORPUSCULAR HEMOGLOBIN CONC: 31.6 g/dL (ref 31.5–35.7)
MEAN CORPUSCULAR HEMOGLOBIN: 31.1 pg (ref 26.6–33.0)
MEAN CORPUSCULAR VOLUME: 98 fL — ABNORMAL HIGH (ref 79–97)
MONOCYTES ABSOLUTE COUNT: 0.3 10*3/uL (ref 0.1–0.9)
MONOCYTES RELATIVE PERCENT: 12 %
NEUTROPHILS ABSOLUTE COUNT: 1.5 10*3/uL (ref 1.4–7.0)
NEUTROPHILS RELATIVE PERCENT: 63 %
PLATELET COUNT: 97 10*3/uL — CL (ref 150–450)
RED BLOOD CELL COUNT: 2.93 x10E6/uL — ABNORMAL LOW (ref 3.77–5.28)
RED CELL DISTRIBUTION WIDTH: 17.1 % — ABNORMAL HIGH (ref 11.7–15.4)
WHITE BLOOD CELL COUNT: 2.3 10*3/uL — CL (ref 3.4–10.8)

## 2023-02-27 LAB — CBC W/ AUTO DIFF
BASOPHILS ABSOLUTE COUNT: 0 10*9/L (ref 0.0–0.1)
BASOPHILS RELATIVE PERCENT: 1.1 %
EOSINOPHILS ABSOLUTE COUNT: 0.1 10*9/L (ref 0.0–0.5)
EOSINOPHILS RELATIVE PERCENT: 3.7 %
HEMATOCRIT: 27.4 % — ABNORMAL LOW (ref 34.0–44.0)
HEMOGLOBIN: 9.4 g/dL — ABNORMAL LOW (ref 11.3–14.9)
LYMPHOCYTES ABSOLUTE COUNT: 0.4 10*9/L — ABNORMAL LOW (ref 1.1–3.6)
LYMPHOCYTES RELATIVE PERCENT: 12.6 %
MEAN CORPUSCULAR HEMOGLOBIN CONC: 34.4 g/dL (ref 32.0–36.0)
MEAN CORPUSCULAR HEMOGLOBIN: 32.1 pg (ref 25.9–32.4)
MEAN CORPUSCULAR VOLUME: 93.5 fL (ref 77.6–95.7)
MEAN PLATELET VOLUME: 7.8 fL (ref 6.8–10.7)
MONOCYTES ABSOLUTE COUNT: 0.2 10*9/L — ABNORMAL LOW (ref 0.3–0.8)
MONOCYTES RELATIVE PERCENT: 7.7 %
NEUTROPHILS ABSOLUTE COUNT: 2.2 10*9/L (ref 1.8–7.8)
NEUTROPHILS RELATIVE PERCENT: 74.9 %
PLATELET COUNT: 103 10*9/L — ABNORMAL LOW (ref 150–450)
RED BLOOD CELL COUNT: 2.93 10*12/L — ABNORMAL LOW (ref 3.95–5.13)
RED CELL DISTRIBUTION WIDTH: 18.7 % — ABNORMAL HIGH (ref 12.2–15.2)
WBC ADJUSTED: 2.9 10*9/L — ABNORMAL LOW (ref 3.6–11.2)

## 2023-02-27 LAB — PHOSPHORUS: PHOSPHORUS, SERUM: 5.2 mg/dL — ABNORMAL HIGH (ref 3.0–4.3)

## 2023-02-27 LAB — MAGNESIUM: MAGNESIUM: 1.8 mg/dL (ref 1.6–2.3)

## 2023-02-27 LAB — GAMMA GT: GAMMA GLUTAMYL TRANSFERASE: 84 IU/L — ABNORMAL HIGH (ref 0–60)

## 2023-02-27 LAB — PROTIME-INR
INR: 1.1
PROTIME: 12.3 s (ref 9.9–12.6)

## 2023-02-27 LAB — BILIRUBIN, DIRECT: BILIRUBIN DIRECT: 0.44 mg/dL — ABNORMAL HIGH (ref 0.00–0.40)

## 2023-02-28 ENCOUNTER — Ambulatory Visit
Admit: 2023-02-28 | Discharge: 2023-03-02 | Disposition: A | Discharge status: Home / Self Care | Payer: PRIVATE HEALTH INSURANCE | Admitting: Student in an Organized Health Care Education/Training Program

## 2023-02-28 LAB — CBC W/ AUTO DIFF
BASOPHILS ABSOLUTE COUNT: 0.1 10*9/L (ref 0.0–0.1)
BASOPHILS RELATIVE PERCENT: 2.6 %
EOSINOPHILS ABSOLUTE COUNT: 0.1 10*9/L (ref 0.0–0.5)
EOSINOPHILS RELATIVE PERCENT: 4.9 %
HEMATOCRIT: 22.5 % — ABNORMAL LOW (ref 34.0–44.0)
HEMOGLOBIN: 7.8 g/dL — ABNORMAL LOW (ref 11.3–14.9)
LYMPHOCYTES ABSOLUTE COUNT: 0.3 10*9/L — ABNORMAL LOW (ref 1.1–3.6)
LYMPHOCYTES RELATIVE PERCENT: 13.6 %
MEAN CORPUSCULAR HEMOGLOBIN CONC: 34.5 g/dL (ref 32.0–36.0)
MEAN CORPUSCULAR HEMOGLOBIN: 32 pg (ref 25.9–32.4)
MEAN CORPUSCULAR VOLUME: 92.8 fL (ref 77.6–95.7)
MEAN PLATELET VOLUME: 7.7 fL (ref 6.8–10.7)
MONOCYTES ABSOLUTE COUNT: 0.2 10*9/L — ABNORMAL LOW (ref 0.3–0.8)
MONOCYTES RELATIVE PERCENT: 7.4 %
NEUTROPHILS ABSOLUTE COUNT: 1.6 10*9/L — ABNORMAL LOW (ref 1.8–7.8)
NEUTROPHILS RELATIVE PERCENT: 71.5 %
PLATELET COUNT: 91 10*9/L — ABNORMAL LOW (ref 150–450)
RED BLOOD CELL COUNT: 2.42 10*12/L — ABNORMAL LOW (ref 3.95–5.13)
RED CELL DISTRIBUTION WIDTH: 18.6 % — ABNORMAL HIGH (ref 12.2–15.2)
WBC ADJUSTED: 2.2 10*9/L — ABNORMAL LOW (ref 3.6–11.2)

## 2023-02-28 LAB — LIPASE: LIPASE: 68 U/L — ABNORMAL HIGH (ref 12–53)

## 2023-02-28 LAB — BASIC METABOLIC PANEL
ANION GAP: 2 mmol/L — ABNORMAL LOW (ref 5–14)
BLOOD UREA NITROGEN: 22 mg/dL (ref 9–23)
BUN / CREAT RATIO: 23
CALCIUM: 7.9 mg/dL — ABNORMAL LOW (ref 8.7–10.4)
CHLORIDE: 105 mmol/L (ref 98–107)
CO2: 28 mmol/L (ref 20.0–31.0)
CREATININE: 0.94 mg/dL
EGFR CKD-EPI (2021) FEMALE: 76 mL/min/{1.73_m2} (ref >=60)
GLUCOSE RANDOM: 118 mg/dL (ref 70–179)
POTASSIUM: 4.5 mmol/L (ref 3.4–4.8)
SODIUM: 135 mmol/L (ref 135–145)

## 2023-02-28 LAB — URINALYSIS WITH MICROSCOPY WITH CULTURE REFLEX PERFORMABLE
BACTERIA: NONE SEEN /HPF
BILIRUBIN UA: NEGATIVE
BLOOD UA: NEGATIVE
GLUCOSE UA: NEGATIVE
KETONES UA: NEGATIVE
LEUKOCYTE ESTERASE UA: NEGATIVE
NITRITE UA: NEGATIVE
PH UA: 5 (ref 5.0–9.0)
PROTEIN UA: NEGATIVE
RBC UA: <1 /HPF (ref <=4)
SPECIFIC GRAVITY UA: 1.008 (ref 1.003–1.030)
SQUAMOUS EPITHELIAL: 3 /HPF (ref 0–5)
UROBILINOGEN UA: <2
WBC UA: 1 /HPF (ref 0–5)

## 2023-02-28 LAB — HEPATIC FUNCTION PANEL
ALBUMIN: 2.8 g/dL — ABNORMAL LOW (ref 3.4–5.0)
ALKALINE PHOSPHATASE: 122 U/L — ABNORMAL HIGH (ref 46–116)
ALT (SGPT): 42 U/L (ref 10–49)
AST (SGOT): 33 U/L (ref <=34)
BILIRUBIN DIRECT: 0.9 mg/dL — ABNORMAL HIGH (ref 0.00–0.30)
BILIRUBIN TOTAL: 1.6 mg/dL — ABNORMAL HIGH (ref 0.3–1.2)
PROTEIN TOTAL: 4.8 g/dL — ABNORMAL LOW (ref 5.7–8.2)

## 2023-02-28 LAB — PHOSPHORUS: PHOSPHORUS: 4.8 mg/dL (ref 2.4–5.1)

## 2023-02-28 LAB — HCG QUANTITATIVE, BLOOD: GONADOTROPIN, CHORIONIC (HCG) QUANT: <2.6 m[IU]/mL

## 2023-02-28 LAB — LACTATE, VENOUS, WHOLE BLOOD: LACTATE BLOOD VENOUS: 0.6 mmol/L (ref 0.5–1.8)

## 2023-02-28 LAB — MAGNESIUM: MAGNESIUM: 1.5 mg/dL — ABNORMAL LOW (ref 1.6–2.6)

## 2023-02-28 LAB — TACROLIMUS LEVEL: TACROLIMUS BLOOD: 23.2 ng/mL (ref 2.0–20.0)

## 2023-02-28 MED ADMIN — pantoprazole (Protonix) EC tablet 40 mg: 40 mg | ORAL | @ 13:00:00

## 2023-02-28 MED ADMIN — gabapentin (NEURONTIN) capsule 400 mg: 400 mg | ORAL | @ 20:00:00

## 2023-02-28 MED ADMIN — mycophenolate (CELLCEPT) capsule 250 mg: 250 mg | ORAL | @ 13:00:00

## 2023-02-28 MED ADMIN — valGANciclovir (VALCYTE) tablet 450 mg: 450 mg | ORAL | @ 13:00:00

## 2023-02-28 MED ADMIN — predniSONE (DELTASONE) tablet 5 mg: 5 mg | ORAL | @ 13:00:00

## 2023-02-28 MED ADMIN — oxyCODONE (ROXICODONE) immediate release tablet 5 mg: 5 mg | ORAL | @ 20:00:00 | Stop: 2023-03-14

## 2023-02-28 MED ADMIN — oxyCODONE (ROXICODONE) immediate release tablet 5 mg: 5 mg | ORAL | @ 18:00:00 | Stop: 2023-03-14

## 2023-02-28 MED ADMIN — tacrolimus (PROGRAF) capsule 9 mg: 9 mg | ORAL | @ 13:00:00 | Stop: 2023-02-28

## 2023-02-28 MED ADMIN — lactated Ringers infusion: 125 mL/h | INTRAVENOUS | @ 10:00:00

## 2023-02-28 MED ADMIN — ondansetron (ZOFRAN) injection 4 mg: 4 mg | INTRAVENOUS | @ 06:00:00 | Stop: 2023-02-28

## 2023-02-28 MED ADMIN — enoxaparin (LOVENOX) syringe 40 mg: 40 mg | SUBCUTANEOUS | @ 23:00:00

## 2023-02-28 MED ADMIN — morphine 4 mg/mL injection 4 mg: 4 mg | INTRAVENOUS | @ 06:00:00 | Stop: 2023-02-28

## 2023-02-28 MED ADMIN — lactated Ringers infusion: 125 mL/h | INTRAVENOUS | @ 20:00:00

## 2023-02-28 MED ADMIN — methocarbamol (ROBAXIN) tablet 500 mg: 500 mg | ORAL | @ 20:00:00

## 2023-02-28 MED ADMIN — aspirin tablet 325 mg: 325 mg | ORAL | @ 10:00:00 | Stop: 2023-02-28

## 2023-02-28 NOTE — Unmapped (Signed)
Patient with 10/10 back pain starting Thursday. Liver transplant 12 days ago. Told to come in by transplant team.     Denies fevers. VSS.

## 2023-02-28 NOTE — Unmapped (Signed)
[facility] Emergency Department Provider Note         ED Clinical Impression     Final diagnoses:   Abdominal pain, unspecified abdominal location (Primary)       Presenting History and MDM     HPI    February 27, 2023 11:22 PM   Alicia Armstrong is a 47 y.o. female who  has a past medical history of Cirrhosis (CMS-HCC).  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  Patient reports lower back pain that started 1 to 2 days after liver transplant 12 days ago.  Reports that she could not sleep last night due to worsening lower back pain.  Patient reports pain is sharp and preventing her from sleeping, and feels like that is burning her.  Patient has been urinating normally, without burning and denies blood in pee.  Patient has had normal bowel movements, nonbloody.  Patient has tried taking Tylenol and ibuprofen without relief of symptoms.  Last Tylenol was taken at 7 PM.  Patient was prescribed oxycodone, but did not want to take.  Patient denies trauma, chest pain, shortness of breath.    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  Patient is exquisitely tender to palpation of bilateral flank region.  Abdominal surgery site from liver transplant is not covered, appears to be well-healing, does not look erythematous, and without drainage.  Patient is able to ambulate with pain.    Diagnostic workup as below:     Orders Placed This Encounter   Procedures    US Liver Transplant    XR Chest Portable    CT Thoracic Spine Wo Contrast    CT Lumbar Spine Reformat W Contrast    CBC w/ Differential    Comprehensive metabolic panel    PT-INR    hCG QUANTitative, Blood    Urinalysis with Microscopy with Culture Reflex    Lipase    Lactate, Venous, Whole Blood    CBC w/ Differential    Basic Metabolic Panel    Magnesium Level    Phosphorus Level    Hepatic Function Panel    NPO Sips with meds; Medically necessary    Vital signs    Notify Provider    Patient may shower    Measure height    Weigh patient    Intake and Output    Flush line per protocol    Place and maintain sequential compression device    Full Code    ECG 12 Lead    Echocardiogram W Colorflow Spectral Doppler    Insert peripheral IV    Saline lock IV    Place Patient in Bed       XR Chest Portable   Final Result      No acute abnormalities.      US Liver Transplant   Final Result   New parvus tardus waveform, low RI, and blunted systolic peak velocity within the right and left hepatic arteries, with borderline findings of the common hepatic artery. Finding is concerning for thrombosis or stenosis at the level of the  common hepatic artery.      Monophasic waveforms of the hepatic veins, new from prior.         The findings of this study were discussed via telephone with DR. Domenick Bookbinder by Dr. Steva Colder on 02/28/2023 5:43 AM.                  CT Thoracic Spine Wo Contrast    (Results Pending)   Echocardiogram W Colorflow Spectral Doppler    (Results Pending)   CT Lumbar Spine Reformat W Contrast    (Results Pending)          Will reassess as we get results and update below      ED Course as of 02/28/23 0953   [facility] Feb 28, 2023   0215 Urinalysis with Microscopy with Culture Reflex(!):    Color, UA Light Yellow   Clarity, UA Clear   Spec Grav, UA 1.008   pH, UA 5.0   Leukocyte Esterase, UA Negative   Nitrite, UA Negative   Protein, UA Negative   Glucose, UA Negative   Ketones, UA Negative   Urobilinogen, UA <2.0 mg/dL   Bilirubin, UA Negative   Blood, UA Negative   RBC, UA <1   WBC, UA 1   Squam Epithel, UA 3   Bacteria, UA None Seen   Mucus, UA Rare(!)  UA negative LE and blood.   0221 CT canceled by surgery.   0345 PT in liver US, husband says she has been sleeping comfortably.   1610 US Liver Transplant  [place] image available, pending read   [phone] Radiology called to report reduced resistive indices concerning for stenosis or thrombosis in the common hepatic artery.   5409 Paged surgery       MDM: Given patient's recent liver transplant, this was of acute concern.  Initially a CT scan was ordered, but surgery advised a liver Doppler.   Considered in the differential were surgical complications such as retroperitoneal bleed, pyelonephritis, kidney stones, renal infarction, AAA, ectopic pregnancy, and ovarian torsion.  Given the recency of her surgery,    Her urinalysis did not show elevated LE or RBCs, which indicated the suspicion for pyelonephritis or kidney stones low.  Her liver Doppler was reported as an urgent finding suspicious for thrombosis or stenosis of the common hepatic artery, and patient was admitted to surgery for further evaluation and intervention.       _____________________________________________________________________    The case was discussed with attending physician who is in agreement with the above assessment and plan    Additional Medical Decision Making     I have reviewed the vital signs and the nursing notes. Labs and radiology results that were available during my care of the patient were independently reviewed by me and considered in my medical decision making.   I independently visualized the EKG tracing if performed  I independently visualized the radiology images if performed  I reviewed the patient's prior medical records if available.  Additional history obtained from family if available    Other History     CHIEF COMPLAINT:   Chief Complaint   Patient presents with    Back Pain    Post-op Problem     Liver transplant         PAST MEDICAL HISTORY/PAST SURGICAL HISTORY:   Past Medical History:   Diagnosis Date    Cirrhosis (CMS-HCC)        Past Surgical History: Procedure Laterality Date  CESAREAN SECTION      PR COLSC FLX W/RMVL OF TUMOR POLYP LESION SNARE TQ N/A 09/11/2022    Procedure: COLONOSCOPY FLEX; W/REMOV TUMOR/LES BY SNARE;  Surgeon: Mickie Hillier, MD;  [facility]: [facility];  Service: Gastroenterology    PR REMV VEIN CLOT CAVA-ILIAC,LEG INCIS N/A 02/14/2023    Procedure: THROMBEC DIRECT OR W/CATH; VENA CAVA BY LEG INCS;  Surgeon: Florene Glen, MD;  Location: MAIN OR [place];  Service: Transplant    PR TRANSPLANT LIVER,ALLOTRANSPLANT Bilateral 02/14/2023    Procedure: LIVER ALLOTRANSPLANTATION; ORTHOTOPIC, PARTIAL OR WHOLE, FROM CADAVER OR LIVING DONOR, ANY AGE;  Surgeon: Florene Glen, MD;  Location: MAIN OR [place];  Service: Transplant    PR TRANSPLANT,PREP DONOR LIVER, WHOLE N/A 02/14/2023    Procedure: BACKBNCH STD PREP CAD DONOR WHOLE LIVER GFT PRIOR TNSPLNT,INC CHOLE,DISS/REM SURR TISSU WO TRISEG/LOBE SPLT;  Surgeon: Florene Glen, MD;  Location: MAIN OR [place];  Service: Transplant    PR UPPER GI ENDOSCOPY,DIAGNOSIS N/A 04/02/2017    Procedure: UGI ENDO, INCLUDE ESOPHAGUS, STOMACH, & DUODENUM &/OR JEJUNUM; DX W/WO COLLECTION SPECIMN, BY BRUSH OR WASH;  Surgeon: Janyth Pupa, MD;  [facility];  Service: Gastroenterology    PR UPPER GI ENDOSCOPY,DIAGNOSIS N/A 09/11/2022    Procedure: UGI ENDO, INCLUDE ESOPHAGUS, STOMACH, & DUODENUM &/OR JEJUNUM; DX W/WO COLLECTION SPECIMN, BY BRUSH OR WASH;  Surgeon: Mickie Hillier, MD;  [facility]: [facility];  Service: Gastroenterology       MEDICATIONS:     Current Facility-Administered Medications:     lactated Ringers infusion, 125 mL/hr, Intravenous, Continuous, Rickard Rhymes, MD, Last Rate: 125 mL/hr at 02/28/23 0623, 125 mL/hr at 02/28/23 1610    mycophenolate (CELLCEPT) capsule 250 mg, 250 mg, Oral, BID, Awe, Ralph Leyden, MD, 250 mg at 02/28/23 0845    oxyCODONE (ROXICODONE) immediate release tablet 5 mg, 5 mg, Oral, Q4H PRN **OR** oxyCODONE (ROXICODONE) immediate release tablet 10 mg, 10 mg, Oral, Q4H PRN, Awe, Ralph Leyden, MD    pantoprazole (Protonix) EC tablet 40 mg, 40 mg, Oral, Daily, Awe, Ralph Leyden, MD, 40 mg at 02/28/23 0844    predniSONE (DELTASONE) tablet 5 mg, 5 mg, Oral, Daily, Awe, Ralph Leyden, MD, 5 mg at 02/28/23 0844    tacrolimus (PROGRAF) capsule 9 mg, 9 mg, Oral, BID, Awe, Ralph Leyden, MD, 9 mg at 02/28/23 0845    valGANciclovir (VALCYTE) tablet 450 mg, 450 mg, Oral, Daily, Awe, Adam M, MD, 450 mg at 02/28/23 0845    Current Outpatient Medications:     acetaminophen (TYLENOL) 325 MG tablet, Take 1-2 tablets (325-650 mg total) by mouth every eight (8) hours as needed for pain or fever., Disp: 60 tablet, Rfl: 11    aspirin (ECOTRIN) 81 MG tablet, Take 1 tablet (81 mg total) by mouth daily., Disp: 30 tablet, Rfl: 11    blood sugar diagnostic (GLUCOSE BLOOD) Strp, Use to check blood sugar as directed with insulin 3 times a day & for symptoms of high or low blood sugar., Disp: 100 strip, Rfl: 0    blood-glucose meter kit, Use as instructed, Disp: 1 each, Rfl: 0    blood-glucose meter Misc, Use as directed, Disp: 1 each, Rfl: 0    calcium carbonate (TUMS) 200 mg calcium (500 mg) chewable tablet, Chew 1 tablet (500 mg total) Three (3) times a day., Disp: 150 tablet, Rfl: 0    docusate sodium (COLACE) 100 MG capsule, Take 1 capsule (100 mg total)  by mouth two (2) times a day as needed for constipation., Disp: 60 capsule, Rfl: 11    furosemide (LASIX) 20 MG tablet, Take 1 tablet (20 mg total) by mouth daily. Stop on Monday, 7/8, Disp: 30 tablet, Rfl: 0    gabapentin (NEURONTIN) 400 MG capsule, Take 1 capsule (400 mg total) by mouth Three (3) times a day., Disp: 90 capsule, Rfl: 3    insulin lispro (HUMALOG) 100 unit/mL injection pen, Inject 0-10 Units under the skin Three (3) times a day before meals. Inject 1 unit for every 50 mg/dL over 664 mg/dL before meals, Disp: 15 mL, Rfl: 0    lancets Misc, Use to check blood sugar as directed with insulin 3 times a day & for symptoms of high or low blood sugar., Disp: 100 each, Rfl: 0    magnesium oxide-Mg AA chelate (MAGNESIUM, AMINO ACID CHELATE,) 133 mg, (Hold until directed to take by nurse coordinator) Take 1 tablet by mouth two (2) times a day., Disp: 100 tablet, Rfl: 6    methocarbamol (ROBAXIN) 500 MG tablet, Take 1 tablet (500 mg total) by mouth Three (3) times a day as needed., Disp: 90 tablet, Rfl: 11    mycophenolate (CELLCEPT) 250 mg capsule, Take 1 capsule (250 mg total) by mouth two (2) times a day., Disp: 60 capsule, Rfl: 11    oxyCODONE (ROXICODONE) 5 MG immediate release tablet, Take 1 tablet (5 mg total) by mouth every four (4) hours as needed for pain, Disp: 10 tablet, Rfl: 0    pantoprazole (PROTONIX) 40 MG tablet, Take 1 tablet (40 mg total) by mouth daily., Disp: 30 tablet, Rfl: 11    pen needle, diabetic 32 gauge x 5/32 (4 mm) Ndle, Use with insulin up to 4 times/day as needed., Disp: 100 each, Rfl: 0    [START ON 03/20/2023] pentamidine in sterile water, Inhale 6 mL (300 mg total) every twenty-eight (28) days., Disp: , Rfl:     polyethylene glycol (MIRALAX) 17 gram packet, Mix 1 packet in  4 to 8 ounces of liquid and drink daily as needed., Disp: 30 packet, Rfl: 11    predniSONE (DELTASONE) 5 MG tablet, Take 1 tablet (5 mg total) by mouth daily., Disp: 30 tablet, Rfl: 11    SITagliptin phosphate (JANUVIA) 100 MG tablet, Take 1 tablet (100 mg total) by mouth daily., Disp: 90 tablet, Rfl: 3    tacrolimus (PROGRAF) 1 MG capsule, Take 9 capsules (9 mg total) by mouth two (2) times a day., Disp: 600 capsule, Rfl: 11    valGANciclovir (VALCYTE) 450 mg tablet, Take 1 tablet (450 mg total) by mouth daily., Disp: 30 tablet, Rfl: 2    ALLERGIES:   Penicillins    SOCIAL HISTORY:   Social History     Tobacco Use    Smoking status: Never    Smokeless tobacco: Never   Substance Use Topics    Alcohol use: No     Alcohol/week: 0.0 standard drinks of alcohol       FAMILY HISTORY:  Family History Problem Relation Age of Onset    No Known Problems Mother     Diabetes Father     No Known Problems Sister     No Known Problems Daughter     No Known Problems Maternal Grandmother     No Known Problems Maternal Grandfather     No Known Problems Paternal Grandmother     No Known Problems Paternal Grandfather     BRCA 1/2 Neg  Hx     Breast cancer Neg Hx     Cancer Neg Hx     Colon cancer Neg Hx     Endometrial cancer Neg Hx     Ovarian cancer Neg Hx           Review of Systems    A 10 point review of systems was performed and is negative other than positive elements noted in HPI     Physical Exam   GENERAL APPEARANCE:  AxOx4, tearful and in significant pain.  HEAD: Normocephalic and atraumatic.  EYES: Pupils are equal reactive bilaterally.  Extraocular movements intact.  Clear conjunctiva.  No scleral icterus.  EARS: Tympanic membrane's clear bilaterally.  No purulence or swelling of the x-ray auditory canals.  No periauricular tenderness or fullness.  NOSE: Septum is midline.  No evidence of epistaxis or bleeding.  THROAT: Uvula is midline.  No intraoral swelling.  Soft underneath the tongue.  No submandibular tenderness or brawny neck edema.  No pharyngeal erythema or exudates.  NECK:  Supple without lymphadenopathy.  No stiffness or restricted ROM.  CHEST: Chest wall is nontender to palpation.  Chest wall stable.  No evidence of trauma or rash.  HEART:  Rate as above, normal S1/S1, no m/r/g  LUNGS:  CTAB, moving air well. No crackles or wheezes are heard.  ABDOMEN: Surgery incision intact, noncovered, nonerythematous and without drainage.  Mild diffuse tenderness.  Soft, nondistended with good bowel sounds heard.  BACK: Exquisite CVAT, no obvious deformity.  No midline tenderness to palpation.  EXTREMITIES: Full range of motion without cyanosis, clubbing or edema.  NEUROLOGICAL:  Grossly nonfocal. Moving all 4 extremities. Observed to ambulate with normal gait.  Skin:  Warm and dry without any rash.  PSYCH: No evidence of psychosis.  No suicidal or homicidal ideation    Radiology     XR Chest Portable   Final Result      No acute abnormalities.      US Liver Transplant   Final Result   New parvus tardus waveform, low RI, and blunted systolic peak velocity within the right and left hepatic arteries, with borderline findings of the common hepatic artery. Finding is concerning for thrombosis or stenosis at the level of the common hepatic artery.      Monophasic waveforms of the hepatic veins, new from prior.         The findings of this study were discussed via telephone with DR. Domenick Bookbinder by Dr. Steva Colder on 02/28/2023 5:43 AM.                  CT Thoracic Spine Wo Contrast    (Results Pending)   Echocardiogram W Colorflow Spectral Doppler    (Results Pending)   CT Lumbar Spine Reformat W Contrast    (Results Pending)       Labs     Labs Reviewed   COMPREHENSIVE METABOLIC PANEL - Abnormal; Notable for the following components:       Result Value    Sodium 131 (*)     Anion Gap 3 (*)     BUN 24 (*)     Creatinine 1.05 (*)     Calcium 8.6 (*)     Albumin 3.3 (*)     Total Protein 5.4 (*)     Total Bilirubin 1.4 (*)     All other components within normal limits   LIPASE - Abnormal; Notable for the following components:    Lipase  68 (*)     All other components within normal limits   BASIC METABOLIC PANEL - Abnormal; Notable for the following components:    Anion Gap 2 (*)     Calcium 7.9 (*)     All other components within normal limits   MAGNESIUM - Abnormal; Notable for the following components:    Magnesium 1.5 (*)     All other components within normal limits   HEPATIC FUNCTION PANEL - Abnormal; Notable for the following components:    Albumin 2.8 (*)     Total Protein 4.8 (*)     Total Bilirubin 1.6 (*)     Bilirubin, Direct 0.90 (*)     Alkaline Phosphatase 122 (*)     All other components within normal limits   CBC W/ AUTO DIFF - Abnormal; Notable for the following components:    WBC 2.9 (*)     RBC 2.93 (*) HGB 9.4 (*)     HCT 27.4 (*)     RDW 18.7 (*)     Platelet 103 (*)     Absolute Lymphocytes 0.4 (*)     Absolute Monocytes 0.2 (*)     Anisocytosis Slight (*)     All other components within normal limits   URINALYSIS WITH MICROSCOPY WITH CULTURE REFLEX PERFORMABLE - Abnormal; Notable for the following components:    Mucus, UA Rare (*)     All other components within normal limits   CBC W/ AUTO DIFF - Abnormal; Notable for the following components:    WBC 2.2 (*)     RBC 2.42 (*)     HGB 7.8 (*)     HCT 22.5 (*)     RDW 18.6 (*)     Platelet 91 (*)     Absolute Neutrophils 1.6 (*)     Absolute Lymphocytes 0.3 (*)     Absolute Monocytes 0.2 (*)     Anisocytosis Slight (*)     All other components within normal limits   LACTATE, VENOUS, WHOLE BLOOD - Normal   PHOSPHORUS - Normal   POCT GLUCOSE, INTERFACED - Normal   CBC W/ DIFFERENTIAL    Narrative:     The following orders were created for panel order CBC w/ Differential.                  Procedure                               Abnormality         Status                                     ---------                               -----------         ------                                     CBC w/ Differential[[id number]]         Abnormal            Final result  Please view results for these tests on the individual orders.   PROTIME-INR   HCG QUANTITATIVE, BLOOD    Narrative:     Non-Pregnant Females: 1.5 - 4.2 mIU/mL                                    Post and Peri-menopausal Females: 1.8 - 10.1 mIU/mL                                    During pregnancy, hCG increases exponentially for about 8-10 weeks after conception and begins falling at about 12 weeks. Week-to-week hCG levels during pregnancy show significant overlap and are not a reliable means of determining gestational age. Post and peri-menopausal females may have low levels of detectable hCG due to pituitary production of hCG; in such cases correlation with serum FSH may be helpful. Test interference may occur from heterophilic antibodies or high levels of biotin.                                       URINALYSIS WITH MICROSCOPY WITH CULTURE REFLEX    Narrative:     The following orders were created for panel order Urinalysis with Microscopy with Culture Reflex.                  Procedure                               Abnormality         Status                                     ---------                               -----------         ------                                     Urinalysis with Microsc.Marland KitchenMarland Kitchen[[id number]]  Abnormal            Final result                                                 Please view results for these tests on the individual orders.   CBC W/ DIFFERENTIAL    Narrative:     The following orders were created for panel order CBC w/ Differential.                  Procedure                               Abnormality         Status                                     ---------                               -----------         ------  CBC w/ Differential[([id number]]         Abnormal            Final result                                                 Please view results for these tests on the individual orders.   POCT GLUCOSE-RN OBTAIN   POCT GLUCOSE-RN OBTAIN   POCT GLUCOSE-RN OBTAIN   POCT GLUCOSE-RN OBTAIN   POCT GLUCOSE-RN OBTAIN       Please note- This chart has been created using [employer]. Chart creation errors have been sought, but may not always be located and such creation errors, especially pronoun confusion, do NOT reflect on the standard of medical care.      Davonna Belling, MD  Resident  02/28/23 [phone]

## 2023-02-28 NOTE — Unmapped (Signed)
Tacrolimus Therapeutic Monitoring Pharmacy Note    Alicia Armstrong is a 47 y.o. female continuing tacrolimus.     Indication: Liver transplant     Date of Transplant:  02/15/23       Prior Dosing Information: Home regimen 9 mg BID       Source(s) of information used to determine prior to admission dosing: Fill HIstory    Goals:  Therapeutic Drug Levels  Tacrolimus trough goal:  8-10 ng/mL    Additional Clinical Monitoring/Outcomes  Monitor renal function (SCr and urine output) and liver function (LFTs)  Monitor for signs/symptoms of adverse events (e.g., hyperglycemia, hyperkalemia, hypomagnesemia, hypertension, headache, tremor)    Results:   Tacrolimus level:  23.2 ng/mL, drawn appropriately 7/5 outpatient.     Pharmacokinetic Considerations and Significant Drug Interactions:  Concurrent hepatotoxic medications: None identified  Concurrent CYP3A4 substrates/inhibitors: None identified  Concurrent nephrotoxic medications: None identified    Assessment/Plan:  Recommendedation(s)  Hold 7/7 PM tacrolimus dose   Decrease tacrolimus to 7 mg BID starting 7/8 AM    Follow-up  Daily levels: next level to be drawn 7/8 AM .   A pharmacist will continue to monitor and recommend levels as appropriate    Please page service pharmacist with questions/clarifications.    Beryle Lathe, PharmD

## 2023-02-28 NOTE — Unmapped (Signed)
Pt here for lower back pain that started Thursday while laying down. Denies N/V/D/fever/UTI symptoms. Pt recently underwent liver transplant 12 days ago. Oxy not helping.

## 2023-02-28 NOTE — Unmapped (Signed)
Transplant Surgery H&P Note    02/28/2023    Requesting Attending Physician:  Gemma Payor, MD  Service Requesting Consult: Surg Transplant [facility])  Consult Attending: Dr. Edwin Dada    Room: 19-B/19-B    Assessment/Plan:    Alicia Armstrong is a 47 y.o. 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 Labs were collected in the ED, which showed Tbili to 1.4, AST/ALT 22/41    - Admit to SRF  - MMPC  - NPO, MIVF  - CTA A/P to evaluate transplanted liver vascular supply  - Increase ASA to 325mg  daily  - Continue home immunosuppressives.     Thank you for including [place] in this patient's care. If you have any questions, concerns or changes in the patient's clinical status, please page [facility] consult pager [phone].     History of Present Illness:     Alicia Armstrong is a 47 y.o. 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 She is seen in consultation at the request of ED for diffuse lower back pain. She reports that the abdominal pain was present at discharge, but was mild and improved with pain medications. Two days after discharge, there was an acute onset of same pain, but now 11/10 that radiates to the anterior abdomen. She denied nausea, vomiting, or fever. She is afebrile and HDS on room air. LFTs are elevated, notably Tbili to 1.4, but overall stable from time of discharge. She continues on her immunosuppressive medications. Liver doppler with new parvus tardus waveform and blunted systolic peak velocity with R and L hepatic arteries; monophasic waveforms in hepatic veins.    Past Medical History    Past Medical History:   Diagnosis Date    Cirrhosis (CMS-HCC)        Past Surgical History    Past Surgical History:   Procedure Laterality Date    CESAREAN SECTION      PR COLSC FLX W/RMVL OF TUMOR POLYP LESION SNARE TQ N/A 09/11/2022    Procedure: COLONOSCOPY FLEX; W/REMOV TUMOR/LES BY SNARE;  Surgeon: Mickie Hillier, MD;  Location: GI [facility];  Service: Gastroenterology    PR REMV VEIN CLOT CAVA-ILIAC,LEG INCIS N/A 02/14/2023    Procedure: THROMBEC DIRECT OR W/CATH; VENA CAVA BY LEG INCS;  Surgeon: Florene Glen, MD;  Location: MAIN OR [place];  Service: Transplant    PR TRANSPLANT LIVER,ALLOTRANSPLANT Bilateral 02/14/2023    Procedure: LIVER ALLOTRANSPLANTATION; ORTHOTOPIC, PARTIAL OR WHOLE, FROM CADAVER OR LIVING DONOR, ANY AGE;  Surgeon: Florene Glen, MD;  Location: MAIN OR [place];  Service: Transplant    PR TRANSPLANT,PREP DONOR LIVER, WHOLE N/A 02/14/2023    Procedure: [facility] STD PREP CAD DONOR WHOLE LIVER GFT PRIOR TNSPLNT,INC CHOLE,DISS/REM SURR TISSU WO TRISEG/LOBE SPLT;  Surgeon: Florene Glen, MD;  Location: MAIN OR [place];  Service: Transplant    PR UPPER GI ENDOSCOPY,DIAGNOSIS N/A 04/02/2017    Procedure: UGI ENDO, INCLUDE ESOPHAGUS, STOMACH, & DUODENUM &/OR JEJUNUM; DX W/WO COLLECTION SPECIMN, BY BRUSH OR WASH;  Surgeon: Janyth Pupa, MD;  Location: GI [facility];  Service: Gastroenterology    PR UPPER GI ENDOSCOPY,DIAGNOSIS N/A 09/11/2022    Procedure: UGI ENDO, INCLUDE ESOPHAGUS, STOMACH, & DUODENUM &/OR JEJUNUM; DX W/WO COLLECTION SPECIMN, BY BRUSH OR WASH;  Surgeon: Mickie Hillier, MD;  Location: GI  PROCEDURES [facility];  Service: Gastroenterology       Medications      No current facility-administered medications on file prior to encounter.     Current Outpatient Medications on File Prior to Encounter   Medication Sig    acetaminophen (TYLENOL) 325 MG tablet Take 1-2 tablets (325-650 mg total) by mouth every eight (8) hours as needed for pain or fever.    aspirin (ECOTRIN) 81 MG tablet Take 1 tablet (81 mg total) by mouth daily.    blood sugar diagnostic (GLUCOSE BLOOD) Strp Use to check blood sugar as directed with insulin 3 times a day & for symptoms of high or low blood sugar.    blood-glucose meter kit Use as instructed    blood-glucose meter Misc Use as directed    calcium carbonate (TUMS) 200 mg calcium (500 mg) chewable tablet Chew 1 tablet (500 mg total) Three (3) times a day.    docusate sodium (COLACE) 100 MG capsule Take 1 capsule (100 mg total) by mouth two (2) times a day as needed for constipation.    furosemide (LASIX) 20 MG tablet Take 1 tablet (20 mg total) by mouth daily. Stop on Monday, 7/8    gabapentin (NEURONTIN) 400 MG capsule Take 1 capsule (400 mg total) by mouth Three (3) times a day.    insulin lispro (HUMALOG) 100 unit/mL injection pen Inject 0-10 Units under the skin Three (3) times a day before meals. Inject 1 unit for every 50 mg/dL over 098 mg/dL before meals    lancets Misc Use to check blood sugar as directed with insulin 3 times a day & for symptoms of high or low blood sugar.    magnesium oxide-Mg AA chelate (MAGNESIUM, AMINO ACID CHELATE,) 133 mg (Hold until directed to take by nurse coordinator)  Take 1 tablet by mouth two (2) times a day.    methocarbamol (ROBAXIN) 500 MG tablet Take 1 tablet (500 mg total) by mouth Three (3) times a day as needed.    mycophenolate (CELLCEPT) 250 mg capsule Take 1 capsule (250 mg total) by mouth two (2) times a day.    oxyCODONE (ROXICODONE) 5 MG immediate release tablet Take 1 tablet (5 mg total) by mouth every four (4) hours as needed for pain    pantoprazole (PROTONIX) 40 MG tablet Take 1 tablet (40 mg total) by mouth daily.    pen needle, diabetic 32 gauge x 5/32 (4 mm) Ndle Use with insulin up to 4 times/day as needed.    [START ON 03/20/2023] pentamidine in sterile water Inhale 6 mL (300 mg total) every twenty-eight (28) days.    polyethylene glycol (MIRALAX) 17 gram packet Mix 1 packet in  4 to 8 ounces of liquid and drink daily as needed.    predniSONE (DELTASONE) 5 MG tablet Take 1 tablet (5 mg total) by mouth daily.    SITagliptin phosphate (JANUVIA) 100 MG tablet Take 1 tablet (100 mg total) by mouth daily.    tacrolimus (PROGRAF) 1 MG capsule Take 9 capsules (9 mg total) by mouth two (2) times a day.    valGANciclovir (VALCYTE) 450 mg tablet Take 1 tablet (450 mg total) by mouth daily.       Allergies    Penicillins      Family History    Family History   Problem Relation Age of Onset    No Known Problems Mother     Diabetes Father     No Known Problems Sister  No Known Problems Daughter     No Known Problems Maternal Grandmother     No Known Problems Maternal Grandfather     No Known Problems Paternal Grandmother     No Known Problems Paternal Grandfather     BRCA 1/2 Neg Hx     Breast cancer Neg Hx     Cancer Neg Hx     Colon cancer Neg Hx     Endometrial cancer Neg Hx     Ovarian cancer Neg Hx          Social History:    Social History     Tobacco Use    Smoking status: Never    Smokeless tobacco: Never   Vaping Use    Vaping status: Never Used   Substance Use Topics    Alcohol use: No     Alcohol/week: 0.0 standard drinks of alcohol    Drug use: No         Review of Systems    A 10 system review of systems was negative except as noted in HPI.      Vital Signs  BP 138/83  - Pulse 96  - Temp 36.7 ??C (98.1 ??F) (Oral)  - Resp 16  - LMP 11/02/2022 (Approximate)  - SpO2 93%   There is no height or weight on file to calculate BMI.    Physical Exam  General Appearance: Uncomfortable. Alert and oriented.   Pulmonary: Normal respiratory effort.   Cardiovascular: Normal rate, regular rhythm.   Abdomen: soft, distended, non-tender. Well healed Mercedes incisions with intact staples.  Extremities: warm and well perfused, no edema  Neurologic: No motor abnormalities noted. Sensation grossly intact.  Skin: Skin color normal. No rashes or lesions.    Data Review  Labs:  All lab results last 24 hours:    Recent Results (from the past 24 hour(s))   Comprehensive metabolic panel    Collection Time: 02/27/23 11:15 PM   Result Value Ref Range    Sodium 131 (L) 135 - 145 mmol/L Potassium 4.5 3.4 - 4.8 mmol/L    Chloride 100 98 - 107 mmol/L    CO2 28.0 20.0 - 31.0 mmol/L    Anion Gap 3 (L) 5 - 14 mmol/L    BUN 24 (H) 9 - 23 mg/dL    Creatinine 1.61 (H) 0.55 - 1.02 mg/dL    BUN/Creatinine Ratio 23     eGFR CKD-EPI (2021) Female 66 >=60 mL/min/1.71m2    Glucose 129 70 - 179 mg/dL    Calcium 8.6 (L) 8.7 - 10.4 mg/dL    Albumin 3.3 (L) 3.4 - 5.0 g/dL    Total Protein 5.4 (L) 5.7 - 8.2 g/dL    Total Bilirubin 1.4 (H) 0.3 - 1.2 mg/dL    AST 22 <=09 U/L    ALT 41 10 - 49 U/L    Alkaline Phosphatase 77 46 - 116 U/L   PT-INR    Collection Time: 02/27/23 11:15 PM   Result Value Ref Range    PT 12.3 9.9 - 12.6 sec    INR 1.10    CBC w/ Differential    Collection Time: 02/27/23 11:15 PM   Result Value Ref Range    WBC 2.9 (L) 3.6 - 11.2 10*9/L    RBC 2.93 (L) 3.95 - 5.13 10*12/L    HGB 9.4 (L) 11.3 - 14.9 g/dL    HCT 60.4 (L) 54.0 - 44.0 %    MCV 93.5 77.6 - 95.7 fL  MCH 32.1 25.9 - 32.4 pg    MCHC 34.4 32.0 - 36.0 g/dL    RDW 16.1 (H) 09.6 - 15.2 %    MPV 7.8 6.8 - 10.7 fL    Platelet 103 (L) 150 - 450 10*9/L    Neutrophils % 74.9 %    Lymphocytes % 12.6 %    Monocytes % 7.7 %    Eosinophils % 3.7 %    Basophils % 1.1 %    Absolute Neutrophils 2.2 1.8 - 7.8 10*9/L    Absolute Lymphocytes 0.4 (L) 1.1 - 3.6 10*9/L    Absolute Monocytes 0.2 (L) 0.3 - 0.8 10*9/L    Absolute Eosinophils 0.1 0.0 - 0.5 10*9/L    Absolute Basophils 0.0 0.0 - 0.1 10*9/L    Anisocytosis Slight (A) Not Present   hCG QUANTitative, Blood    Collection Time: 02/27/23 11:15 PM   Result Value Ref Range    hCG Quantitative <2.6 mIU/mL   Lipase    Collection Time: 02/27/23 11:15 PM   Result Value Ref Range    Lipase 68 (H) 12 - 53 U/L   Urinalysis with Microscopy with Culture Reflex    Collection Time: 02/28/23 12:57 AM   Result Value Ref Range    Color, UA Light Yellow     Clarity, UA Clear     Specific Gravity, UA 1.008 1.003 - 1.030    pH, UA 5.0 5.0 - 9.0    Leukocyte Esterase, UA Negative Negative    Nitrite, UA Negative Negative    Protein, UA Negative Negative    Glucose, UA Negative Negative    Ketones, UA Negative Negative    Urobilinogen, UA <2.0 mg/dL <0.4 mg/dL    Bilirubin, UA Negative Negative    Blood, UA Negative Negative    RBC, UA <1 <=4 /HPF    WBC, UA 1 0 - 5 /HPF    Squam Epithel, UA 3 0 - 5 /HPF    Bacteria, UA None Seen None Seen /HPF    Mucus, UA Rare (A) None Seen /HPF   Lactate, Venous, Whole Blood    Collection Time: 02/28/23  2:04 AM   Result Value Ref Range    Lactate, Venous 0.6 0.5 - 1.8 mmol/L   ECG 12 Lead    Collection Time: 02/28/23  2:08 AM   Result Value Ref Range    EKG Systolic BP  mmHg    EKG Diastolic BP  mmHg    EKG Ventricular Rate 78 BPM    EKG Atrial Rate 78 BPM    EKG P-R Interval 142 ms    EKG QRS Duration 82 ms    EKG Q-T Interval 392 ms    EKG QTC Calculation 446 ms    EKG Calculated P Axis 11 degrees    EKG Calculated R Axis 12 degrees    EKG Calculated T Axis 30 degrees    QTC Fredericia 427 ms   CBC w/ Differential    Collection Time: 02/28/23  6:28 AM   Result Value Ref Range    WBC 2.2 (L) 3.6 - 11.2 10*9/L    RBC 2.42 (L) 3.95 - 5.13 10*12/L    HGB 7.8 (L) 11.3 - 14.9 g/dL    HCT 54.0 (L) 98.1 - 44.0 %    MCV 92.8 77.6 - 95.7 fL    MCH 32.0 25.9 - 32.4 pg    MCHC 34.5 32.0 - 36.0 g/dL    RDW 19.1 (H) 47.8 -  15.2 %    MPV 7.7 6.8 - 10.7 fL    Platelet 91 (L) 150 - 450 10*9/L    Neutrophils % 71.5 %    Lymphocytes % 13.6 %    Monocytes % 7.4 %    Eosinophils % 4.9 %    Basophils % 2.6 %    Absolute Neutrophils 1.6 (L) 1.8 - 7.8 10*9/L    Absolute Lymphocytes 0.3 (L) 1.1 - 3.6 10*9/L    Absolute Monocytes 0.2 (L) 0.3 - 0.8 10*9/L    Absolute Eosinophils 0.1 0.0 - 0.5 10*9/L    Absolute Basophils 0.1 0.0 - 0.1 10*9/L    Anisocytosis Slight (A) Not Present       Imaging:  US Liver Transplant    Result Date: 02/28/2023  EXAM: US LIVER TRANSPLANT ACCESSION: [id number] UN CLINICAL INDICATION: 47 years old with Abdominal pain s/p OLT  COMPARISON: 02/16/23 TECHNIQUE: Ultrasound views of the complete abdomen were obtained using gray scale and color and spectral Doppler imaging. FINDINGS: HEPATOBILIARY: The liver is normal in echogenicity. No focal hepatic lesions. No intrahepatic biliary ductal dilatation. The common bile duct is normal in caliber. The gallbladder is surgically absent.      Liver: 15.7 cm      Common bile duct: Not visualized. PANCREAS: Not well seen due to overlying bowel gas. SPLEEN: Normal in size and echotexture.       Spleen: 13.9 cm KIDNEYS: Normal in size and echotexture. No hydronephrosis.      Right kidney: 11.0 cm      Left kidney: 10.5 cm VESSELS: - Portal vein: The main, left and right portal veins are patent with hepatopetal flow. Normal main portal vein velocity (0.20 m/s or greater)      Main portal vein diameter: 1.0 cm      Main portal vein pre anastomosis velocity: 0.31 m/s      Main portal vein anastomosis velocity: 0.73 m/s      Main portal vein post anastomosis velocity: 1.0 m/s      Anterior right portal vein velocity: 0.17 m/s      Posterior right portal vein velocity: 0.27 m/s      Left portal vein velocity: 0.33 m/s      Right portal vein flow: hepatopetal      Left portal vein flow: hepatopetal - Splenic vein: Patent, with hepatopetal flow.      Splenic vein midline: hepatopetal      Splenic vein proximal: hepatopetal - Hepatic veins/IVC: The IVC, left, middle and right hepatic veins are were patent.      Left hepatic vein phasicity/flow: monophasic      Middle hepatic vein phasicity/flow: monophasic      Right hepatic vein phasicity/flow: monophasic      Inferior vena cava phasicity/flow: mono-bi - Hepatic artery: Parvus tardus waveform, low resistive indices, and blunted systolic peak velocity in the right and left hepatic arteries. There is borderline low resistive index and mild parvus tardus waveform within the common hepatic artery.           Common hepatic artery resistive index: 0.62 and systolic acceleration time 2 msec      Right hepatic artery resistive index: 0.55 and systolic acceleration time 7 msec      Left hepatic artery resistive index: 0.32 and systolic acceleration time 3 msec - Visualized proximal aorta:  unremarkable OTHER: Mild to moderate volume abdominopelvic ascites.     New parvus tardus waveform, low RI, and blunted systolic peak velocity within  the right and left hepatic arteries, with borderline findings of the common hepatic artery. Finding is concerning for thrombosis or stenosis at the level of the common hepatic artery. Monophasic waveforms of the hepatic veins, new from prior. The findings of this study were discussed via telephone with DR. Domenick Bookbinder by Dr. Steva Colder on 02/28/2023 5:43 AM.     ECG 12 Lead    Result Date: 02/28/2023  NORMAL SINUS RHYTHM NORMAL ECG WHEN COMPARED WITH ECG OF 16-Feb-2023 11:36, QT HAS SHORTENED     Idalia Needle, MD  PGY-4 General Surgery

## 2023-02-28 NOTE — Unmapped (Signed)
On call page received from [facility] - spoke with daughter who states patient with diffuse low back pain, Denies fever, n/v , and endorses adequate po intake. Non responsive to tylenol or ice pack.  Discussed with Dr Edwin Dada, patient to present to [facility] ED for further eval given liver transplant < 14 days ago     Return call to daughter, discussed POC. She stated understanding and she / patient will head to [facility] ED shortly     SRF to be paged upon arrival

## 2023-02-28 NOTE — Unmapped (Signed)
ED Progress Note    ED Course as of 02/28/23 0742   Wynelle Link Feb 28, 2023   0715 Signout: 47 yo F s/p liver transplant 10 days ago; severe lower back pain; [place] with c/f thrombosis of the [facility]; surgery is admitted       Going to go to the OR

## 2023-02-28 NOTE — Unmapped (Signed)
Tacrolimus Therapeutic Monitoring Pharmacy Note    Gwendolen Keilman is a 47 y.o. female continuing tacrolimus.     Indication: Liver transplant     Date of Transplant:  02/15/23       Prior Dosing Information: Home regimen 9 mg BID       Source(s) of information used to determine prior to admission dosing: Fill HIstory    Goals:  Therapeutic Drug Levels  Tacrolimus trough goal:  8-10 ng/mL    Additional Clinical Monitoring/Outcomes  Monitor renal function (SCr and urine output) and liver function (LFTs)  Monitor for signs/symptoms of adverse events (e.g., hyperglycemia, hyperkalemia, hypomagnesemia, hypertension, headache, tremor)    Results:   Tacrolimus level: Not applicable    Pharmacokinetic Considerations and Significant Drug Interactions:  Concurrent hepatotoxic medications: None identified  Concurrent CYP3A4 substrates/inhibitors: None identified  Concurrent nephrotoxic medications: None identified    Assessment/Plan:  Recommendedation(s)  Continue current regimen of 9 mg BID     Follow-up  Next level should be ordered on 7/8  at 0700 .   A pharmacist will continue to monitor and recommend levels as appropriate    Please page service pharmacist with questions/clarifications.    Winferd Humphrey, PharmD

## 2023-02-28 NOTE — Unmapped (Deleted)
Transplant Surgery Consult Note    Requesting Attending Physician:  Marjory Sneddon, MD  Service Requesting Consult:  Emergency Medicine  Service Providing Consult: Transplant  Consulting Attending: Dr. Edwin Dada    Assessment:  Alicia Armstrong is a 47 y.o. 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 Labs were collected in the ED, which showed Tbili to 1.4, AST/ALT 22/41      PLAN:     - Admit to SRF  - MMPC  - NPO, MIVF  - CTA A/P to evaluate transplanted liver vascular supply  - Increase ASA to 325mg  daily  - Continue home immunosuppressives.      Thank you for including [place] in this patient's care. If you have any questions, concerns or changes in the patient's clinical status, please page [facility] consult pager [phone].     If you have any questions, concerns or changes in the patient's clinical status, please feel free to contact [facility] consult pager [phone]. Thank you for this interesting consult.    Maggie Font, MD  General Surgery PGY-2    History of Present Illness:   Chief Complaint:  2 day history of severe b/l posterior flank pain    Alicia Armstrong is a 47 y.o. 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 She is seen in consultation at the request of ED for diffuse lower back pain. She reports that the abdominal pain was present at discharge, but was mild and improved with pain medications. Two days after discharge, there was an acute onset of same pain, but now 11/10 that radiates to the anterior abdomen. She denied nausea, vomiting, or fever. She is afebrile and HDS on room air. LFTs are elevated, notably Tbili to 1.4, but overall stable from time of discharge. She continues on her immunosuppressive medications. Liver doppler with new parvus tardus waveform and blunted systolic peak velocity with R and L hepatic arteries; monophasic waveforms in hepatic veins.       Past Medical History:   Past Medical History:   Diagnosis Date    Cirrhosis (CMS-HCC)        Past Surgical History:  Past Surgical History:   Procedure Laterality Date    CESAREAN SECTION      PR COLSC FLX W/RMVL OF TUMOR POLYP LESION SNARE TQ N/A 09/11/2022    Procedure: COLONOSCOPY FLEX; W/REMOV TUMOR/LES BY SNARE;  Surgeon: Mickie Hillier, MD;  Location: [facility];  Service: Gastroenterology    PR REMV VEIN CLOT CAVA-ILIAC,LEG INCIS N/A 02/14/2023    Procedure: THROMBEC DIRECT OR W/CATH; VENA CAVA BY LEG INCS;  Surgeon: Florene Glen, MD;  Location: MAIN OR [place];  Service: Transplant    PR TRANSPLANT LIVER,ALLOTRANSPLANT Bilateral 02/14/2023    Procedure: LIVER ALLOTRANSPLANTATION; ORTHOTOPIC, PARTIAL OR WHOLE, FROM CADAVER OR LIVING DONOR, ANY AGE;  Surgeon: Florene Glen, MD;  Location: MAIN OR [place];  Service: Transplant    PR TRANSPLANT,PREP DONOR LIVER, WHOLE N/A 02/14/2023    Procedure: [facility] STD PREP CAD DONOR WHOLE LIVER GFT PRIOR TNSPLNT,INC CHOLE,DISS/REM SURR TISSU WO TRISEG/LOBE SPLT;  Surgeon: Florene Glen, MD;  Location: MAIN OR [place];  Service: Transplant    PR UPPER GI ENDOSCOPY,DIAGNOSIS N/A 04/02/2017    Procedure: UGI ENDO, INCLUDE ESOPHAGUS, STOMACH, & DUODENUM &/OR JEJUNUM; DX W/WO COLLECTION  SPECIMN, BY BRUSH OR WASH;  Surgeon: Janyth Pupa, MD;  Location: [facility];  Service: Gastroenterology    PR UPPER GI ENDOSCOPY,DIAGNOSIS N/A 09/11/2022    Procedure: UGI ENDO, INCLUDE ESOPHAGUS, STOMACH, & DUODENUM &/OR JEJUNUM; DX W/WO COLLECTION SPECIMN, BY BRUSH OR WASH;  Surgeon: Mickie Hillier, MD;  Location: [facility];  Service: Gastroenterology       Medications:  No current facility-administered medications on file prior to encounter.     Current Outpatient Medications on File Prior to Encounter Medication Sig Dispense Refill    acetaminophen (TYLENOL) 325 MG tablet Take 1-2 tablets (325-650 mg total) by mouth every eight (8) hours as needed for pain or fever. 60 tablet 11    aspirin (ECOTRIN) 81 MG tablet Take 1 tablet (81 mg total) by mouth daily. 30 tablet 11    blood sugar diagnostic (GLUCOSE BLOOD) Strp Use to check blood sugar as directed with insulin 3 times a day & for symptoms of high or low blood sugar. 100 strip 0    blood-glucose meter kit Use as instructed 1 each 0    blood-glucose meter Misc Use as directed 1 each 0    calcium carbonate (TUMS) 200 mg calcium (500 mg) chewable tablet Chew 1 tablet (500 mg total) Three (3) times a day. 150 tablet 0    docusate sodium (COLACE) 100 MG capsule Take 1 capsule (100 mg total) by mouth two (2) times a day as needed for constipation. 60 capsule 11    furosemide (LASIX) 20 MG tablet Take 1 tablet (20 mg total) by mouth daily. Stop on Monday, 7/8 30 tablet 0    gabapentin (NEURONTIN) 400 MG capsule Take 1 capsule (400 mg total) by mouth Three (3) times a day. 90 capsule 3    insulin lispro (HUMALOG) 100 unit/mL injection pen Inject 0-10 Units under the skin Three (3) times a day before meals. Inject 1 unit for every 50 mg/dL over 161 mg/dL before meals 15 mL 0    lancets Misc Use to check blood sugar as directed with insulin 3 times a day & for symptoms of high or low blood sugar. 100 each 0    magnesium oxide-Mg AA chelate (MAGNESIUM, AMINO ACID CHELATE,) 133 mg (Hold until directed to take by nurse coordinator)  Take 1 tablet by mouth two (2) times a day. 100 tablet 6    methocarbamol (ROBAXIN) 500 MG tablet Take 1 tablet (500 mg total) by mouth Three (3) times a day as needed. 90 tablet 11    mycophenolate (CELLCEPT) 250 mg capsule Take 1 capsule (250 mg total) by mouth two (2) times a day. 60 capsule 11    oxyCODONE (ROXICODONE) 5 MG immediate release tablet Take 1 tablet (5 mg total) by mouth every four (4) hours as needed for pain 10 tablet 0 pantoprazole (PROTONIX) 40 MG tablet Take 1 tablet (40 mg total) by mouth daily. 30 tablet 11    pen needle, diabetic 32 gauge x 5/32 (4 mm) Ndle Use with insulin up to 4 times/day as needed. 100 each 0    [START ON 03/20/2023] pentamidine in sterile water Inhale 6 mL (300 mg total) every twenty-eight (28) days.      polyethylene glycol (MIRALAX) 17 gram packet Mix 1 packet in  4 to 8 ounces of liquid and drink daily as needed. 30 packet 11    predniSONE (DELTASONE) 5 MG tablet Take 1 tablet (5 mg total) by mouth daily. 30  tablet 11    SITagliptin phosphate (JANUVIA) 100 MG tablet Take 1 tablet (100 mg total) by mouth daily. 90 tablet 3    tacrolimus (PROGRAF) 1 MG capsule Take 9 capsules (9 mg total) by mouth two (2) times a day. 600 capsule 11    valGANciclovir (VALCYTE) 450 mg tablet Take 1 tablet (450 mg total) by mouth daily. 30 tablet 2       Allergies:  Allergies   Allergen Reactions    Penicillins Other (See Comments)     Had a rash with penicillin when she was 47 years old. She tolerated piperacillin/tazobactam 08/25/2016-08/28/2016 with itching without rash.       Family History:  Family History   Problem Relation Age of Onset    No Known Problems Mother     Diabetes Father     No Known Problems Sister     No Known Problems Daughter     No Known Problems Maternal Grandmother     No Known Problems Maternal Grandfather     No Known Problems Paternal Grandmother     No Known Problems Paternal Grandfather     BRCA 1/2 Neg Hx     Breast cancer Neg Hx     Cancer Neg Hx     Colon cancer Neg Hx     Endometrial cancer Neg Hx     Ovarian cancer Neg Hx        Social History:   Social History     Tobacco Use    Smoking status: Never    Smokeless tobacco: Never   Vaping Use    Vaping status: Never Used   Substance Use Topics    Alcohol use: No     Alcohol/week: 0.0 standard drinks of alcohol    Drug use: No       Review of Systems  10 systems were reviewed and are negative except as noted specifically in the HPI.    Objective  Vitals:   Temp:  [36.7 ??C (98.1 ??F)] 36.7 ??C (98.1 ??F)  Heart Rate:  [76-85] 76  SpO2 Pulse:  [76-85] 76  Resp:  [16] 16  BP: (115-140)/(66-88) 115/76  MAP (mmHg):  [82-104] 89  SpO2:  [96 %-100 %] 96 %      Intake/Output last 24 hours:  No intake or output data in the 24 hours ending 02/28/23 0316    Physical Exam:    General Appearance: Uncomfortable. Alert and oriented.   Pulmonary: Normal respiratory effort.   Cardiovascular: Normal rate, regular rhythm.   Abdomen: soft, distended, non-tender. Well healed Mercedes incisions with intact staples.  Extremities: warm and well perfused, no edema  Neurologic: No motor abnormalities noted. Sensation grossly intact.  Skin: Skin color normal. No rashes or lesions.    Pertinent Diagnostic Tests:  All lab results last 24 hours:    Recent Results (from the past 24 hour(s))   POCT Glucose    Collection Time: 02/28/23 12:43 PM   Result Value Ref Range    Glucose, POC 121 70 - 179 mg/dL   POCT Glucose    Collection Time: 02/28/23  5:08 PM   Result Value Ref Range    Glucose, POC 134 70 - 179 mg/dL   POCT Glucose    Collection Time: 02/28/23  9:42 PM   Result Value Ref Range    Glucose, POC 176 70 - 179 mg/dL   Basic Metabolic Panel    Collection Time: 03/01/23  4:26 AM   Result  Value Ref Range    Sodium 139 135 - 145 mmol/L    Potassium 4.4 3.4 - 4.8 mmol/L    Chloride 106 98 - 107 mmol/L    CO2 28.0 20.0 - 31.0 mmol/L    Anion Gap 5 5 - 14 mmol/L    BUN 20 9 - 23 mg/dL    Creatinine 0.98 1.19 - 1.02 mg/dL    BUN/Creatinine Ratio 22     eGFR CKD-EPI (2021) Female 80 >=60 mL/min/1.42m2    Glucose 101 70 - 179 mg/dL    Calcium 7.7 (L) 8.7 - 10.4 mg/dL   Magnesium Level    Collection Time: 03/01/23  4:26 AM   Result Value Ref Range    Magnesium 1.6 1.6 - 2.6 mg/dL   Phosphorus Level    Collection Time: 03/01/23  4:26 AM   Result Value Ref Range    Phosphorus 4.3 2.4 - 5.1 mg/dL   Hepatic Function Panel    Collection Time: 03/01/23  4:26 AM   Result Value Ref Range    Albumin 2.6 (L) 3.4 - 5.0 g/dL    Total Protein 4.7 (L) 5.7 - 8.2 g/dL    Total Bilirubin 1.4 (H) 0.3 - 1.2 mg/dL    Bilirubin, Direct 1.47 (H) 0.00 - 0.30 mg/dL    AST 18 <=82 U/L    ALT 33 10 - 49 U/L    Alkaline Phosphatase 95 46 - 116 U/L   CBC w/ Differential    Collection Time: 03/01/23  4:26 AM   Result Value Ref Range    WBC 2.5 (L) 3.6 - 11.2 10*9/L    RBC 2.38 (L) 3.95 - 5.13 10*12/L    HGB 7.7 (L) 11.3 - 14.9 g/dL    HCT 95.6 (L) 21.3 - 44.0 %    MCV 92.4 77.6 - 95.7 fL    MCH 32.2 25.9 - 32.4 pg    MCHC 34.9 32.0 - 36.0 g/dL    RDW 08.6 (H) 57.8 - 15.2 %    MPV 8.0 6.8 - 10.7 fL    Platelet 103 (L) 150 - 450 10*9/L    Neutrophils % 76.7 %    Lymphocytes % 12.4 %    Monocytes % 6.9 %    Eosinophils % 2.9 %    Basophils % 1.1 %    Absolute Neutrophils 1.9 1.8 - 7.8 10*9/L    Absolute Lymphocytes 0.3 (L) 1.1 - 3.6 10*9/L    Absolute Monocytes 0.2 (L) 0.3 - 0.8 10*9/L    Absolute Eosinophils 0.1 0.0 - 0.5 10*9/L    Absolute Basophils 0.0 0.0 - 0.1 10*9/L    Anisocytosis Slight (A) Not Present   POCT Glucose    Collection Time: 03/01/23  6:02 AM   Result Value Ref Range    Glucose, POC 109 70 - 179 mg/dL       Imaging:    US Liver Transplant     Result Date: 02/28/2023  EXAM: US LIVER TRANSPLANT ACCESSION: [id number] UN CLINICAL INDICATION: 47 years old with Abdominal pain s/p OLT  COMPARISON: 02/16/23 TECHNIQUE: Ultrasound views of the complete abdomen were obtained using gray scale and color and spectral Doppler imaging. FINDINGS: HEPATOBILIARY: The liver is normal in echogenicity. No focal hepatic lesions. No intrahepatic biliary ductal dilatation. The common bile duct is normal in caliber. The gallbladder is surgically absent.      Liver: 15.7 cm      Common bile duct: Not visualized. PANCREAS: Not well seen due to  overlying bowel gas. SPLEEN: Normal in size and echotexture.       Spleen: 13.9 cm KIDNEYS: Normal in size and echotexture. No hydronephrosis.      Right kidney: 11.0 cm      Left kidney: 10.5 cm VESSELS: - Portal vein: The main, left and right portal veins are patent with hepatopetal flow. Normal main portal vein velocity (0.20 m/s or greater)      Main portal vein diameter: 1.0 cm      Main portal vein pre anastomosis velocity: 0.31 m/s      Main portal vein anastomosis velocity: 0.73 m/s      Main portal vein post anastomosis velocity: 1.0 m/s      Anterior right portal vein velocity: 0.17 m/s      Posterior right portal vein velocity: 0.27 m/s      Left portal vein velocity: 0.33 m/s      Right portal vein flow: hepatopetal      Left portal vein flow: hepatopetal - Splenic vein: Patent, with hepatopetal flow.      Splenic vein midline: hepatopetal      Splenic vein proximal: hepatopetal - Hepatic veins/IVC: The IVC, left, middle and right hepatic veins are were patent.      Left hepatic vein phasicity/flow: monophasic      Middle hepatic vein phasicity/flow: monophasic      Right hepatic vein phasicity/flow: monophasic      Inferior vena cava phasicity/flow: mono-bi - Hepatic artery: Parvus tardus waveform, low resistive indices, and blunted systolic peak velocity in the right and left hepatic arteries. There is borderline low resistive index and mild parvus tardus waveform within the common hepatic artery.           Common hepatic artery resistive index: 0.62 and systolic acceleration time 2 msec      Right hepatic artery resistive index: 0.55 and systolic acceleration time 7 msec      Left hepatic artery resistive index: 0.32 and systolic acceleration time 3 msec - Visualized proximal aorta:  unremarkable OTHER: Mild to moderate volume abdominopelvic ascites.      New parvus tardus waveform, low RI, and blunted systolic peak velocity within the right and left hepatic arteries, with borderline findings of the common hepatic artery. Finding is concerning for thrombosis or stenosis at the level of the common hepatic artery. Monophasic waveforms of the hepatic veins, new from prior. The findings of this study were discussed via telephone with DR. Domenick Bookbinder by Dr. Steva Colder on 02/28/2023 5:43 AM.      ECG 12 Lead     Result Date: 02/28/2023  NORMAL SINUS RHYTHM NORMAL ECG WHEN COMPARED WITH ECG OF 16-Feb-2023 11:36, QT HAS SHORTENED

## 2023-03-01 DIAGNOSIS — Z944 Liver transplant status: Principal | ICD-10-CM

## 2023-03-01 LAB — CBC W/ AUTO DIFF
BASOPHILS ABSOLUTE COUNT: 0 10*9/L (ref 0.0–0.1)
BASOPHILS RELATIVE PERCENT: 1.1 %
EOSINOPHILS ABSOLUTE COUNT: 0.1 10*9/L (ref 0.0–0.5)
EOSINOPHILS RELATIVE PERCENT: 2.9 %
HEMATOCRIT: 22 % — ABNORMAL LOW (ref 34.0–44.0)
HEMOGLOBIN: 7.7 g/dL — ABNORMAL LOW (ref 11.3–14.9)
LYMPHOCYTES ABSOLUTE COUNT: 0.3 10*9/L — ABNORMAL LOW (ref 1.1–3.6)
LYMPHOCYTES RELATIVE PERCENT: 12.4 %
MEAN CORPUSCULAR HEMOGLOBIN CONC: 34.9 g/dL (ref 32.0–36.0)
MEAN CORPUSCULAR HEMOGLOBIN: 32.2 pg (ref 25.9–32.4)
MEAN CORPUSCULAR VOLUME: 92.4 fL (ref 77.6–95.7)
MEAN PLATELET VOLUME: 8 fL (ref 6.8–10.7)
MONOCYTES ABSOLUTE COUNT: 0.2 10*9/L — ABNORMAL LOW (ref 0.3–0.8)
MONOCYTES RELATIVE PERCENT: 6.9 %
NEUTROPHILS ABSOLUTE COUNT: 1.9 10*9/L (ref 1.8–7.8)
NEUTROPHILS RELATIVE PERCENT: 76.7 %
PLATELET COUNT: 103 10*9/L — ABNORMAL LOW (ref 150–450)
RED BLOOD CELL COUNT: 2.38 10*12/L — ABNORMAL LOW (ref 3.95–5.13)
RED CELL DISTRIBUTION WIDTH: 18.7 % — ABNORMAL HIGH (ref 12.2–15.2)
WBC ADJUSTED: 2.5 10*9/L — ABNORMAL LOW (ref 3.6–11.2)

## 2023-03-01 LAB — HEPATIC FUNCTION PANEL
ALBUMIN: 2.6 g/dL — ABNORMAL LOW (ref 3.4–5.0)
ALKALINE PHOSPHATASE: 95 U/L (ref 46–116)
ALT (SGPT): 33 U/L (ref 10–49)
AST (SGOT): 18 U/L (ref <=34)
BILIRUBIN DIRECT: 0.7 mg/dL — ABNORMAL HIGH (ref 0.00–0.30)
BILIRUBIN TOTAL: 1.4 mg/dL — ABNORMAL HIGH (ref 0.3–1.2)
PROTEIN TOTAL: 4.7 g/dL — ABNORMAL LOW (ref 5.7–8.2)

## 2023-03-01 LAB — BASIC METABOLIC PANEL
ANION GAP: 5 mmol/L (ref 5–14)
BLOOD UREA NITROGEN: 20 mg/dL (ref 9–23)
BUN / CREAT RATIO: 22
CALCIUM: 7.7 mg/dL — ABNORMAL LOW (ref 8.7–10.4)
CHLORIDE: 106 mmol/L (ref 98–107)
CO2: 28 mmol/L (ref 20.0–31.0)
CREATININE: 0.9 mg/dL
EGFR CKD-EPI (2021) FEMALE: 80 mL/min/{1.73_m2} (ref >=60)
GLUCOSE RANDOM: 101 mg/dL (ref 70–179)
POTASSIUM: 4.4 mmol/L (ref 3.4–4.8)
SODIUM: 139 mmol/L (ref 135–145)

## 2023-03-01 LAB — MAGNESIUM: MAGNESIUM: 1.6 mg/dL (ref 1.6–2.6)

## 2023-03-01 LAB — TACROLIMUS LEVEL, TIMED: TACROLIMUS BLOOD: 11.4 ng/mL

## 2023-03-01 LAB — PHOSPHORUS: PHOSPHORUS: 4.3 mg/dL (ref 2.4–5.1)

## 2023-03-01 MED ORDER — ENOXAPARIN 30 MG/0.3 ML SUBCUTANEOUS SYRINGE
Freq: Two times a day (BID) | SUBCUTANEOUS | 0 refills | 30 days
Start: 2023-03-01 — End: 2023-03-01

## 2023-03-01 MED ADMIN — magnesium sulfate 2gm/50mL IVPB: 2 g | INTRAVENOUS | @ 16:00:00 | Stop: 2023-03-01

## 2023-03-01 MED ADMIN — enoxaparin (LOVENOX) syringe 30 mg: 30 mg | SUBCUTANEOUS | @ 12:00:00

## 2023-03-01 MED ADMIN — docusate sodium (COLACE) capsule 100 mg: 100 mg | ORAL | @ 15:00:00

## 2023-03-01 MED ADMIN — methocarbamol (ROBAXIN) tablet 500 mg: 500 mg | ORAL | @ 05:00:00 | Stop: 2023-03-01

## 2023-03-01 MED ADMIN — methocarbamol (ROBAXIN) tablet 500 mg: 500 mg | ORAL | @ 18:00:00

## 2023-03-01 MED ADMIN — polyethylene glycol (MIRALAX) packet 17 g: 17 g | ORAL | @ 15:00:00

## 2023-03-01 MED ADMIN — gabapentin (NEURONTIN) capsule 400 mg: 400 mg | ORAL | @ 12:00:00

## 2023-03-01 MED ADMIN — calcium carbonate (TUMS) chewable tablet 200 mg elem calcium: 200 mg | ORAL | @ 12:00:00

## 2023-03-01 MED ADMIN — oxyCODONE (ROXICODONE) immediate release tablet 10 mg: 10 mg | ORAL | @ 05:00:00 | Stop: 2023-03-14

## 2023-03-01 MED ADMIN — mycophenolate (CELLCEPT) capsule 250 mg: 250 mg | ORAL | @ 10:00:00

## 2023-03-01 MED ADMIN — oxyCODONE (ROXICODONE) immediate release tablet 5 mg: 5 mg | ORAL | @ 01:00:00 | Stop: 2023-03-14

## 2023-03-01 MED ADMIN — methocarbamol (ROBAXIN) tablet 500 mg: 500 mg | ORAL | @ 01:00:00

## 2023-03-01 MED ADMIN — tacrolimus (PROGRAF) capsule 7 mg: 7 mg | ORAL | @ 10:00:00

## 2023-03-01 MED ADMIN — gabapentin (NEURONTIN) capsule 400 mg: 400 mg | ORAL | @ 18:00:00

## 2023-03-01 MED ADMIN — gabapentin (NEURONTIN) capsule 400 mg: 400 mg | ORAL | @ 01:00:00

## 2023-03-01 MED ADMIN — insulin lispro (HumaLOG) injection 0-20 Units: 0-20 [IU] | SUBCUTANEOUS | @ 02:00:00

## 2023-03-01 MED ADMIN — pantoprazole (Protonix) EC tablet 40 mg: 40 mg | ORAL | @ 12:00:00

## 2023-03-01 MED ADMIN — valGANciclovir (VALCYTE) tablet 450 mg: 450 mg | ORAL | @ 12:00:00

## 2023-03-01 MED ADMIN — mycophenolate (CELLCEPT) capsule 250 mg: 250 mg | ORAL | @ 01:00:00

## 2023-03-01 MED ADMIN — spironolactone (ALDACTONE) tablet 50 mg: 50 mg | ORAL | @ 15:00:00

## 2023-03-01 MED ADMIN — aspirin chewable tablet 81 mg: 81 mg | ORAL | @ 12:00:00

## 2023-03-01 MED ADMIN — furosemide (LASIX) tablet 20 mg: 20 mg | ORAL | @ 15:00:00

## 2023-03-01 MED ADMIN — methocarbamol (ROBAXIN) tablet 500 mg: 500 mg | ORAL | @ 12:00:00

## 2023-03-01 MED ADMIN — magnesium oxide-Mg AA chelate (Magnesium Plus Protein) 1 tablet: 1 | ORAL | @ 12:00:00

## 2023-03-01 MED ADMIN — tacrolimus (PROGRAF) capsule 7 mg: 7 mg | ORAL | @ 22:00:00

## 2023-03-01 MED ADMIN — lactated Ringers infusion: 10 mL/h | INTRAVENOUS | @ 10:00:00

## 2023-03-01 MED ADMIN — predniSONE (DELTASONE) tablet 5 mg: 5 mg | ORAL | @ 12:00:00

## 2023-03-01 MED ADMIN — calcium carbonate (TUMS) chewable tablet 200 mg elem calcium: 200 mg | ORAL | @ 18:00:00

## 2023-03-01 MED ADMIN — magnesium sulfate 2gm/50mL IVPB: 2 g | INTRAVENOUS | @ 14:00:00 | Stop: 2023-03-01

## 2023-03-01 MED ADMIN — insulin lispro (HumaLOG) injection 0-20 Units: 0-20 [IU] | SUBCUTANEOUS | @ 18:00:00

## 2023-03-01 NOTE — Unmapped (Signed)
Patient arrived to the unit from the ED around 1345. A&O x4. Vital signs stable on room air. OOB independently/with assistance of husband numerous times this shift. Patient was NPO, then was changed to a regular diet. Tolerating regular diet - no complaints of N/V. Adequate urine output. No BM, though patient reports passing gas. Patient reports back pain - two doses 5mg  oxycodone administered with adequate relief. Plan of care reviewed - patient with no further questions or concerns at this time.     Problem: Adult Inpatient Plan of Care  Goal: Plan of Care Review  Outcome: Ongoing - Unchanged  Goal: Patient-Specific Goal (Individualized)  Outcome: Ongoing - Unchanged  Goal: Absence of Hospital-Acquired Illness or Injury  Outcome: Ongoing - Unchanged  Intervention: Prevent and Manage VTE (Venous Thromboembolism) Risk  Recent Flowsheet Documentation  Taken 02/28/2023 1600 by Yevette Edwards, RN  Anti-Embolism Device Type: SCD, Knee  Anti-Embolism Intervention: Refused  Anti-Embolism Device Location: BLE  Taken 02/28/2023 1400 by Landry Lookingbill, Jordan Hawks, RN  Anti-Embolism Device Type: SCD, Knee  Anti-Embolism Intervention: Refused  Anti-Embolism Device Location: BLE  Goal: Optimal Comfort and Wellbeing  Outcome: Ongoing - Unchanged  Goal: Readiness for Transition of Care  Outcome: Ongoing - Unchanged  Goal: Rounds/Family Conference  Outcome: Ongoing - Unchanged     Problem: Wound  Goal: Optimal Coping  Outcome: Ongoing - Unchanged  Goal: Optimal Functional Ability  Outcome: Ongoing - Unchanged  Intervention: Optimize Functional Ability  Recent Flowsheet Documentation  Taken 02/28/2023 1600 by Yevette Edwards, RN  Activity Management: up ad lib  Taken 02/28/2023 1400 by Yevette Edwards, RN  Activity Management: ambulated in room  Goal: Absence of Infection Signs and Symptoms  Outcome: Ongoing - Unchanged  Goal: Improved Oral Intake  Outcome: Ongoing - Unchanged  Goal: Optimal Pain Control and Function  Outcome: Ongoing - Unchanged  Goal: Skin Health and Integrity  Outcome: Ongoing - Unchanged  Intervention: Optimize Skin Protection  Recent Flowsheet Documentation  Taken 02/28/2023 1600 by Yevette Edwards, RN  Activity Management: up ad lib  Taken 02/28/2023 1400 by Yevette Edwards, RN  Activity Management: ambulated in room  Goal: Optimal Wound Healing  Outcome: Ongoing - Unchanged

## 2023-03-01 NOTE — Unmapped (Shared)
Transplant Surgery Progress Note    Hospital Day: 3    Assessment:     Alicia Armstrong is a 47 y.o. 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 Celine Mans. Induction with Methylpred. She presented to the ED on 7/6 due to worsening back pain uncontrolled by pain meds. Liver US showed new parvus tardus waveform, low RI, and blunted systolic peak velocity within R and L hepatic arteries with borderline findings of common, concerning for thrombosis or stenosis at the common hepatic artery. However, patient's liver studies are reassuring for good liver function. Patient notes most improvement of pain with robaxin, which is reassuring for musculoskeletal etiology.    Interval Events:     No acute events overnight. Vital signs are stable. Urine output is appropriate.     Plan:     Neuro:   - Pain well controlled    CV:   - HDS, Maintain SBP < 180    Pulm:   - Stable on room air  Continue incentive spirometry, pulmonary toilet, out of bed as tolerated    MSK   - pain well controlled with robaxin   - schedule robaxin q6h    GI:   - F: [place]   - E: [place] as needed   - N: regular diet   - pepcid/pantoprazole, zofran, colace, miralax    GU:   - UA on 7/6 negative for UTI    Endo:   - NPH, mealtime, SSI    Heme/ID:   - Afebrile, WBC and Hgb stable   - ppx with nystatin, valcyte, bactrim   - Increase Lovenox to 30mg  BID   - PT/INR tomorrow    Immuno:   - tac, cellcept, pred taper  - pharmacy recommendations for dosing      Dispo   - Floor      Objective:        Vital Signs:  BP 123/75  - Pulse 87  - Temp 37 ??C (98.6 ??F) (Oral)  - Resp 16  - Ht 154.9 cm (5' 0.98)  - Wt 75.6 kg (166 lb 10.7 oz)  - LMP 11/02/2022 (Approximate)  - SpO2 100%  - BMI 31.51 kg/m??     Input/Output:  I/O last 3 completed shifts:  In: 1745.8 [P.O.:200; I.V.:1545.8]  Out: 200 [Urine:200]    Physical Exam:    General: Cooperative, no distress, well appearing female  Pulmonary: Normal work of breathing, on room air  Cardiovascular: Regular rate and rhythm  Abdomen: Soft, non-tender, non-distended. Surgical incisions and drain sites without signs of infection. Well healing.   Musculoskeletal: Moves extremities spontaneously   Neurologic: Alert and interactive, grossly intact    Labs:  Lab Results   Component Value Date    WBC 2.5 (L) 03/01/2023    HGB 7.7 (L) 03/01/2023    HCT 22.0 (L) 03/01/2023    PLT 103 (L) 03/01/2023       Lab Results   Component Value Date    NA 135 02/28/2023    K 4.5 02/28/2023    CL 105 02/28/2023    CO2 28.0 02/28/2023    BUN 22 02/28/2023    CREATININE 0.94 02/28/2023    CALCIUM 7.9 (L) 02/28/2023    MG 1.5 (L) 02/28/2023    PHOS 4.8 02/28/2023       Microbiology Results (last day)       ** No results found for the  last 24 hours. **            Imaging:  All pertinent imaging personally reviewed.     Ronni Rumble, MS3    ***

## 2023-03-01 NOTE — Unmapped (Signed)
Received sign out from previous provider.    Patient Summary: Alicia Armstrong is a 47 y.o. female who  has a past medical history of Cirrhosis (CMS-HCC).  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   Action List:   Observe and manage in interim awaiting surgery team transfer    Updates    Noted to be resting comfortably in bed.   Provided info to family regarding plan.   Patient transferred to surgery in stable condition.        Tawanna Solo, MD  Resident  02/28/23 [phone]

## 2023-03-01 NOTE — Unmapped (Addendum)
Medicated with oxycodone and robaxin for persistent back pain. Pt reports that muscle relaxer is more effective. However she is requesting a muscle relaxer every 4 hours. Out of bed independently to bathroom.  Urine output is amber in color. IV fluids continue. Tolerating regular diet without nausea. Abdomen rounded and soft with umbilical hernia present.   Problem: Adult Inpatient Plan of Care  Goal: Plan of Care Review  Outcome: Progressing  Goal: Patient-Specific Goal (Individualized)  Outcome: Progressing  Goal: Absence of Hospital-Acquired Illness or Injury  Outcome: Progressing  Intervention: Identify and Manage Fall Risk  Recent Flowsheet Documentation  Taken 03/01/2023 0400 by Sherril Cong, RN  Safety Interventions:   low bed   isolation precautions   neutropenic precautions   nonskid shoes/slippers when out of bed  Taken 03/01/2023 0000 by Sherril Cong, RN  Safety Interventions:   low bed   isolation precautions   neutropenic precautions   nonskid shoes/slippers when out of bed  Taken 02/28/2023 2200 by Sherril Cong, RN  Safety Interventions:   low bed   isolation precautions   neutropenic precautions   nonskid shoes/slippers when out of bed  Taken 02/28/2023 2000 by Sherril Cong, RN  Safety Interventions:   low bed   isolation precautions   neutropenic precautions   nonskid shoes/slippers when out of bed  Intervention: Prevent Skin Injury  Recent Flowsheet Documentation  Taken 03/01/2023 0400 by Sherril Cong, RN  Positioning for Skin: Supine/Back  Taken 03/01/2023 0000 by Sherril Cong, RN  Positioning for Skin: Supine/Back  Taken 02/28/2023 2200 by Sherril Cong, RN  Positioning for Skin: Supine/Back  Taken 02/28/2023 2000 by Sherril Cong, RN  Positioning for Skin: Supine/Back  Intervention: Prevent and Manage VTE (Venous Thromboembolism) Risk  Recent Flowsheet Documentation  Taken 02/28/2023 2045 by Sherril Cong, RN  VTE Prevention/Management: ambulation promoted  Goal: Optimal Comfort and Wellbeing  Outcome: Progressing  Goal: Readiness for Transition of Care  Outcome: Progressing  Goal: Rounds/Family Conference  Outcome: Progressing     Problem: Wound  Goal: Optimal Coping  Outcome: Progressing  Goal: Optimal Functional Ability  Outcome: Progressing  Intervention: Optimize Functional Ability  Recent Flowsheet Documentation  Taken 03/01/2023 0400 by Sherril Cong, RN  Activity Management: up ad lib  Taken 03/01/2023 0000 by Sherril Cong, RN  Activity Management: up ad lib  Taken 02/28/2023 2200 by Sherril Cong, RN  Activity Management: up ad lib  Taken 02/28/2023 2000 by Sherril Cong, RN  Activity Management: up ad lib  Goal: Absence of Infection Signs and Symptoms  Outcome: Progressing  Intervention: Prevent or Manage Infection  Recent Flowsheet Documentation  Taken 03/01/2023 0400 by Sherril Cong, RN  Isolation Precautions: protective precautions maintained  Taken 03/01/2023 0000 by Sherril Cong, RN  Isolation Precautions: protective precautions maintained  Taken 02/28/2023 2200 by Sherril Cong, RN  Isolation Precautions: protective precautions maintained  Taken 02/28/2023 2000 by Sherril Cong, RN  Isolation Precautions: protective precautions maintained  Goal: Improved Oral Intake  Outcome: Progressing  Goal: Optimal Pain Control and Function  Outcome: Progressing  Goal: Skin Health and Integrity  Outcome: Progressing  Intervention: Optimize Skin Protection  Recent Flowsheet Documentation  Taken 03/01/2023 0400 by Sherril Cong, RN  Activity Management: up ad lib  Pressure Reduction Techniques: frequent weight shift encouraged  Pressure Reduction Devices: pressure-redistributing mattress utilized  Taken 03/01/2023 0000 by Sherril Cong, RN  Activity Management: up ad  lib  Pressure Reduction Techniques: frequent weight shift encouraged  Pressure Reduction Devices: pressure-redistributing mattress utilized  Taken 02/28/2023 2200 by Sherril Cong, RN  Activity Management: up ad lib  Pressure Reduction Techniques: frequent weight shift encouraged  Pressure Reduction Devices: pressure-redistributing mattress utilized  Taken 02/28/2023 2000 by Sherril Cong, RN  Activity Management: up ad lib  Pressure Reduction Techniques: frequent weight shift encouraged  Pressure Reduction Devices: pressure-redistributing mattress utilized  Goal: Optimal Wound Healing  Outcome: Progressing

## 2023-03-01 NOTE — Unmapped (Signed)
Transplant Surgery Progress Note    Hospital Day: 3    Assessment:     Alicia Armstrong is a 47 y.o. 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 Celine Mans. She was readmitted on 02/28/23 d/t worsening back pain traveling to her abdomen along w/ elevated tbili.     Interval Events:     No acute events overnight. Pain is well controlled.  Vital signs are stable. Urine output is appropriate. Pain is mostly helped w/ Robaxin. CT spines negative, started on Lovenox 30 BID 7/7.    Plan:     Neuro:   - Pain well controlled   - Scheduled Robaxin   - PRN oxycodone 5 and 10    CV:   - HDS, Maintain SBP < 180   - Follow echo 7/8    Pulm:   - Stable on room air  Continue incentive spirometry, pulmonary toilet, out of bed as tolerated    GI:   - F: ML   - E: Replete as needed   - N: regular diet   - pantoprazole, miralax, colace    *Low back pain iso recent OLT:   - Liver US 7/7: New parvus tardus waveform, low RI, and blunted systolic peak velocity within the right and left hepatic arteries    - CT Lumbar and Thoracic spine 7/7: No acute fractures   - CXR 7/7: Normal   - Lovenox 30 BID for 6 weeks, start 7/7   - Scheduled Robaxin for pain   - Diuresis w/ 20 Lasix and 50 Spironolactone daily, start 7/8    GU:   - Voiding per self    Endo:   -glucose checks ACHS    -SSI    Heme/ID:   - Afebrile, WBC and Hgb stable   - ppx with valcyte   - Lovenox 30 BID for 6 weeks w/ aspirin 81 daily, then aspirin 325 daily    Immuno:   - tac, cellcept, prednisone  - pharmacy recommendations for dosing      Dispo   - Floor   - Possible discharge 7/9 if labs normal after starting Lovenox       Objective:        Vital Signs:  BP 119/85  - Pulse 82  - Temp 37.2 ??C (99 ??F) (Oral)  - Resp 17  - Ht 154.9 cm (5' 0.98)  - Wt 75.6 kg (166 lb 10.7 oz)  - LMP 11/02/2022 (Approximate)  - SpO2 98%  - BMI 31.51 kg/m??     Input/Output:  I/O last 3 completed shifts:  In: 2364.6 [P.O.:600; I.V.:1764.6]  Out: 2325 [Urine:2325]    Physical Exam:    General: Cooperative, no distress, well appearing female  Pulmonary: Normal work of breathing, on room air  Cardiovascular: RRR  Abdomen: Soft, non-tender, non-distended. Surgical incisions without signs of infection. Well healing.   Musculoskeletal: Moves extremities spontaneously   Neurologic: Alert and interactive, grossly intact    Labs:  Lab Results   Component Value Date    WBC 2.5 (L) 03/01/2023    HGB 7.7 (L) 03/01/2023    HCT 22.0 (L) 03/01/2023    PLT 103 (L) 03/01/2023       Lab Results   Component Value Date    NA 139 03/01/2023    K 4.4 03/01/2023    CL 106 03/01/2023    CO2 28.0 03/01/2023    BUN 20 03/01/2023  CREATININE 0.90 03/01/2023    CALCIUM 7.7 (L) 03/01/2023    MG 1.6 03/01/2023    PHOS 4.3 03/01/2023       Microbiology Results (last day)       ** No results found for the last 24 hours. **            Imaging:  All pertinent imaging personally reviewed.      Kiki Bivens Ermelinda Das, MD  General Surgery PGY1

## 2023-03-02 DIAGNOSIS — Z944 Liver transplant status: Principal | ICD-10-CM

## 2023-03-02 DIAGNOSIS — Z5181 Encounter for therapeutic drug level monitoring: Principal | ICD-10-CM

## 2023-03-02 DIAGNOSIS — I748 Embolism and thrombosis of other arteries: Principal | ICD-10-CM

## 2023-03-02 LAB — CBC W/ AUTO DIFF
BASOPHILS ABSOLUTE COUNT: 0.1 10*9/L (ref 0.0–0.1)
BASOPHILS RELATIVE PERCENT: 2.3 %
EOSINOPHILS ABSOLUTE COUNT: 0.1 10*9/L (ref 0.0–0.5)
EOSINOPHILS RELATIVE PERCENT: 3.1 %
HEMATOCRIT: 24.8 % — ABNORMAL LOW (ref 34.0–44.0)
HEMOGLOBIN: 8.4 g/dL — ABNORMAL LOW (ref 11.3–14.9)
LYMPHOCYTES ABSOLUTE COUNT: 0.3 10*9/L — ABNORMAL LOW (ref 1.1–3.6)
LYMPHOCYTES RELATIVE PERCENT: 11.5 %
MEAN CORPUSCULAR HEMOGLOBIN CONC: 33.7 g/dL (ref 32.0–36.0)
MEAN CORPUSCULAR HEMOGLOBIN: 31.3 pg (ref 25.9–32.4)
MEAN CORPUSCULAR VOLUME: 92.9 fL (ref 77.6–95.7)
MEAN PLATELET VOLUME: 7.8 fL (ref 6.8–10.7)
MONOCYTES ABSOLUTE COUNT: 0.1 10*9/L — ABNORMAL LOW (ref 0.3–0.8)
MONOCYTES RELATIVE PERCENT: 5.7 %
NEUTROPHILS ABSOLUTE COUNT: 1.9 10*9/L (ref 1.8–7.8)
NEUTROPHILS RELATIVE PERCENT: 77.4 %
PLATELET COUNT: 126 10*9/L — ABNORMAL LOW (ref 150–450)
RED BLOOD CELL COUNT: 2.67 10*12/L — ABNORMAL LOW (ref 3.95–5.13)
RED CELL DISTRIBUTION WIDTH: 18.2 % — ABNORMAL HIGH (ref 12.2–15.2)
WBC ADJUSTED: 2.5 10*9/L — ABNORMAL LOW (ref 3.6–11.2)

## 2023-03-02 LAB — PHOSPHORUS: PHOSPHORUS: 3.6 mg/dL (ref 2.4–5.1)

## 2023-03-02 LAB — BASIC METABOLIC PANEL
ANION GAP: 5 mmol/L (ref 5–14)
BLOOD UREA NITROGEN: 15 mg/dL (ref 9–23)
BUN / CREAT RATIO: 16
CALCIUM: 8.7 mg/dL (ref 8.7–10.4)
CHLORIDE: 103 mmol/L (ref 98–107)
CO2: 30 mmol/L (ref 20.0–31.0)
CREATININE: 0.91 mg/dL
EGFR CKD-EPI (2021) FEMALE: 79 mL/min/{1.73_m2} (ref >=60)
GLUCOSE RANDOM: 150 mg/dL (ref 70–179)
POTASSIUM: 4.3 mmol/L (ref 3.4–4.8)
SODIUM: 138 mmol/L (ref 135–145)

## 2023-03-02 LAB — HEPATIC FUNCTION PANEL
ALBUMIN: 3 g/dL — ABNORMAL LOW (ref 3.4–5.0)
ALKALINE PHOSPHATASE: 99 U/L (ref 46–116)
ALT (SGPT): 32 U/L (ref 10–49)
AST (SGOT): 17 U/L (ref <=34)
BILIRUBIN DIRECT: 0.6 mg/dL — ABNORMAL HIGH (ref 0.00–0.30)
BILIRUBIN TOTAL: 1.1 mg/dL (ref 0.3–1.2)
PROTEIN TOTAL: 5.6 g/dL — ABNORMAL LOW (ref 5.7–8.2)

## 2023-03-02 LAB — TACROLIMUS LEVEL, TIMED: TACROLIMUS BLOOD: 15.4 ng/mL

## 2023-03-02 LAB — MAGNESIUM: MAGNESIUM: 2.2 mg/dL (ref 1.6–2.6)

## 2023-03-02 LAB — LMWH ANTI-XA: HEPARIN LOW MOLECULAR WEIGHT: 0.08 [IU]/mL

## 2023-03-02 MED ORDER — MG-PLUS-PROTEIN 133 MG TABLET
ORAL_TABLET | Freq: Two times a day (BID) | ORAL | 6 refills | 50 days | Status: CP
Start: 2023-03-02 — End: ?
  Filled 2023-03-02: qty 100, 50d supply, fill #0

## 2023-03-02 MED ORDER — DOCUSATE SODIUM 100 MG CAPSULE
ORAL_CAPSULE | Freq: Two times a day (BID) | ORAL | 0 refills | 10 days | Status: CP
Start: 2023-03-02 — End: 2023-03-02

## 2023-03-02 MED ORDER — TACROLIMUS 1 MG CAPSULE, IMMEDIATE-RELEASE: mg | ORAL | 11 refills | 50 days | Status: CP

## 2023-03-02 MED ORDER — ENOXAPARIN 30 MG/0.3 ML SUBCUTANEOUS SYRINGE
Freq: Two times a day (BID) | SUBCUTANEOUS | 2 refills | 30 days | Status: CP
Start: 2023-03-02 — End: ?
  Filled 2023-03-02: qty 18, 30d supply, fill #0

## 2023-03-02 MED ORDER — METHOCARBAMOL 500 MG TABLET
ORAL_TABLET | Freq: Three times a day (TID) | ORAL | 0 refills | 5 days | Status: CP
Start: 2023-03-02 — End: 2023-03-02

## 2023-03-02 MED ADMIN — methocarbamol (ROBAXIN) tablet 500 mg: 500 mg | ORAL | @ 09:00:00 | Stop: 2023-03-02

## 2023-03-02 MED ADMIN — insulin lispro (HumaLOG) injection 0-20 Units: 0-20 [IU] | SUBCUTANEOUS | @ 09:00:00 | Stop: 2023-03-02

## 2023-03-02 MED ADMIN — furosemide (LASIX) tablet 20 mg: 20 mg | ORAL | @ 13:00:00 | Stop: 2023-03-02

## 2023-03-02 MED ADMIN — tacrolimus (PROGRAF) capsule 7 mg: 7 mg | ORAL | @ 09:00:00 | Stop: 2023-03-02

## 2023-03-02 MED ADMIN — polyethylene glycol (MIRALAX) packet 17 g: 17 g | ORAL | @ 13:00:00 | Stop: 2023-03-02

## 2023-03-02 MED ADMIN — valGANciclovir (VALCYTE) tablet 450 mg: 450 mg | ORAL | @ 13:00:00 | Stop: 2023-03-02

## 2023-03-02 MED ADMIN — calcium carbonate (TUMS) chewable tablet 200 mg elem calcium: 200 mg | ORAL | @ 01:00:00

## 2023-03-02 MED ADMIN — spironolactone (ALDACTONE) tablet 50 mg: 50 mg | ORAL | @ 13:00:00 | Stop: 2023-03-02

## 2023-03-02 MED ADMIN — aspirin chewable tablet 81 mg: 81 mg | ORAL | @ 13:00:00 | Stop: 2023-03-02

## 2023-03-02 MED ADMIN — magnesium oxide-Mg AA chelate (Magnesium Plus Protein) 1 tablet: 1 | ORAL | @ 13:00:00 | Stop: 2023-03-02

## 2023-03-02 MED ADMIN — mycophenolate (CELLCEPT) capsule 250 mg: 250 mg | ORAL | @ 09:00:00 | Stop: 2023-03-02

## 2023-03-02 MED ADMIN — magnesium oxide-Mg AA chelate (Magnesium Plus Protein) 1 tablet: 1 | ORAL | @ 01:00:00

## 2023-03-02 MED ADMIN — mycophenolate (CELLCEPT) capsule 250 mg: 250 mg | ORAL | @ 01:00:00

## 2023-03-02 MED ADMIN — predniSONE (DELTASONE) tablet 5 mg: 5 mg | ORAL | @ 13:00:00 | Stop: 2023-03-02

## 2023-03-02 MED ADMIN — enoxaparin (LOVENOX) syringe 30 mg: 30 mg | SUBCUTANEOUS | @ 13:00:00 | Stop: 2023-03-02

## 2023-03-02 MED ADMIN — docusate sodium (COLACE) capsule 100 mg: 100 mg | ORAL | @ 01:00:00

## 2023-03-02 MED ADMIN — gabapentin (NEURONTIN) capsule 400 mg: 400 mg | ORAL | @ 13:00:00 | Stop: 2023-03-02

## 2023-03-02 MED ADMIN — enoxaparin (LOVENOX) syringe 30 mg: 30 mg | SUBCUTANEOUS | @ 01:00:00

## 2023-03-02 MED ADMIN — docusate sodium (COLACE) capsule 100 mg: 100 mg | ORAL | @ 13:00:00 | Stop: 2023-03-02

## 2023-03-02 MED ADMIN — calcium carbonate (TUMS) chewable tablet 200 mg elem calcium: 200 mg | ORAL | @ 13:00:00 | Stop: 2023-03-02

## 2023-03-02 MED ADMIN — gabapentin (NEURONTIN) capsule 400 mg: 400 mg | ORAL | @ 01:00:00

## 2023-03-02 MED ADMIN — pantoprazole (Protonix) EC tablet 40 mg: 40 mg | ORAL | @ 09:00:00 | Stop: 2023-03-02

## 2023-03-02 MED ADMIN — methocarbamol (ROBAXIN) tablet 500 mg: 500 mg | ORAL | @ 01:00:00

## 2023-03-02 NOTE — Unmapped (Addendum)
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  MELD 3.0: 25 at 02/14/2023 10:47 PM   MELD-Na: 23 at 02/14/2023 10:47 PM   DBD, EBV D+/R+     Discharge Date: 03/02/23    Past Medical History:   Diagnosis Date    Cirrhosis (CMS-HCC)        Immunosuppression regimen:  Tacrolimus 6 mg BID to start on 7/10; goal 8-10 ng/mL  *level from 7/5 was 23.3 ng/mL; held evening dose on admission on 7/7, then restarted tac 7 mg BID on 7/8 AM    Mycophenolate mofetil 250 mg BID    Prednisone 5 mg daily    Antimicrobials during admission:   CMV:  D-/R+   Discharge dose:  Valcyte 450 mg daily  Goal dose: 450 mg daily  End date: 05/18/23  PJP:    Pentamidine x1 on 6/29 (dc'd Bactrim due to leukopenia), could resume Bactrim outpatient if needed; otherwise, will continue pentamidine 300 mg inhalation q28 days  End date: 08/17/23    Medication changes to be instituted upon discharge:  Pertinent new medications:  Heme: Lovenox 30 mg q12hr  GI: continue furosemide 20 mg daily for 5 more days  Lytes: Mag plus protein - 1 tab BID    Educated patient to take methocarbamol and bowel regimen scheduled if needed for pain and constipation. Educated was provided with [place] interpreter.     Suggested monitoring for outpatient follow-up:   Heme: Started Lovenox on 7/8 based on liver US (7/7: New parvus tardus waveform, low RI, and blunted systolic peak velocity within the right and left hepatic arteries). Plan for prophylaxis enoxaparin for six weeks along with aspirin 81 mg daily. Goal anti-Xa of 0.2-0.4. Anti-Xa to be drawn at 1300 today. Please follow-up with anti-Xa levels outpatient and monitor as needed (weekly to start) to ensure in therapeutic range.  IS: Continue to monitor tac levels and adjust the dose as needed; increase MMF as able (reduced for leukopenia)    Vertis Kelch,  PharmD

## 2023-03-02 NOTE — Unmapped (Signed)
AVS/discharge instructions reviewed with pt with Spanish interpreter at bedside. Pt verbalized understanding of all discharge instructions/prescription medications. Lovenox teaching completed, pt was able to return demonstrate Lovenox injections correctly. Medications delivered by [facility] outpatient pharmacy prior to departure. Pt's son providing transportation home to private residence. PIV removed. Otherwise, no pt questions concerns at this time.       Problem: Adult Inpatient Plan of Care  Goal: Plan of Care Review  Outcome: Progressing  Goal: Patient-Specific Goal (Individualized)  Outcome: Progressing  Goal: Absence of Hospital-Acquired Illness or Injury  Outcome: Progressing  Intervention: Identify and Manage Fall Risk  Recent Flowsheet Documentation  Taken 03/02/2023 0800 by Danton Sewer, RN  Safety Interventions:   fall reduction program maintained   low bed  Intervention: Prevent Skin Injury  Recent Flowsheet Documentation  Taken 03/02/2023 0847 by Danton Sewer, RN  Positioning for Skin: Standing  Taken 03/02/2023 0800 by Danton Sewer, RN  Positioning for Skin: Standing  Intervention: Prevent and Manage VTE (Venous Thromboembolism) Risk  Recent Flowsheet Documentation  Taken 03/02/2023 0847 by Danton Sewer, RN  Anti-Embolism Device Type: SCD, Knee  Anti-Embolism Intervention: Refused  Anti-Embolism Device Location: BLE  Taken 03/02/2023 0800 by Danton Sewer, RN  Anti-Embolism Device Type: SCD, Knee  Anti-Embolism Intervention: Refused  Anti-Embolism Device Location: BLE  Goal: Optimal Comfort and Wellbeing  Outcome: Progressing  Goal: Readiness for Transition of Care  Outcome: Progressing  Goal: Rounds/Family Conference  Outcome: Progressing     Problem: Wound  Goal: Optimal Coping  Outcome: Progressing  Goal: Optimal Functional Ability  Outcome: Progressing  Intervention: Optimize Functional Ability  Recent Flowsheet Documentation  Taken 03/02/2023 0800 by Danton Sewer, RN  Activity Management:   ambulated in room   ambulated to bathroom   up ad lib  Goal: Absence of Infection Signs and Symptoms  Outcome: Progressing  Goal: Improved Oral Intake  Outcome: Progressing  Goal: Optimal Pain Control and Function  Outcome: Progressing  Goal: Skin Health and Integrity  Outcome: Progressing  Intervention: Optimize Skin Protection  Recent Flowsheet Documentation  Taken 03/02/2023 0847 by Danton Sewer, RN  Pressure Reduction Techniques:   frequent weight shift encouraged   heels elevated off bed  Pressure Reduction Devices: pressure-redistributing mattress utilized  Taken 03/02/2023 0800 by Danton Sewer, RN  Activity Management:   ambulated in room   ambulated to bathroom   up ad lib  Pressure Reduction Techniques:   frequent weight shift encouraged   heels elevated off bed  Pressure Reduction Devices: pressure-redistributing mattress utilized  Goal: Optimal Wound Healing  Outcome: Progressing

## 2023-03-02 NOTE — Unmapped (Signed)
Unable to meet with patient to complete initial consult prior to discharge.

## 2023-03-02 NOTE — Unmapped (Addendum)
Alicia Armstrong is a 47 y.o. 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 Celine Mans presenting admitted from the ED for worsening back pain and elevated total bilirubin. Her hospital course is described below.    Patient received labs and Liver ultrasound while in the ED which showed Tbili to 1.4 and AST/ALT 22/41. 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 Patient was started on Lovenox 30 BID. By discharge, her anti-Xa antibodies were normal and will continue on Lovenox 30 BID 6 weeks after discharge.     Given her ongoing ascites, she received lasix and spironolactone during her stay and responded well with good UOP. She will go home on PO lasix 20 for 5 days.     For patient's pain, she was started on oxycodone and robaxin. Patient reported that her pain was well controlled with robaxin, which was reassuring for musculoskeletal etiology as reason for flank pain. Patient will be discharged on robaxin for control of pain.    Otherwise, patient remained hemodynamically stable and afebrile during this hospital stay. She will follow up at the transplant clinic on Monday, 7/15.

## 2023-03-02 NOTE — Unmapped (Addendum)
Es al??rgico a lo siguiente  Al??rgeno Reacci??n   Penicilina  Tuvo un sarpullido por la penicilina cuando ten??a 8 a??os. Toler?? la piperacilina/tazobactam del 2 de enero de 2018 al 5 de enero de 2018 con Raenette Rover??n y sin sarpullido.

## 2023-03-02 NOTE — Unmapped (Shared)
Transplant Surgery Progress Note    Hospital Day: 4    Assessment:     Alicia Armstrong is a 47 y.o. 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 Celine Mans. She was readmitted on 02/28/23 d/t worsening back pain traveling to her abdomen along w/ elevated tbili.     Interval Events:     No acute events overnight. Pain is well controlled, mostly helped with Robaxin.  Vital signs are stable. Urine output is appropriate.    Plan:     Neuro:   - Pain well controlled   - Continue scheduled Robaxin q8h   - PRN oxycodone 5 and 10    CV:   - HDS, Maintain SBP < 180   - ECHO 7/8, summary below    Pulm:   - Stable on room air  - Continue incentive spirometry, pulmonary toilet, out of bed as tolerated    GI:   - F: ML   - E: Replete as needed   - N: regular diet   - pantoprazole, miralax, colace    *Low back pain iso recent OLT:   - Liver US 7/7: New parvus tardus waveform, low RI, and blunted systolic peak velocity within the right and left hepatic arteries    - CT Lumbar and Thoracic spine 7/7: No acute fractures   - CXR 7/7: Normal   - Lovenox 30 BID for 6 weeks, start 7/7   - Scheduled Robaxin for pain   - Diuresis w/ 20 Lasix and 50 Spironolactone daily, start 7/8    GU:   - Voiding per self    Endo:   -glucose checks ACHS    -SSI    Heme/ID:   - Afebrile, WBC and Hgb stable   - ppx with valcyte   - Lovenox 30 BID for 6 weeks w/ aspirin 81 daily, then aspirin 325 daily   - follow Anti-Xa today    Immuno:   - tac, cellcept, prednisone  - pharmacy recommendations for dosing      Dispo   - Floor   - Possible discharge 7/9 if labs normal after starting Lovenox       Objective:        Vital Signs:  BP 125/74  - Pulse 88  - Temp 37.4 ??C (99.3 ??F) (Oral)  - Resp 17  - Ht 154.9 cm (5' 0.98)  - Wt 77.2 kg (170 lb 1.6 oz)  - LMP 11/02/2022 (Approximate)  - SpO2 98%  - BMI 32.16 kg/m??     Input/Output:  I/O last 3 completed shifts:  In: 4372.1 [P.O.:1080; I.V.:3292.1]  Out: 4025 [Urine:4025]    Physical Exam:    General: Cooperative, no distress, well appearing female  Pulmonary: Normal work of breathing, on room air  Cardiovascular: RRR  Abdomen: Soft, non-tender, non-distended. Surgical incisions without signs of infection. Well healing.   Musculoskeletal: Moves extremities spontaneously   Neurologic: Alert and interactive, grossly intact    Labs:  Lab Results   Component Value Date    WBC 2.5 (L) 03/02/2023    HGB 8.4 (L) 03/02/2023    HCT 24.8 (L) 03/02/2023    PLT 126 (L) 03/02/2023       Lab Results   Component Value Date    NA 139 03/01/2023    K 4.4 03/01/2023    CL 106 03/01/2023    CO2 28.0 03/01/2023    BUN 20 03/01/2023    CREATININE  0.90 03/01/2023    CALCIUM 7.7 (L) 03/01/2023    MG 1.6 03/01/2023    PHOS 4.3 03/01/2023       Microbiology Results (last day)       ** No results found for the last 24 hours. **            Imaging:  Echo (7/8):  Summary    1. The left ventricle is normal in size with normal wall thickness.    2. The left ventricular systolic function is normal, LVEF is visually  estimated at 60-65%.    3. The left atrium is moderately dilated in size.    4. The right ventricle is normal in size, with normal systolic function.    5. There is mild to moderate tricuspid regurgitation.    6. There is probably mild pulmonary hypertension. TR maximum velocity: 3.0  m/s.  Estimated PASP is + RA pressure.    Ronni Rumble, MS3    ***

## 2023-03-02 NOTE — Unmapped (Signed)
Back pain controlled with scheduled robaxin. No oxycodone given . Independent with adls. Blood glucose levels treated as needed. No BM since admit. Miralax given. Labs monitored daily.   Problem: Adult Inpatient Plan of Care  Goal: Plan of Care Review  Outcome: Progressing  Goal: Patient-Specific Goal (Individualized)  Outcome: Progressing  Goal: Absence of Hospital-Acquired Illness or Injury  Outcome: Progressing  Intervention: Identify and Manage Fall Risk  Recent Flowsheet Documentation  Taken 03/01/2023 2200 by Sherril Cong, RN  Safety Interventions:   low bed   isolation precautions   neutropenic precautions   nonskid shoes/slippers when out of bed  Taken 03/01/2023 2000 by Sherril Cong, RN  Safety Interventions:   low bed   fall reduction program maintained   isolation precautions   neutropenic precautions   nonskid shoes/slippers when out of bed  Intervention: Prevent Skin Injury  Recent Flowsheet Documentation  Taken 03/01/2023 2200 by Sherril Cong, RN  Positioning for Skin: Supine/Back  Taken 03/01/2023 2000 by Sherril Cong, RN  Positioning for Skin: Supine/Back  Intervention: Prevent and Manage VTE (Venous Thromboembolism) Risk  Recent Flowsheet Documentation  Taken 03/01/2023 2045 by Sherril Cong, RN  VTE Prevention/Management:   ambulation promoted   anticoagulant therapy  Goal: Optimal Comfort and Wellbeing  Outcome: Progressing  Goal: Readiness for Transition of Care  Outcome: Progressing  Goal: Rounds/Family Conference  Outcome: Progressing     Problem: Wound  Goal: Optimal Coping  Outcome: Progressing  Goal: Optimal Functional Ability  Outcome: Progressing  Intervention: Optimize Functional Ability  Recent Flowsheet Documentation  Taken 03/01/2023 2200 by Sherril Cong, RN  Activity Management: up ad lib  Taken 03/01/2023 2000 by Sherril Cong, RN  Activity Management:   up ad lib   ambulated to bathroom  Goal: Absence of Infection Signs and Symptoms  Outcome: Progressing  Intervention: Prevent or Manage Infection  Recent Flowsheet Documentation  Taken 03/01/2023 2200 by Sherril Cong, RN  Isolation Precautions: protective precautions maintained  Taken 03/01/2023 2000 by Sherril Cong, RN  Isolation Precautions: protective precautions maintained  Goal: Improved Oral Intake  Outcome: Progressing  Goal: Optimal Pain Control and Function  Outcome: Progressing  Goal: Skin Health and Integrity  Outcome: Progressing  Intervention: Optimize Skin Protection  Recent Flowsheet Documentation  Taken 03/01/2023 2200 by Sherril Cong, RN  Activity Management: up ad lib  Pressure Reduction Techniques: frequent weight shift encouraged  Pressure Reduction Devices: pressure-redistributing mattress utilized  Taken 03/01/2023 2000 by Sherril Cong, RN  Activity Management:   up ad lib   ambulated to bathroom  Pressure Reduction Techniques: frequent weight shift encouraged  Pressure Reduction Devices: pressure-redistributing mattress utilized  Goal: Optimal Wound Healing  Outcome: Progressing

## 2023-03-02 NOTE — Unmapped (Signed)
Inicie sesi??n en My [place] Chart al menos 15 minutos antes de su cita para completar el proceso de registro electr??nico. Debe completar el registro electr??nico antes de poder comenzar su visita por video. Tambi??n recomendamos probar su conexi??n de audio y video para solucionar cualquier problema antes de que comience su visita. Haga clic en Unirse a la visita por video para completar estas comprobaciones. Una vez que haya completado el registro electr??nico y haya probado su audio y video, haga clic en Unirse a la llamada para conectarse a su visita.    Para su visita por video, necesitar?? una computadora con una c??mara, un altavoz y un micr??fono que funcionen, un tel??fono inteligente o una tableta con acceso a Internet.    My [place] Chart le permite administrar su salud, enviar mensajes no urgentes a su proveedor, [employer] de sus pruebas, Charity fundraiser y Building services engineer citas y Radio broadcast assistant reposiciones de recetas de Wellsite geologist segura y conveniente desde su computadora o dispositivo m??vil.    Puede ir a [email or web address] para iniciar sesi??n en su cuenta de My [place] Chart con su nombre de usuario y contrase??a. Si olvid?? su nombre de usuario o contrase??a, seleccione ??Olvid?? su nombre de usuario? o ??Olvid?? su contrase??a?. Enlaces para obtener acceso. Tambi??n puede acceder a su cuenta My [place] Chart con la aplicaci??n m??vil gratuita MyChart para Tour manager.    Si necesita ayuda para acceder a su cuenta My  Chart o para comunicarse con el consultorio de su proveedor para reprogramar o cancelar su cita, llame al Centro de acceso para pacientes ambulatorios de [facility] al [phone]    Su proveedor le enviar?? un enlace directo en el momento de su cita por video. NO acuda a la cl??nica.    Para su consulta por video, necesitar?? una computadora con c??mara, altavoz y micr??fono que funcionen, un tel??fono inteligente o una tableta con acceso a Internet.Marland Kitchen

## 2023-03-02 NOTE — Unmapped (Signed)
Tacrolimus Therapeutic Monitoring Pharmacy Note    Dorene Ingham is a 47 y.o. female continuing tacrolimus.     Indication: Liver transplant     Date of Transplant:  02/15/23       Prior Dosing Information: Current regimen tac 7 mg BID      Source(s) of information used to determine prior to admission dosing: MAR    Goals:  Therapeutic Drug Levels  Tacrolimus trough goal:  8-10 ng/mL    Additional Clinical Monitoring/Outcomes  Monitor renal function (SCr and urine output) and liver function (LFTs)  Monitor for signs/symptoms of adverse events (e.g., hyperglycemia, hyperkalemia, hypomagnesemia, hypertension, headache, tremor)    Results:   Tacrolimus level:  15.4 ng/mL, drawn appropriately    Pharmacokinetic Considerations and Significant Drug Interactions:  Concurrent hepatotoxic medications: None identified  Concurrent CYP3A4 substrates/inhibitors: None identified  Concurrent nephrotoxic medications: None identified    Assessment/Plan:  Recommendedation(s)  Hold 7/9 PM tacrolimus dose   Decrease tacrolimus to 6 mg BID starting 7/10 AM    Follow-up  Daily tac levels.   A pharmacist will continue to monitor and recommend levels as appropriate    Please page service pharmacist with questions/clarifications.    Vertis Kelch, PharmD

## 2023-03-03 MED ORDER — POLYETHYLENE GLYCOL 3350 17 GRAM ORAL POWDER PACKET
PACK | Freq: Every day | ORAL | 0 refills | 10 days | Status: CP
Start: 2023-03-03 — End: 2023-03-02

## 2023-03-03 MED ORDER — FUROSEMIDE 20 MG TABLET
ORAL_TABLET | Freq: Every day | ORAL | 0 refills | 5 days | Status: CP
Start: 2023-03-03 — End: 2023-03-08

## 2023-03-03 NOTE — Unmapped (Signed)
Patient's explant liver bx from 6/24 reviewed today in Pathology conference by Dr. Rebecca Eaton in the presence of Drs. S.Kapoor and [employer]. Per pathologist, the biopsy was consistent with cirrhosis and negative for any malignancy. Remnants of thrombus noted in the portal vein.

## 2023-03-03 NOTE — Unmapped (Signed)
Discharge Summary    Admit date: 02/27/2023    Discharge date and time: 03/02/23    Discharge to:  Home    Discharge Service: Surg Transplant [facility])    Discharge Attending Physician: No att. providers found    Discharge  Diagnoses: Back pain, resolved    Secondary Diagnosis: Active Problems:    * No active hospital problems. *  Resolved Problems:    * No resolved hospital problems. *      OR Procedures:       Ancillary Procedures: no procedures    Discharge Day Services:     Subjective   No acute events overnight. Pain Controlled. No fever or chills. She has appropriate UOP, is ambulating and doing well.    Objective   Patient Vitals for the past 8 hrs:   BP Temp Temp src Pulse Resp SpO2   03/02/23 1200 129/78 36.8 ??C (98.2 ??F) Oral 85 16 99 %     I/O this shift:  In: 234 [P.O.:234]  Out: 750 [Urine:750]    General Appearance:   No acute distress  Lungs:                Clear to auscultation bilaterally  Heart:                           Regular rate and rhythm  Abdomen:                Soft, non-tender, non-distended  Extremities:              Warm and well perfused    Hospital Course:  Alicia Armstrong is a 47 y.o. 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 Celine Mans presenting admitted from the ED for worsening back pain and elevated total bilirubin. Her hospital course is described below.    Patient received labs and Liver ultrasound while in the ED which showed Tbili to 1.4 and AST/ALT 22/41. 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 Patient was started on Lovenox 30 BID. By discharge, her anti-Xa antibodies were normal and will continue on Lovenox 30 BID 6 weeks after discharge.     Given her ongoing ascites, she received lasix and spironolactone during her stay and responded well with good UOP. She will go home on PO lasix 20 for 5 days.     For patient's pain, she was started on oxycodone and robaxin. Patient reported that her pain was well controlled with robaxin, which was reassuring for musculoskeletal etiology as reason for flank pain. Patient will be discharged on robaxin for control of pain.    Otherwise, patient remained hemodynamically stable and afebrile during this hospital stay. She will follow up at the transplant clinic on Monday, 7/15.      Condition at Discharge: Improved  Discharge Medications:      Medication List      START taking these medications     enoxaparin 30 mg/0.3 mL Syrg; Commonly known as: LOVENOX; Inject 0.3 mL   (30 mg total) under the skin two (2) times a day.; Notes to patient:   Inyecte 0.3 mL (30 mg total) bajo la piel dos (2) veces al d??a.   furosemide 20 MG  tablet; Commonly known as: LASIX; Tome 1 p??ldora por   v??a oral una vez al d??a. (Dosis = 20 mg) Use durante 5 d??as.; (Take 1   tablet (20 mg total) by mouth daily for 5 days.); Start taking on: March 03, 2023     CHANGE how you take these medications     tacrolimus 1 MG capsule; Commonly known as: PROGRAF; Take 6 capsules (6   mg total) by mouth two (2) times a day. Start on 7/10; What changed: how   much to take, additional instructions; Notes to patient: Tome 6 c??psulas   (6 mg total) por la boca dos (2) veces al d??a. Comience el 10 de [place].     CONTINUE taking these medications     * ACCU-CHEK GUIDE GLUCOSE METER Misc; Generic drug: blood-glucose meter;   Use seg??n las instrucciones; (Use as instructed)   * blood-glucose meter Misc; Use seg??n lo indicado; (Use as directed)   ACCU-CHEK GUIDE TEST STRIPS Strp; Generic drug: blood sugar diagnostic;   Use to check blood sugar as directed with insulin 3 times a day & for   symptoms of high or low blood sugar.; Notes to patient: Use para medir el   az??car en la sangre tal como se le indic?? con insulina 3 veces al d??a y   para s??ntomas de subidas y [place] de az??car en la sangre.   ACCU-CHEK SOFTCLIX LANCETS lancets; Generic drug: lancets; Use to check   blood sugar as directed with insulin 3 times a day & for symptoms of high   or low blood sugar.; Notes to patient: Use para medir el az??car en la   sangre tal como se le indic?? con insulina 3 veces al d??a y para s??ntomas   de subidas y [place] de az??car en la sangre.   acetaminophen 325 MG tablet; Commonly known as: Tylenol; Take 1-2   tablets (325-650 mg total) by mouth every eight (8) hours as needed for   pain or fever.; Notes to patient: Tome 1-2 tabletas (325-650 mg total) por   la boca cad ocho (8) horas cuando lo necesite para dolor o fiebre.   aspirin 81 MG tablet; Commonly known as: ECOTRIN; Tome 1 p??ldora por v??a   oral una vez al d??a. (Dosis = 81 mg); (Take 1 tablet (81 mg total) by   mouth daily.)   CALCIUM ANTACID 200 mg calcium (500 mg) chewable tablet; Generic drug:   calcium carbonate; Chew 1 tablet (500 mg total) Three (3) times a day.;   Notes to patient: Mastique una tableta (500mg ) tres veces al d??a.   docusate sodium 100 MG capsule; Commonly known as: COLACE; Use seg??n sea   necesario para el estre??imiento. Tome 1 p??ldora por v??a oral dos veces al   d??a. (Dosis = 100 mg); (Take 1 capsule (100 mg total) by mouth two (2)   times a day as needed for constipation.)   gabapentin 400 MG capsule; Commonly known as: NEURONTIN; Tome 1 p??ldora   por v??a oral tres veces al d??a. (Dosis = 400 mg); (Take 1 capsule (400 mg   total) by mouth Three (3) times a day.)   HEALTHYLAX 17 gram packet; Generic drug: polyethylene glycol; Mix 1   packet in  4 to 8 ounces of liquid and drink daily as needed.; Notes to   patient: Mezcle un paquete en 4 a 8 onzas de l??quido y beba a diario   cuando lo necesite.   insulin  lispro 100 unit/mL injection pen; Commonly known as: HumaLOG;   Inject 0-10 Units under the skin Three (3) times a day before meals.   Inject 1 unit for every 50 mg/dL over 454 mg/dL before meals; Notes to   patient: Inyecte 0-10 Unidades bajo la piel tres (3) veces al d??a antes de   comidas. Inyecte 1 unidad por cada 50 mg/dL sobre 098 mg/dL antes de las   comidas   JANUVIA 100 mg tablet; Generic drug: SITagliptin phosphate; Tome 1   p??ldora por v??a oral una vez al d??a. (Dosis = 100 mg); (Take 1 tablet (100   mg total) by mouth daily.)   magnesium (amino acid chelate) 133 mg tablet; Generic drug: magnesium   oxide-Mg AA chelate; Tome 1 p??ldora por v??a oral dos veces al d??a.; (Take   1 tablet by mouth two (2) times a day.)   methocarbamol 500 MG tablet; Commonly known as: ROBAXIN; Use seg??n sea   necesario. Tome 1 p??ldora por v??a oral tres veces al d??a. (Dosis = 500   mg); (Take 1 tablet (500 mg total) by mouth Three (3) times a day as   needed.)   mycophenolate 250 mg capsule; Commonly known as: CELLCEPT; Tome 1   p??ldora por v??a oral dos veces al d??a. (Dosis = 250 mg); (Take 1 capsule   (250 mg total) by mouth two (2) times a day.)   pantoprazole 40 MG tablet; Commonly known as: Protonix; Tome 1 p??ldora   por v??a oral una vez al d??a. (Dosis = 40 mg); (Take 1 tablet (40 mg total)   by mouth daily.)   pentamidine in sterile water; Inhale 6 mL (300 mg total) every   twenty-eight (28) days.; Start taking on: March 20, 2023; Notes to patient:   Inhale 6 mL (300 mg total) cada veintiocho (28) d??as.   predniSONE 5 MG tablet; Commonly known as: DELTASONE; Tome 1 p??ldora por   v??a oral una vez al d??a. (Dosis = 5 mg); (Take 1 tablet (5 mg total) by   mouth daily.)   UNIFINE PENTIPS 32 gauge x 5/32 (4 mm) Ndle; Generic drug: pen needle,   diabetic; Use with insulin up to 4 times/day as needed.; Notes to patient:   Use con la insulina hasta 4 veces al d??a cuando lo necesite.   valGANciclovir 450 mg tablet; Commonly known as: VALCYTE; Tome 1 p??ldora   por v??a oral una vez al d??a. (Dosis = 450 mg); (Take 1 tablet (450 mg   total) by mouth daily.)  * This list has 2 medication(s) that are the same as other medications   prescribed for you. Read the directions carefully, and ask your doctor or   other care provider to review them with you.     STOP taking these medications     oxyCODONE 5 MG immediate release tablet; Commonly known as: ROXICODONE       Pending Test Results:     Discharge Instructions:  Other Instructions    Es al??rgico a lo siguiente  Al??rgeno Reacci??n   Penicilina  Tuvo un sarpullido por la penicilina cuando ten??a 8 a??os. Toler?? la piperacilina/tazobactam del 2 de enero de 2018 al 5 de enero de 2018 con Raenette Rover??n y sin sarpullido.                       Future Appointments:  Appointments which have been scheduled for you      Mar 08, 2023 9:30 AM  (  Arrive by 9:00 AM)  LAB ONLY with SURTRA LAB ONLY  [facility] TRANSPLANT SURGERY [place] [facility] REGION) [address]  [place] [address]  [phone]        Mar 08, 2023 11:30 AM  (Arrive by 11:00 AM)  RETURN PHARMD with TRANSPLANT SURGERY PHARMACY  [facility] TRANSPLANT SURGERY [facility] REGION) [address]  [place] [address]  [phone]        Mar 08, 2023 1:00 PM  (Arrive by 12:30 PM)  RETURN 15 with Chirag Lanney Gins, MD  [facility] TRANSPLANT SURGERY [place] [facility] REGION) [address]  [place] [address]  [phone]        Apr 02, 2023 10:00 AM  (Arrive by 9:30 AM)  NEW VIDEO HCP MYCHART with Suzan Nailer, PsyD  [facility] TRANSPLANT SURGERY [place] [facility] REGION) [address]  [place] [address]  [phone]   Please sign into My [place] Chart at least 15 minutes before your appointment to complete the eCheck-In process. You must complete eCheck-In before you can start your video visit. We also recommend testing your audio and video connection to troubleshoot any issues before your visit begins. Click ???Join Video Visit??? to complete these checks. Once you have completed eCheck-In and tested your audio and video, click ???Join Call??? to connect to your visit.     For your video visit, you will need a computer with a working camera, speaker and microphone, a smartphone, or a tablet with internet access.    My [place] Chart enables you to manage your health, send non-urgent messages to your provider, view your test results, schedule and manage appointments, and request prescription refills securely and conveniently from your computer or mobile device.    You can go to [email or web address] to sign in to your My [place] Chart account with your username and password. If you have forgotten your username or password, please choose the ???Forgot Username???? and/or ???Forgot Password???? links to gain access. You also can access your My [place] Chart account with the free MyChart mobile app for Android or iPhone.    If you need assistance accessing your My [place] Chart account or for assistance in reaching your provider's office to reschedule or cancel your appointment, please call [facility] [phone].         May 17, 2023 11:30 AM  (Arrive by 11:00 AM)  RETURN PHARMD with TRANSPLANT SURGERY PHARMACY  [facility] TRANSPLANT SURGERY [facility] REGION) [address]  [place] [address]  [phone]        May 17, 2023 12:45 PM  (Arrive by 12:15 PM)  RETURN 15 with Chirag Lanney Gins, MD  [facility] TRANSPLANT SURGERY [facility] REGION) [address]  [place] [address]  [phone]        May 20, 2023 12:00 PM  (Arrive by 11:30 AM)  RETURN VIDEO HCP DIRECT LINK with Eliezer Bottom, LCSW  [facility] LIVER TRANSPLANT [place])  Arrive at: This is a Video Visit [address]  [place] [address]  [phone]   A direct link will be sent to you by your provider at the time of your video appointment. Please do NOT go to the clinic.     For your video visit, you will need a computer with a working camera, speaker and microphone, a  smartphone, or a tablet with internet access.       Additional instructions:    Inicie sesi??n en My [place] Chart al menos 15 minutos antes de su cita para completar el proceso de registro electr??nico. Debe completar el registro electr??nico antes de poder comenzar su visita por video. Tambi??n recomendamos probar su conexi??n de audio y video para solucionar cualquier problema antes de que comience su visita. Haga clic en Unirse a la visita por video para completar estas comprobaciones. Una vez que haya completado el registro electr??nico y haya probado su audio y video, haga clic en Unirse a la llamada para conectarse a su visita.    Para su visita por video, necesitar?? una computadora con una c??mara, un altavoz y un micr??fono que funcionen, un tel??fono inteligente o una tableta con acceso a Internet.    My [place] Chart le permite administrar su salud, enviar mensajes no urgentes a su proveedor, [employer] de sus pruebas, Charity fundraiser y Building services engineer citas y Radio broadcast assistant reposiciones de recetas de Wellsite geologist segura y conveniente desde su computadora o dispositivo m??vil.    Puede ir a [email or web address] para iniciar sesi??n en su cuenta de My [place] Chart con su nombre de usuario y contrase??a. Si olvid?? su nombre de usuario o contrase??a, seleccione ??Olvid?? su nombre de usuario? o ??Olvid?? su contrase??a?. Enlaces para obtener acceso. Tambi??n puede acceder a su cuenta My [place] Chart con la aplicaci??n m??vil gratuita MyChart para Tour manager.    Si necesita ayuda para acceder a su cuenta My [place] Chart o para comunicarse con el consultorio de su proveedor para reprogramar o cancelar su cita, llame al Centro de acceso para pacientes ambulatorios de [facility] al [phone]    Su proveedor le enviar?? un enlace directo en el momento de su cita por video. NO acuda a la cl??nica.    Para su consulta por video, necesitar?? una computadora con c??mara, altavoz y micr??fono que funcionen, un tel??fono inteligente o una tableta con acceso a Internet.Marland Kitchen

## 2023-03-04 DIAGNOSIS — D708 Other neutropenia: Principal | ICD-10-CM

## 2023-03-04 DIAGNOSIS — D642 Secondary sideroblastic anemia due to drugs and toxins: Principal | ICD-10-CM

## 2023-03-04 DIAGNOSIS — Z944 Liver transplant status: Principal | ICD-10-CM

## 2023-03-04 LAB — CBC W/ DIFFERENTIAL
BANDED NEUTROPHILS ABSOLUTE COUNT: 0 10*3/uL (ref 0.0–0.1)
BASOPHILS ABSOLUTE COUNT: 0 10*3/uL (ref 0.0–0.2)
BASOPHILS RELATIVE PERCENT: 2 %
EOSINOPHILS ABSOLUTE COUNT: 0.1 10*3/uL (ref 0.0–0.4)
EOSINOPHILS RELATIVE PERCENT: 4 %
HEMATOCRIT: 23.8 % — ABNORMAL LOW (ref 34.0–46.6)
HEMOGLOBIN: 7.9 g/dL — ABNORMAL LOW (ref 11.1–15.9)
IMMATURE GRANULOCYTES: 0 %
LYMPHOCYTES ABSOLUTE COUNT: 0.2 10*3/uL — ABNORMAL LOW (ref 0.7–3.1)
LYMPHOCYTES RELATIVE PERCENT: 10 %
MEAN CORPUSCULAR HEMOGLOBIN CONC: 33.2 g/dL (ref 31.5–35.7)
MEAN CORPUSCULAR HEMOGLOBIN: 31.3 pg (ref 26.6–33.0)
MEAN CORPUSCULAR VOLUME: 94 fL (ref 79–97)
MONOCYTES ABSOLUTE COUNT: 0.2 10*3/uL (ref 0.1–0.9)
MONOCYTES RELATIVE PERCENT: 8 %
NEUTROPHILS ABSOLUTE COUNT: 1.7 10*3/uL (ref 1.4–7.0)
NEUTROPHILS RELATIVE PERCENT: 76 %
PLATELET COUNT: 126 10*3/uL — ABNORMAL LOW (ref 150–450)
RED BLOOD CELL COUNT: 2.52 x10E6/uL — CL (ref 3.77–5.28)
RED CELL DISTRIBUTION WIDTH: 15.6 % — ABNORMAL HIGH (ref 11.7–15.4)
WHITE BLOOD CELL COUNT: 2.2 10*3/uL — CL (ref 3.4–10.8)

## 2023-03-04 LAB — COMPREHENSIVE METABOLIC PANEL
ALBUMIN: 3.7 g/dL — ABNORMAL LOW (ref 3.9–4.9)
ALKALINE PHOSPHATASE: 97 IU/L (ref 44–121)
ALT (SGPT): 27 IU/L (ref 0–32)
AST (SGOT): 18 IU/L (ref 0–40)
BILIRUBIN TOTAL (MG/DL) IN SER/PLAS: 1.1 mg/dL (ref 0.0–1.2)
BLOOD UREA NITROGEN: 11 mg/dL (ref 6–24)
BUN / CREAT RATIO: 13 (ref 9–23)
CALCIUM: 8.6 mg/dL — ABNORMAL LOW (ref 8.7–10.2)
CHLORIDE: 102 mmol/L (ref 96–106)
CO2: 26 mmol/L (ref 20–29)
CREATININE: 0.84 mg/dL (ref 0.57–1.00)
EGFR: 87 mL/min/{1.73_m2}
GLOBULIN, TOTAL: 1.6 g/dL (ref 1.5–4.5)
GLUCOSE: 124 mg/dL — ABNORMAL HIGH (ref 70–99)
POTASSIUM: 4.5 mmol/L (ref 3.5–5.2)
SODIUM: 138 mmol/L (ref 134–144)
TOTAL PROTEIN: 5.3 g/dL — ABNORMAL LOW (ref 6.0–8.5)

## 2023-03-04 LAB — GAMMA GT: GAMMA GLUTAMYL TRANSFERASE: 80 IU/L — ABNORMAL HIGH (ref 0–60)

## 2023-03-04 LAB — PHOSPHORUS: PHOSPHORUS, SERUM: 4.1 mg/dL (ref 3.0–4.3)

## 2023-03-04 LAB — BILIRUBIN, DIRECT: BILIRUBIN DIRECT: 0.38 mg/dL (ref 0.00–0.40)

## 2023-03-04 LAB — MAGNESIUM: MAGNESIUM: 2 mg/dL (ref 1.6–2.3)

## 2023-03-04 NOTE — Unmapped (Signed)
Received message from PharmD [employer], that patient needs LMWH Anti-xa at her next clinic appt and should plan to administer her lovenox as close to 9am that day for planned lab at ~1pm. [facility] Hessel[facility] [phone]) for assistance and called patient. Ms. Kinslow confirmed she has been administering her lovenox shots twice daily at ~9a/9p. She also communicated that she is taking her tacrolimus regularly, after her morning blood draws. Instructed her to bring her lovenox on Monday and plan to administer it as close to 9am as possible, recording the exact time to provide to txp team. Also encouraged her to bring all her medications, med list, and VS log. Patient denied any txp related questions, but requested number for SS administration re.providing proof of her txp and asked for all her txp records. Let her know that she would need to find the contact number for the SSA via the [place] govt website and that if the specific records she needed exceeded an operative report or clinic note, then she would need to contact the [facility] HIM dept for that. Provided her contact number for HIM. She communicated understanding of all discussed.

## 2023-03-05 NOTE — Unmapped (Signed)
error 

## 2023-03-06 LAB — CBC W/ DIFFERENTIAL
BANDED NEUTROPHILS ABSOLUTE COUNT: 0 10*3/uL (ref 0.0–0.1)
BASOPHILS ABSOLUTE COUNT: 0 10*3/uL (ref 0.0–0.2)
BASOPHILS RELATIVE PERCENT: 1 %
EOSINOPHILS ABSOLUTE COUNT: 0.1 10*3/uL (ref 0.0–0.4)
EOSINOPHILS RELATIVE PERCENT: 6 %
HEMATOCRIT: 22.4 % — ABNORMAL LOW (ref 34.0–46.6)
HEMOGLOBIN: 7.3 g/dL — ABNORMAL LOW (ref 11.1–15.9)
IMMATURE GRANULOCYTES: 0 %
LYMPHOCYTES ABSOLUTE COUNT: 0.3 10*3/uL — ABNORMAL LOW (ref 0.7–3.1)
LYMPHOCYTES RELATIVE PERCENT: 19 %
MEAN CORPUSCULAR HEMOGLOBIN CONC: 32.6 g/dL (ref 31.5–35.7)
MEAN CORPUSCULAR HEMOGLOBIN: 29.9 pg (ref 26.6–33.0)
MEAN CORPUSCULAR VOLUME: 92 fL (ref 79–97)
MONOCYTES ABSOLUTE COUNT: 0.2 10*3/uL (ref 0.1–0.9)
MONOCYTES RELATIVE PERCENT: 12 %
NEUTROPHILS ABSOLUTE COUNT: 0.9 10*3/uL — ABNORMAL LOW (ref 1.4–7.0)
NEUTROPHILS RELATIVE PERCENT: 62 %
PLATELET COUNT: 119 10*3/uL — ABNORMAL LOW (ref 150–450)
RED BLOOD CELL COUNT: 2.44 x10E6/uL — CL (ref 3.77–5.28)
RED CELL DISTRIBUTION WIDTH: 15.8 % — ABNORMAL HIGH (ref 11.7–15.4)
WHITE BLOOD CELL COUNT: 1.4 10*3/uL — CL (ref 3.4–10.8)

## 2023-03-06 LAB — COMPREHENSIVE METABOLIC PANEL
ALBUMIN: 3.6 g/dL — ABNORMAL LOW (ref 3.9–4.9)
ALKALINE PHOSPHATASE: 83 IU/L (ref 44–121)
ALT (SGPT): 24 IU/L (ref 0–32)
AST (SGOT): 19 IU/L (ref 0–40)
BILIRUBIN TOTAL (MG/DL) IN SER/PLAS: 0.9 mg/dL (ref 0.0–1.2)
BLOOD UREA NITROGEN: 11 mg/dL (ref 6–24)
BUN / CREAT RATIO: 13 (ref 9–23)
CALCIUM: 8.6 mg/dL — ABNORMAL LOW (ref 8.7–10.2)
CHLORIDE: 102 mmol/L (ref 96–106)
CO2: 26 mmol/L (ref 20–29)
CREATININE: 0.84 mg/dL (ref 0.57–1.00)
EGFR: 87 mL/min/{1.73_m2}
GLOBULIN, TOTAL: 1.8 g/dL (ref 1.5–4.5)
GLUCOSE: 106 mg/dL — ABNORMAL HIGH (ref 70–99)
POTASSIUM: 4.7 mmol/L (ref 3.5–5.2)
SODIUM: 139 mmol/L (ref 134–144)
TOTAL PROTEIN: 5.4 g/dL — ABNORMAL LOW (ref 6.0–8.5)

## 2023-03-06 LAB — PHOSPHORUS: PHOSPHORUS, SERUM: 3.7 mg/dL (ref 3.0–4.3)

## 2023-03-06 LAB — TACROLIMUS LEVEL: TACROLIMUS BLOOD: 7.6 ng/mL (ref 2.0–20.0)

## 2023-03-06 LAB — BILIRUBIN, DIRECT: BILIRUBIN DIRECT: 0.34 mg/dL (ref 0.00–0.40)

## 2023-03-06 LAB — GAMMA GT: GAMMA GLUTAMYL TRANSFERASE: 67 IU/L — ABNORMAL HIGH (ref 0–60)

## 2023-03-06 LAB — MAGNESIUM: MAGNESIUM: 1.7 mg/dL (ref 1.6–2.3)

## 2023-03-08 ENCOUNTER — Ambulatory Visit: Admit: 2023-03-08 | Discharge: 2023-03-10 | Payer: PRIVATE HEALTH INSURANCE

## 2023-03-08 ENCOUNTER — Ambulatory Visit: Payer: PRIVATE HEALTH INSURANCE

## 2023-03-08 ENCOUNTER — Encounter: Admit: 2023-03-08 | Discharge: 2023-03-10 | Payer: PRIVATE HEALTH INSURANCE

## 2023-03-08 ENCOUNTER — Institutional Professional Consult (permissible substitution): Payer: PRIVATE HEALTH INSURANCE

## 2023-03-08 DIAGNOSIS — Z944 Liver transplant status: Principal | ICD-10-CM

## 2023-03-08 LAB — COMPREHENSIVE METABOLIC PANEL
ALBUMIN: 3 g/dL — ABNORMAL LOW (ref 3.4–5.0)
ALKALINE PHOSPHATASE: 77 U/L (ref 46–116)
ALT (SGPT): 19 U/L (ref 10–49)
ANION GAP: 6 mmol/L (ref 5–14)
AST (SGOT): 19 U/L (ref <=34)
BILIRUBIN TOTAL: 0.9 mg/dL (ref 0.3–1.2)
BLOOD UREA NITROGEN: 9 mg/dL (ref 9–23)
BUN / CREAT RATIO: 12
CALCIUM: 8.1 mg/dL — ABNORMAL LOW (ref 8.7–10.4)
CHLORIDE: 107 mmol/L (ref 98–107)
CO2: 27 mmol/L (ref 20.0–31.0)
CREATININE: 0.78 mg/dL
EGFR CKD-EPI (2021) FEMALE: >90 mL/min/{1.73_m2} (ref >=60)
GLUCOSE RANDOM: 90 mg/dL (ref 70–99)
POTASSIUM: 4.6 mmol/L (ref 3.4–4.8)
PROTEIN TOTAL: 5.5 g/dL — ABNORMAL LOW (ref 5.7–8.2)
SODIUM: 140 mmol/L (ref 135–145)

## 2023-03-08 LAB — RETICULOCYTES
RETICULOCYTE ABSOLUTE COUNT: 75.8 10*9/L (ref 23.0–100.0)
RETICULOCYTE COUNT PCT: 3.37 % — ABNORMAL HIGH (ref 0.50–2.17)

## 2023-03-08 LAB — LMWH ANTI-XA: HEPARIN LOW MOLECULAR WEIGHT: 0.09 [IU]/mL

## 2023-03-08 LAB — CBC W/ AUTO DIFF
BASOPHILS ABSOLUTE COUNT: 0 10*9/L (ref 0.0–0.1)
BASOPHILS RELATIVE PERCENT: 2.1 %
EOSINOPHILS ABSOLUTE COUNT: 0.1 10*9/L (ref 0.0–0.5)
EOSINOPHILS RELATIVE PERCENT: 5.4 %
HEMATOCRIT: 20.3 % — ABNORMAL LOW (ref 34.0–44.0)
HEMOGLOBIN: 6.9 g/dL — ABNORMAL LOW (ref 11.3–14.9)
LYMPHOCYTES ABSOLUTE COUNT: 0.2 10*9/L — ABNORMAL LOW (ref 1.1–3.6)
LYMPHOCYTES RELATIVE PERCENT: 24.3 %
MEAN CORPUSCULAR HEMOGLOBIN CONC: 33.9 g/dL (ref 32.0–36.0)
MEAN CORPUSCULAR HEMOGLOBIN: 30.1 pg (ref 25.9–32.4)
MEAN CORPUSCULAR VOLUME: 88.8 fL (ref 77.6–95.7)
MEAN PLATELET VOLUME: 7.7 fL (ref 6.8–10.7)
MONOCYTES ABSOLUTE COUNT: 0.2 10*9/L — ABNORMAL LOW (ref 0.3–0.8)
MONOCYTES RELATIVE PERCENT: 15.7 %
NEUTROPHILS ABSOLUTE COUNT: 0.5 10*9/L — ABNORMAL LOW (ref 1.8–7.8)
NEUTROPHILS RELATIVE PERCENT: 52.5 %
PLATELET COUNT: 120 10*9/L — ABNORMAL LOW (ref 150–450)
RED BLOOD CELL COUNT: 2.29 10*12/L — ABNORMAL LOW (ref 3.95–5.13)
RED CELL DISTRIBUTION WIDTH: 17.5 % — ABNORMAL HIGH (ref 12.2–15.2)
WBC ADJUSTED: 1 10*9/L — ABNORMAL LOW (ref 3.6–11.2)

## 2023-03-08 LAB — TACROLIMUS LEVEL
TACROLIMUS BLOOD: 10.7 ng/mL (ref 2.0–20.0)
TACROLIMUS BLOOD: 5.5 ng/mL

## 2023-03-08 LAB — BILIRUBIN, DIRECT: BILIRUBIN DIRECT: 0.5 mg/dL — ABNORMAL HIGH (ref 0.00–0.30)

## 2023-03-08 LAB — PHOSPHORUS: PHOSPHORUS: 3.1 mg/dL (ref 2.4–5.1)

## 2023-03-08 LAB — FERRITIN: FERRITIN: 396.4 ng/mL — ABNORMAL HIGH

## 2023-03-08 LAB — GAMMA GT: GAMMA GLUTAMYL TRANSFERASE: 65 U/L — ABNORMAL HIGH

## 2023-03-08 LAB — MAGNESIUM: MAGNESIUM: 1.7 mg/dL (ref 1.6–2.6)

## 2023-03-08 MED ORDER — LETERMOVIR 480 MG TABLET
ORAL_TABLET | Freq: Every day | ORAL | 2 refills | 28 days | Status: CP
Start: 2023-03-08 — End: ?

## 2023-03-08 MED ORDER — SITAGLIPTIN PHOSPHATE 100 MG TABLET
ORAL_TABLET | Freq: Every day | ORAL | 3 refills | 90 days | Status: CP
Start: 2023-03-08 — End: 2024-03-07

## 2023-03-08 MED ORDER — PREDNISONE 5 MG TABLET
ORAL_TABLET | Freq: Every day | ORAL | 3 refills | 90 days | Status: CP
Start: 2023-03-08 — End: ?
  Filled 2023-03-18: qty 30, 30d supply, fill #0

## 2023-03-08 MED ORDER — ENOXAPARIN 30 MG/0.3 ML SUBCUTANEOUS SYRINGE
Freq: Two times a day (BID) | SUBCUTANEOUS | 1 refills | 30 days | Status: CP
Start: 2023-03-08 — End: ?

## 2023-03-08 MED ORDER — TACROLIMUS 1 MG CAPSULE, IMMEDIATE-RELEASE
ORAL_CAPSULE | Freq: Two times a day (BID) | ORAL | 11 refills | 50 days | Status: CP
Start: 2023-03-08 — End: ?

## 2023-03-08 MED ORDER — ASPIRIN 81 MG TABLET,DELAYED RELEASE
ORAL_TABLET | Freq: Every day | ORAL | 3 refills | 90 days | Status: CP
Start: 2023-03-08 — End: ?

## 2023-03-08 MED ORDER — PANTOPRAZOLE 40 MG TABLET,DELAYED RELEASE
ORAL_TABLET | Freq: Every day | ORAL | 11 refills | 30 days | Status: CP
Start: 2023-03-08 — End: ?
  Filled 2023-03-18: qty 30, 30d supply, fill #0

## 2023-03-08 MED ORDER — CALCIUM 200 MG (AS CALCIUM CARBONATE 500 MG) CHEWABLE TABLET
ORAL_TABLET | Freq: Three times a day (TID) | ORAL | 3 refills | 50 days | Status: CP
Start: 2023-03-08 — End: ?

## 2023-03-08 MED ORDER — DOCUSATE SODIUM 100 MG CAPSULE
ORAL_CAPSULE | Freq: Two times a day (BID) | ORAL | 11 refills | 30 days | Status: CP | PRN
Start: 2023-03-08 — End: ?

## 2023-03-08 MED ORDER — MG-PLUS-PROTEIN 133 MG TABLET
ORAL_TABLET | Freq: Two times a day (BID) | ORAL | 6 refills | 50 days | Status: CP
Start: 2023-03-08 — End: ?

## 2023-03-08 MED ORDER — MYCOPHENOLATE MOFETIL 250 MG CAPSULE
ORAL_CAPSULE | 11 refills | 0 days | Status: SS
Start: 2023-03-08 — End: ?

## 2023-03-08 MED ORDER — METHOCARBAMOL 500 MG TABLET
ORAL_TABLET | Freq: Three times a day (TID) | ORAL | 11 refills | 30 days | Status: CP | PRN
Start: 2023-03-08 — End: ?

## 2023-03-08 MED ORDER — ACETAMINOPHEN 325 MG TABLET
ORAL_TABLET | Freq: Three times a day (TID) | ORAL | 11 refills | 10 days | Status: CP | PRN
Start: 2023-03-08 — End: ?
  Filled 2023-03-18: qty 90, 90d supply, fill #0

## 2023-03-08 MED ORDER — GABAPENTIN 400 MG CAPSULE
ORAL_CAPSULE | Freq: Three times a day (TID) | ORAL | 3 refills | 30 days | Status: CP
Start: 2023-03-08 — End: ?
  Filled 2023-03-18: qty 90, 30d supply, fill #0

## 2023-03-08 MED ADMIN — acetaminophen (TYLENOL) tablet 650 mg: 650 mg | ORAL | NDC 50580048790

## 2023-03-08 MED ADMIN — methocarbamol (ROBAXIN) tablet 500 mg: 500 mg | ORAL | @ 20:00:00 | NDC 71288071611

## 2023-03-08 MED ADMIN — gabapentin (NEURONTIN) capsule 400 mg: 400 mg | ORAL | @ 20:00:00 | NDC 76420023490

## 2023-03-08 NOTE — Unmapped (Signed)
[facility] CLINIC PHARMACY NOTE  Alicia Armstrong  [id number]    Medication changes today:   1. Stop Humalog  2. Hold MMF    Education/Adherence tools provided today:  - Provided updated medication list  - Provided additional education on immunosuppression and transplant related medications including reviewing indications of medications, dosing and side effects  - Discussed adherence reminder tools such as cell phone alarms    Follow up items:  1. Goal of understanding indications and dosing of immunosuppression medications  2. Pentamidine scheduling  3. Insurance coverage for letermovir    Next visit with pharmacy in 1-2 weeks  ____________________________________________________________________    Alicia Armstrong is a 47 y.o. 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     Immunologic Risk: first transplant    Other PMH significant for n/a    Post op course uncomplicated    Readmitted 7/6-7/9 with back pain and elevated bilirubin  Liver ultrasound showed concern for possible thrombosis or stenosis at common hepatic artery for which LMWH ppx 30 mg BID with plans for continuation for ~6 weeks.    Rejection History: NTD  Infection History: NTD  ___________________________________________________________________    First visit with transplant pharmacy    Interval History: discharge from 7/6 admission    Seen by pharmacy today for: medication management, blood glucose management and education, and pill box fill and adherence education    CC:  Patient complains of  mild pain that comes and goes at incision site    General: Lack of energy  Neuro:  occasional HA and feels tremulous inside  CV: No issues  Resp: No issues  GI: Constipation and what sounds like reflux  GU: No issues  Derm: No issues  Psych: No issues.    Fluid Status:   Edema yes LE edema + ascites SOB no  Intake: 80-100 oz    Plan: Consider inpt diuresis      There were no vitals filed for this visit.  ___________________________________________________________________    Allergies   Allergen Reactions    Penicillins Other (See Comments)     Had a rash with penicillin when she was 47 years old. She tolerated piperacillin/tazobactam 08/25/2016-08/28/2016 with itching without rash.       Medications reviewed in EPIC medication station and updated today by the clinical pharmacist practitioner.    Current Outpatient Medications   Medication Instructions    acetaminophen (TYLENOL) 325-650 mg, Oral, Every 8 hours PRN    aspirin (ECOTRIN) 81 mg, Oral, Daily (standard)    blood sugar diagnostic (GLUCOSE BLOOD) Strp Use to check blood sugar as directed with insulin 3 times a day & for symptoms of high or low blood sugar.    blood-glucose meter kit Use as instructed    blood-glucose meter Misc Use as directed    CALCIUM ANTACID 500 mg, Oral, 3 times a day (standard)    docusate sodium (COLACE) 100 mg, Oral, 2 times a day PRN    enoxaparin (LOVENOX) 30 mg, Subcutaneous, 2 times a day (standard)    furosemide (LASIX) 20 mg, Oral, Daily (standard)    gabapentin (NEURONTIN) 400 mg, Oral, 3 times a day (standard)    insulin lispro (HUMALOG) 0-10 Units, Subcutaneous, 3 times a day (AC), Inject 1 unit for every 50 mg/dL over 811 mg/dL before meals    JANUVIA 100 mg, Oral, Daily (standard)    lancets Misc Use to check blood sugar as directed with insulin 3  times a day & for symptoms of high or low blood sugar.    magnesium oxide-Mg AA chelate (MAGNESIUM, AMINO ACID CHELATE,) 133 mg 1 tablet, Oral, 2 times a day (standard)    methocarbamol (ROBAXIN) 500 mg, Oral, 3 times a day PRN    mycophenolate (CELLCEPT) 250 mg, Oral, 2 times a day (standard)    pantoprazole (PROTONIX) 40 mg, Oral, Daily (standard)    pen needle, diabetic 32 gauge x 5/32 (4 mm) Ndle Use with insulin up to 4 times/day as needed.    [START ON 03/20/2023] pentamidine in sterile water 300 mg, Inhalation, Every 28 days    polyethylene glycol (MIRALAX) 17 gram packet Mix 1 packet in  4 to 8 ounces of liquid and drink daily as needed.    predniSONE (DELTASONE) 5 mg, Oral, Daily (standard)    tacrolimus (PROGRAF) 6 mg, Oral, 2 times a day, Start on 7/10    valGANciclovir (VALCYTE) 450 mg, Oral, Daily (standard)         GRAFT FUNCTION: stable    Lab Results   Component Value Date    AST 19 03/05/2023    ALT 24 03/05/2023    Total Bilirubin 0.9 03/05/2023        Zero hour biopsy: negative for malignancy  Biopsies to date: NTD      Renal Function: stable    Lab Results   Component Value Date    Creatinine 0.84 03/05/2023    Creatinine 0.84 03/03/2023    Creatinine 0.91 03/02/2023    Creatinine 0.90 03/01/2023    Creatinine 0.94 02/28/2023    Creatinine 1.05 (H) 02/27/2023    Creatinine 1.09 (H) 02/26/2023    Creatinine 0.82 02/10/2023       Proteinuria/UPC:  none.  No results found for: PCRATIOUR        CURRENT IMMUNOSUPPRESSION:    Tacrolimus (Prograf) 6 mg BID  Tacrolimus Goal: 8 - 10   Mycophenolate mofetil (Cellcept) 250 mg BID    Prednisone 5 mg daily    IMMUNOSUPPRESSION DRUG LEVELS:  Lab Results   Component Value Date    Tacrolimus, Trough 10.7 02/23/2023    Tacrolimus, Trough 8.9 02/22/2023    Tacrolimus, Trough 8.4 02/21/2023    Tacrolimus Lvl 10.7 03/05/2023    Tacrolimus Lvl 7.6 03/03/2023    Tacrolimus Lvl 23.2 (HH) 02/26/2023    Tacrolimus, Timed 15.4 03/02/2023    Tacrolimus, Timed 11.4 03/01/2023     No results found for: CYCLO  No results found for: EVEROLIMUS  No results found for: SIROLIMUS    Prograf level is accurate 12 hour trough    Patient complains of tremor on the inside and occasional HA    WBC/ANC:   neutropenic - ANC 0.5  Lab Results   Component Value Date    WBC 1.4 (LL) 03/05/2023       Plan:  Hold MMF due to neutropenia  and will likely need G-CSF inpt. Continue to monitor.      OI Prophylaxis:   CMV Status: D-/ R+, moderate risk.   CMV prophylaxis: valganciclovir 450 mg daily x 3 months per protocol (end 05/18/23)  No results found for: CMVCP    PCP Prophylaxis: pentamidine 300 mg inhalation monthly x 6 months.    Thrush: completed in hospital    Patient is  tolerating infectious prophylaxis well    Plan:  Consider switching to letermovir .  Rx sent to [facility] pharmacy for benefits investigation Continue to monitor.  HAT ppx:  Meds currently on: aspirin 81 mg daily, LMWH 30 mg q12h (goal anti-Xa 0.2-0.4)  Plan: Get antiXa between 1300-1500 today.    CAD prevention:  Statin therapy: Indicated; currently on no statin  Plan: Continue to monitor       BP: Goal < 140/90. Clinic vitals reported above  Home BP ranges: 110/70s    Current meds include: none  Plan: within goal. Continue to monitor    Anemia of CKD:  H/H:   Lab Results   Component Value Date    HGB 7.3 (L) 03/05/2023     Lab Results   Component Value Date    HCT 22.4 (L) 03/05/2023     Iron panel:  Lab Results   Component Value Date    IRON 75 11/11/2016    TIBC 305.7 11/11/2016    FERRITIN 26.2 11/11/2016     Lab Results   Component Value Date    Iron Saturation (%) 25 11/11/2016       Prior ESA use: none post txp      Plan:  Admit for PRBCs . Aranesp therapy plan pending PA approval.  Continue to monitor.     DM:   Lab Results   Component Value Date    A1C 4.6 (L) 02/14/2023   . Goal A1c < 7  History of Dm?  No - post txp hyperglycemia  Established with endocrinologist/PCP for BG managment? No  Currently on: Humalog per SSI and sitagliptin 100 mg daily  Home BS log:   Breakfast Lunch  Dinner  HS   Date AC PC [facility]    7/10 121  183  157     7/11 118  123  137  110   7/12 101  111  132  107   7/13 101  161  144     7/14 111  150  115  96     Diet:3 meals per day + 2 protein shakes  Exercise:not yet  Hypoglycemia: no  Plan: Stop Humalog. Consider if sitagliptin is still needed at next visit      Electrolytes: wnl  Lab Results   Component Value Date    Potassium 4.7 03/05/2023    Potassium, Bld 3.8 09/07/2022    Sodium 139 03/05/2023    Sodium Whole Blood 131 (L) 09/07/2022    Magnesium 1.7 03/05/2023    CO2 26 03/05/2023       Meds currently on: TUMS 1 tab TID  Plan: No change.  Continue to monitor     GI/BM: pt reports constipation and has some reflux symptoms  Meds currently on: docusate PRN, Miralax PRN, pantoprazole 40 mg daily  Plan: No change.  Continue to monitor    Pain: pt reports mild pain at incision site that comes and goes  Meds currently on: APAP PRN, methocarbamol 500 mg TID PRN, gabapentin 400 mg TID   Plan: No change.  Continue to monitor    Bone health:   Vitamin D Level: none available. Goal > 30.   No results found for: VITDTOT, VITDTOTAL    Lab Results   Component Value Date    Calcium 8.6 (L) 03/05/2023    Calcium 8.6 (L) 03/03/2023       Last DEXA results:   normal in 2018  Current meds include: none  Plan: Vitamin D level  needs to be drawn with next lab schedule. Continue to monitor.     Women's/Men's Health:  Alicia  Tyronica Armstrong is a 47 y.o. Female perimenopausal. Patient reports no men's/women's health issues  Plan: Continue to monitor    Immunizations:  Influenza [Annual]: Received 2023    PPSV23: Received 08/2016  PCV20: Received 08/2022    Shingrix Zoster [2 doses, 2 - 6 months apart]: 1st dose given 08/2022 and 2nd dose due 10/2022    COVID-19 [3 primary doses, 2 boosters]: 1st dose given 09/22/22    Immunization status: up to date and documented.    Pharmacy preference:  [facility]  Medication Refills:  N/a  Medication Access:  Letermovir sent to [facility]    Adherence: Patient has poor understanding of medications; was not able to independently identify names/doses of immunosuppressants and OI meds.  Patient  does fill their own pill box on a regular basis at home.  Patient brought medication card:yes  Pill box:was correct  Plan: provided extensive adherence counseling/intervention    Patient was reviewed with  Dr. Imogene Burn  who was agreement with the stated plan:     During this visit, the following was completed:   BG log data assessment  BP log data assessment  Labs ordered and evaluated  complex treatment plan >1 DS   I spent a total of 40 minutes face to face with the patient delivering clinical care and providing education/counseling.    All questions/concerns were addressed to the patient's satisfaction.  __________________________________________    Jordan Likes, PharmD, CPP  Solid Organ Transplant Clinical Pharmacist Practitioner

## 2023-03-08 NOTE — Unmapped (Signed)
Per Dr. Imogene Burn patient to be directly admitted to [facility] for low hemoglobin and WBC - direct admission called in under OBS status - patient will taken [facility] by clinic staff.

## 2023-03-08 NOTE — Unmapped (Signed)
Addended by: Jordan Likes on: 03/08/2023 01:11 PM     Modules accepted: Orders

## 2023-03-08 NOTE — Unmapped (Signed)
Patient is alert and oriented, admitted for blood transfusion for Hgb 6.9. S/p liver transplant 02/14/23.   Problem: Adult Inpatient Plan of Care  Goal: Plan of Care Review  Outcome: Ongoing - Unchanged  Flowsheets (Taken 03/08/2023 1600)  Progress: no change  Plan of Care Reviewed With: patient  Goal: Patient-Specific Goal (Individualized)  Outcome: Ongoing - Unchanged  Goal: Absence of Hospital-Acquired Illness or Injury  Outcome: Ongoing - Unchanged  Intervention: Identify and Manage Fall Risk  Recent Flowsheet Documentation  Taken 03/08/2023 1400 by Merlene Pulling, Alphonse Guild, RN  Safety Interventions:   isolation precautions   lighting adjusted for tasks/safety   low bed   nonskid shoes/slippers when out of bed  Taken 03/08/2023 1300 by Merlene Pulling, Alphonse Guild, RN  Safety Interventions:   family at bedside   lighting adjusted for tasks/safety   low bed   nonskid shoes/slippers when out of bed  Intervention: Prevent Skin Injury  Recent Flowsheet Documentation  Taken 03/08/2023 1400 by Ardelle Lesches, RN  Positioning for Skin: Supine/Back  Taken 03/08/2023 1300 by Ardelle Lesches, RN  Positioning for Skin: Supine/Back  Intervention: Prevent and Manage VTE (Venous Thromboembolism) Risk  Recent Flowsheet Documentation  Taken 03/08/2023 1530 by Merlene Pulling, Alphonse Guild, RN  Anti-Embolism Device Type: SCD, Knee  Anti-Embolism Intervention: On  Anti-Embolism Device Location: BLE  Goal: Optimal Comfort and Wellbeing  Outcome: Ongoing - Unchanged  Goal: Readiness for Transition of Care  Outcome: Ongoing - Unchanged  Goal: Rounds/Family Conference  Outcome: Ongoing - Unchanged

## 2023-03-08 NOTE — Unmapped (Signed)
Tacrolimus Therapeutic Monitoring Pharmacy Note    Alicia Armstrong is a 47 y.o. female continuing tacrolimus.     Indication: Liver transplant     Date of Transplant:  02/15/2023       Prior Dosing Information: Home regimen 6 mg PO BID      Source(s) of information used to determine prior to admission dosing: Clinic Note    Goals:  Therapeutic Drug Levels  Tacrolimus trough goal: 8-10 ng/mL    Additional Clinical Monitoring/Outcomes  Monitor renal function (SCr and urine output) and liver function (LFTs)  Monitor for signs/symptoms of adverse events (e.g., hyperglycemia, hyperkalemia, hypomagnesemia, hypertension, headache, tremor)    Results:   Tacrolimus level:  5.5 ng/mL, drawn appropriately per clinic team    Pharmacokinetic Considerations and Significant Drug Interactions:  Concurrent hepatotoxic medications: None identified  Concurrent CYP3A4 substrates/inhibitors: None identified  Concurrent nephrotoxic medications: None identified    Assessment/Plan:  Recommendedation(s)  After discussion with SRF attending, will increase regimen to 8 mg PO BID.    Follow-up  Daily levels have been ordered at 0600 .   A pharmacist will continue to monitor and recommend levels as appropriate    Please page service pharmacist with questions/clarifications.    Christene Lye, PharmD

## 2023-03-08 NOTE — Unmapped (Signed)
Please see patient pharmacy visit for documentation.    In-person Interpreter has been requested.

## 2023-03-08 NOTE — Unmapped (Signed)
TRANSPLANT SURGERY OUTPATIENT PROGRESS NOTE    Date: 03/08/2023      Subjective  Alicia Armstrong is a 47 y.o. 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 Index hospital stay overall unremarkable. She was admitted 7/6-04/2023 for back pain, resolved. 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   She returns to clinic for regularly scheduled follow up. No complaints. Her appetite has not yet returned to normal but she is tolerating PO intake without nausea or vomiting. She denies fevers, chills, chest pain, or SOB.   Her current immunosuppression is with tacrolimus 6 mg PO BID, Cellcept 250 mg PO BID, pred 5 mg daily       Objective    Vitals:    03/08/23 1041   BP: 107/66   Pulse: 92   Temp: 37.2 ??C (98.9 ??F)   TempSrc: Tympanic   Weight: 72.6 kg (160 lb)   Height: 154.9 cm (5' 0.98)      Body mass index is 30.25 kg/m??.     Physical Exam:    General Appearance:   No acute distress  Lungs:                Clear to auscultation bilaterally  Heart:                           Regular rate and rhythm  Pulm:     Unlabored respirations, symmetric chest rise  Abdomen:                Soft, non-tender, non-distended. Incision c/d/I.   Extremities:              Warm and well perfused  Neuro:   No gross deficits       Data Review:  All lab results last 24 hours:     Latest Ref Rng 03/08/2023   Transplant Labs     Hct 34.0 - 44.0 % 20.3 (L)    WBC 3.6 - 11.2 10*9/L 1.0 (L)    RBC 3.95 - 5.13 10*12/L 2.29 (L)    Na 135 - 145 mmol/L 140    K 3.4 - 4.8 mmol/L 4.6    Cl 98 - 107 mmol/L 107    CO2 20.0 - 31.0 mmol/L 27.0    Creatinine 0.55 - 1.02 mg/dL 1.61    BUN 9 - 23 mg/dL 9    Glucose 70 - 99 mg/dL 90    Albumin 3.4 - 5.0 g/dL 3.0 (L)    Alk Phos 46 - 116 U/L 77    ALT 10 - 49 U/L 19    AST <=34 U/L 19    Total Bili 0.3 - 1.2 mg/dL 0.9    Ca 8.7 - 09.6 mg/dL 8.1 (L)    GGT 0 - 38 U/L 65 (H)    Mg 1.6 - 2.6 mg/dL 1.7    PO4 2.4 - 5.1 mg/dL 3.1    Total Protein 5.7 - 8.2 g/dL 5.5 (L)       Assessment and Plan  42F history of OLT for 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 Overall recovering with reassuring graft function.  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 We will plan to hold her Cellcept; she will be direct admitted for pRBC transfusion.  -Continue lovenox for now, as anemia is more likely to be from cellcept than acute blood loss  anemia  -HOLD cellcept. Continue tacrolimus 6 mg PO BID, pred 5 mg daily     Pricilla Riffle, MD

## 2023-03-08 NOTE — Unmapped (Signed)
VENOUS ACCESS TEAM PROCEDURE    Nurse request was placed for a PIV by Venous Access Team (VAT).  Patient was assessed at bedside for placement of a PIV. PPE were donned per protocol.  Access was obtained with a surface vein in left forearm. Blood return noted, non-pulsatile.   Dressing intact and device well secured.  Flushed with 20ml normal saline.  See LDA for details.  Pt advised to inform RN of any s/s of discomfort at the PIV site.    Workup / Procedure Time:  15 minutes       Primary RN was notified.       Thank you,     Emily Filbert, RN Venous Access Team

## 2023-03-08 NOTE — Unmapped (Signed)
Surgery History and Physical Note      Attending Physician:  Edrick Kins, MD  Inpatient Service:  [facility])  Date: 03/08/2023      Assessment :  Alicia Armstrong is a 47 y.o. 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 Edrick Kins, MD.       Plan:  - Admit to [facility], Observation status  - T&S  - 1u pRBCs  - Hold Cellcept  - Conitnue Tac and prednisone  - Continue lovenox  - Regular diet    History of Present Illness:   Alicia Armstrong is a 47 y.o. 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 She was re-admitted 7/6-7/9 for back pain which has since resolved. 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 Her LFTs have conitnued to be normal.     She was seen today in clinic and doing well with no acute concerns. However, her labs were concerning for cytopenia w/ Hgb of 6.9. This is likely iso cellcept.     Otherwise, she denies any sick contacts, fevers, chills, N/V or fatigue.     Allergies  Allergies   Allergen Reactions    Penicillins Other (See Comments)     Had a rash with penicillin when she was 47 years old. She tolerated piperacillin/tazobactam 08/25/2016-08/28/2016 with itching without rash.         Medications    No current facility-administered medications on file prior to encounter.     Current Outpatient Medications on File Prior to Encounter   Medication Sig Dispense Refill    acetaminophen (TYLENOL) 325 MG tablet Take 1-2 tablets (325-650 mg total) by mouth every eight (8) hours as needed for pain or fever. 60 tablet 11    aspirin (ECOTRIN) 81 MG tablet Take 1 tablet (81 mg total) by mouth daily. 90 tablet 3    blood sugar diagnostic (GLUCOSE BLOOD) Strp Use to check blood sugar as directed with insulin 3 times a day & for symptoms of high or low blood sugar. 100 strip 0    blood-glucose meter kit Use as instructed 1 each 0    blood-glucose meter Misc Use as directed 1 each 0    calcium carbonate (TUMS) 200 mg calcium (500 mg) chewable tablet Chew 1 tablet (500 mg total) Three (3) times a day. 150 tablet 3    docusate sodium (COLACE) 100 MG capsule Take 1 capsule (100 mg total) by mouth two (2) times a day as needed for constipation. 60 capsule 11    enoxaparin (LOVENOX) 30 mg/0.3 mL Syrg Inject 0.3 mL (30 mg total) under the skin two (2) times a day. 18 mL 1    gabapentin (NEURONTIN) 400 MG capsule Take 1 capsule (400 mg total) by mouth Three (3) times a day. 90 capsule 3    lancets Misc Use to check blood sugar as directed with insulin 3 times a day & for symptoms of high or low blood sugar. 100 each 0    letermovir (PREVYMIS) 480 mg tablet Take 1 tablet (480 mg total) by mouth daily. 30 tablet 2    magnesium oxide-Mg AA chelate (MAGNESIUM, AMINO ACID CHELATE,) 133 mg Take 1 tablet by mouth two (2) times a day. 100 tablet 6    methocarbamol (ROBAXIN) 500 MG tablet Take 1 tablet (500 mg total) by mouth Three (3) times a  day as needed. 90 tablet 11    mycophenolate (CELLCEPT) 250 mg capsule Hold (as of 03/08/23) 60 capsule 11    pantoprazole (PROTONIX) 40 MG tablet Take 1 tablet (40 mg total) by mouth daily. 30 tablet 11    pen needle, diabetic 32 gauge x 5/32 (4 mm) Ndle Use with insulin up to 4 times/day as needed. 100 each 0    [START ON 03/20/2023] pentamidine in sterile water Inhale 6 mL (300 mg total) every twenty-eight (28) days.      polyethylene glycol (MIRALAX) 17 gram packet Mix 1 packet in  4 to 8 ounces of liquid and drink daily as needed. 30 packet 11    predniSONE (DELTASONE) 5 MG tablet Take 1 tablet (5 mg total) by mouth daily. 90 tablet 3    SITagliptin phosphate (JANUVIA) 100 MG tablet Take 1 tablet (100 mg total) by mouth daily. 90 tablet 3    tacrolimus (PROGRAF) 1 MG capsule Take 6 capsules (6 mg total) by mouth two (2) times a day. Start on 7/10 600 capsule 11    valGANciclovir (VALCYTE) 450 mg tablet Take 1 tablet (450 mg total) by mouth daily. 30 tablet 2         Past Medical History  Past Medical History:   Diagnosis Date    Cirrhosis (CMS-HCC)          Past Surgical History  Past Surgical History:   Procedure Laterality Date    CESAREAN SECTION      PR COLSC FLX W/RMVL OF TUMOR POLYP LESION SNARE TQ N/A 09/11/2022    Procedure: COLONOSCOPY FLEX; W/REMOV TUMOR/LES BY SNARE;  Surgeon: Mickie Hillier, MD;  [facility];  Service: Gastroenterology    PR REMV VEIN CLOT CAVA-ILIAC,LEG INCIS N/A 02/14/2023    Procedure: THROMBEC DIRECT OR W/CATH; VENA CAVA BY LEG INCS;  Surgeon: Florene Glen, MD;  Location: MAIN OR [place];  Service: Transplant    PR TRANSPLANT LIVER,ALLOTRANSPLANT Bilateral 02/14/2023    Procedure: LIVER ALLOTRANSPLANTATION; ORTHOTOPIC, PARTIAL OR WHOLE, FROM CADAVER OR LIVING DONOR, ANY AGE;  Surgeon: Florene Glen, MD;  Location: MAIN OR [place];  Service: Transplant    PR TRANSPLANT,PREP DONOR LIVER, WHOLE N/A 02/14/2023    Procedure: [facility] STD PREP CAD DONOR WHOLE LIVER GFT PRIOR TNSPLNT,INC CHOLE,DISS/REM SURR TISSU WO TRISEG/LOBE SPLT;  Surgeon: Florene Glen, MD;  Location: MAIN OR [place];  Service: Transplant    PR UPPER GI ENDOSCOPY,DIAGNOSIS N/A 04/02/2017    Procedure: UGI ENDO, INCLUDE ESOPHAGUS, STOMACH, & DUODENUM &/OR JEJUNUM; DX W/WO COLLECTION SPECIMN, BY BRUSH OR WASH;  Surgeon: Janyth Pupa, MD;  Location: [facility];  Service: Gastroenterology    PR UPPER GI ENDOSCOPY,DIAGNOSIS N/A 09/11/2022    Procedure: UGI ENDO, INCLUDE ESOPHAGUS, STOMACH, & DUODENUM &/OR JEJUNUM; DX W/WO COLLECTION SPECIMN, BY BRUSH OR WASH;  Surgeon: Mickie Hillier, MD;  Location: [facility];  Service: Gastroenterology         Family History  Family History   Problem Relation Age of Onset    No Known Problems Mother     Diabetes Father     No Known Problems Sister     No Known Problems Daughter     No Known Problems Maternal Grandmother     No Known Problems Maternal Grandfather     No Known Problems Paternal Grandmother     No Known Problems Paternal Grandfather     BRCA 1/2 Neg Hx  Breast cancer Neg Hx     Cancer Neg Hx     Colon cancer Neg Hx     Endometrial cancer Neg Hx     Ovarian cancer Neg Hx          Social History:  Social History     Tobacco Use    Smoking status: Never    Smokeless tobacco: Never   Vaping Use    Vaping status: Never Used   Substance Use Topics    Alcohol use: No     Alcohol/week: 0.0 standard drinks of alcohol    Drug use: No         Review of Systems  A 12 system review of systems was negative except as noted in HPI.      Vital Signs    Patient Vitals for the past 8 hrs:   BP Temp Pulse SpO2 Pulse Resp SpO2 Height Weight   03/08/23 1306 133/76 37 ??C (98.6 ??F) 85 100 18 100 % 154.9 cm (5' 0.98) 73.4 kg (161 lb 13.1 oz)       Physical Exam  General Appearance: female in no acute distress. Alert and oriented x 3.   Head:  Normocephalic, atraumatic.  Eyes: Conjunctiva and lids appear normal. Pupils equal, round, and reactive to light. Sclera anicteric.  Nose: Nares grossly normal, no drainage.  Pulmonary: Normal respiratory effort on RA  Cardiovascular: RRR  Abdomen: Soft, non-tender, without masses. Incision well healing.   Musculoskeletal: Extremities without clubbing, cyanosis or edema.  Neurologic:  No motor abnormalities noted. Sensation grossly intact.  Skin:  Skin color normal. No rashes or lesions. No Jaundice  Psychiatric: Judgement and insight seem appropriate. Oriented to person, place and time.    Labs and Studies  Labs:  Recent Results (from the past 24 hour(s))   Comprehensive Metabolic Panel    Collection Time: 03/08/23  8:59 AM   Result Value Ref Range    Sodium 140 135 - 145 mmol/L    Potassium 4.6 3.4 - 4.8 mmol/L    Chloride 107 98 - 107 mmol/L    CO2 27.0 20.0 - 31.0 mmol/L    Anion Gap 6 5 - 14 mmol/L    BUN 9 9 - 23 mg/dL    Creatinine 6.21 3.08 - 1.02 mg/dL    BUN/Creatinine Ratio 12     eGFR CKD-EPI (2021) Female >90 >=60 mL/min/1.60m2    Glucose 90 70 - 99 mg/dL    Calcium 8.1 (L) 8.7 - 10.4 mg/dL    Albumin 3.0 (L) 3.4 - 5.0 g/dL    Total Protein 5.5 (L) 5.7 - 8.2 g/dL    Total Bilirubin 0.9 0.3 - 1.2 mg/dL    AST 19 <=65 U/L    ALT 19 10 - 49 U/L    Alkaline Phosphatase 77 46 - 116 U/L   Phosphorus Level    Collection Time: 03/08/23  8:59 AM   Result Value Ref Range    Phosphorus 3.1 2.4 - 5.1 mg/dL   Bilirubin, Direct    Collection Time: 03/08/23  8:59 AM   Result Value Ref Range    Bilirubin, Direct 0.50 (H) 0.00 - 0.30 mg/dL   Gamma GT (GGT)    Collection Time: 03/08/23  8:59 AM   Result Value Ref Range    GGT 65 (H) 0 - 38 U/L   Magnesium Level    Collection Time: 03/08/23  8:59 AM   Result Value Ref Range    Magnesium 1.7  1.6 - 2.6 mg/dL   Tacrolimus level    Collection Time: 03/08/23  8:59 AM   Result Value Ref Range    Tacrolimus, Timed 5.5 ng/mL   CBC w/ Differential    Collection Time: 03/08/23  8:59 AM   Result Value Ref Range    WBC 1.0 (L) 3.6 - 11.2 10*9/L    RBC 2.29 (L) 3.95 - 5.13 10*12/L    HGB 6.9 (L) 11.3 - 14.9 g/dL    HCT 16.1 (L) 09.6 - 44.0 %    MCV 88.8 77.6 - 95.7 fL    MCH 30.1 25.9 - 32.4 pg    MCHC 33.9 32.0 - 36.0 g/dL    RDW 04.5 (H) 40.9 - 15.2 %    MPV 7.7 6.8 - 10.7 fL    Platelet 120 (L) 150 - 450 10*9/L    Neutrophils % 52.5 %    Lymphocytes % 24.3 %    Monocytes % 15.7 %    Eosinophils % 5.4 %    Basophils % 2.1 %    Absolute Neutrophils 0.5 (L) 1.8 - 7.8 10*9/L    Absolute Lymphocytes 0.2 (L) 1.1 - 3.6 10*9/L    Absolute Monocytes 0.2 (L) 0.3 - 0.8 10*9/L    Absolute Eosinophils 0.1 0.0 - 0.5 10*9/L    Absolute Basophils 0.0 0.0 - 0.1 10*9/L    Anisocytosis Slight (A) Not Present   Ferritin    Collection Time: 03/08/23  8:59 AM   Result Value Ref Range    Ferritin 396.4 (H) 7.3 - 270.7 ng/mL   Reticulocytes    Collection Time: 03/08/23  8:59 AM   Result Value Ref Range    Reticulocyte Auto % 3.37 (H) 0.50 - 2.17 %    Absolute Auto Reticulocyte 75.8 23.0 - 100.0 10*9/L Type and Screen    Collection Time: 03/08/23  1:11 PM   Result Value Ref Range    ABO Grouping O POS     Antibody Screen NEG    LMWH Anti-Xa    Collection Time: 03/08/23  1:11 PM   Result Value Ref Range    LMWH Anti-Xa 0.09 IU/mL   Prepare RBC    Collection Time: 03/08/23  3:46 PM   Result Value Ref Range    PRODUCT CODE [id number]     Product ID Red Blood Cells     Spec Expiration [id number]     Status Ready for issue     Unit # [id number]     Unit Blood Type O Pos     ISBT Number 5100     Crossmatch Compatible

## 2023-03-08 NOTE — Unmapped (Addendum)
7/15: patient is currently admitted. Reached out to cpp LC to verify if/when to reach out to patient. Will wait for clinic direction before proceeding -ef      [facility] Medication Onboarding    Specialty Medication: PREVYMIS 480 mg tablet (letermovir)  Prior Authorization: Not Required   Financial Assistance: No - copay  <$25  Final Copay/Day Supply: $4 / 28    Insurance Restrictions: None     Notes to Pharmacist:   Credit Card on File: no    The triage team has completed the benefits investigation and has determined that the patient is able to fill this medication at [facility]. Please contact the patient to complete the onboarding or follow up with the prescribing physician as needed.

## 2023-03-09 DIAGNOSIS — Z944 Liver transplant status: Principal | ICD-10-CM

## 2023-03-09 DIAGNOSIS — Z7189 Other specified counseling: Principal | ICD-10-CM

## 2023-03-09 LAB — IONIZED CALCIUM VENOUS: CALCIUM IONIZED VENOUS (MG/DL): 4.66 mg/dL (ref 4.40–5.40)

## 2023-03-09 LAB — CBC W/ AUTO DIFF
BASOPHILS ABSOLUTE COUNT: 0 10*9/L (ref 0.0–0.1)
BASOPHILS ABSOLUTE COUNT: 0 10*9/L (ref 0.0–0.1)
BASOPHILS RELATIVE PERCENT: 2.3 %
BASOPHILS RELATIVE PERCENT: 0.8 %
EOSINOPHILS ABSOLUTE COUNT: 0.1 10*9/L (ref 0.0–0.5)
EOSINOPHILS ABSOLUTE COUNT: 0.1 10*9/L (ref 0.0–0.5)
EOSINOPHILS RELATIVE PERCENT: 6.8 %
EOSINOPHILS RELATIVE PERCENT: 3.4 %
HEMATOCRIT: 21.3 % — ABNORMAL LOW (ref 34.0–44.0)
HEMATOCRIT: 25.4 % — ABNORMAL LOW (ref 34.0–44.0)
HEMOGLOBIN: 7.2 g/dL — ABNORMAL LOW (ref 11.3–14.9)
HEMOGLOBIN: 8.7 g/dL — ABNORMAL LOW (ref 11.3–14.9)
LYMPHOCYTES ABSOLUTE COUNT: 0.2 10*9/L — ABNORMAL LOW (ref 1.1–3.6)
LYMPHOCYTES ABSOLUTE COUNT: 0.2 10*9/L — ABNORMAL LOW (ref 1.1–3.6)
LYMPHOCYTES RELATIVE PERCENT: 23.6 %
LYMPHOCYTES RELATIVE PERCENT: 9.4 %
MEAN CORPUSCULAR HEMOGLOBIN CONC: 33.8 g/dL (ref 32.0–36.0)
MEAN CORPUSCULAR HEMOGLOBIN CONC: 34.3 g/dL (ref 32.0–36.0)
MEAN CORPUSCULAR HEMOGLOBIN: 30.1 pg (ref 25.9–32.4)
MEAN CORPUSCULAR HEMOGLOBIN: 30.5 pg (ref 25.9–32.4)
MEAN CORPUSCULAR VOLUME: 89.2 fL (ref 77.6–95.7)
MEAN CORPUSCULAR VOLUME: 88.9 fL (ref 77.6–95.7)
MEAN PLATELET VOLUME: 7.9 fL (ref 6.8–10.7)
MEAN PLATELET VOLUME: 8.1 fL (ref 6.8–10.7)
MONOCYTES ABSOLUTE COUNT: 0.2 10*9/L — ABNORMAL LOW (ref 0.3–0.8)
MONOCYTES ABSOLUTE COUNT: 0.3 10*9/L (ref 0.3–0.8)
MONOCYTES RELATIVE PERCENT: 15.4 %
MONOCYTES RELATIVE PERCENT: 11 %
NEUTROPHILS ABSOLUTE COUNT: 0.5 10*9/L — ABNORMAL LOW (ref 1.8–7.8)
NEUTROPHILS ABSOLUTE COUNT: 1.9 10*9/L (ref 1.8–7.8)
NEUTROPHILS RELATIVE PERCENT: 51.9 %
NEUTROPHILS RELATIVE PERCENT: 75.4 %
NUCLEATED RED BLOOD CELLS: 1 /100{WBCs} (ref <=4)
PLATELET COUNT: 109 10*9/L — ABNORMAL LOW (ref 150–450)
PLATELET COUNT: 113 10*9/L — ABNORMAL LOW (ref 150–450)
RED BLOOD CELL COUNT: 2.39 10*12/L — ABNORMAL LOW (ref 3.95–5.13)
RED BLOOD CELL COUNT: 2.86 10*12/L — ABNORMAL LOW (ref 3.95–5.13)
RED CELL DISTRIBUTION WIDTH: 17.1 % — ABNORMAL HIGH (ref 12.2–15.2)
RED CELL DISTRIBUTION WIDTH: 16.7 % — ABNORMAL HIGH (ref 12.2–15.2)
WBC ADJUSTED: 1 10*9/L — ABNORMAL LOW (ref 3.6–11.2)
WBC ADJUSTED: 2.5 10*9/L — ABNORMAL LOW (ref 3.6–11.2)

## 2023-03-09 LAB — BASIC METABOLIC PANEL
ANION GAP: 5 mmol/L (ref 5–14)
BLOOD UREA NITROGEN: 11 mg/dL (ref 9–23)
BUN / CREAT RATIO: 15
CALCIUM: 8.3 mg/dL — ABNORMAL LOW (ref 8.7–10.4)
CHLORIDE: 107 mmol/L (ref 98–107)
CO2: 27 mmol/L (ref 20.0–31.0)
CREATININE: 0.72 mg/dL
EGFR CKD-EPI (2021) FEMALE: >90 mL/min/{1.73_m2} (ref >=60)
GLUCOSE RANDOM: 112 mg/dL (ref 70–179)
POTASSIUM: 4.4 mmol/L (ref 3.4–4.8)
SODIUM: 139 mmol/L (ref 135–145)

## 2023-03-09 LAB — HEPATIC FUNCTION PANEL
ALBUMIN: 2.8 g/dL — ABNORMAL LOW (ref 3.4–5.0)
ALKALINE PHOSPHATASE: 75 U/L (ref 46–116)
ALT (SGPT): 18 U/L (ref 10–49)
AST (SGOT): 19 U/L (ref <=34)
BILIRUBIN DIRECT: 0.5 mg/dL — ABNORMAL HIGH (ref 0.00–0.30)
BILIRUBIN TOTAL: 0.8 mg/dL (ref 0.3–1.2)
PROTEIN TOTAL: 5.2 g/dL — ABNORMAL LOW (ref 5.7–8.2)

## 2023-03-09 LAB — EBV QUANTITATIVE PCR, BLOOD: EBV VIRAL LOAD RESULT: NOT DETECTED

## 2023-03-09 LAB — CMV DNA, QUANTITATIVE, PCR: CMV VIRAL LD: NOT DETECTED

## 2023-03-09 LAB — PHOSPHORUS: PHOSPHORUS: 3.6 mg/dL (ref 2.4–5.1)

## 2023-03-09 LAB — TACROLIMUS LEVEL, TROUGH: TACROLIMUS, TROUGH: 10.4 ng/mL (ref 5.0–15.0)

## 2023-03-09 LAB — SLIDE REVIEW

## 2023-03-09 LAB — MAGNESIUM: MAGNESIUM: 1.7 mg/dL (ref 1.6–2.6)

## 2023-03-09 MED ADMIN — lidocaine 4 % patch 2 patch: 2 | TRANSDERMAL | @ 22:00:00 | NDC 63323047930

## 2023-03-09 MED ADMIN — magnesium oxide-Mg AA chelate (Magnesium Plus Protein) 1 tablet: 1 | ORAL | NDC 75834005001

## 2023-03-09 MED ADMIN — pantoprazole (Protonix) EC tablet 40 mg: 40 mg | ORAL | @ 13:00:00 | NDC 82009001190

## 2023-03-09 MED ADMIN — tacrolimus (PROGRAF) capsule 8 mg: 8 mg | ORAL | @ 13:00:00 | NDC 00469301601

## 2023-03-09 MED ADMIN — calcium carbonate (TUMS) chewable tablet 200 mg elem calcium: 200 mg | ORAL | @ 18:00:00

## 2023-03-09 MED ADMIN — valGANciclovir (VALCYTE) tablet 450 mg: 450 mg | ORAL | @ 13:00:00 | NDC 69097027703

## 2023-03-09 MED ADMIN — gabapentin (NEURONTIN) capsule 400 mg: 400 mg | ORAL | NDC 76420023490

## 2023-03-09 MED ADMIN — predniSONE (DELTASONE) tablet 5 mg: 5 mg | ORAL | @ 13:00:00 | NDC 55289085902

## 2023-03-09 MED ADMIN — filgrastim-ayow (RELEUKO) injection syringe 300 mcg: 300 ug | SUBCUTANEOUS | @ 14:00:00 | Stop: 2023-03-09 | NDC 55513053010

## 2023-03-09 MED ADMIN — methocarbamol (ROBAXIN) 1,000 mg in sodium chloride (NS) 0.9 % 50 mL IVPB: 1000 mg | INTRAVENOUS | @ 22:00:00 | NDC 71288071611

## 2023-03-09 MED ADMIN — enoxaparin (LOVENOX) syringe 30 mg: 30 mg | SUBCUTANEOUS | NDC 81952012323

## 2023-03-09 MED ADMIN — gabapentin (NEURONTIN) capsule 400 mg: 400 mg | ORAL | @ 18:00:00 | NDC 76420023490

## 2023-03-09 MED ADMIN — methocarbamol (ROBAXIN) tablet 500 mg: 500 mg | ORAL | @ 18:00:00 | Stop: 2023-03-09 | NDC 71288071611

## 2023-03-09 MED ADMIN — tacrolimus (PROGRAF) capsule 8 mg: 8 mg | ORAL | NDC 00469301601

## 2023-03-09 MED ADMIN — magnesium oxide-Mg AA chelate (Magnesium Plus Protein) 1 tablet: 1 | ORAL | @ 13:00:00 | NDC 75834005001

## 2023-03-09 MED ADMIN — acetaminophen (TYLENOL) tablet 650 mg: 650 mg | ORAL | @ 16:00:00 | NDC 50580048790

## 2023-03-09 MED ADMIN — calcium carbonate (TUMS) chewable tablet 200 mg elem calcium: 200 mg | ORAL | @ 13:00:00

## 2023-03-09 MED ADMIN — aspirin chewable tablet 81 mg: 81 mg | ORAL | @ 13:00:00 | NDC 96295013557

## 2023-03-09 MED ADMIN — enoxaparin (LOVENOX) syringe 30 mg: 30 mg | SUBCUTANEOUS | @ 13:00:00 | Stop: 2023-03-09 | NDC 81952012323

## 2023-03-09 MED ADMIN — methocarbamol (ROBAXIN) tablet 500 mg: 500 mg | ORAL | @ 13:00:00 | Stop: 2023-03-09 | NDC 71288071611

## 2023-03-09 MED ADMIN — methocarbamol (ROBAXIN) tablet 500 mg: 500 mg | ORAL | NDC 71288071611

## 2023-03-09 MED ADMIN — acetaminophen (TYLENOL) tablet 650 mg: 650 mg | ORAL | @ 10:00:00 | NDC 50580048790

## 2023-03-09 MED ADMIN — calcium carbonate (TUMS) chewable tablet 200 mg elem calcium: 200 mg | ORAL

## 2023-03-09 MED ADMIN — gabapentin (NEURONTIN) capsule 400 mg: 400 mg | ORAL | @ 13:00:00 | NDC 76420023490

## 2023-03-09 NOTE — Unmapped (Addendum)
Social Work  Psychosocial Assessment    Patient Name: Alicia Armstrong   Medical Record Number: [id number]   Date of Birth: 02/06/1976  Sex: Female     Referral  Referred by: Care Manager  Reason for Referral: Complex Discharge Planning  No Psychosocial Interventions Necessary: No Psychosocial Interventions Necessary    Extended Emergency Contact Information  Primary Emergency Contact: Panas,Ammy  Mobile Phone: [phone]  Relation: Daughter  Secondary Emergency Contact: Sortor,Gerson  Home Phone: [phone]  Mobile Phone: [phone]  Relation: Son    Legal Next of Kin / Guardian / [place] / Advance Directives    HCDM (patient stated preference): Whittle,Paulino - Spouse - [phone]    HCDM, back-up (If primary HCDM is unavailable): Careias,Freddy - Other [phone]    Advance Directive (Medical Treatment)  Does patient have an advance directive covering medical treatment?: Patient does not have advance directive covering medical treatment.  Reason patient does not have an advance directive covering medical treatment:: Patient does not wish to complete one at this time.    Health Care Decision Maker [HCDM] (Medical & Mental Health Treatment)  Healthcare Decision Maker: Patient does not wish to appoint a Health Care Decision Maker at this time  Information offered on HCDM, Medical & Mental Health advance directives:: Patient declined information.    Advance Directive (Mental Health Treatment)  Does patient have an advance directive covering mental health treatment?: Patient has advance directive covering mental health treatment, copy in chart.  Reason patient does not have an advance directive covering mental health treatment:: HCDM documented in the HCDM/Contact Info section.    Discharge Planning  Discharge Planning Information:   Type of Residence   Mailing Address:  [address]  [place] [address]    Medical Information   Past Medical History:   Diagnosis Date    Cirrhosis (CMS-HCC)        Past Surgical History:   Procedure Laterality Date    CESAREAN SECTION      PR COLSC FLX W/RMVL OF TUMOR POLYP LESION SNARE TQ N/A 09/11/2022    Procedure: COLONOSCOPY FLEX; W/REMOV TUMOR/LES BY SNARE;  Surgeon: Mickie Hillier, MD;  [facility];  Service: Gastroenterology    PR REMV VEIN CLOT CAVA-ILIAC,LEG INCIS N/A 02/14/2023    Procedure: THROMBEC DIRECT OR W/CATH; VENA CAVA BY LEG INCS;  Surgeon: Florene Glen, MD;  Location: MAIN OR [place];  Service: Transplant    PR TRANSPLANT LIVER,ALLOTRANSPLANT Bilateral 02/14/2023    Procedure: LIVER ALLOTRANSPLANTATION; ORTHOTOPIC, PARTIAL OR WHOLE, FROM CADAVER OR LIVING DONOR, ANY AGE;  Surgeon: Florene Glen, MD;  Location: MAIN OR [place];  Service: Transplant    PR TRANSPLANT,PREP DONOR LIVER, WHOLE N/A 02/14/2023    Procedure: [facility] STD PREP CAD DONOR WHOLE LIVER GFT PRIOR TNSPLNT,INC CHOLE,DISS/REM SURR TISSU WO TRISEG/LOBE SPLT;  Surgeon: Florene Glen, MD;  Location: MAIN OR [place];  Service: Transplant    PR UPPER GI ENDOSCOPY,DIAGNOSIS N/A 04/02/2017    Procedure: UGI ENDO, INCLUDE ESOPHAGUS, STOMACH, & DUODENUM &/OR JEJUNUM; DX W/WO COLLECTION SPECIMN, BY BRUSH OR WASH;  Surgeon: Janyth Pupa, MD;  [facility];  Service: Gastroenterology    PR UPPER GI ENDOSCOPY,DIAGNOSIS N/A 09/11/2022    Procedure: UGI ENDO, INCLUDE ESOPHAGUS, STOMACH, & DUODENUM &/OR JEJUNUM; DX W/WO COLLECTION SPECIMN, BY BRUSH OR WASH;  Surgeon: Mickie Hillier, MD;  Location: [facility];  Service: Gastroenterology       Family History  Problem Relation Age of Onset    No Known Problems Mother     Diabetes Father     No Known Problems Sister     No Known Problems Daughter     No Known Problems Maternal Grandmother     No Known Problems Maternal Grandfather     No Known Problems Paternal Grandmother     No Known Problems Paternal Grandfather     BRCA 1/2 Neg Hx     Breast cancer Neg Hx Cancer Neg Hx     Colon cancer Neg Hx     Endometrial cancer Neg Hx     Ovarian cancer Neg Hx        Financial Information   Primary Insurance: Payor: BCBS / Plan: BCBS OOS PLAN / Product Type: *No Product type* /    Secondary Insurance: Secondary Insurance  [place] MGD CAID [facility]*   Prescription Coverage: Nurse, learning disability (listed above)   Preferred Pharmacy: [id number] - [place], [place] - [address] AT [address]OSS [address]  [facility]  [place] [facility]    Barriers to taking medication: No    Transition Home   Transportation at time of discharge: Family/Friend's Private Vehicle    Anticipated changes related to Illness: none   Services in place prior to admission: N/A   Services anticipated for DC: N/A   Hemodialysis Prior to Admission: No    Readmission  Risk of Unplanned Readmission Score:  %  Readmitted Within the Last 30 Days?   Readmission Factors include: S/p transplant on 02/15/2023 (Liver)     Social Determinants of Health  Social Determinants of Health     Financial Resource Strain: Low Risk  (02/23/2023)    Overall Financial Resource Strain (CARDIA)     Difficulty of Paying Living Expenses: Not very hard   Internet Connectivity: No Internet connectivity concern identified (02/23/2023)    Internet Connectivity     Do you have access to internet services: Yes     How do you connect to the internet: Personal Device at home     Is your internet connection strong enough for you to watch video on your device without major problems?: Yes     Do you have enough data to get through the month?: Yes     Does at least one of the devices have a camera that you can use for video chat?: Yes   Food Insecurity: No Food Insecurity (02/23/2023)    Hunger Vital Sign     Worried About Running Out of Food in the Last Year: Never true     Ran Out of Food in the Last Year: Never true   Tobacco Use: Low Risk  (03/08/2023)    Patient History     Smoking Tobacco Use: Never     Smokeless Tobacco Use: Never     Passive Exposure: Not on file   Housing/Utilities: Low Risk  (02/23/2023)    Housing/Utilities     Within the past 12 months, have you ever stayed: outside, in a car, in a tent, in an overnight shelter, or temporarily in someone else's home (i.e. couch-surfing)?: No     Are you worried about losing your housing?: No     Within the past 12 months, have you been unable to get utilities (heat, electricity) when it was really needed?: No   Alcohol Use: Not At Risk (02/23/2023)    Alcohol Use  How often do you have a drink containing alcohol?: Never     How many drinks containing alcohol do you have on a typical day when you are drinking?: 1 - 2     How often do you have 5 or more drinks on one occasion?: Never   Transportation Needs: No Transportation Needs (02/23/2023)    PRAPARE - Transportation     Lack of Transportation (Medical): No     Lack of Transportation (Non-Medical): No   Substance Use: Low Risk  (02/23/2023)    Substance Use     Taken prescription drugs for non-medical reasons: Never     Taken illegal drugs: Never     Patient indicated they have taken drugs in the past year for non-medical reasons: Yes, [positive answer(s)]: Not on file   Health Literacy: Medium Risk (02/23/2023)    Health Literacy     : Sometimes   Physical Activity: Inactive (02/23/2023)    Exercise Vital Sign     Days of Exercise per Week: 0 days     Minutes of Exercise per Session: 0 min   Interpersonal Safety: Not At Risk (02/23/2023)    Interpersonal Safety     Unsafe Where You Currently Live: No     Physically Hurt by Anyone: No     Abused by Anyone: No   Stress: No Stress Concern Present (02/23/2023)    [employer] of Occupational Health - Occupational Stress Questionnaire     Feeling of Stress : Not at all   Intimate Partner Violence: Not At Risk (02/23/2023)    Humiliation, Afraid, Rape, and Kick questionnaire     Fear of Current or Ex-Partner: No     Emotionally Abused: No     Physically Abused: No     Sexually Abused: No   Depression: Not at risk (02/23/2023)    PHQ-2     PHQ-2 Score: 0   Social Connections: Socially Integrated (02/23/2023)    Social Connection and Isolation Panel [NHANES]     Frequency of Communication with Friends and Family: More than three times a week     Frequency of Social Gatherings with Friends and Family: More than three times a week     Attends Religious Services: More than 4 times per year     Active Member of [employer] or Organizations: Yes     Attends Engineer, structural: More than 4 times per year     Marital Status: Married       Social History  Support Systems/Concerns: Family Members       Hotel manager Service: No Marine scientist and Psychiatric History  Psychosocial Stressors: Denies      Psychological Issues/Information: No issues      Chemical Dependency: None      Outpatient Providers: Specialist      Legal: No legal issues      Ability to [employer]: No issues accessing community services

## 2023-03-09 NOTE — Unmapped (Signed)
Problem: Adult Inpatient Plan of Care  Goal: Plan of Care Review  Outcome: Progressing  Goal: Patient-Specific Goal (Individualized)  Outcome: Progressing  Goal: Absence of Hospital-Acquired Illness or Injury  Outcome: Progressing  Intervention: Identify and Manage Fall Risk  Recent Flowsheet Documentation  Taken 03/09/2023 1400 by Dian Situ, RN  Safety Interventions:   fall reduction program maintained   low bed  Taken 03/09/2023 1200 by Dian Situ, RN  Safety Interventions:   fall reduction program maintained   low bed  Taken 03/09/2023 1000 by Dian Situ, RN  Safety Interventions:   fall reduction program maintained   low bed  Taken 03/09/2023 0800 by Dian Situ, RN  Safety Interventions:   low bed   fall reduction program maintained  Intervention: Prevent Skin Injury  Recent Flowsheet Documentation  Taken 03/09/2023 1400 by Dian Situ, RN  Positioning for Skin: Supine/Back  Taken 03/09/2023 1200 by Dian Situ, RN  Positioning for Skin: Sitting in Chair  Taken 03/09/2023 1000 by Dian Situ, RN  Positioning for Skin: Supine/Back  Taken 03/09/2023 0800 by Dian Situ, RN  Positioning for Skin: Supine/Back  Goal: Optimal Comfort and Wellbeing  Outcome: Progressing  Goal: Readiness for Transition of Care  Outcome: Progressing  Goal: Rounds/Family Conference  Outcome: Progressing     Problem: Wound  Goal: Optimal Coping  Outcome: Progressing  Goal: Optimal Functional Ability  Outcome: Progressing  Intervention: Optimize Functional Ability  Recent Flowsheet Documentation  Taken 03/09/2023 0800 by Dian Situ, RN  Activity Management: up to bedside commode  Goal: Absence of Infection Signs and Symptoms  Outcome: Progressing  Goal: Improved Oral Intake  Outcome: Progressing  Goal: Optimal Pain Control and Function  Outcome: Progressing  Goal: Skin Health and Integrity  Outcome: Progressing  Intervention: Optimize Skin Protection  Recent Flowsheet Documentation  Taken 03/09/2023 1400 by Dian Situ, RN  Pressure Reduction Techniques: frequent weight shift encouraged  Pressure Reduction Devices: pressure-redistributing mattress utilized  Taken 03/09/2023 1000 by Dian Situ, RN  Pressure Reduction Techniques: frequent weight shift encouraged  Pressure Reduction Devices: pressure-redistributing mattress utilized  Taken 03/09/2023 0800 by Dian Situ, RN  Activity Management: up to bedside commode  Pressure Reduction Techniques: frequent weight shift encouraged  Pressure Reduction Devices: pressure-redistributing mattress utilized  Goal: Optimal Wound Healing  Outcome: Progressing

## 2023-03-09 NOTE — Unmapped (Signed)
COMMUNITY HEALTH WORKER  Outreach Note    03/09/2023  Inform/Regulatory Low priority, no response needed unless change in care.     I am reaching out to Alicia Armstrong as part of the community health worker team regarding her Resource Coordination.    Patient advised to  Reach out to [employer]  for [employer]  .      Next appt with PCP: N/a      See chart for med list if needed    Sharing communication as part of regulatory requirements.       Referral:    Referral received from: [facility]   Reason for referral: Resource Coordination    Assessment:    Patients Primary Concern: Transportation barriers  Patient Barriers:  Unstable to drive  Patient Strengths: Family connection and Self-advocacy    Additional Background Information provided by patient: Spoke with [name] who states she is currently hospitalized due to blood transfusion. Patient is requesting transportation resources to get back and fourth  three times a week for lab work .     Food Insecurity: No Food Insecurity (02/23/2023)    Hunger Vital Sign     Worried About Running Out of Food in the Last Year: Never true     Ran Out of Food in the Last Year: Never true     Transportation Needs: No Transportation Needs (02/23/2023)    PRAPARE - Therapist, art (Medical): No     Lack of Transportation (Non-Medical): No     Financial Resource Strain: Low Risk  (02/23/2023)    Overall Financial Resource Strain (CARDIA)     Difficulty of Paying Living Expenses: Not very hard     Housing/Utilities: Low Risk  (02/23/2023)    Housing/Utilities     Within the past 12 months, have you ever stayed: outside, in a car, in a tent, in an overnight shelter, or temporarily in someone else's home (i.e. couch-surfing)?: No     Are you worried about losing your housing?: No     Within the past 12 months, have you been unable to get utilities (heat, electricity) when it was really needed?: No     Health Literacy: Medium Risk (02/23/2023)    Health Literacy     : Sometimes      Social Connections: Socially Integrated (02/23/2023)    Social Connection and Isolation Panel [NHANES]     Frequency of Communication with Friends and Family: More than three times a week     Frequency of Social Gatherings with Friends and Family: More than three times a week     Attends Religious Services: More than 4 times per year     Active Member of [employer] or Organizations: Yes     Attends Engineer, structural: More than 4 times per year     Marital Status: Married          I provided an intervention for the Transportation Needs SDOH domain. The intervention was Provided community resources, Education, Supportive listening, and gave patient Medicaid number       Patient expressed understanding.    Next Follow-up: Next scheduled call is 03/26/23    Note Routed: Yes.  Routed to: Darnelle Spangle MD     Signed: Paulla Fore, CNA

## 2023-03-09 NOTE — Unmapped (Signed)
Tacrolimus Therapeutic Monitoring Pharmacy Note    Alicia Armstrong is a 47 y.o. female continuing tacrolimus.     Indication: Liver transplant     Date of Transplant:  02/15/2023       Prior Dosing Information: Current regimen tacrolimus 8 mg PO BID (dose increased 7/15 PM)      Goals:  Therapeutic Drug Levels  Tacrolimus trough goal: 8-10 ng/mL    Additional Clinical Monitoring/Outcomes  Monitor renal function (SCr and urine output) and liver function (LFTs)  Monitor for signs/symptoms of adverse events (e.g., hyperglycemia, hyperkalemia, hypomagnesemia, hypertension, headache, tremor)    Results:   Tacrolimus level:  10.4 ng/mL, drawn ~4 hours early    Pharmacokinetic Considerations and Significant Drug Interactions:  Concurrent hepatotoxic medications: None identified  Concurrent CYP3A4 substrates/inhibitors: None identified  Concurrent nephrotoxic medications: None identified    Assessment/Plan:  Recommendedation(s)  After discussion with SRF attending, will continue current regimen of 8 mg PO BID.    Follow-up  Daily levels have been ordered at 0600 .   A pharmacist will continue to monitor and recommend levels as appropriate    Please page service pharmacist with questions/clarifications.    Christene Lye, PharmD

## 2023-03-09 NOTE — Unmapped (Signed)
Problem: Adult Inpatient Plan of Care  Goal: Plan of Care Review  Outcome: Progressing  Goal: Patient-Specific Goal (Individualized)  Outcome: Progressing  Goal: Absence of Hospital-Acquired Illness or Injury  Outcome: Progressing  Intervention: Identify and Manage Fall Risk  Recent Flowsheet Documentation  Taken 03/08/2023 2200 by Crista Elliot, RN  Safety Interventions:   environmental modification   fall reduction program maintained   nonskid shoes/slippers when out of bed   lighting adjusted for tasks/safety   low bed  Intervention: Prevent Skin Injury  Recent Flowsheet Documentation  Taken 03/09/2023 0400 by Crista Elliot, RN  Positioning for Skin: Left  Taken 03/09/2023 0200 by Crista Elliot, RN  Positioning for Skin: Right  Taken 03/09/2023 0000 by Crista Elliot, RN  Positioning for Skin: Left  Taken 03/08/2023 2200 by Crista Elliot, RN  Positioning for Skin: Left  Taken 03/08/2023 2000 by Crista Elliot, RN  Positioning for Skin: Supine/Back  Device Skin Pressure Protection:   absorbent pad utilized/changed   adhesive use limited  Skin Protection:   adhesive use limited   incontinence pads utilized  Intervention: Prevent and Manage VTE (Venous Thromboembolism) Risk  Recent Flowsheet Documentation  Taken 03/08/2023 2200 by Crista Elliot, RN  Anti-Embolism Device Type: SCD, Knee  Anti-Embolism Intervention: On  Anti-Embolism Device Location: BLE  Goal: Optimal Comfort and Wellbeing  Outcome: Progressing  Goal: Readiness for Transition of Care  Outcome: Progressing  Goal: Rounds/Family Conference  Outcome: Progressing      Alert and oriented x 4, VSS, RA.  1 unit PRBC transfused with no adverse reactions. Hourly rounds performed and safety precautions maintained. No acute events overnight. Will continue to monitor.

## 2023-03-09 NOTE — Unmapped (Signed)
Enoxaparin Therapeutic Monitoring Pharmacy Note    Alicia Armstrong is a 47 y.o. female continuing enoxaparin.    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      Prior Dosing Information: Home regimen 30 mg subcutaneous every 12 hours    Goals:  Therapeutic Drug Levels  LMWH Anti-Xa level: 0.2-0.4 units/mL drawn 4-6 hours post-dose    Additional Clinical Monitoring/Outcomes  Monitor hemoglobin and platelets  Monitor for signs/symptoms of bleeding  Monitor renal function     Results:  LMWH Anti-Xa level 0.09 units/mL, drawn appropriately (per clinic team, patient took her enoxaparin dose at 0900 and so this level was drawn ~4 hours after last dose administered)    Wt Readings from Last 3 Encounters:   03/09/23 72.3 kg (159 lb 6.3 oz)   03/08/23 72.6 kg (160 lb)   03/01/23 77.2 kg (170 lb 1.6 oz)     HGB   Date Value Ref Range Status   03/09/2023 7.2 (L) 11.3 - 14.9 g/dL Final     Platelet   Date Value Ref Range Status   03/09/2023 109 (L) 150 - 450 10*9/L Final     Creatinine   Date Value Ref Range Status   03/09/2023 0.72 0.55 - 1.02 mg/dL Final       Pharmacokinetic Considerations and Significant Drug Interactions:   Concurrent antiplatelet medications: aspirin    Assessment/Plan:   Recommendation(s)  After discussion with SRF attending, will increase lovenox to 40 mg BID.     Follow-up  Level due: 4-6 hours after the 3rd dose on this new regimen  A pharmacist will continue to monitor and recommend levels as appropriate    Please page service pharmacist with questions/clarifications.    Christene Lye, PharmD, BCCCP

## 2023-03-10 LAB — CBC W/ AUTO DIFF
BASOPHILS ABSOLUTE COUNT: 0 10*9/L (ref 0.0–0.1)
BASOPHILS RELATIVE PERCENT: 1.1 %
EOSINOPHILS ABSOLUTE COUNT: 0.1 10*9/L (ref 0.0–0.5)
EOSINOPHILS RELATIVE PERCENT: 4.4 %
HEMATOCRIT: 24 % — ABNORMAL LOW (ref 34.0–44.0)
HEMOGLOBIN: 8.2 g/dL — ABNORMAL LOW (ref 11.3–14.9)
LYMPHOCYTES ABSOLUTE COUNT: 0.3 10*9/L — ABNORMAL LOW (ref 1.1–3.6)
LYMPHOCYTES RELATIVE PERCENT: 10.6 %
MEAN CORPUSCULAR HEMOGLOBIN CONC: 34.1 g/dL (ref 32.0–36.0)
MEAN CORPUSCULAR HEMOGLOBIN: 30.1 pg (ref 25.9–32.4)
MEAN CORPUSCULAR VOLUME: 88.4 fL (ref 77.6–95.7)
MEAN PLATELET VOLUME: 8.3 fL (ref 6.8–10.7)
MONOCYTES ABSOLUTE COUNT: 0.3 10*9/L (ref 0.3–0.8)
MONOCYTES RELATIVE PERCENT: 9.9 %
NEUTROPHILS ABSOLUTE COUNT: 2.1 10*9/L (ref 1.8–7.8)
NEUTROPHILS RELATIVE PERCENT: 74 %
PLATELET COUNT: 116 10*9/L — ABNORMAL LOW (ref 150–450)
RED BLOOD CELL COUNT: 2.71 10*12/L — ABNORMAL LOW (ref 3.95–5.13)
RED CELL DISTRIBUTION WIDTH: 16.8 % — ABNORMAL HIGH (ref 12.2–15.2)
WBC ADJUSTED: 2.8 10*9/L — ABNORMAL LOW (ref 3.6–11.2)

## 2023-03-10 LAB — PHOSPHORUS: PHOSPHORUS: 3.2 mg/dL (ref 2.4–5.1)

## 2023-03-10 LAB — HEPATIC FUNCTION PANEL
ALBUMIN: 2.8 g/dL — ABNORMAL LOW (ref 3.4–5.0)
ALKALINE PHOSPHATASE: 79 U/L (ref 46–116)
ALT (SGPT): 21 U/L (ref 10–49)
AST (SGOT): 21 U/L (ref <=34)
BILIRUBIN DIRECT: 0.5 mg/dL — ABNORMAL HIGH (ref 0.00–0.30)
BILIRUBIN TOTAL: 0.9 mg/dL (ref 0.3–1.2)
PROTEIN TOTAL: 5.2 g/dL — ABNORMAL LOW (ref 5.7–8.2)

## 2023-03-10 LAB — TACROLIMUS LEVEL, TROUGH: TACROLIMUS, TROUGH: 16.7 ng/mL — ABNORMAL HIGH (ref 5.0–15.0)

## 2023-03-10 LAB — BASIC METABOLIC PANEL
ANION GAP: 6 mmol/L (ref 5–14)
BLOOD UREA NITROGEN: 12 mg/dL (ref 9–23)
BUN / CREAT RATIO: 16
CALCIUM: 8.3 mg/dL — ABNORMAL LOW (ref 8.7–10.4)
CHLORIDE: 106 mmol/L (ref 98–107)
CO2: 25 mmol/L (ref 20.0–31.0)
CREATININE: 0.75 mg/dL
EGFR CKD-EPI (2021) FEMALE: >90 mL/min/{1.73_m2} (ref >=60)
GLUCOSE RANDOM: 95 mg/dL (ref 70–179)
POTASSIUM: 4 mmol/L (ref 3.4–4.8)
SODIUM: 137 mmol/L (ref 135–145)

## 2023-03-10 LAB — MAGNESIUM: MAGNESIUM: 1.6 mg/dL (ref 1.6–2.6)

## 2023-03-10 MED ORDER — LIDOCAINE 4 % TOPICAL PATCH
MEDICATED_PATCH | Freq: Every evening | TRANSDERMAL | 0 refills | 39 days | Status: CP
Start: 2023-03-10 — End: ?
  Filled 2023-03-10: qty 10, 10d supply, fill #0

## 2023-03-10 MED ORDER — METHOCARBAMOL 500 MG TABLET
ORAL_TABLET | Freq: Four times a day (QID) | ORAL | 1 refills | 30 days | Status: CP
Start: 2023-03-10 — End: ?
  Filled 2023-03-10: qty 120, 30d supply, fill #0

## 2023-03-10 MED ORDER — LETERMOVIR 480 MG TABLET
ORAL_TABLET | Freq: Every day | ORAL | 2 refills | 30 days | Status: CP
Start: 2023-03-10 — End: ?
  Filled 2023-03-10: qty 28, 28d supply, fill #0

## 2023-03-10 MED ORDER — TACROLIMUS 1 MG CAPSULE, IMMEDIATE-RELEASE
ORAL_CAPSULE | Freq: Two times a day (BID) | ORAL | 11 refills | 30.00000 days | Status: CP
Start: 2023-03-10 — End: ?

## 2023-03-10 MED ORDER — ENOXAPARIN 40 MG/0.4 ML SUBCUTANEOUS SYRINGE
Freq: Two times a day (BID) | SUBCUTANEOUS | 1 refills | 30 days | Status: CP
Start: 2023-03-10 — End: ?
  Filled 2023-03-10: qty 24, 30d supply, fill #0

## 2023-03-10 MED ADMIN — calcium carbonate (TUMS) chewable tablet 200 mg elem calcium: 200 mg | ORAL | @ 01:00:00

## 2023-03-10 MED ADMIN — predniSONE (DELTASONE) tablet 5 mg: 5 mg | ORAL | @ 12:00:00 | Stop: 2023-03-10 | NDC 55289085902

## 2023-03-10 MED ADMIN — aspirin chewable tablet 81 mg: 81 mg | ORAL | @ 12:00:00 | Stop: 2023-03-10 | NDC 96295013557

## 2023-03-10 MED ADMIN — methocarbamol (ROBAXIN) 1,000 mg in sodium chloride (NS) 0.9 % 50 mL IVPB: 1000 mg | INTRAVENOUS | @ 09:00:00 | Stop: 2023-03-10 | NDC 53191040901

## 2023-03-10 MED ADMIN — magnesium oxide-Mg AA chelate (Magnesium Plus Protein) 1 tablet: 1 | ORAL | @ 01:00:00 | NDC 75834005001

## 2023-03-10 MED ADMIN — valGANciclovir (VALCYTE) tablet 450 mg: 450 mg | ORAL | @ 12:00:00 | Stop: 2023-03-10 | NDC 69097027703

## 2023-03-10 MED ADMIN — calcium carbonate (TUMS) chewable tablet 200 mg elem calcium: 200 mg | ORAL | @ 12:00:00 | Stop: 2023-03-10

## 2023-03-10 MED ADMIN — acetaminophen (TYLENOL) tablet 650 mg: 650 mg | ORAL | @ 06:00:00 | Stop: 2023-03-10 | NDC 50580048790

## 2023-03-10 MED ADMIN — gabapentin (NEURONTIN) capsule 400 mg: 400 mg | ORAL | @ 12:00:00 | Stop: 2023-03-10 | NDC 76420023490

## 2023-03-10 MED ADMIN — gabapentin (NEURONTIN) capsule 400 mg: 400 mg | ORAL | @ 01:00:00 | NDC 76420023490

## 2023-03-10 MED ADMIN — enoxaparin (LOVENOX) syringe 40 mg: 40 mg | SUBCUTANEOUS | @ 12:00:00 | Stop: 2023-03-10 | NDC 81952012424

## 2023-03-10 MED ADMIN — calcium carbonate (TUMS) chewable tablet 200 mg elem calcium: 200 mg | ORAL | @ 18:00:00 | Stop: 2023-03-10

## 2023-03-10 MED ADMIN — pantoprazole (Protonix) EC tablet 40 mg: 40 mg | ORAL | @ 12:00:00 | Stop: 2023-03-10 | NDC 82009001190

## 2023-03-10 MED ADMIN — methocarbamol (ROBAXIN) tablet 500 mg: 500 mg | ORAL | @ 21:00:00 | Stop: 2023-03-10 | NDC 76439013450

## 2023-03-10 MED ADMIN — tacrolimus (PROGRAF) capsule 8 mg: 8 mg | ORAL | @ 12:00:00 | Stop: 2023-03-10 | NDC 00469301601

## 2023-03-10 MED ADMIN — tacrolimus (PROGRAF) capsule 8 mg: 8 mg | ORAL | @ 01:00:00 | NDC 00469301601

## 2023-03-10 MED ADMIN — magnesium sulfate 2gm/50mL IVPB: 2 g | INTRAVENOUS | @ 12:00:00 | Stop: 2023-03-10

## 2023-03-10 MED ADMIN — methocarbamol (ROBAXIN) tablet 500 mg: 500 mg | ORAL | @ 17:00:00 | Stop: 2023-03-10 | NDC 76439013450

## 2023-03-10 MED ADMIN — magnesium sulfate 2gm/50mL IVPB: 2 g | INTRAVENOUS | @ 15:00:00 | Stop: 2023-03-10

## 2023-03-10 MED ADMIN — gabapentin (NEURONTIN) capsule 400 mg: 400 mg | ORAL | @ 18:00:00 | Stop: 2023-03-10 | NDC 76420023490

## 2023-03-10 MED ADMIN — acetaminophen (TYLENOL) tablet 650 mg: 650 mg | ORAL | @ 01:00:00 | NDC 50580048790

## 2023-03-10 MED ADMIN — enoxaparin (LOVENOX) syringe 40 mg: 40 mg | SUBCUTANEOUS | @ 01:00:00 | NDC 81952012424

## 2023-03-10 MED ADMIN — magnesium oxide-Mg AA chelate (Magnesium Plus Protein) 1 tablet: 1 | ORAL | @ 12:00:00 | Stop: 2023-03-10 | NDC 75834005001

## 2023-03-10 NOTE — Unmapped (Signed)
Problem: Adult Inpatient Plan of Care  Goal: Plan of Care Review  Outcome: Progressing  Goal: Patient-Specific Goal (Individualized)  Outcome: Progressing  Goal: Absence of Hospital-Acquired Illness or Injury  Outcome: Progressing  Intervention: Identify and Manage Fall Risk  Recent Flowsheet Documentation  Taken 03/10/2023 1000 by Dian Situ, RN  Safety Interventions:   fall reduction program maintained   low bed  Taken 03/10/2023 0800 by Dian Situ, RN  Safety Interventions:   fall reduction program maintained   low bed  Intervention: Prevent Skin Injury  Recent Flowsheet Documentation  Taken 03/10/2023 1000 by Dian Situ, RN  Positioning for Skin: Sitting in Chair  Taken 03/10/2023 0800 by Dian Situ, RN  Positioning for Skin: Supine/Back  Goal: Optimal Comfort and Wellbeing  Outcome: Progressing  Goal: Readiness for Transition of Care  Outcome: Progressing  Goal: Rounds/Family Conference  Outcome: Progressing     Problem: Wound  Goal: Optimal Coping  Outcome: Progressing  Goal: Optimal Functional Ability  Outcome: Progressing  Intervention: Optimize Functional Ability  Recent Flowsheet Documentation  Taken 03/10/2023 1000 by Dian Situ, RN  Activity Management: up to bedside commode  Taken 03/10/2023 0800 by Dian Situ, RN  Activity Management: up to bedside commode  Goal: Absence of Infection Signs and Symptoms  Outcome: Progressing  Goal: Improved Oral Intake  Outcome: Progressing  Goal: Optimal Pain Control and Function  Outcome: Progressing  Goal: Skin Health and Integrity  Outcome: Progressing  Intervention: Optimize Skin Protection  Recent Flowsheet Documentation  Taken 03/10/2023 1000 by Dian Situ, RN  Activity Management: up to bedside commode  Taken 03/10/2023 0800 by Dian Situ, RN  Activity Management: up to bedside commode  Pressure Reduction Techniques: frequent weight shift encouraged  Pressure Reduction Devices: pressure-redistributing mattress utilized  Goal: Optimal Wound Healing  Outcome: Progressing     Problem: Fall Injury Risk  Goal: Absence of Fall and Fall-Related Injury  Outcome: Progressing  Intervention: Promote Scientist, clinical (histocompatibility and immunogenetics) Documentation  Taken 03/10/2023 1000 by Dian Situ, RN  Safety Interventions:   fall reduction program maintained   low bed  Taken 03/10/2023 0800 by Dian Situ, RN  Safety Interventions:   fall reduction program maintained   low bed

## 2023-03-10 NOTE — Unmapped (Signed)
Transplant Surgery Progress Note    Hospital Day: 2    Assessment:     Alicia Armstrong is a 47 y.o. 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 Celine Mans. She was readmitted for cytopenia.    Interval Events:     No acute events overnight. Pain is well controlled.  Vital signs are stable. Urine output is appropriate. Pain is mostly helped w/ Robaxin. CT spines negative, started on Lovenox 40 BID 7/7 and Releuko for her anemia    Plan:     Neuro:   - Pain well controlled   - Scheduled Robaxin   - PRN oxycodone 5 and 10    CV:   - HDS, Maintain SBP < 180   - Follow echo 7/8       Pulm:   - Stable on room air  Continue incentive spirometry, pulmonary toilet, out of bed as tolerated    GI:   - F: ML   - E: Replete as needed   - N: regular diet   - pantoprazole, miralax, colace    *Low back pain iso recent OLT:   - Liver US 7/7: New parvus tardus waveform, low RI, and blunted systolic peak velocity within the right and left hepatic arteries    - CT Lumbar and Thoracic spine 7/7: No acute fractures   - CXR 7/7: Normal   - Lovenox 30 BID for 6 weeks, start 7/7   - Scheduled Robaxin for pain   - Diuresis w/ 20 Lasix and 50 Spironolactone daily, start 7/8    GU:   - Voiding per self    Endo:   -glucose checks ACHS    -SSI    Heme/ID:   - Afebrile, WBC and Hgb stable   - ppx with valcyte   - Lovenox 40 BID for 6 weeks w/ aspirin 81 daily, then aspirin 325 daily    Immuno:   - tac, cellcept, prednisone  - pharmacy recommendations for dosing      Dispo   - Floor       Objective:        Vital Signs:  BP 148/84  - Pulse 76  - Temp 37 ??C (98.6 ??F) (Oral)  - Resp 17  - Ht 154.9 cm (5' 0.98)  - Wt 72.3 kg (159 lb 6.3 oz)  - SpO2 100%  - BMI 30.13 kg/m??     Input/Output:  I/O last 3 completed shifts:  In: 650 [Blood:650]  Out: - NOT DOCUMENTED   2X urine, 0 BM  Physical Exam:    General: Cooperative, no distress, well appearing female  Pulmonary: Normal work of breathing, on room air  Cardiovascular: RRR  Abdomen: Soft, non-tender, non-distended. Surgical incisions without signs of infection. Well healing.   Musculoskeletal: Moves extremities spontaneously   Neurologic: Alert and interactive, grossly intact    Labs:  Lab Results   Component Value Date    WBC 2.5 (L) 03/09/2023    HGB 8.7 (L) 03/09/2023    HCT 25.4 (L) 03/09/2023    PLT 113 (L) 03/09/2023       Lab Results   Component Value Date    NA 139 03/09/2023    K 4.4 03/09/2023    CL 107 03/09/2023    CO2 27.0 03/09/2023    BUN 11 03/09/2023    CREATININE 0.72 03/09/2023    CALCIUM 8.3 (L) 03/09/2023    MG 1.7 03/09/2023  PHOS 3.6 03/09/2023       Microbiology Results (last day)       ** No results found for the last 24 hours. **            Imaging:  All pertinent imaging personally reviewed.    Alicia Armstrong , MD  General Surgery PGY1

## 2023-03-10 NOTE — Unmapped (Signed)
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 Dose decreased back to home 6 mg BID in setting of letermovir initiation per discussion with outpatient transplant CPP. Levels running high throughout admission but are notable reflective of ~7-8 hour levels rather than true 12h troughs.      Mycophenolate mofetil 250 mg BID discontinued on discharge in setting of persistent cytopenia     Prednisone 5 mg daily continued without changes     Antimicrobials during admission:   CMV:  D-/R+   Goal/Discharge dose:  Valcyte 450 mg daily > Valcyte discontinued and letermovir (Prevymis) 480 mg daily started on discharge in setting of persistent cytopenias.   End date: 05/18/23  PJP:    Pentamidine x1 on 6/29 (dc'd Bactrim due to leukopenia), could resume Bactrim outpatient if needed; otherwise, will continue pentamidine 300 mg inhalation q28 days.  End date: 08/17/23     Medication changes to be instituted upon discharge:  IS: Cellcept discontinued due to cytopenia  CMV Ppx: Valcyte 450 mg daily discontinued and letermovir (Prevymis) 480 mg daily started  Heme: Lovenox 30 mg q12hr increased to Lovenox 40 mg q12hr  Pain: Lidocaine patch started and methocarbamol increased from 500 mg TID PRN to 500 mg QID  Endocrine: continued home insulin prescriptions without changes, however patient reports no longer taking insulin. Advised patient to continue managing her BG as she has been previously instructed to do outpatient. Patient requested insulin be left on medication card upon discharge and will follow-up with transplant coordinator to confirm recent insulin discontinuation as discussed in clinic     Educated patient to apply lidocaine patch to only clean, dry skin and to avoid placing over open skin/wounds or incision. Discussed importance of stopping Valcyte and Cellcept - reiterated letermovir (Prevymis) would be replacing Valcyte for viral ppx. Education was provided with [place] interpreter.     Suggested monitoring for outpatient follow-up:   Heme: Lovenox increased to 40 mg q12hrs beginning 7/16 PM dose - last anti-Xa 0.09 on 03/09/23. Please follow-up with anti-Xa levels outpatient and monitor as needed (weekly to start) to ensure in therapeutic range.  Lovenox initiated on 03/01/23 during prior admission based on liver US (7/7: New parvus tardus waveform, low RI, and blunted systolic peak velocity within the right and left hepatic arteries). Plan for prophylaxis enoxaparin for six weeks along with aspirin 81 mg daily. Goal anti-Xa of 0.2-0.4.   IS: Continue to monitor tac levels and adjust the dose as needed in setting of letermovir initiation on 03/11/23; restart MMF as able (will need new rx as medication discontinued in Epic upon discharge)  CMV Ppx: Valcyte discontinued and letermovir started upon discharge. Please confirm patient has stopped taking Valcyte - this was discussed with patient as stated  above, though may benefit from reinforcement of medication change.   PJP Ppx: Next pentamidine dose due ~03/20/23 - please confirm scheduling of next dose in clinic. Therapy plan entered by CM prior to discharge.   Endocrine: Patient preference to confirm insulin discontinuation with transplant coordinator. Please confirm plan made in clinic prior to admission with patient - patient confirmed she will cross out insulin from medication card once discussed with transplant coordinator.     Rance Muir, PharmD   Clinical Pharmacist    Future Appointments   Date Time Provider Department Center   03/22/2023  9:00 AM [facility] ONLY [facility]   03/22/2023  9:30 AM TRANSPL[facility] SURGERY PHARMACY [facility]   03/22/2023 12:00 PM Florene Glen, MD [facility]   03/22/2023  2:00 PM [facility] NURSE [facility]   03/26/2023 10:00 AM Paulla Fore, CNA [facility]   04/02/2023 10:00 AM Bosie Clos, Jillene Bucks, PsyD [facility]   04/22/2023  2:00 PM [facility] NURSE [facility]   05/17/2023 11:30 AM TRANSPL[facility] SURGERY PHARMACY [facility]   05/17/2023 12:45 PM Florene Glen, MD [facility]   05/20/2023 12:00 PM Eliezer Bottom, LCSW [facility]   05/20/2023  2:00 PM [facility] NURSE [facility]   06/17/2023  2:00 PM [facility] NURSE [facility]   07/15/2023  2:00 PM [facility] NURSE [facility]   08/12/2023  2:00 PM [facility] NURSE [facility]

## 2023-03-10 NOTE — Unmapped (Addendum)
Informaci??n adicional sobre la cita telef??nica el 2 de [place]gett de 2024 a las 10:00 a. m:  Esta es una consulta telef??nica. Su coordinadora asistencial le llamar?? a la hora de la cita. NO debe ir a la oficina para esta cita.

## 2023-03-10 NOTE — Unmapped (Signed)
Problem: Adult Inpatient Plan of Care  Goal: Plan of Care Review  Outcome: Progressing  Goal: Patient-Specific Goal (Individualized)  Outcome: Progressing  Goal: Absence of Hospital-Acquired Illness or Injury  Outcome: Progressing  Intervention: Identify and Manage Fall Risk  Recent Flowsheet Documentation  Taken 03/09/2023 2200 by Crista Elliot, RN  Safety Interventions:   environmental modification   fall reduction program maintained   nonskid shoes/slippers when out of bed   lighting adjusted for tasks/safety   low bed  Intervention: Prevent Skin Injury  Recent Flowsheet Documentation  Taken 03/10/2023 0200 by Crista Elliot, RN  Positioning for Skin: Sitting in Chair  Taken 03/10/2023 0000 by Crista Elliot, RN  Positioning for Skin: Supine/Back  Taken 03/09/2023 2200 by Crista Elliot, RN  Positioning for Skin: Right  Taken 03/09/2023 2110 by Crista Elliot, RN  Positioning for Skin: Supine/Back  Device Skin Pressure Protection:   absorbent pad utilized/changed   adhesive use limited  Skin Protection:   adhesive use limited   incontinence pads utilized  Intervention: Prevent and Manage VTE (Venous Thromboembolism) Risk  Recent Flowsheet Documentation  Taken 03/09/2023 2200 by Crista Elliot, RN  Anti-Embolism Device Type: SCD, Knee  Anti-Embolism Intervention: On  Anti-Embolism Device Location: BLE  Goal: Optimal Comfort and Wellbeing  Outcome: Progressing  Goal: Readiness for Transition of Care  Outcome: Progressing  Goal: Rounds/Family Conference  Outcome: Progressing     Problem: Wound  Goal: Optimal Coping  Outcome: Progressing  Goal: Optimal Functional Ability  Outcome: Progressing  Intervention: Optimize Functional Ability  Recent Flowsheet Documentation  Taken 03/09/2023 2200 by Crista Elliot, RN  Activity Management: up ad lib  Goal: Absence of Infection Signs and Symptoms  Outcome: Progressing  Goal: Improved Oral Intake  Outcome: Progressing  Goal: Optimal Pain Control and Function  Outcome: Progressing  Intervention: Prevent or Manage Pain  Recent Flowsheet Documentation  Taken 03/09/2023 2200 by Crista Elliot, RN  Sleep/Rest Enhancement: regular sleep/rest pattern promoted  Goal: Skin Health and Integrity  Outcome: Progressing  Intervention: Optimize Skin Protection  Recent Flowsheet Documentation  Taken 03/09/2023 2200 by Crista Elliot, RN  Activity Management: up ad lib  Taken 03/09/2023 2110 by Crista Elliot, RN  Pressure Reduction Techniques: frequent weight shift encouraged  Pressure Reduction Devices: pressure-redistributing mattress utilized  Skin Protection:   adhesive use limited   incontinence pads utilized  Goal: Optimal Wound Healing  Outcome: Progressing  Intervention: Promote Wound Healing  Recent Flowsheet Documentation  Taken 03/09/2023 2200 by Crista Elliot, RN  Sleep/Rest Enhancement: regular sleep/rest pattern promoted     Problem: Fall Injury Risk  Goal: Absence of Fall and Fall-Related Injury  Outcome: Progressing  Intervention: Promote Scientist, clinical (histocompatibility and immunogenetics) Documentation  Taken 03/09/2023 2200 by Crista Elliot, RN  Safety Interventions:   environmental modification   fall reduction program maintained   nonskid shoes/slippers when out of bed   lighting adjusted for tasks/safety   low bed    Alert and oriented, VSS, RA, still complaining of mid back pain, MD notified thru secure chat with orders. Hourly rounds performed and safety precautions maintained. No acute events overnight. Will continue to monitor.

## 2023-03-10 NOTE — Unmapped (Addendum)
Alicia Armstrong is a 47 y.o. 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 Patient's hospital course is described below.    On admission, patient's Hgb was 6.9 and WBC was 1.0. Cellcept was held. Patient's hemoglobin uptrended to 8.7 after two units of pRBC and remained stable during remaining hospital stay. Her WBC uptrended to 2.5 on hospital day 2, post 1 dose of filgastrim. 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 Her levels will be followed up outpatient.    Patient otherwise remained stable during this hospital stay. Cellcept held at discharge.    IS: Tac 8 mg BID and prednisone 5 mg daily  Ppx: Valcyte and aspirin

## 2023-03-11 DIAGNOSIS — Z944 Liver transplant status: Principal | ICD-10-CM

## 2023-03-11 DIAGNOSIS — D702 Other drug-induced agranulocytosis: Principal | ICD-10-CM

## 2023-03-11 NOTE — Unmapped (Signed)
Discharge Summary    Admit date: 03/08/2023    Discharge date and time: 03/10/23    Discharge to:  Home    Discharge Service: Surg Transplant [facility])    Discharge Attending Physician: Edrick Kins, MD    Discharge  Diagnoses: Cytopenia iso Cellcept     Secondary Diagnosis: Active Problems:    * No active hospital problems. *  Resolved Problems:    * No resolved hospital problems. *      OR Procedures:  None     Ancillary Procedures: no procedures    Discharge Day Services:     Subjective   No acute events overnight. Pain Controlled. No fever or chills. WBC and Hgb stable.    Objective   Patient Vitals for the past 8 hrs:   BP Temp Temp src Pulse Resp SpO2   03/10/23 1141 138/80 36.7 ??C (98.1 ??F) Oral 75 17 100 %     No intake/output data recorded.    General Appearance:   No acute distress  Lungs:                Clear to auscultation bilaterally  Heart:                           Regular rate and rhythm  Abdomen:                Soft, non-tender, non-distended  Extremities:              Warm and well perfused    Hospital Course:  Alicia Armstrong is a 47 y.o. 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 Patient's hospital course is described below.    On admission, patient's Hgb was 6.9 and WBC was 1.0. Cellcept was held. Patient's hemoglobin uptrended to 8.7 after two units of pRBC and remained stable during remaining hospital stay. Her WBC uptrended to 2.5 on hospital day 2, post 1 dose of filgastrim. 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 Her levels will be followed up outpatient.    Patient otherwise remained stable during this hospital stay. Cellcept held at discharge.    IS: Tac 8 mg BID and prednisone 5 mg daily  Ppx: Valcyte and aspirin    Condition at Discharge: Improved  Discharge Medications:      Medication List START taking these medications     LIDOCAINE PAIN RELIEF 4 % patch; Generic drug: lidocaine; Place 1 patch   on skin around the incision for 12 hours nightly, then remove for 12   hours. Do not place on open skin or the incision directly.; Notes to   patient: Coloque 1 parche en la piel alrededor de la incisi??n durante 12   horas cada noche y luego ret??relo durante 12 horas. No lo coloque   directamente sobre una herida o la incisi??n.   PREVYMIS 480 mg tablet; Generic drug: letermovir; Tome 1 p??ldora por v??a   oral una vez al d??a. (Dosis = 480 mg); (Take 1 tablet (480 mg total) by   mouth daily.)   tacrolimus 1 MG capsule; Commonly known as: PROGRAF; Take 8 capsules (8   mg total) by mouth two (2) times a day.; Notes to patient: Tome 8 c??psulas   (8 mg total) por v??a oral dos (2) veces  al d??a.     CHANGE how you take these medications     enoxaparin 40 mg/0.4 mL Syrg; Commonly known as: LOVENOX; Inyecte el   medicamento bajo la piel. Use el medicamento cada 12 horas. Use 0.4 ml   cada vez. (Dosis = 40 mg); (Inject 0.4 mL (40 mg total) under the skin   every twelve (12) hours.); What changed: medication strength, how much to   take, when to take this   methocarbamol 500 MG tablet; Commonly known as: ROBAXIN; Tome 1 p??ldora   por v??a oral cuatro veces al d??a. (Dosis = 500 mg); (Take 1 tablet (500 mg   total) by mouth four (4) times a day.); What changed: when to take this,   reasons to take this     CONTINUE taking these medications     * ACCU-CHEK GUIDE GLUCOSE METER Misc; Generic drug: blood-glucose meter;   Use seg??n las instrucciones; (Use as instructed)   * blood-glucose meter Misc; Use seg??n lo indicado; (Use as directed)   ACCU-CHEK GUIDE TEST STRIPS Strp; Generic drug: blood sugar diagnostic;   Use to check blood sugar as directed with insulin 3 times a day & for   symptoms of high or low blood sugar.; Notes to patient: Use para revisar   el nivel de az??car en la sangre tal como se indica, con la insulina 3 veces al d??a, y si tiene s??ntomas de nivel alto o bajo de az??car en la   sangre.   ACCU-CHEK SOFTCLIX LANCETS lancets; Generic drug: lancets; Use to check   blood sugar as directed with insulin 3 times a day & for symptoms of high   or low blood sugar.; Notes to patient: Use para revisar el nivel de az??car   en la sangre tal como se indica, con la insulina 3 veces al d??a, y si   tiene s??ntomas de nivel alto o bajo de az??car en la sangre.   acetaminophen 325 MG tablet; Commonly known as: Tylenol; Take 1-2   tablets (325-650 mg total) by mouth every eight (8) hours as needed for   pain or fever.; Notes to patient: Tome 1 a 2 tabletas (325 a 650 mg total)   por v??a oral cada ocho (8) horas, seg??n sea necesario para el dolor o la   fiebre.   aspirin 81 MG tablet; Commonly known as: ECOTRIN; Tome 1 p??ldora por v??a   oral una vez al d??a. (Dosis = 81 mg); (Take 1 tablet (81 mg total) by   mouth daily.)   calcium carbonate 200 mg calcium (500 mg) chewable tablet; Commonly   known as: TUMS; Chew 1 tablet (500 mg total) Three (3) times a day.; Notes   to patient: Mastique 1 tableta (500 mg total) tres (3) veces al d??a.   docusate sodium 100 MG capsule; Commonly known as: COLACE; Use seg??n sea   necesario para el estre??imiento. Tome 1 p??ldora por v??a oral dos veces al   d??a. (Dosis = 100 mg); (Take 1 capsule (100 mg total) by mouth two (2)   times a day as needed for constipation.)   gabapentin 400 MG capsule; Commonly known as: NEURONTIN; Tome 1 p??ldora   por v??a oral tres veces al d??a. (Dosis = 400 mg); (Take 1 capsule (400 mg   total) by mouth Three (3) times a day.)   HEALTHYLAX 17 gram packet; Generic drug: polyethylene glycol; Mix 1   packet in  4 to 8 ounces of liquid and drink  daily as needed.; Notes to   patient: Mezcle el contenido de 1 paquete en 4 a 8 onzas de l??quido y beba   a diario, seg??n sea necesario.   insulin lispro 100 unit/mL injection pen; Commonly known as: HumaLOG;   Notes to patient: Inyecte 1 unidad por v??a subcut??nea tres (3) veces al   d??a antes de las comidas.   magnesium (amino acid chelate) 133 mg tablet; Generic drug: magnesium   oxide-Mg AA chelate; Tome 1 p??ldora por v??a oral dos veces al d??a.; (Take   1 tablet by mouth two (2) times a day.)   pantoprazole 40 MG tablet; Commonly known as: Protonix; Tome 1 p??ldora   por v??a oral una vez al d??a. (Dosis = 40 mg); (Take 1 tablet (40 mg total)   by mouth daily.)   pentamidine in sterile water; Inhale 6 mL (300 mg total) every   twenty-eight (28) days.; Start taking on: March 20, 2023; Notes to patient:   Inhale 6 ml (300 mg total) cada veintiocho (28) d??as.   predniSONE 5 MG tablet; Commonly known as: DELTASONE; Tome 1 p??ldora por   v??a oral una vez al d??a. (Dosis = 5 mg); (Take 1 tablet (5 mg total) by   mouth daily.)   SITagliptin phosphate 100 MG tablet; Commonly known as: JANUVIA; Tome 1   p??ldora por v??a oral una vez al d??a. (Dosis = 100 mg); (Take 1 tablet (100   mg total) by mouth daily.)   UNIFINE PENTIPS 32 gauge x 5/32 (4 mm) Ndle; Generic drug: pen needle,   diabetic; Use with insulin up to 4 times/day as needed.; Notes to patient:   Use para la insulina hasta 4 veces al d??a, seg??n sea necesario.  * This list has 2 medication(s) that are the same as other medications   prescribed for you. Read the directions carefully, and ask your doctor or   other care provider to review them with you.     STOP taking these medications     mycophenolate 250 mg capsule; Commonly known as: CELLCEPT   valGANciclovir 450 mg tablet; Commonly known as: VALCYTE       Pending Test Results:     Discharge Instructions:    Future Appointments:  Appointments which have been scheduled for you      Mar 22, 2023 9:00 AM  (Arrive by 8:30 AM)  LAB ONLY with SURTRA LAB ONLY  [facility] TRANSPLANT SURGERY [facility] REGION) [address]  [place] [address]  [phone]        Mar 22, 2023 9:30 AM  (Arrive by 9:00 AM)  RETURN PHARMD with TRANSPLANT SURGERY PHARMACY  [facility] TRANSPLANT SURGERY [place] [facility] REGION) [address]  [place] [address]  [phone]        Mar 22, 2023 12:00 PM  (Arrive by 11:30 AM)  RETURN 15 with Chirag Lanney Gins, MD  [facility] TRANSPLANT SURGERY [facility] REGION) [address]  [place] [address]  [phone]        Mar 22, 2023 2:00 PM  (Arrive by 1:30 PM)  NURSE  30 with [facility]  [facility] TRANSPLANT SURGERY [facility] REGION) [address]  [place] [address]  [phone]        Mar 26, 2023 10:00 AM  CARE MANAGEMENT PHONE BRIEF with Paulla Fore, CNA  [facility] POPULATION HEALTH SERVICES [place] [address]  [address]  [place] [address]  [phone]   This is a telephone appointment. Your Case Manager will call you at this time. Please DO NOT come to the office for this appointment.         Apr 02, 2023 10:00 AM  (Arrive by 9:30 AM)  NEW VIDEO HCP MYCHART with Suzan Nailer, PsyD  [facility] TRANSPLANT SURGERY [facility] REGION) [address]  [place] [address]  [phone]   Inicie sesi??n en su cuenta de My [facility] Chart al menos 15 minutos antes de la cita para completar el proceso de registro electr??nico ??eCheck-In??Marland Kitchen Antes de iniciar la visita por video debe registrarse electr??nicamente. Tambi??n recomendamos que compruebe su conexi??n de audio y video para poder solucionar cualquier problema antes de que comience la visita. Haga clic en ????nase a visita por video?? para Geologist, engineering. Una vez que se haya registrado electr??nicamente y comprobado su audio y video, haga clic en ????nase a la llamada?? para conectarse a la visita.       Para la visita por video, usted necesita una computadora con c??mara, altavoz y micr??fono que funcionen bien o un tel??fono celular inteligente o una tableta electr??nica con acceso a internet. My [place] Chart le permite organizar su salud, enviar mensajes no urgentes a su proveedor m??dico, ver los resultados de sus pruebas, Charity fundraiser y Chief Strategy Officer citas y reponer sus recetas de Craig [employer] segura y conveniente desde su computadora o dispositivo m??vil.      Visite [email or web address] para iniciar una sesi??n de My [place] Chart con su nombre de usuario y contrase??a.  Si olvida su nombre de usuario o contrase??a, seleccione los enlaces ????Olvid?? su nombre de usuario??? u ????Olvid?? su contrase??a??? para acceder a su cuenta. Tambi??n puede acceder a su cuenta de My [place] Chart utilizando la aplicaci??n m??vil gratuita MyChart para Tour manager.      Si necesita ayuda para acceder a su cuenta de My [place] Chart o para comunicarse con el consultorio de su proveedor m??dico para programar de nuevo o cancelar su cita, llame a [facility] al [phone].        Apr 22, 2023 2:00 PM  (Arrive by 1:30 PM)  NURSE  30 with [facility]  [facility] TRANSPLANT SURGERY [facility] REGION) [address]  [place] [address]  [phone]        May 17, 2023 11:30 AM  (Arrive by 11:00 AM)  RETURN PHARMD with TRANSPLANT SURGERY PHARMACY  [facility] TRANSPLANT SURGERY [facility] REGION) [address]  [place] [address]  [phone]        May 17, 2023 12:45 PM  (Arrive by 12:15 PM)  RETURN 15 with Chirag Lanney Gins, MD  [facility] TRANSPLANT SURGERY [facility] REGION) [address]  [place] [address]  [phone]        May 20, 2023 12:00 PM  (Arrive by 11:30 AM)  RETURN VIDEO HCP DIRECT LINK with Eliezer Bottom, LCSW  [facility] [place])  Arrive at: This is a Video Visit [address]  [place] [address]  [phone]   Su proveedor m??dico le enviar?? un enlace directo en el momento de su cita por video. NO vaya a la cl??nica.       Para la visita por video, usted [employer]  computadora con c??mara, altavoz y micr??fono que funcionen bien o un tel??fono celular o una tableta electr??nica con acceso a internet.        May 20, 2023 2:00 PM  (Arrive by 1:30 PM)  NURSE  30 with [facility]  [facility] TRANSPLANT SURGERY [place] [facility] REGION) [address]  [place] [address]  [phone]        Jun 17, 2023 2:00 PM  (Arrive by 1:30 PM)  NURSE  30 with [facility]  [facility] TRANSPLANT SURGERY [place] [facility] REGION) [address]  [place] [address]  [phone]        Jul 15, 2023 2:00 PM  (Arrive by 1:30 PM)  NURSE  30 with [facility]  [facility] TRANSPLANT SURGERY [place] [facility] REGION) [address]  [place] [address]  [phone]        Aug 12, 2023 2:00 PM  (Arrive by 1:30 PM)  NURSE  30 with [facility]  [facility] TRANSPLANT SURGERY [facility] REGION) [address]  [place] [address]  [phone]      Additional instructions:    Informaci??n adicional sobre la cita telef??nica el 2 de Miner de 2024 a las 10:00 a. m:  Esta es una consulta telef??nica. Su coordinadora asistencial le llamar?? a la hora de la cita. NO debe ir a la oficina para esta cita.

## 2023-03-12 NOTE — Unmapped (Signed)
Pt called from [facility] this morning to ask if standing order could be faxed to her lab as [facility] was unable to access orders due to global outage. Faxed order to [phone] and pt confirmed receipt. She was thankful for assistance.

## 2023-03-13 LAB — COMPREHENSIVE METABOLIC PANEL
ALBUMIN: 3.8 g/dL — ABNORMAL LOW (ref 3.9–4.9)
ALKALINE PHOSPHATASE: 104 IU/L (ref 44–121)
ALT (SGPT): 24 IU/L (ref 0–32)
AST (SGOT): 21 IU/L (ref 0–40)
BILIRUBIN TOTAL (MG/DL) IN SER/PLAS: 0.8 mg/dL (ref 0.0–1.2)
BLOOD UREA NITROGEN: 12 mg/dL (ref 6–24)
BUN / CREAT RATIO: 17 (ref 9–23)
CALCIUM: 8.7 mg/dL (ref 8.7–10.2)
CHLORIDE: 102 mmol/L (ref 96–106)
CO2: 26 mmol/L (ref 20–29)
CREATININE: 0.71 mg/dL (ref 0.57–1.00)
EGFR: 106 mL/min/{1.73_m2}
GLOBULIN, TOTAL: 2.1 g/dL (ref 1.5–4.5)
GLUCOSE: 102 mg/dL — ABNORMAL HIGH (ref 70–99)
POTASSIUM: 4.5 mmol/L (ref 3.5–5.2)
SODIUM: 140 mmol/L (ref 134–144)
TOTAL PROTEIN: 5.9 g/dL — ABNORMAL LOW (ref 6.0–8.5)

## 2023-03-13 LAB — MAGNESIUM: MAGNESIUM: 2 mg/dL (ref 1.6–2.3)

## 2023-03-13 LAB — CBC W/ DIFFERENTIAL
BANDED NEUTROPHILS ABSOLUTE COUNT: 0 10*3/uL (ref 0.0–0.1)
BASOPHILS ABSOLUTE COUNT: 0 10*3/uL (ref 0.0–0.2)
BASOPHILS RELATIVE PERCENT: 1 %
EOSINOPHILS ABSOLUTE COUNT: 0.1 10*3/uL (ref 0.0–0.4)
EOSINOPHILS RELATIVE PERCENT: 4 %
HEMATOCRIT: 29.1 % — ABNORMAL LOW (ref 34.0–46.6)
HEMOGLOBIN: 9.2 g/dL — ABNORMAL LOW (ref 11.1–15.9)
IMMATURE GRANULOCYTES: 1 %
LYMPHOCYTES ABSOLUTE COUNT: 0.4 10*3/uL — ABNORMAL LOW (ref 0.7–3.1)
LYMPHOCYTES RELATIVE PERCENT: 9 %
MEAN CORPUSCULAR HEMOGLOBIN CONC: 31.6 g/dL (ref 31.5–35.7)
MEAN CORPUSCULAR HEMOGLOBIN: 28.9 pg (ref 26.6–33.0)
MEAN CORPUSCULAR VOLUME: 92 fL (ref 79–97)
MONOCYTES ABSOLUTE COUNT: 0.3 10*3/uL (ref 0.1–0.9)
MONOCYTES RELATIVE PERCENT: 6 %
NEUTROPHILS ABSOLUTE COUNT: 3.2 10*3/uL (ref 1.4–7.0)
NEUTROPHILS RELATIVE PERCENT: 79 %
PLATELET COUNT: 153 10*3/uL (ref 150–450)
RED BLOOD CELL COUNT: 3.18 x10E6/uL — ABNORMAL LOW (ref 3.77–5.28)
RED CELL DISTRIBUTION WIDTH: 15.3 % (ref 11.7–15.4)
WHITE BLOOD CELL COUNT: 4.1 10*3/uL (ref 3.4–10.8)

## 2023-03-13 LAB — GAMMA GT: GAMMA GLUTAMYL TRANSFERASE: 59 IU/L (ref 0–60)

## 2023-03-13 LAB — BILIRUBIN, DIRECT: BILIRUBIN DIRECT: 0.33 mg/dL (ref 0.00–0.40)

## 2023-03-13 LAB — PHOSPHORUS: PHOSPHORUS, SERUM: 3.3 mg/dL (ref 3.0–4.3)

## 2023-03-13 NOTE — Unmapped (Signed)
Patient reached out to [facility] yesterday, without an interpreter and communicated clearly in English,requesting clarification on her diabetes meds, stating she was instructed to restart her humalog and januvia at hospital discharge, but instructed by pharmacy at her last clinic appt to stop the humalog. Discussed with PharmD Chargualaf, who said she does not need the humalog, since she did not need it while inpatient, but she could continue to take the [place]. She did not recommend regular daily glucose testing either. On discussion of her LMWH testing, she recommended patient be retested on 7/29 at her clinic visit.    Spoke with patient and relayed all communications. She repeated back recommendations and verbalized understanding. Also discussed that she should administer her lovenox at 8am on 7/29 for LMWH Anti-xa testing that afternoon, but she will also need to have labs drawn at 9am, as well, while holding her tacrolimus. She verbalized understanding. Let her know that her lab results from yesterday were stable, but tac was still pending.

## 2023-03-14 LAB — TACROLIMUS LEVEL: TACROLIMUS BLOOD: 13.2 ng/mL (ref 2.0–20.0)

## 2023-03-15 ENCOUNTER — Ambulatory Visit: Admit: 2023-03-15 | Discharge: 2023-03-16 | Payer: PRIVATE HEALTH INSURANCE

## 2023-03-15 DIAGNOSIS — Z944 Liver transplant status: Principal | ICD-10-CM

## 2023-03-15 NOTE — Unmapped (Signed)
Patient's 7/19 Tac level still elevated above goal at 13.2. Discussed with PharmD Chargualaf, who felt it was r/t starting the letermovir and recommended reducing her dose to 7mg /6mg  daily. Spoke to patient, who said she has been taking 6mg  bid instead of 8mg  bid, ordered at discharge. Discussed with PharmD Chargualaf, who recommended she continue at that dosage for now, but to refer to her 7/17 med sheet, printed at her most recent hospital discharge and continue on the 6bid dose with plan for further adjustments if needed after today's Tac has resulted.    To assure correct understanding of patient, engaged assistance of Brianberg, Cammy Copa [phone]), and returned call to patient, and confirmed that she has been taking the 6mg  Tac bid. She also confirmed that she has been taking letermovir since hospital discharge and discontinued the valcyte. Encouraged her to continue with present dosages and to refer to the most current med sheet, but to adjust the Tac dose on it now to 6mg  bid. Let her know that once today's labs are resulted if a change in IS is warranted, then this TNC would let her know. She verbalized understanding.

## 2023-03-15 NOTE — Unmapped (Signed)
COMMUNITY HEALTH WORKER  Brief Contact Note    03/15/2023  Inform/Regulatory Low priority, no response needed unless change in care.     I am reaching out to Oyindamola Renelda Mom as part of the community health worker team regarding her Resource Coordination.    Patient advised to  [employer] for transportation  .      Next appt with PCP: n/a     See chart for med list if needed    Sharing communication as part of regulatory requirements.     Assessment:    CHW contacted Patient for follow up regarding Transportation barriers  Patient called regarding issues calling medical health insurance for transportation. Web designer gave transportation contact information for [employer] /medicaid  [employer] at [phone]. [employer] Worker called number and assisted patient with navigating through the call . Patient was advised to reach out if anything is needed.     Food Insecurity: No Food Insecurity (02/23/2023)    Hunger Vital Sign     Worried About Running Out of Food in the Last Year: Never true     Ran Out of Food in the Last Year: Never true     Transportation Needs: No Transportation Needs (02/23/2023)    PRAPARE - Therapist, art (Medical): No     Lack of Transportation (Non-Medical): No     Financial Resource Strain: Low Risk  (02/23/2023)    Overall Financial Resource Strain (CARDIA)     Difficulty of Paying Living Expenses: Not very hard     Housing/Utilities: Low Risk  (02/23/2023)    Housing/Utilities     Within the past 12 months, have you ever stayed: outside, in a car, in a tent, in an overnight shelter, or temporarily in someone else's home (i.e. couch-surfing)?: No     Are you worried about losing your housing?: No     Within the past 12 months, have you been unable to get utilities (heat, electricity) when it was really needed?: No     Health Literacy: Medium Risk (02/23/2023)    Health Literacy     : Sometimes      Social Connections: Socially Integrated (02/23/2023) Social Connection and Isolation Panel [NHANES]     Frequency of Communication with Friends and Family: More than three times a week     Frequency of Social Gatherings with Friends and Family: More than three times a week     Attends Religious Services: More than 4 times per year     Active Member of [employer] or Organizations: Yes     Attends Engineer, structural: More than 4 times per year     Marital Status: Married        I provided an intervention for the Transportation Needs SDOH domain. The intervention was Provided community resources and Supportive listening     Patient expressed understanding.    Next Follow-up: No additional services needed    Note Routed: No.  Routed to:N/A    Signed: Paulla Fore, CNA

## 2023-03-16 DIAGNOSIS — Z944 Liver transplant status: Principal | ICD-10-CM

## 2023-03-16 LAB — CBC W/ DIFFERENTIAL
BANDED NEUTROPHILS ABSOLUTE COUNT: 0.1 10*3/uL (ref 0.0–0.1)
BASOPHILS ABSOLUTE COUNT: 0 10*3/uL (ref 0.0–0.2)
BASOPHILS RELATIVE PERCENT: 0 %
EOSINOPHILS ABSOLUTE COUNT: 0.1 10*3/uL (ref 0.0–0.4)
EOSINOPHILS RELATIVE PERCENT: 2 %
HEMATOCRIT: 29.6 % — ABNORMAL LOW (ref 34.0–46.6)
HEMOGLOBIN: 9.2 g/dL — ABNORMAL LOW (ref 11.1–15.9)
IMMATURE GRANULOCYTES: 4 %
LYMPHOCYTES ABSOLUTE COUNT: 0.3 10*3/uL — ABNORMAL LOW (ref 0.7–3.1)
LYMPHOCYTES RELATIVE PERCENT: 10 %
MEAN CORPUSCULAR HEMOGLOBIN CONC: 31.1 g/dL — ABNORMAL LOW (ref 31.5–35.7)
MEAN CORPUSCULAR HEMOGLOBIN: 28.1 pg (ref 26.6–33.0)
MEAN CORPUSCULAR VOLUME: 91 fL (ref 79–97)
MONOCYTES ABSOLUTE COUNT: 0.3 10*3/uL (ref 0.1–0.9)
MONOCYTES RELATIVE PERCENT: 10 %
NEUTROPHILS ABSOLUTE COUNT: 2 10*3/uL (ref 1.4–7.0)
NEUTROPHILS RELATIVE PERCENT: 74 %
PLATELET COUNT: 141 10*3/uL — ABNORMAL LOW (ref 150–450)
RED BLOOD CELL COUNT: 3.27 x10E6/uL — ABNORMAL LOW (ref 3.77–5.28)
RED CELL DISTRIBUTION WIDTH: 15.2 % (ref 11.7–15.4)
WHITE BLOOD CELL COUNT: 2.7 10*3/uL — ABNORMAL LOW (ref 3.4–10.8)

## 2023-03-16 LAB — COMPREHENSIVE METABOLIC PANEL
ALBUMIN: 3.8 g/dL — ABNORMAL LOW (ref 3.9–4.9)
ALKALINE PHOSPHATASE: 105 IU/L (ref 44–121)
ALT (SGPT): 20 IU/L (ref 0–32)
AST (SGOT): 22 IU/L (ref 0–40)
BILIRUBIN TOTAL (MG/DL) IN SER/PLAS: 0.6 mg/dL (ref 0.0–1.2)
BLOOD UREA NITROGEN: 11 mg/dL (ref 6–24)
BUN / CREAT RATIO: 15 (ref 9–23)
CALCIUM: 8.7 mg/dL (ref 8.7–10.2)
CHLORIDE: 104 mmol/L (ref 96–106)
CO2: 20 mmol/L (ref 20–29)
CREATININE: 0.71 mg/dL (ref 0.57–1.00)
EGFR: 106 mL/min/{1.73_m2}
GLOBULIN, TOTAL: 2.2 g/dL (ref 1.5–4.5)
GLUCOSE: 104 mg/dL — ABNORMAL HIGH (ref 70–99)
POTASSIUM: 5.2 mmol/L (ref 3.5–5.2)
SODIUM: 140 mmol/L (ref 134–144)
TOTAL PROTEIN: 6 g/dL (ref 6.0–8.5)

## 2023-03-16 LAB — GAMMA GT: GAMMA GLUTAMYL TRANSFERASE: 57 IU/L (ref 0–60)

## 2023-03-16 LAB — MAGNESIUM: MAGNESIUM: 1.9 mg/dL (ref 1.6–2.3)

## 2023-03-16 LAB — PHOSPHORUS: PHOSPHORUS, SERUM: 3.4 mg/dL (ref 3.0–4.3)

## 2023-03-16 LAB — BILIRUBIN, DIRECT: BILIRUBIN DIRECT: 0.25 mg/dL (ref 0.00–0.40)

## 2023-03-17 DIAGNOSIS — Z944 Liver transplant status: Principal | ICD-10-CM

## 2023-03-17 DIAGNOSIS — Z796 Long-term use of immunosuppressant medication: Principal | ICD-10-CM

## 2023-03-17 LAB — TACROLIMUS LEVEL: TACROLIMUS BLOOD: 17.7 ng/mL (ref 2.0–20.0)

## 2023-03-17 NOTE — Unmapped (Signed)
This onboarding is for the following medications:  1) Prevymis  2) Prograf      [facility]   Patient Onboarding/Medication Counseling    Ms.Boulton is a 47 y.o. female with a liver transplant who I am counseling today on continuation of therapy.  I am speaking to the patient.    Was a Nurse, learning disability used for this call? No    Verified patient's date of birth / HIPAA.    Specialty medication(s) to be sent: Transplant: None- pt declined tacrolimus and prevymis today.      Non-specialty medications/supplies to be sent: asa, gabapentin, pantoprazole, prednisone      Medications not needed at this time: none     The patient declined counseling on missed dose instructions, goals of therapy, side effects and monitoring parameters, warnings and precautions, and storage, handling precautions, and disposal because they have taken the medication previously. The information in the declined sections below are for informational purposes only and was not discussed with patient.     Prograf (tacrolimus)    Medication & Administration     Dosage: Take 6 capsules (6mg  total) two times a day.     Administration:   May take with or without food  Take 12 hours apart    Adherence/Missed dose instructions:  Take a missed dose as soon as you think about it.  If it is close to the time for your next dose, skip the missed dose and go back to your normal time.  Do not take 2 doses at the same time or extra doses.    Goals of Therapy     To prevent organ rejection    Side Effects & Monitoring Parameters     Common side effects  Dizziness  Fatigue  Headache  Stuffy nose or sore throat  Nausea, vomiting, stomach pain, diarrhea, constipation  Heartburn  Back or joint pain  Increased risk of infection    The following side effects should be reported to the provider:  Allergic reaction  Kidney issues (change in quantity or urine passed, blood in urine, or weight gain)  High blood pressure (dizziness, change in eyesight, headache)  Electrolyte issues (change in mood, confusion, muscle pain, or weakness)  Abnormal breathing  Shakiness  Unexplained bleeding or bruising (gums bleeding, blood in urine, nosebleeds, any abnormal bleeding)  Signs of infection (fever, cough, wounds that will not heal)  Skin changes (sores, paleness, new or changed bumps or moles)    Monitoring Parameters  Renal function  Liver function  Glucose levels  Blood pressure  Tacrolimus trough levels  Cardiac monitoring (for QT prolongation)      Contraindications, Warnings, & Precautions     Black Box Warning: Infections - immunosuppressant agents increase the risk of infection that may lead to hospitalization or death  Black Box Warning: Malignancy - immunosuppressant agents may be associated with the development of malignancies that may lead to hospitalization or death  Limit or avoid sun and ultraviolet light exposure, use appropriate sun protection  Myocardial hypertrophy -avoid use in patients with congenital long QT syndrome  Diabetes mellitus - the risk for new-onset diabetes and insulin-dependent post-transplant diabetes mellitus is increased with tacrolimus use after transplantation  GI perforation  Hyperkalemia  Hypertension  Nephrotoxicity  Neurotoxicity  This is a narrow therapeutic index drug. Do not switch manufacturers without first talking to the provider.    Drug/Food Interactions     Medication list reviewed in Epic. The patient was instructed to  inform the care team before taking any new medications or supplements. .  CYP3A4 Inhibitors (Moderate) may increase the serum concentration of Tacrolimus Clinic is aware and monitoring.  Avoid alcohol  Avoid grapefruit or grapefruit juice  Avoid live vaccines    Storage, Handling Precautions, & Disposal     Store at room temperature  Keep away from children and pets        The patient declined counseling on missed dose instructions, goals of therapy, side effects and monitoring parameters, warnings and precautions, and storage, handling precautions, and disposal because they have taken the medication previously. The information in the declined sections below are for informational purposes only and was not discussed with patient.     Prevymis (Letermovir)    Medication & Administration     Dosage: Take 1 tablet (44m) by mouth once daily    Administration:   Take with or without food  Swallow tablet whole    Adherence/Missed dose instructions:  Take a missed dose as soon as you think about it  If it is close to the time for your next dose, skip the missed dose and go back to scheduled time    Goals of Therapy     Prevent cytomegalovirus (CMV) infection in adult recipients of stem cell transplant    Side Effects & Monitoring Parameters     Common side effects  Diarrhea, nausea  Cough  Headache  Loss of strength or energy  Abdominal pain  Vomiting    The following side effects should be reported to the provider:  Allergic reaction  Swelling of arms or legs    Monitoring Parameters:  Monitor serum creatinine    Contraindications, Warnings, & Precautions     Not recommended in patients with severe hepatic impairment (Child-Pugh class C)  Use caution and closely monitor serum creatinine in patients with CrCl <50 ml/min    Drug/Food Interactions     Medication list reviewed in Epic. The patient was instructed to inform the care team before taking any new medications or supplements.  CYP3A4 Inhibitors (Moderate) may increase the serum concentration of Tacrolimus Clinic is aware and monitoring      Storage, Handling Precautions, & Disposal     Store at room temperature  Keep away from children and pets      Current Medications (including OTC/herbals), Comorbidities and Allergies     Current Outpatient Medications   Medication Sig Dispense Refill    acetaminophen (TYLENOL) 325 MG tablet Take 1-2 tablets (325-650 mg total) by mouth every eight (8) hours as needed for pain or fever. 60 tablet 11    aspirin (ECOTRIN) 81 MG tablet Take 1 tablet (81 mg total) by mouth daily. 90 tablet 3    blood sugar diagnostic (GLUCOSE BLOOD) Strp Use to check blood sugar as directed with insulin 3 times a day & for symptoms of high or low blood sugar. 100 strip 0    blood-glucose meter kit Use as instructed 1 each 0    blood-glucose meter Misc Use as directed 1 each 0    calcium carbonate (TUMS) 200 mg calcium (500 mg) chewable tablet Chew 1 tablet (500 mg total) Three (3) times a day. 150 tablet 3    docusate sodium (COLACE) 100 MG capsule Take 1 capsule (100 mg total) by mouth two (2) times a day as needed for constipation. 60 capsule 11    enoxaparin (LOVENOX) 40 mg/0.4 mL Syrg Inject 0.4 mL (40 mg total) under the skin every  twelve (12) hours. 24 mL 1    gabapentin (NEURONTIN) 400 MG capsule Take 1 capsule (400 mg total) by mouth Three (3) times a day. 90 capsule 3    insulin lispro (HUMALOG) 100 unit/mL injection pen Inject 1 Units under the skin Three (3) times a day before meals.      lancets Misc Use to check blood sugar as directed with insulin 3 times a day & for symptoms of high or low blood sugar. 100 each 0    letermovir (PREVYMIS) 480 mg tablet Take 1 tablet (480 mg total) by mouth daily. 30 tablet 2    lidocaine 4 % patch Place 1 patch on skin around the incision for 12 hours nightly, then remove for 12 hours. Do not place on open skin or the incision directly. 39 patch 0    magnesium oxide-Mg AA chelate (MAGNESIUM, AMINO ACID CHELATE,) 133 mg Take 1 tablet by mouth two (2) times a day. 100 tablet 6    methocarbamol (ROBAXIN) 500 MG tablet Take 1 tablet (500 mg total) by mouth four (4) times a day. 120 tablet 1    pantoprazole (PROTONIX) 40 MG tablet Take 1 tablet (40 mg total) by mouth daily. 30 tablet 11    pen needle, diabetic 32 gauge x 5/32 (4 mm) Ndle Use with insulin up to 4 times/day as needed. 100 each 0    [START ON 03/20/2023] pentamidine in sterile water Inhale 6 mL (300 mg total) every twenty-eight (28) days. polyethylene glycol (MIRALAX) 17 gram packet Mix 1 packet in  4 to 8 ounces of liquid and drink daily as needed. 30 packet 11    predniSONE (DELTASONE) 5 MG tablet Take 1 tablet (5 mg total) by mouth daily. 90 tablet 3    SITagliptin phosphate (JANUVIA) 100 MG tablet Take 1 tablet (100 mg total) by mouth daily. 90 tablet 3    tacrolimus (PROGRAF) 1 MG capsule Take 6 capsules (6 mg total) by mouth two (2) times a day. 360 capsule 11     No current facility-administered medications for this visit.       Allergies   Allergen Reactions    Penicillins Other (See Comments)     Had a rash with penicillin when she was 47 years old. She tolerated piperacillin/tazobactam 08/25/2016-08/28/2016 with itching without rash.       Patient Active Problem List   Diagnosis    Decompensated hepatic cirrhosis (CMS-HCC)    Autoimmune hepatitis (CMS-HCC)    Peripheral edema    Secondary esophageal varices without bleeding (CMS-HCC)    Sepsis (CMS-HCC)    Lower abdominal pain    Hyperammonemia (CMS-HCC)    Fever    Immunocompromised patient (CMS-HCC)    Metabolic acidosis    Leukopenia    Thrombocytopenia (CMS-HCC)    Hypokalemia    Abdominal pain    Pancytopenia (CMS-HCC)    Cellulitis of right lower extremity    Septic shock (CMS-HCC)    Obesity (BMI 30-39.9)    Acute kidney injury (CMS-HCC)    Coagulopathy (CMS-HCC)    Anasarca    Liver transplant recipient (CMS-HCC)    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    Appropriateness of Therapy     Acute infections noted within Epic:  No active infections  Patient reported infection: None    Is medication and dose appropriate based  on diagnosis and infection status? Yes    Prescription has been clinically reviewed: Yes      Baseline Quality of Life Assessment      How many days over the past month did your liver transplant  keep you from your normal activities? For example, brushing your teeth or getting up in the morning. 0    Financial Information     Medication Assistance provided: None Required    Anticipated copay of $4 each Prevymis and tacrolimus reviewed with patient. Verified delivery address.    Delivery Information     Scheduled delivery date: 03/19/23    Expected start date: 03/19/23      Medication will be delivered via UPS to the prescription address in [place].  This shipment will not require a signature.      Explained the services we provide at [facility] and that each month we would call to set up refills.  Stressed importance of returning phone calls so that we could ensure they receive their medications in time each month.  Informed patient that we should be setting up refills 7-10 days prior to when they will run out of medication.  A pharmacist will reach out to perform a clinical assessment periodically.  Informed patient that a welcome packet, containing information about our pharmacy and other support services, a Notice of Privacy Practices, and a drug information handout will be sent.      The patient or caregiver noted above participated in the development of this care plan and knows that they can request review of or adjustments to the care plan at any time.      Patient or caregiver verbalized understanding of the above information as well as how to contact the pharmacy at [phone] option 4 with any questions/concerns.  The pharmacy is open Monday through Friday 8:30am-4:30pm.  A pharmacist is available 24/7 via pager to answer any clinical questions they may have.    Patient Specific Needs     Does the patient have any physical, cognitive, or cultural barriers? No    Does the patient have adequate living arrangements? (i.e. the ability to store and take their medication appropriately) Yes    Did you identify any home environmental safety or security hazards? No    Patient prefers to have medications discussed with  Patient     Is the patient or caregiver able to read and understand education materials at a high school level or above? Yes    Patient's primary language is  English     Is the patient high risk? No    SOCIAL DETERMINANTS OF HEALTH     At the [facility], we have learned that life circumstances - like trouble affording food, housing, utilities, or transportation can affect the health of many of our patients.   That is why we wanted to ask: are you currently experiencing any life circumstances that are negatively impacting your health and/or quality of life? Patient declined to answer    Social Determinants of Health     Financial Resource Strain: Low Risk  (02/23/2023)    Overall Financial Resource Strain (CARDIA)     Difficulty of Paying Living Expenses: Not very hard   Internet Connectivity: No Internet connectivity concern identified (02/23/2023)    Internet Connectivity     Do you have access to internet services: Yes     How do you connect to the internet: Personal Device at home  Is your internet connection strong enough for you to watch video on your device without major problems?: Yes     Do you have enough data to get through the month?: Yes     Does at least one of the devices have a camera that you can use for video chat?: Yes   Food Insecurity: No Food Insecurity (02/23/2023)    Hunger Vital Sign     Worried About Running Out of Food in the Last Year: Never true     Ran Out of Food in the Last Year: Never true   Tobacco Use: Low Risk  (03/08/2023)    Patient History     Smoking Tobacco Use: Never     Smokeless Tobacco Use: Never     Passive Exposure: Not on file   Housing/Utilities: Low Risk  (02/23/2023)    Housing/Utilities     Within the past 12 months, have you ever stayed: outside, in a car, in a tent, in an overnight shelter, or temporarily in someone else's home (i.e. couch-surfing)?: No     Are you worried about losing your housing?: No     Within the past 12 months, have you been unable to get utilities (heat, electricity) when it was really needed?: No   Alcohol Use: Not At Risk (02/23/2023)    Alcohol Use     How often do you have a drink containing alcohol?: Never     How many drinks containing alcohol do you have on a typical day when you are drinking?: 1 - 2     How often do you have 5 or more drinks on one occasion?: Never   Transportation Needs: No Transportation Needs (02/23/2023)    PRAPARE - Transportation     Lack of Transportation (Medical): No     Lack of Transportation (Non-Medical): No   Substance Use: Low Risk  (02/23/2023)    Substance Use     Taken prescription drugs for non-medical reasons: Never     Taken illegal drugs: Never     Patient indicated they have taken drugs in the past year for non-medical reasons: Yes, [positive answer(s)]: Not on file   Health Literacy: Medium Risk (02/23/2023)    Health Literacy     : Sometimes   Physical Activity: Inactive (02/23/2023)    Exercise Vital Sign     Days of Exercise per Week: 0 days     Minutes of Exercise per Session: 0 min   Interpersonal Safety: Not At Risk (02/23/2023)    Interpersonal Safety     Unsafe Where You Currently Live: No     Physically Hurt by Anyone: No     Abused by Anyone: No   Stress: No Stress Concern Present (02/23/2023)    [employer] of Occupational Health - Occupational Stress Questionnaire     Feeling of Stress : Not at all   Intimate Partner Violence: Not At Risk (02/23/2023)    Humiliation, Afraid, Rape, and Kick questionnaire     Fear of Current or Ex-Partner: No     Emotionally Abused: No     Physically Abused: No     Sexually Abused: No   Depression: Not at risk (02/23/2023)    PHQ-2     PHQ-2 Score: 0   Social Connections: Socially Integrated (02/23/2023)    Social Connection and Isolation Panel [NHANES]     Frequency of Communication with Friends and Family: More than three times a week     Frequency of  Social Gatherings with Friends and Family: More than three times a week     Attends Religious Services: More than 4 times per year     Active Member of [employer] or Organizations: Yes     Attends Engineer, structural: More than 4 times per year     Marital Status: Married       Would you be willing to receive help with any of the needs that you have identified today? Not applicable       Tera Helper, [facility]  [facility] Specialty Pharmacist

## 2023-03-17 NOTE — Unmapped (Signed)
Patient's tac dose remains elevated above goal. Discussed with PharmD Chargualaf, who felt this was r/t starting letermovir and recommended holding 2 doses over the next day and restarting tomorrow evening at 4mg /3mg  dosage. [employer], [place] [phone]), and reached out to the patient to relay tacrolimus dose adjustment and to hold for the next 24 hrs. Patient repeated instructions back in Spanish to interpreter and confirmed understanding.

## 2023-03-18 DIAGNOSIS — Z944 Liver transplant status: Principal | ICD-10-CM

## 2023-03-18 LAB — CBC W/ DIFFERENTIAL
BANDED NEUTROPHILS ABSOLUTE COUNT: 0.1 10*3/uL (ref 0.0–0.1)
BASOPHILS ABSOLUTE COUNT: 0 10*3/uL (ref 0.0–0.2)
BASOPHILS RELATIVE PERCENT: 1 %
EOSINOPHILS ABSOLUTE COUNT: 0.1 10*3/uL (ref 0.0–0.4)
EOSINOPHILS RELATIVE PERCENT: 2 %
HEMATOCRIT: 28.2 % — ABNORMAL LOW (ref 34.0–46.6)
HEMOGLOBIN: 8.8 g/dL — ABNORMAL LOW (ref 11.1–15.9)
IMMATURE GRANULOCYTES: 4 %
LYMPHOCYTES ABSOLUTE COUNT: 0.4 10*3/uL — ABNORMAL LOW (ref 0.7–3.1)
LYMPHOCYTES RELATIVE PERCENT: 13 %
MEAN CORPUSCULAR HEMOGLOBIN CONC: 31.2 g/dL — ABNORMAL LOW (ref 31.5–35.7)
MEAN CORPUSCULAR HEMOGLOBIN: 27.9 pg (ref 26.6–33.0)
MEAN CORPUSCULAR VOLUME: 90 fL (ref 79–97)
MONOCYTES ABSOLUTE COUNT: 0.4 10*3/uL (ref 0.1–0.9)
MONOCYTES RELATIVE PERCENT: 14 %
NEUTROPHILS ABSOLUTE COUNT: 2.1 10*3/uL (ref 1.4–7.0)
NEUTROPHILS RELATIVE PERCENT: 66 %
PLATELET COUNT: 137 10*3/uL — ABNORMAL LOW (ref 150–450)
RED BLOOD CELL COUNT: 3.15 x10E6/uL — ABNORMAL LOW (ref 3.77–5.28)
RED CELL DISTRIBUTION WIDTH: 15.4 % (ref 11.7–15.4)
WHITE BLOOD CELL COUNT: 3.1 10*3/uL — ABNORMAL LOW (ref 3.4–10.8)

## 2023-03-18 LAB — COMPREHENSIVE METABOLIC PANEL
ALBUMIN: 3.9 g/dL (ref 3.9–4.9)
ALKALINE PHOSPHATASE: 98 IU/L (ref 44–121)
ALT (SGPT): 14 IU/L (ref 0–32)
AST (SGOT): 18 IU/L (ref 0–40)
BILIRUBIN TOTAL (MG/DL) IN SER/PLAS: 0.6 mg/dL (ref 0.0–1.2)
BLOOD UREA NITROGEN: 13 mg/dL (ref 6–24)
BUN / CREAT RATIO: 18 (ref 9–23)
CALCIUM: 8.9 mg/dL (ref 8.7–10.2)
CHLORIDE: 103 mmol/L (ref 96–106)
CO2: 22 mmol/L (ref 20–29)
CREATININE: 0.72 mg/dL (ref 0.57–1.00)
EGFR: 104 mL/min/{1.73_m2}
GLOBULIN, TOTAL: 2.4 g/dL (ref 1.5–4.5)
GLUCOSE: 101 mg/dL — ABNORMAL HIGH (ref 70–99)
POTASSIUM: 4.9 mmol/L (ref 3.5–5.2)
SODIUM: 140 mmol/L (ref 134–144)
TOTAL PROTEIN: 6.3 g/dL (ref 6.0–8.5)

## 2023-03-18 LAB — BILIRUBIN, DIRECT: BILIRUBIN DIRECT: 0.22 mg/dL (ref 0.00–0.40)

## 2023-03-18 LAB — PHOSPHORUS: PHOSPHORUS, SERUM: 4.3 mg/dL (ref 3.0–4.3)

## 2023-03-18 LAB — MAGNESIUM: MAGNESIUM: 1.9 mg/dL (ref 1.6–2.3)

## 2023-03-18 LAB — GAMMA GT: GAMMA GLUTAMYL TRANSFERASE: 51 IU/L (ref 0–60)

## 2023-03-18 MED ORDER — TACROLIMUS 1 MG CAPSULE, IMMEDIATE-RELEASE
ORAL_CAPSULE | ORAL | 11 refills | 30 days | Status: CP
Start: 2023-03-18 — End: ?

## 2023-03-19 DIAGNOSIS — Z5181 Encounter for therapeutic drug level monitoring: Principal | ICD-10-CM

## 2023-03-19 DIAGNOSIS — Z9189 Other specified personal risk factors, not elsewhere classified: Principal | ICD-10-CM

## 2023-03-19 DIAGNOSIS — E612 Magnesium deficiency: Principal | ICD-10-CM

## 2023-03-19 DIAGNOSIS — Z944 Liver transplant status: Principal | ICD-10-CM

## 2023-03-20 LAB — CBC W/ DIFFERENTIAL
BANDED NEUTROPHILS ABSOLUTE COUNT: 0 10*3/uL (ref 0.0–0.1)
BASOPHILS ABSOLUTE COUNT: 0 10*3/uL (ref 0.0–0.2)
BASOPHILS RELATIVE PERCENT: 1 %
EOSINOPHILS ABSOLUTE COUNT: 0.1 10*3/uL (ref 0.0–0.4)
EOSINOPHILS RELATIVE PERCENT: 2 %
HEMATOCRIT: 29.1 % — ABNORMAL LOW (ref 34.0–46.6)
HEMOGLOBIN: 9 g/dL — ABNORMAL LOW (ref 11.1–15.9)
IMMATURE GRANULOCYTES: 1 %
LYMPHOCYTES ABSOLUTE COUNT: 0.4 10*3/uL — ABNORMAL LOW (ref 0.7–3.1)
LYMPHOCYTES RELATIVE PERCENT: 10 %
MEAN CORPUSCULAR HEMOGLOBIN CONC: 30.9 g/dL — ABNORMAL LOW (ref 31.5–35.7)
MEAN CORPUSCULAR HEMOGLOBIN: 28.4 pg (ref 26.6–33.0)
MEAN CORPUSCULAR VOLUME: 92 fL (ref 79–97)
MONOCYTES ABSOLUTE COUNT: 0.3 10*3/uL (ref 0.1–0.9)
MONOCYTES RELATIVE PERCENT: 9 %
NEUTROPHILS ABSOLUTE COUNT: 2.7 10*3/uL (ref 1.4–7.0)
NEUTROPHILS RELATIVE PERCENT: 77 %
PLATELET COUNT: 137 10*3/uL — ABNORMAL LOW (ref 150–450)
RED BLOOD CELL COUNT: 3.17 x10E6/uL — ABNORMAL LOW (ref 3.77–5.28)
RED CELL DISTRIBUTION WIDTH: 15.3 % (ref 11.7–15.4)
WHITE BLOOD CELL COUNT: 3.5 10*3/uL (ref 3.4–10.8)

## 2023-03-20 LAB — COMPREHENSIVE METABOLIC PANEL
ALBUMIN: 4 g/dL (ref 3.9–4.9)
ALKALINE PHOSPHATASE: 101 IU/L (ref 44–121)
ALT (SGPT): 13 IU/L (ref 0–32)
AST (SGOT): 15 IU/L (ref 0–40)
BILIRUBIN TOTAL (MG/DL) IN SER/PLAS: 0.5 mg/dL (ref 0.0–1.2)
BLOOD UREA NITROGEN: 12 mg/dL (ref 6–24)
BUN / CREAT RATIO: 18 (ref 9–23)
CALCIUM: 8.8 mg/dL (ref 8.7–10.2)
CHLORIDE: 104 mmol/L (ref 96–106)
CO2: 22 mmol/L (ref 20–29)
CREATININE: 0.67 mg/dL (ref 0.57–1.00)
EGFR: 109 mL/min/{1.73_m2}
GLOBULIN, TOTAL: 2.4 g/dL (ref 1.5–4.5)
GLUCOSE: 117 mg/dL — ABNORMAL HIGH (ref 70–99)
POTASSIUM: 4.4 mmol/L (ref 3.5–5.2)
SODIUM: 141 mmol/L (ref 134–144)
TOTAL PROTEIN: 6.4 g/dL (ref 6.0–8.5)

## 2023-03-20 LAB — BILIRUBIN, DIRECT: BILIRUBIN DIRECT: 0.24 mg/dL (ref 0.00–0.40)

## 2023-03-20 LAB — PHOSPHORUS: PHOSPHORUS, SERUM: 3.9 mg/dL (ref 3.0–4.3)

## 2023-03-20 LAB — MAGNESIUM: MAGNESIUM: 1.9 mg/dL (ref 1.6–2.3)

## 2023-03-20 LAB — TACROLIMUS LEVEL: TACROLIMUS BLOOD: 14.7 ng/mL (ref 2.0–20.0)

## 2023-03-20 LAB — GAMMA GT: GAMMA GLUTAMYL TRANSFERASE: 49 IU/L (ref 0–60)

## 2023-03-21 NOTE — Unmapped (Signed)
[facility] CLINIC PHARMACY NOTE  Lo Paap  [id number]    Medication changes today:   1.Restart mycophenolate 250 mg BID   2. Start acyclovir 400 mg BID  3. Stop calcium carbonate 500 mg TID    Education/Adherence tools provided today:  - Provided updated medication list  - Provided additional education on immunosuppression and transplant related medications including reviewing indications of medications, dosing and side effects  - Discussed adherence reminder tools such as cell phone alarms    Follow up items:  1. Goal of understanding indications and dosing of immunosuppression medications  2. Pentamidine scheduling    Next visit with pharmacy in 1-3 months  ____________________________________________________________________    Truitt Merle is a 47 y.o. 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     Immunologic Risk: first transplant    Other PMH significant for n/a    Post op course uncomplicated    Readmitted 7/6-7/9 with back pain and elevated bilirubin  Liver ultrasound showed concern for possible thrombosis or stenosis at common hepatic artery for which LMWH ppx 30 mg BID with plans for continuation for ~6 weeks.    Rejection History: NTD  Infection History: NTD  ___________________________________________________________________    Last seen by pharmacy 2 weeks ago    Interval History:   7/15: Stop Humalog, hold MMF  7/15-7/17 admitted to [facility] from clinic with cytopenia, received dose of gCSF, 2 units PRBC, transitioned from valganciclovir to letermovir, enoxaparin increased to 40 mg BID    Seen by pharmacy today for: medication management, blood glucose management and education, and pill box fill and adherence education    CC:  Patient complains of  mild pain that comes and goes at incision site    General: No issues  Neuro:  occasional HA and feels tremulous inside  CV: No issues  Resp: No issues  GI: Constipation   GU: No issues  Derm: No issues  Psych: No issues.    Fluid Status:   Edema no SOB no  Intake: 80-100 oz    Plan: Continue to monitor      Vitals:    03/22/23 0945   BP: 143/74   Pulse: 75   Temp: 35.9 ??C (96.6 ??F)     ___________________________________________________________________    Allergies   Allergen Reactions    Penicillins Other (See Comments)     Had a rash with penicillin when she was 47 years old. She tolerated piperacillin/tazobactam 08/25/2016-08/28/2016 with itching without rash.       Medications reviewed in EPIC medication station and updated today by the clinical pharmacist practitioner.    Current Outpatient Medications   Medication Instructions    acetaminophen (TYLENOL) 325-650 mg, Oral, Every 8 hours PRN    acyclovir (ZOVIRAX) 400 mg, Oral, 2 times a day (standard)    aspirin (ECOTRIN) 81 mg, Oral, Daily (standard)    blood sugar diagnostic (GLUCOSE BLOOD) Strp Use to check blood sugar as directed with insulin 3 times a day & for symptoms of high or low blood sugar.    blood-glucose meter kit Use as instructed    blood-glucose meter Misc Use as directed    calcium carbonate (TUMS) 500 mg, Oral, 3 times a day (standard)    docusate sodium (COLACE) 100 mg, Oral, 2 times a day PRN    enoxaparin (LOVENOX) 40 mg, Subcutaneous, Every 12 hours    gabapentin (NEURONTIN) 400 mg, Oral, 3 times a day (standard)  insulin lispro (HUMALOG) 1 Units, Subcutaneous, 3 times a day (AC)    lancets Misc Use to check blood sugar as directed with insulin 3 times a day & for symptoms of high or low blood sugar.    lidocaine 4 % patch Place 1 patch on skin around the incision for 12 hours nightly, then remove for 12 hours. Do not place on open skin or the incision directly.    magnesium oxide-Mg AA chelate (MAGNESIUM, AMINO ACID CHELATE,) 133 mg 1 tablet, Oral, 2 times a day (standard)    methocarbamol (ROBAXIN) 500 mg, Oral, 4 times a day    mycophenolate (CELLCEPT) 250 mg, Oral, 2 times a day (standard)    pantoprazole (PROTONIX) 40 mg, Oral, Daily (standard)    pen needle, diabetic 32 gauge x 5/32 (4 mm) Ndle Use with insulin up to 4 times/day as needed.    pentamidine in sterile water 300 mg, Inhalation, Every 28 days    polyethylene glycol (MIRALAX) 17 gram packet Mix 1 packet in  4 to 8 ounces of liquid and drink daily as needed.    predniSONE (DELTASONE) 5 mg, Oral, Daily (standard)    PREVYMIS 480 mg, Oral, Daily (standard)    SITagliptin phosphate (JANUVIA) 100 mg, Oral, Daily (standard)    tacrolimus (PROGRAF) 1 MG capsule Take 4 capsules (4 mg total) by mouth daily AND 3 capsules (3 mg total) nightly.         GRAFT FUNCTION: stable    Lab Results   Component Value Date    AST 22 03/22/2023    AST 15 03/19/2023    ALT 21 03/22/2023    ALT 13 03/19/2023    Total Bilirubin 0.6 03/22/2023    Total Bilirubin 0.5 03/19/2023        Zero hour biopsy: negative for malignancy  Biopsies to date: NTD      Renal Function: stable    Lab Results   Component Value Date    Creatinine 0.64 03/22/2023    Creatinine 0.67 03/22/2023    Creatinine 0.67 03/19/2023    Creatinine 0.72 03/17/2023    Creatinine 0.71 03/15/2023    Creatinine 0.71 03/12/2023       Proteinuria/UPC:  none.  No results found for: PCRATIOUR        CURRENT IMMUNOSUPPRESSION:    Tacrolimus (Prograf) 4 mg every morning + 3 mg every evening    Tacrolimus Goal: 8 - 10   Prednisone 5 mg daily    IMMUNOSUPPRESSION DRUG LEVELS:  Lab Results   Component Value Date    Tacrolimus, Trough 16.7 (H) 03/10/2023    Tacrolimus, Trough 10.4 03/09/2023    Tacrolimus, Trough 10.7 02/23/2023    Tacrolimus Lvl 6.1 03/19/2023    Tacrolimus Lvl 14.7 03/17/2023    Tacrolimus Lvl 17.7 03/15/2023    Tacrolimus, Timed 8.4 03/22/2023     No results found for: CYCLO  No results found for: EVEROLIMUS  No results found for: SIROLIMUS    Prograf level is accurate 12 hour trough    Patient is tolerating immunosuppression well    WBC/ANC:  wnl  Lab Results   Component Value Date    WBC 3.4 (L) 03/22/2023    WBC 3.5 03/19/2023       Plan:  Restart MMF 250 mg BID        OI Prophylaxis:   CMV Status: D-/ R+, moderate risk.   CMV prophylaxis:  letermovir 480 mg  x 3 months per protocol (end  05/18/23) - valcyte stopped for neturopenia  No results found for: CMVCP    PCP Prophylaxis: pentamidine 300 mg inhalation monthly x 6 months.    Thrush: completed in hospital    Patient is  tolerating infectious prophylaxis well    Plan:  Start acyclovir 400 mg BID for HSV prophylaxis .      HAT ppx:  Meds currently on: aspirin 81 mg daily, LMWH 40 mg q12h (goal anti-Xa 0.2-0.4)  Plan: Continue LMWH 40 mg q12h. No further levels needed. Plan to obtain liver US prior to discontinuation around 04/05/23    CAD prevention:  Statin therapy: Indicated; currently on no statin  Plan: Continue to monitor       BP: Goal < 140/90. Clinic vitals reported above  Home BP ranges: 100s-110/70s    Current meds include: none  Plan: within goal. Continue to monitor    Anemia of CKD:  H/H:   Lab Results   Component Value Date    HGB 9.4 (L) 03/22/2023     Lab Results   Component Value Date    HCT 28.0 (L) 03/22/2023     Iron panel:  Lab Results   Component Value Date    IRON 51 03/22/2023    TIBC 229 (L) 03/22/2023    FERRITIN 396.4 (H) 03/08/2023     Lab Results   Component Value Date    Iron Saturation (%) 22 03/22/2023       Prior ESA use: none post txp      Plan: stable. Continue to monitor.     DM:   Lab Results   Component Value Date    A1C 4.6 (L) 02/14/2023   . Goal A1c < 7  History of Dm?  No - post txp hyperglycemia  Established with endocrinologist/PCP for BG managment? No  Currently on: sitagliptin 100 mg daily  Not regularly checking BG at home    Diet:3 meals per day + 2 protein shakes  Exercise:not yet  Hypoglycemia: no  Plan: No change.  Continue to monitor      Electrolytes: wnl  Lab Results   Component Value Date    Potassium 4.7 03/22/2023    Potassium 4.4 03/19/2023    Potassium, Bld 3.8 09/07/2022    Sodium 138 03/22/2023    Sodium 141 03/19/2023    Sodium Whole Blood 131 (L) 09/07/2022    Magnesium 1.7 03/22/2023    Magnesium 1.9 03/19/2023    CO2 28.0 03/22/2023    CO2 22 03/19/2023       Meds currently on: TUMS 1 tab TID, magnesium plus protein 133 mg BID  Plan: Stop TUMS 1 tab TID. Continue to monitor     GI/BM: pt reports constipation  Meds currently on: docusate PRN, Miralax PRN, pantoprazole 40 mg daily  Plan: No change.  Continue to monitor    Pain: pt reports mild pain at incision site that comes and goes  Meds currently on: APAP PRN, methocarbamol 500 mg QID, gabapentin 400 mg TID   Plan: No change.  Continue to monitor    Bone health:   Vitamin D Level: none available. Goal > 30.   No results found for: VITDTOT, VITDTOTAL    Lab Results   Component Value Date    Calcium 9.1 03/22/2023    Calcium 9.0 03/22/2023    Calcium 8.8 03/19/2023    Calcium 8.9 03/17/2023       Last DEXA results:   normal in 2018  Current meds  include: none  Plan: Vitamin D level  needs to be drawn with next lab schedule. Continue to monitor.     Women's/Men's Health:  Laresha Princessa Sweda is a 47 y.o. Female perimenopausal. Patient reports no men's/women's health issues  Plan: Continue to monitor    Immunizations:  Influenza [Annual]: Received 2023    PPSV23: Received 08/2016  PCV20: Received 08/2022    Shingrix Zoster [2 doses, 2 - 6 months apart]: 1st dose given 08/2022 and 2nd dose due 10/2022    COVID-19 [3 primary doses, 2 boosters]: 1st dose given 09/22/22    Immunization status: up to date and documented.    Pharmacy preference:  [facility]  Medication Refills:  Lidocaine patch, Miralax, mycophenolate sent to [facility]  Medication Access:  N/a    Adherence: Patient has average understanding of medications; was not able to independently identify names/doses of immunosuppressants and OI meds.  Patient  does fill their own pill box on a regular basis at home.  Patient brought medication card:yes  Pill box:was correct  Plan: provided moderate adherence counseling/intervention    Patient was reviewed with  Dr. Celine Mans  who was agreement with the stated plan:     During this visit, the following was completed:   BG log data assessment  BP log data assessment  Labs ordered and evaluated  complex treatment plan >1 DS   I spent a total of 30 minutes face to face with the patient delivering clinical care and providing education/counseling.    All questions/concerns were addressed to the patient's satisfaction.  __________________________________________    Beryle Lathe, PharmD  PGY2 Solid Organ Transplant Pharmacy Resident     Hazeline Junker, PharmD, CPP  Abdominal Transplant Clinical Pharmacist Practitioner

## 2023-03-22 ENCOUNTER — Institutional Professional Consult (permissible substitution): Admit: 2023-03-22 | Discharge: 2023-03-22 | Payer: PRIVATE HEALTH INSURANCE

## 2023-03-22 ENCOUNTER — Ambulatory Visit: Admit: 2023-03-22 | Discharge: 2023-03-22 | Payer: PRIVATE HEALTH INSURANCE

## 2023-03-22 DIAGNOSIS — I8290 Acute embolism and thrombosis of unspecified vein: Principal | ICD-10-CM

## 2023-03-22 DIAGNOSIS — D702 Other drug-induced agranulocytosis: Principal | ICD-10-CM

## 2023-03-22 DIAGNOSIS — Z944 Liver transplant status: Principal | ICD-10-CM

## 2023-03-22 DIAGNOSIS — Z9189 Other specified personal risk factors, not elsewhere classified: Principal | ICD-10-CM

## 2023-03-22 DIAGNOSIS — I748 Embolism and thrombosis of other arteries: Principal | ICD-10-CM

## 2023-03-22 DIAGNOSIS — D649 Anemia, unspecified: Principal | ICD-10-CM

## 2023-03-22 DIAGNOSIS — E612 Magnesium deficiency: Principal | ICD-10-CM

## 2023-03-22 DIAGNOSIS — Z5181 Encounter for therapeutic drug level monitoring: Principal | ICD-10-CM

## 2023-03-22 LAB — CBC W/ AUTO DIFF
BASOPHILS ABSOLUTE COUNT: 0 10*9/L (ref 0.0–0.1)
BASOPHILS ABSOLUTE COUNT: 0 10*9/L (ref 0.0–0.1)
BASOPHILS RELATIVE PERCENT: 1.2 %
BASOPHILS RELATIVE PERCENT: 0.8 %
EOSINOPHILS ABSOLUTE COUNT: 0.1 10*9/L (ref 0.0–0.5)
EOSINOPHILS ABSOLUTE COUNT: 0 10*9/L (ref 0.0–0.5)
EOSINOPHILS RELATIVE PERCENT: 2.1 %
EOSINOPHILS RELATIVE PERCENT: 0.9 %
HEMATOCRIT: 28 % — ABNORMAL LOW (ref 34.0–44.0)
HEMATOCRIT: 28 % — ABNORMAL LOW (ref 34.0–44.0)
HEMOGLOBIN: 9.4 g/dL — ABNORMAL LOW (ref 11.3–14.9)
HEMOGLOBIN: 9.4 g/dL — ABNORMAL LOW (ref 11.3–14.9)
LYMPHOCYTES ABSOLUTE COUNT: 0.4 10*9/L — ABNORMAL LOW (ref 1.1–3.6)
LYMPHOCYTES ABSOLUTE COUNT: 0.2 10*9/L — ABNORMAL LOW (ref 1.1–3.6)
LYMPHOCYTES RELATIVE PERCENT: 12.2 %
LYMPHOCYTES RELATIVE PERCENT: 7.2 %
MEAN CORPUSCULAR HEMOGLOBIN CONC: 33.5 g/dL (ref 32.0–36.0)
MEAN CORPUSCULAR HEMOGLOBIN CONC: 33.4 g/dL (ref 32.0–36.0)
MEAN CORPUSCULAR HEMOGLOBIN: 29.1 pg (ref 25.9–32.4)
MEAN CORPUSCULAR HEMOGLOBIN: 29 pg (ref 25.9–32.4)
MEAN CORPUSCULAR VOLUME: 87 fL (ref 77.6–95.7)
MEAN CORPUSCULAR VOLUME: 86.8 fL (ref 77.6–95.7)
MEAN PLATELET VOLUME: 7.8 fL (ref 6.8–10.7)
MEAN PLATELET VOLUME: 8 fL (ref 6.8–10.7)
MONOCYTES ABSOLUTE COUNT: 0.2 10*9/L — ABNORMAL LOW (ref 0.3–0.8)
MONOCYTES ABSOLUTE COUNT: 0.2 10*9/L — ABNORMAL LOW (ref 0.3–0.8)
MONOCYTES RELATIVE PERCENT: 7.5 %
MONOCYTES RELATIVE PERCENT: 5.7 %
NEUTROPHILS ABSOLUTE COUNT: 2.5 10*9/L (ref 1.8–7.8)
NEUTROPHILS ABSOLUTE COUNT: 2.9 10*9/L (ref 1.8–7.8)
NEUTROPHILS RELATIVE PERCENT: 77 %
NEUTROPHILS RELATIVE PERCENT: 85.4 %
PLATELET COUNT: 117 10*9/L — ABNORMAL LOW (ref 150–450)
PLATELET COUNT: 111 10*9/L — ABNORMAL LOW (ref 150–450)
RED BLOOD CELL COUNT: 3.22 10*12/L — ABNORMAL LOW (ref 3.95–5.13)
RED BLOOD CELL COUNT: 3.23 10*12/L — ABNORMAL LOW (ref 3.95–5.13)
RED CELL DISTRIBUTION WIDTH: 17.5 % — ABNORMAL HIGH (ref 12.2–15.2)
RED CELL DISTRIBUTION WIDTH: 17.3 % — ABNORMAL HIGH (ref 12.2–15.2)
WBC ADJUSTED: 3.2 10*9/L — ABNORMAL LOW (ref 3.6–11.2)
WBC ADJUSTED: 3.4 10*9/L — ABNORMAL LOW (ref 3.6–11.2)

## 2023-03-22 LAB — COMPREHENSIVE METABOLIC PANEL
ALBUMIN: 3.7 g/dL (ref 3.4–5.0)
ALBUMIN: 4 g/dL (ref 3.4–5.0)
ALKALINE PHOSPHATASE: 107 U/L (ref 46–116)
ALKALINE PHOSPHATASE: 107 U/L (ref 46–116)
ALT (SGPT): 19 U/L (ref 10–49)
ALT (SGPT): 21 U/L (ref 10–49)
ANION GAP: 4 mmol/L — ABNORMAL LOW (ref 5–14)
ANION GAP: 5 mmol/L (ref 5–14)
AST (SGOT): 21 U/L (ref <=34)
AST (SGOT): 22 U/L (ref <=34)
BILIRUBIN TOTAL: 0.5 mg/dL (ref 0.3–1.2)
BILIRUBIN TOTAL: 0.6 mg/dL (ref 0.3–1.2)
BLOOD UREA NITROGEN: 14 mg/dL (ref 9–23)
BLOOD UREA NITROGEN: 13 mg/dL (ref 9–23)
BUN / CREAT RATIO: 21
BUN / CREAT RATIO: 20
CALCIUM: 9 mg/dL (ref 8.7–10.4)
CALCIUM: 9.1 mg/dL (ref 8.7–10.4)
CHLORIDE: 106 mmol/L (ref 98–107)
CHLORIDE: 105 mmol/L (ref 98–107)
CO2: 30 mmol/L (ref 20.0–31.0)
CO2: 28 mmol/L (ref 20.0–31.0)
CREATININE: 0.67 mg/dL
CREATININE: 0.64 mg/dL
EGFR CKD-EPI (2021) FEMALE: >90 mL/min/{1.73_m2} (ref >=60)
EGFR CKD-EPI (2021) FEMALE: >90 mL/min/{1.73_m2} (ref >=60)
GLUCOSE RANDOM: 122 mg/dL — ABNORMAL HIGH (ref 70–99)
GLUCOSE RANDOM: 182 mg/dL — ABNORMAL HIGH (ref 70–179)
POTASSIUM: 4.1 mmol/L (ref 3.4–4.8)
POTASSIUM: 4.7 mmol/L (ref 3.4–4.8)
PROTEIN TOTAL: 6.9 g/dL (ref 5.7–8.2)
PROTEIN TOTAL: 7.4 g/dL (ref 5.7–8.2)
SODIUM: 140 mmol/L (ref 135–145)
SODIUM: 138 mmol/L (ref 135–145)

## 2023-03-22 LAB — BILIRUBIN, DIRECT
BILIRUBIN DIRECT: 0.3 mg/dL (ref 0.00–0.30)
BILIRUBIN DIRECT: 0.4 mg/dL — ABNORMAL HIGH (ref 0.00–0.30)

## 2023-03-22 LAB — IRON PANEL
IRON SATURATION: 22 % (ref 20–55)
IRON: 51 ug/dL
TOTAL IRON BINDING CAPACITY: 229 ug/dL — ABNORMAL LOW (ref 250–425)

## 2023-03-22 LAB — PHOSPHORUS
PHOSPHORUS: 3.6 mg/dL (ref 2.4–5.1)
PHOSPHORUS: 3.5 mg/dL (ref 2.4–5.1)

## 2023-03-22 LAB — TACROLIMUS LEVEL
TACROLIMUS BLOOD: 6.1 ng/mL (ref 2.0–20.0)
TACROLIMUS BLOOD: 8.4 ng/mL

## 2023-03-22 LAB — LACTATE DEHYDROGENASE: LACTATE DEHYDROGENASE: 202 U/L (ref 120–246)

## 2023-03-22 LAB — HAPTOGLOBIN: HAPTOGLOBIN: 85 mg/dL (ref 30–200)

## 2023-03-22 LAB — MAGNESIUM
MAGNESIUM: 1.7 mg/dL (ref 1.6–2.6)
MAGNESIUM: 1.7 mg/dL (ref 1.6–2.6)

## 2023-03-22 LAB — GAMMA GT
GAMMA GLUTAMYL TRANSFERASE: 61 U/L — ABNORMAL HIGH
GAMMA GLUTAMYL TRANSFERASE: 65 U/L — ABNORMAL HIGH

## 2023-03-22 LAB — LMWH ANTI-XA
HEPARIN LOW MOLECULAR WEIGHT: 0.11 [IU]/mL
HEPARIN LOW MOLECULAR WEIGHT: 0.37 [IU]/mL

## 2023-03-22 MED ORDER — LIDOCAINE 4 % TOPICAL PATCH
MEDICATED_PATCH | Freq: Every evening | TRANSDERMAL | 0 refills | 39 days | Status: CP
Start: 2023-03-22 — End: ?
  Filled 2023-03-22: qty 10, 10d supply, fill #0

## 2023-03-22 MED ORDER — POLYETHYLENE GLYCOL 3350 17 GRAM ORAL POWDER PACKET
PACK | Freq: Every day | ORAL | 11 refills | 30 days | Status: CP | PRN
Start: 2023-03-22 — End: ?
  Filled 2023-03-22: qty 30, 30d supply, fill #0

## 2023-03-22 MED ORDER — MYCOPHENOLATE MOFETIL 250 MG CAPSULE
ORAL_CAPSULE | Freq: Two times a day (BID) | ORAL | 11 refills | 30 days | Status: CP
Start: 2023-03-22 — End: 2024-03-21

## 2023-03-22 MED ORDER — ACYCLOVIR 400 MG TABLET
ORAL_TABLET | Freq: Two times a day (BID) | ORAL | 1 refills | 30 days | Status: CP
Start: 2023-03-22 — End: ?
  Filled 2023-03-22: qty 60, 30d supply, fill #0

## 2023-03-22 MED ADMIN — pentamidine (PENTAM) inhalation solution: 300 mg | RESPIRATORY_TRACT | @ 15:00:00 | Stop: 2023-03-22 | NDC 63323011310

## 2023-03-22 MED ADMIN — albuterol 2.5 mg /3 mL (0.083 %) nebulizer solution 2.5 mg: 2.5 mg | RESPIRATORY_TRACT | @ 15:00:00 | Stop: 2023-03-22 | NDC 76204020060

## 2023-03-22 NOTE — Unmapped (Signed)
FOLLOW UP CLINIC NOTE       Date of Service: 03/22/2023    Current complaint: Annual follow up for liver transplant      Assessment and Plan:   Alicia Armstrong is a 47 y.o. 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 Overall doing well    - Removed staples in clinic today, placed steri strips  - Will see back in clinic in 6 weeks and have a repeat liver US before then to evaluate conitnued need for lovenox  - Continue Lovenox 40 mg BID until then  - Restart MMF 250 mg BID  - Start Acyclovir 400 mg BID  - Stop Tums    History of Present Illness:   Alicia Armstrong is a 47 y.o. 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 Her immediate postop course was uncomplicated. She has been readmitted twice on 02/27/23 for back pain and elevated LFTs and 03/08/23 for cytopenia. She reports doing well at home since her last discharge on 7/17. 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 She has been working with pharmacy outpatient on her tacrolimus dose since she has been supra therapeutic. She continues to inject Lovenox 40 mg BID, AntiXa today is 0.11. She reports her appetite is improving. She feels she has more energy and wants to start helping out more at home.     Physical Exam:  BP 143/74  - Pulse 75  - Temp 35.9 ??C (96.6 ??F) (Tympanic)  - Ht 140.3 cm (4' 7.24)  - Wt 63.6 kg (140 lb 4.8 oz)  - BMI 32.33 kg/m??   General Appearance:  No acute distress, well appearing and well nourished.   Head:  Normocephalic, atraumatic.   Eyes:  No scleral icterus.   Pulmonary:    Normal respiratory effort on RA.   Cardiovascular:  Regular rate and rhythm.   Abdomen:   Abdominal scar well healed with pre-existing umbilical hernia.    Neurologic: Non-focal exam.         Lab Results   Component Value Date    WBC 3.2 (L) 03/22/2023    HGB 9.4 (L) 03/22/2023    HCT 28.0 (L) 03/22/2023    PLT 117 (L) 03/22/2023     Lab Results   Component Value Date    NA 140 03/22/2023    K 4.1 03/22/2023    CL 106 03/22/2023    CO2 30.0 03/22/2023    BUN 14 03/22/2023    CREATININE 0.67 03/22/2023    CALCIUM 9.0 03/22/2023    MG 1.7 03/22/2023    PHOS 3.6 03/22/2023      Lab Results   Component Value Date    ALKPHOS 107 03/22/2023    BILITOT 0.5 03/22/2023    BILIDIR 0.30 03/22/2023    PROT 6.9 03/22/2023    ALBUMIN 3.7 03/22/2023    ALT 19 03/22/2023    AST 21 03/22/2023    GGT 61 (H) 03/22/2023      Lab Results   Component Value Date    INR 1.10 02/27/2023    APTT 25.3 02/23/2023          IMAGING:  No results found.

## 2023-03-22 NOTE — Unmapped (Signed)
In-person Interpreter has been requested.    Please see patient pharmacy visit for documentation.

## 2023-03-22 NOTE — Unmapped (Signed)
Addended by: Genia Harold on: 03/22/2023 03:04 PM     Modules accepted: Orders

## 2023-03-22 NOTE — Unmapped (Signed)
Per providers orders, Albuterol and Pentamidine treatments were administered.  Patient tolerated it well with no complication.  See MAR for administration info.

## 2023-03-22 NOTE — Unmapped (Signed)
[facility] Specialty Medication Onboarding    Specialty Medication: Tacrolimus 1mg  capsule  Prior Authorization: Not Required   Financial Assistance: No - copay  <$25  Final Copay/Day Supply: $4 / 90 days    Insurance Restrictions: None     Notes to Pharmacist: Same price for 30ds and 90ds  Credit Card on File: no    The triage team has completed the benefits investigation and has determined that the patient is able to fill this medication at [facility]. Please contact the patient to complete the onboarding or follow up with the prescribing physician as needed.

## 2023-03-22 NOTE — Unmapped (Addendum)
Patient's 7/26 Tac resulted below goal over the weekend, reflecting instruction on afternoon of 7/24 to hold Tac for that evening dose and the 7/25 AM dose, and beginning new 4mg /3mg  recommendation on Thurs evening before labs were drawn on 7/26 AM.     Since this TNC unable to attend patient's clinic appt today, contacted her to let her know that this TNC had to [facility] today. Confirmed that she had administered her lovenox at ~9am this morning. LMWH anti-Xa mistakenly drawn with am labs. Instructed her to have this lab drawn again after her visit with Dr.Desai today (~5 hrs after last dose), since her pentamidine had already been given. She verbalized understanding.    Per PharmD, patient's cellcept restarted at 250mg  bid, Tums stopped and acyclovir added to letermovir to cover HSV and CMV. Patient received pentamidine during this clinic visit. Per Dr.Desai, [place] to be repeated the week of 8/12, with plan to discontinue lovenox on 8/19, if stable. Per PharmD Chargualaf, LMWH stable today, so no need for further monitoring. Ordered US and sent notification to TPA to get scheduled.

## 2023-03-23 DIAGNOSIS — Z944 Liver transplant status: Principal | ICD-10-CM

## 2023-03-23 LAB — HIV RNA, QUANTITATIVE, PCR: HIV RNA QNT RSLT: NOT DETECTED

## 2023-03-23 LAB — HEPATITIS B DNA, QUANTITATIVE, PCR: HBV DNA QUANT: NOT DETECTED

## 2023-03-23 LAB — TRANSPLANT HEPATITIS C RNA, QUANTITATIVE, PCR: HCV RNA: NOT DETECTED

## 2023-03-23 LAB — TACROLIMUS LEVEL: TACROLIMUS BLOOD: 17.7 ng/mL

## 2023-03-23 MED ORDER — MYCOPHENOLATE MOFETIL 250 MG CAPSULE
ORAL_CAPSULE | Freq: Two times a day (BID) | ORAL | 3 refills | 90 days | Status: CP
Start: 2023-03-23 — End: 2024-03-22
  Filled 2023-03-22: qty 60, 30d supply, fill #0

## 2023-03-23 MED ORDER — ENOXAPARIN 40 MG/0.4 ML SUBCUTANEOUS SYRINGE
Freq: Two times a day (BID) | SUBCUTANEOUS | 0 refills | 14 days | Status: CP
Start: 2023-03-23 — End: ?

## 2023-03-23 MED ORDER — LETERMOVIR 480 MG TABLET
ORAL_TABLET | Freq: Every day | ORAL | 1 refills | 30 days | Status: CP
Start: 2023-03-23 — End: ?

## 2023-03-23 NOTE — Unmapped (Signed)
Discussed lab frequency with Dr. Celine Mans, who approved of [email or web address] labs twice weekly at this point. Contacted patient and relayed recommendation. She verbalized understanding and inquired about obtaining govt disability for her transplant status. Let her know that SW had planned to contact her about this and provided her phone number. Patient gets appt notifications via MyChart, but said she was aware of her upcoming [place] now scheduled for 8/15.

## 2023-03-23 NOTE — Unmapped (Signed)
Clinical Social Worker Telephone Note    Name:Alicia Armstrong  Date of Birth:11-10-1975  UJW:[id number]    REFERRAL INFORMATION:  [name] is post-transplant for liver transplantation . CSW follows up to assess financial questions/planning.    IMPRESSION:   Per request of TPA, CSW called daughter/[name] regarding disability. Explained process as patient was still unclear following inpatient discussion. [name] was very appreciative of information. [name] has contact information for CSW now.       Flint Melter, LCSW  Transplant Case Manager  03/23/2023. 4:22 PM

## 2023-03-23 NOTE — Unmapped (Signed)
Received voicemail from this patient @ 935 am stating that she has a question and requested that I give her a call back    Chart reviewed--> patient has a Transplant Nurse Coordinator Vernona Rieger) who was in touch with her yesterday    I will make Vernona Rieger aware of the call today    Ricard Dillon

## 2023-03-25 DIAGNOSIS — Z944 Liver transplant status: Principal | ICD-10-CM

## 2023-03-26 LAB — CBC W/ DIFFERENTIAL
BANDED NEUTROPHILS ABSOLUTE COUNT: 0 10*3/uL (ref 0.0–0.1)
BASOPHILS ABSOLUTE COUNT: 0 10*3/uL (ref 0.0–0.2)
BASOPHILS RELATIVE PERCENT: 1 %
EOSINOPHILS ABSOLUTE COUNT: 0.1 10*3/uL (ref 0.0–0.4)
EOSINOPHILS RELATIVE PERCENT: 2 %
HEMATOCRIT: 30.6 % — ABNORMAL LOW (ref 34.0–46.6)
HEMOGLOBIN: 9.6 g/dL — ABNORMAL LOW (ref 11.1–15.9)
IMMATURE GRANULOCYTES: 1 %
LYMPHOCYTES ABSOLUTE COUNT: 0.5 10*3/uL — ABNORMAL LOW (ref 0.7–3.1)
LYMPHOCYTES RELATIVE PERCENT: 18 %
MEAN CORPUSCULAR HEMOGLOBIN CONC: 31.4 g/dL — ABNORMAL LOW (ref 31.5–35.7)
MEAN CORPUSCULAR HEMOGLOBIN: 28.5 pg (ref 26.6–33.0)
MEAN CORPUSCULAR VOLUME: 91 fL (ref 79–97)
MONOCYTES ABSOLUTE COUNT: 0.3 10*3/uL (ref 0.1–0.9)
MONOCYTES RELATIVE PERCENT: 10 %
NEUTROPHILS ABSOLUTE COUNT: 1.8 10*3/uL (ref 1.4–7.0)
NEUTROPHILS RELATIVE PERCENT: 68 %
PLATELET COUNT: 107 10*3/uL — ABNORMAL LOW (ref 150–450)
RED BLOOD CELL COUNT: 3.37 x10E6/uL — ABNORMAL LOW (ref 3.77–5.28)
RED CELL DISTRIBUTION WIDTH: 15.7 % — ABNORMAL HIGH (ref 11.7–15.4)
WHITE BLOOD CELL COUNT: 2.7 10*3/uL — ABNORMAL LOW (ref 3.4–10.8)

## 2023-03-26 LAB — MAGNESIUM: MAGNESIUM: 2.1 mg/dL (ref 1.6–2.3)

## 2023-03-26 LAB — COMPREHENSIVE METABOLIC PANEL
ALBUMIN: 4.3 g/dL (ref 3.9–4.9)
ALKALINE PHOSPHATASE: 155 IU/L — ABNORMAL HIGH (ref 44–121)
ALT (SGPT): 29 IU/L (ref 0–32)
AST (SGOT): 29 IU/L (ref 0–40)
BILIRUBIN TOTAL (MG/DL) IN SER/PLAS: 0.4 mg/dL (ref 0.0–1.2)
BLOOD UREA NITROGEN: 15 mg/dL (ref 6–24)
BUN / CREAT RATIO: 18 (ref 9–23)
CALCIUM: 9.3 mg/dL (ref 8.7–10.2)
CHLORIDE: 102 mmol/L (ref 96–106)
CO2: 24 mmol/L (ref 20–29)
CREATININE: 0.84 mg/dL (ref 0.57–1.00)
EGFR: 87 mL/min/{1.73_m2}
GLOBULIN, TOTAL: 2.4 g/dL (ref 1.5–4.5)
GLUCOSE: 120 mg/dL — ABNORMAL HIGH (ref 70–99)
POTASSIUM: 5.2 mmol/L (ref 3.5–5.2)
SODIUM: 139 mmol/L (ref 134–144)
TOTAL PROTEIN: 6.7 g/dL (ref 6.0–8.5)

## 2023-03-26 LAB — BILIRUBIN, DIRECT: BILIRUBIN DIRECT: 0.2 mg/dL (ref 0.00–0.40)

## 2023-03-26 LAB — GAMMA GT: GAMMA GLUTAMYL TRANSFERASE: 98 IU/L — ABNORMAL HIGH (ref 0–60)

## 2023-03-26 LAB — PHOSPHORUS: PHOSPHORUS, SERUM: 4 mg/dL (ref 3.0–4.3)

## 2023-03-27 LAB — TACROLIMUS LEVEL: TACROLIMUS BLOOD: 10.1 ng/mL (ref 2.0–20.0)

## 2023-03-29 DIAGNOSIS — Z944 Liver transplant status: Principal | ICD-10-CM

## 2023-03-29 NOTE — Unmapped (Signed)
Patient left VM for TNC, whispering quietly about being at the lab. Contacted patient, who said she was at the lab this morning, but there was a delay in drawing her lab until 9:20, so she was a little later taking her IS med. Reassured her that that would be fine, but if she should ever be at the lab and need to urgently speak with a TNC about her orders, she should page. She verbalized understanding.

## 2023-03-30 DIAGNOSIS — Z944 Liver transplant status: Principal | ICD-10-CM

## 2023-03-30 LAB — COMPREHENSIVE METABOLIC PANEL
ALBUMIN: 4.3 g/dL (ref 3.9–4.9)
ALBUMIN: 4.1 g/dL (ref 3.4–5.0)
ALKALINE PHOSPHATASE: 372 IU/L — ABNORMAL HIGH (ref 44–121)
ALKALINE PHOSPHATASE: 419 U/L — ABNORMAL HIGH (ref 46–116)
ALT (SGPT): 263 IU/L — ABNORMAL HIGH (ref 0–32)
ALT (SGPT): 387 U/L — ABNORMAL HIGH (ref 10–49)
ANION GAP: 7 mmol/L (ref 5–14)
AST (SGOT): 183 IU/L — ABNORMAL HIGH (ref 0–40)
BILIRUBIN TOTAL (MG/DL) IN SER/PLAS: 0.6 mg/dL (ref 0.0–1.2)
BILIRUBIN TOTAL: 1.1 mg/dL (ref 0.3–1.2)
BLOOD UREA NITROGEN: 15 mg/dL (ref 6–24)
BLOOD UREA NITROGEN: 13 mg/dL (ref 9–23)
BUN / CREAT RATIO: 19 (ref 9–23)
BUN / CREAT RATIO: 21
CALCIUM: 9.3 mg/dL (ref 8.7–10.2)
CALCIUM: 9 mg/dL (ref 8.7–10.4)
CHLORIDE: 102 mmol/L (ref 96–106)
CHLORIDE: 101 mmol/L (ref 98–107)
CO2: 25 mmol/L (ref 20–29)
CO2: 26 mmol/L (ref 20.0–31.0)
CREATININE: 0.8 mg/dL (ref 0.57–1.00)
CREATININE: 0.63 mg/dL
EGFR CKD-EPI (2021) FEMALE: >90 mL/min/{1.73_m2} (ref >=60)
EGFR: 92 mL/min/{1.73_m2}
GLOBULIN, TOTAL: 2.3 g/dL (ref 1.5–4.5)
GLUCOSE RANDOM: 160 mg/dL (ref 70–179)
GLUCOSE: 125 mg/dL — ABNORMAL HIGH (ref 70–99)
POTASSIUM: 4.9 mmol/L (ref 3.5–5.2)
PROTEIN TOTAL: 7.5 g/dL (ref 5.7–8.2)
SODIUM: 140 mmol/L (ref 134–144)
SODIUM: 134 mmol/L — ABNORMAL LOW (ref 135–145)
TOTAL PROTEIN: 6.6 g/dL (ref 6.0–8.5)

## 2023-03-30 LAB — CBC W/ AUTO DIFF
BASOPHILS ABSOLUTE COUNT: 0 10*9/L (ref 0.0–0.1)
BASOPHILS RELATIVE PERCENT: 0.9 %
EOSINOPHILS ABSOLUTE COUNT: 0.1 10*9/L (ref 0.0–0.5)
EOSINOPHILS RELATIVE PERCENT: 3.1 %
HEMATOCRIT: 29.7 % — ABNORMAL LOW (ref 34.0–44.0)
HEMOGLOBIN: 9.9 g/dL — ABNORMAL LOW (ref 11.3–14.9)
LYMPHOCYTES ABSOLUTE COUNT: 0.3 10*9/L — ABNORMAL LOW (ref 1.1–3.6)
LYMPHOCYTES RELATIVE PERCENT: 17.4 %
MEAN CORPUSCULAR HEMOGLOBIN CONC: 33.5 g/dL (ref 32.0–36.0)
MEAN CORPUSCULAR HEMOGLOBIN: 29 pg (ref 25.9–32.4)
MEAN CORPUSCULAR VOLUME: 86.8 fL (ref 77.6–95.7)
MEAN PLATELET VOLUME: 8.1 fL (ref 6.8–10.7)
MONOCYTES ABSOLUTE COUNT: 0.2 10*9/L — ABNORMAL LOW (ref 0.3–0.8)
MONOCYTES RELATIVE PERCENT: 9.6 %
NEUTROPHILS ABSOLUTE COUNT: 1.3 10*9/L — ABNORMAL LOW (ref 1.8–7.8)
NEUTROPHILS RELATIVE PERCENT: 69 %
PLATELET COUNT: 101 10*9/L — ABNORMAL LOW (ref 150–450)
RED BLOOD CELL COUNT: 3.43 10*12/L — ABNORMAL LOW (ref 3.95–5.13)
RED CELL DISTRIBUTION WIDTH: 17.7 % — ABNORMAL HIGH (ref 12.2–15.2)
WBC ADJUSTED: 2 10*9/L — ABNORMAL LOW (ref 3.6–11.2)

## 2023-03-30 LAB — HEPATIC FUNCTION PANEL
ALBUMIN: 4.1 g/dL (ref 3.4–5.0)
ALKALINE PHOSPHATASE: 419 U/L — ABNORMAL HIGH (ref 46–116)
ALT (SGPT): 387 U/L — ABNORMAL HIGH (ref 10–49)
BILIRUBIN DIRECT: 0.6 mg/dL — ABNORMAL HIGH (ref 0.00–0.30)
BILIRUBIN TOTAL: 1.1 mg/dL (ref 0.3–1.2)
PROTEIN TOTAL: 7.5 g/dL (ref 5.7–8.2)

## 2023-03-30 LAB — APTT
APTT: 31.8 s (ref 24.8–38.4)
HEPARIN CORRELATION: 0.2

## 2023-03-30 LAB — CBC W/ DIFFERENTIAL
BANDED NEUTROPHILS ABSOLUTE COUNT: 0 10*3/uL (ref 0.0–0.1)
BASOPHILS ABSOLUTE COUNT: 0 10*3/uL (ref 0.0–0.2)
BASOPHILS RELATIVE PERCENT: 1 %
EOSINOPHILS ABSOLUTE COUNT: 0.1 10*3/uL (ref 0.0–0.4)
EOSINOPHILS RELATIVE PERCENT: 4 %
HEMATOCRIT: 32.3 % — ABNORMAL LOW (ref 34.0–46.6)
HEMOGLOBIN: 10 g/dL — ABNORMAL LOW (ref 11.1–15.9)
IMMATURE GRANULOCYTES: 1 %
LYMPHOCYTES ABSOLUTE COUNT: 0.4 10*3/uL — ABNORMAL LOW (ref 0.7–3.1)
LYMPHOCYTES RELATIVE PERCENT: 21 %
MEAN CORPUSCULAR HEMOGLOBIN CONC: 31 g/dL — ABNORMAL LOW (ref 31.5–35.7)
MEAN CORPUSCULAR HEMOGLOBIN: 27.8 pg (ref 26.6–33.0)
MEAN CORPUSCULAR VOLUME: 90 fL (ref 79–97)
MONOCYTES ABSOLUTE COUNT: 0.2 10*3/uL (ref 0.1–0.9)
MONOCYTES RELATIVE PERCENT: 10 %
NEUTROPHILS ABSOLUTE COUNT: 1.2 10*3/uL — ABNORMAL LOW (ref 1.4–7.0)
NEUTROPHILS RELATIVE PERCENT: 63 %
PLATELET COUNT: 107 10*3/uL — ABNORMAL LOW (ref 150–450)
RED BLOOD CELL COUNT: 3.6 x10E6/uL — ABNORMAL LOW (ref 3.77–5.28)
RED CELL DISTRIBUTION WIDTH: 15.2 % (ref 11.7–15.4)
WHITE BLOOD CELL COUNT: 1.9 10*3/uL — CL (ref 3.4–10.8)

## 2023-03-30 LAB — PROTIME-INR
INR: 1.07
PROTIME: 12 s (ref 9.9–12.6)

## 2023-03-30 LAB — BILIRUBIN, DIRECT: BILIRUBIN DIRECT: 0.28 mg/dL (ref 0.00–0.40)

## 2023-03-30 LAB — MAGNESIUM
MAGNESIUM: 2.1 mg/dL (ref 1.6–2.3)
MAGNESIUM: 1.8 mg/dL (ref 1.6–2.6)

## 2023-03-30 LAB — GAMMA GT: GAMMA GLUTAMYL TRANSFERASE: 284 IU/L — ABNORMAL HIGH (ref 0–60)

## 2023-03-30 LAB — PHOSPHORUS
PHOSPHORUS, SERUM: 3.9 mg/dL (ref 3.0–4.3)
PHOSPHORUS: 3.8 mg/dL (ref 2.4–5.1)

## 2023-03-30 NOTE — Unmapped (Signed)
Patient's 8/5 lab results with significant bump in LFTs but normal bilirubins. Spoke to patient, who reported that she felt weird and weak yesterday with bone pain to her LLE, but feels fine today. She denied any n/v/d/fever or chills and confirmed that she has been taking her IS as prescribed. Explained that this may require admission and let her know TNC would return call with interpreter.     Communicated with Dr.S.Kapoor, who recommended patient be admitted for [place] and possible liver Bx. Returned call to patient with assistance from [employer], [employer] [phone]). Relayed plan to patient. Patient became tearful. Reassured her that this is not an uncommon occurrence and does not mean her liver is failing, but potential problems could include rejection, stricturing of bile ducts or blood vessels or caused by a new medication or illness. Let her know that admitting would reach out to her when a room is available. Patient verbalized understanding of all discussed.

## 2023-03-31 ENCOUNTER — Encounter
Admit: 2023-03-31 | Discharge: 2023-04-07 | Disposition: A | Discharge status: Home / Self Care | Payer: PRIVATE HEALTH INSURANCE | Attending: Anesthesiology | Admitting: Student in an Organized Health Care Education/Training Program

## 2023-03-31 ENCOUNTER — Ambulatory Visit
Admit: 2023-03-31 | Discharge: 2023-04-07 | Disposition: A | Discharge status: Home / Self Care | Payer: PRIVATE HEALTH INSURANCE | Admitting: Student in an Organized Health Care Education/Training Program

## 2023-03-31 ENCOUNTER — Ambulatory Visit: Admit: 2023-03-31 | Discharge: 2023-04-07 | Payer: PRIVATE HEALTH INSURANCE

## 2023-03-31 ENCOUNTER — Ambulatory Visit: Admit: 2023-03-31 | Payer: PRIVATE HEALTH INSURANCE

## 2023-03-31 LAB — HEPATIC FUNCTION PANEL
ALBUMIN: 3.6 g/dL (ref 3.4–5.0)
ALKALINE PHOSPHATASE: 384 U/L — ABNORMAL HIGH (ref 46–116)
ALT (SGPT): 313 U/L — ABNORMAL HIGH (ref 10–49)
AST (SGOT): 143 U/L — ABNORMAL HIGH (ref <=34)
BILIRUBIN DIRECT: 0.6 mg/dL — ABNORMAL HIGH (ref 0.00–0.30)
BILIRUBIN TOTAL: 1 mg/dL (ref 0.3–1.2)
PROTEIN TOTAL: 6.6 g/dL (ref 5.7–8.2)

## 2023-03-31 LAB — BASIC METABOLIC PANEL
ANION GAP: 6 mmol/L (ref 5–14)
BLOOD UREA NITROGEN: 11 mg/dL (ref 9–23)
BUN / CREAT RATIO: 17
CALCIUM: 9.1 mg/dL (ref 8.7–10.4)
CHLORIDE: 106 mmol/L (ref 98–107)
CO2: 27 mmol/L (ref 20.0–31.0)
CREATININE: 0.64 mg/dL
EGFR CKD-EPI (2021) FEMALE: >90 mL/min/{1.73_m2} (ref >=60)
GLUCOSE RANDOM: 110 mg/dL (ref 70–179)
POTASSIUM: 4.6 mmol/L (ref 3.4–4.8)
SODIUM: 139 mmol/L (ref 135–145)

## 2023-03-31 LAB — CMV DNA, QUANTITATIVE, PCR: CMV VIRAL LD: NOT DETECTED

## 2023-03-31 LAB — CBC
HEMATOCRIT: 30.5 % — ABNORMAL LOW (ref 34.0–44.0)
HEMOGLOBIN: 10 g/dL — ABNORMAL LOW (ref 11.3–14.9)
MEAN CORPUSCULAR HEMOGLOBIN CONC: 32.9 g/dL (ref 32.0–36.0)
MEAN CORPUSCULAR HEMOGLOBIN: 28.7 pg (ref 25.9–32.4)
MEAN CORPUSCULAR VOLUME: 87.2 fL (ref 77.6–95.7)
MEAN PLATELET VOLUME: 7.9 fL (ref 6.8–10.7)
PLATELET COUNT: 89 10*9/L — ABNORMAL LOW (ref 150–450)
RED BLOOD CELL COUNT: 3.49 10*12/L — ABNORMAL LOW (ref 3.95–5.13)
RED CELL DISTRIBUTION WIDTH: 17.8 % — ABNORMAL HIGH (ref 12.2–15.2)
WBC ADJUSTED: 2.4 10*9/L — ABNORMAL LOW (ref 3.6–11.2)

## 2023-03-31 LAB — AST: AST (SGOT): 152 U/L — ABNORMAL HIGH (ref <=34)

## 2023-03-31 LAB — TACROLIMUS LEVEL: TACROLIMUS BLOOD: 9.8 ng/mL (ref 2.0–20.0)

## 2023-03-31 LAB — MAGNESIUM: MAGNESIUM: 1.7 mg/dL (ref 1.6–2.6)

## 2023-03-31 LAB — POTASSIUM: POTASSIUM: 4.3 mmol/L (ref 3.4–4.8)

## 2023-03-31 LAB — GAMMA GT: GAMMA GLUTAMYL TRANSFERASE: 394 U/L — ABNORMAL HIGH

## 2023-03-31 LAB — PHOSPHORUS: PHOSPHORUS: 3.7 mg/dL (ref 2.4–5.1)

## 2023-03-31 LAB — HEPATITIS B DNA, QUANTITATIVE, PCR: HBV DNA QUANT: NOT DETECTED

## 2023-03-31 LAB — EBV QUANTITATIVE PCR, BLOOD: EBV VIRAL LOAD RESULT: NOT DETECTED

## 2023-03-31 MED ADMIN — methocarbamol (ROBAXIN) tablet 500 mg: 500 mg | ORAL | @ 04:00:00

## 2023-03-31 MED ADMIN — magnesium oxide-Mg AA chelate (Magnesium Plus Protein) 1 tablet: 1 | ORAL | @ 04:00:00

## 2023-03-31 MED ADMIN — letermovir (PREVYMIS) tablet 480 mg: 480 mg | ORAL | @ 18:00:00

## 2023-03-31 MED ADMIN — methocarbamol (ROBAXIN) tablet 500 mg: 500 mg | ORAL | @ 22:00:00

## 2023-03-31 MED ADMIN — gabapentin (NEURONTIN) capsule 400 mg: 400 mg | ORAL | @ 18:00:00

## 2023-03-31 MED ADMIN — mycophenolate (CELLCEPT) capsule 250 mg: 250 mg | ORAL | @ 18:00:00

## 2023-03-31 MED ADMIN — sodium chloride (NS) 0.9 % infusion: 75 mL/h | INTRAVENOUS | @ 04:00:00

## 2023-03-31 MED ADMIN — tacrolimus (PROGRAF) capsule 4 mg: 4 mg | ORAL | @ 18:00:00

## 2023-03-31 MED ADMIN — acyclovir (ZOVIRAX) capsule 400 mg: 400 mg | ORAL | @ 04:00:00

## 2023-03-31 MED ADMIN — gabapentin (NEURONTIN) capsule 400 mg: 400 mg | ORAL | @ 04:00:00

## 2023-03-31 MED ADMIN — acyclovir (ZOVIRAX) capsule 400 mg: 400 mg | ORAL | @ 18:00:00

## 2023-03-31 MED ADMIN — mycophenolate (CELLCEPT) capsule 250 mg: 250 mg | ORAL | @ 04:00:00

## 2023-03-31 MED ADMIN — pantoprazole (Protonix) EC tablet 40 mg: 40 mg | ORAL | @ 18:00:00

## 2023-03-31 MED ADMIN — magnesium oxide-Mg AA chelate (Magnesium Plus Protein) 1 tablet: 1 | ORAL | @ 18:00:00

## 2023-03-31 MED ADMIN — methocarbamol (ROBAXIN) tablet 500 mg: 500 mg | ORAL | @ 18:00:00

## 2023-03-31 NOTE — Unmapped (Signed)
Alicia Armstrong presents to [facility] for elevated liver enzymes s/p OLT 6/24.  A&Ox4, afebrile, blood pressures slightly elevated but stable at present.  NPO at 0025 after med pass.   Spouse at bedside.      Problem: Adult Inpatient Plan of Care  Goal: Absence of Hospital-Acquired Illness or Injury  Intervention: Identify and Manage Fall Risk  Recent Flowsheet Documentation  Taken 03/31/2023 0400 by Ennis Forts, RN  Safety Interventions: family at bedside  Taken 03/31/2023 0200 by Ennis Forts, RN  Safety Interventions:   family at bedside   fall reduction program maintained   low bed  Taken 03/31/2023 0000 by Ennis Forts, RN  Safety Interventions:   family at bedside   fall reduction program maintained   low bed  Taken 03/30/2023 2200 by Ennis Forts, RN  Safety Interventions:   fall reduction program maintained   lighting adjusted for tasks/safety   low bed   nonskid shoes/slippers when out of bed  Intervention: Prevent Infection  Recent Flowsheet Documentation  Taken 03/30/2023 2200 by Ennis Forts, RN  Infection Prevention:   single patient room provided   rest/sleep promoted   hand hygiene promoted     Problem: Fall Injury Risk  Goal: Absence of Fall and Fall-Related Injury  Intervention: Promote Injury-Free Environment  Recent Flowsheet Documentation  Taken 03/31/2023 0400 by Ennis Forts, RN  Safety Interventions: family at bedside  Taken 03/31/2023 0200 by Ennis Forts, RN  Safety Interventions:   family at bedside   fall reduction program maintained   low bed  Taken 03/31/2023 0000 by Ennis Forts, RN  Safety Interventions:   family at bedside   fall reduction program maintained   low bed  Taken 03/30/2023 2200 by Ennis Forts, RN  Safety Interventions:   fall reduction program maintained   lighting adjusted for tasks/safety   low bed   nonskid shoes/slippers when out of bed

## 2023-03-31 NOTE — Unmapped (Signed)
Pt is alert and oriented, denies any c/o pain. VSS. Pt scheduled to have ultra sound guided liver biopsy to assess for liver transplant rejection tomorrow, consent signed. Pt and husband agrees with POC for biopsy. Pt to be NPO after midnight.   Problem: Adult Inpatient Plan of Care  Goal: Plan of Care Review  Outcome: Ongoing - Unchanged  Flowsheets (Taken 03/31/2023 1645)  Progress: no change  Plan of Care Reviewed With:   patient   spouse  Goal: Patient-Specific Goal (Individualized)  Outcome: Ongoing - Unchanged  Goal: Absence of Hospital-Acquired Illness or Injury  Outcome: Ongoing - Unchanged  Intervention: Identify and Manage Fall Risk  Recent Flowsheet Documentation  Taken 03/31/2023 1600 by Merlene Pulling, Alphonse Guild, RN  Safety Interventions:   family at bedside   lighting adjusted for tasks/safety   low bed   nonskid shoes/slippers when out of bed  Taken 03/31/2023 1400 by Merlene Pulling, Alphonse Guild, RN  Safety Interventions:   family at bedside   lighting adjusted for tasks/safety   low bed   nonskid shoes/slippers when out of bed  Taken 03/31/2023 1200 by Merlene Pulling, Alphonse Guild, RN  Safety Interventions:   family at bedside   lighting adjusted for tasks/safety   low bed   nonskid shoes/slippers when out of bed  Taken 03/31/2023 1000 by Merlene Pulling, Alphonse Guild, RN  Safety Interventions:   family at bedside   lighting adjusted for tasks/safety   low bed   nonskid shoes/slippers when out of bed  Taken 03/31/2023 0800 by Merlene Pulling, Alphonse Guild, RN  Safety Interventions:   family at bedside   lighting adjusted for tasks/safety   low bed   nonskid shoes/slippers when out of bed  Intervention: Prevent Skin Injury  Recent Flowsheet Documentation  Taken 03/31/2023 1600 by Ardelle Lesches, RN  Positioning for Skin: Supine/Back  Taken 03/31/2023 1400 by Ardelle Lesches, RN  Positioning for Skin: Supine/Back  Taken 03/31/2023 1200 by Ardelle Lesches, RN  Positioning for Skin: Supine/Back  Taken 03/31/2023 1000 by Ardelle Lesches, RN  Positioning for Skin: Supine/Back  Taken 03/31/2023 0800 by Ardelle Lesches, RN  Positioning for Skin: Supine/Back  Taken 03/31/2023 0745 by Merlene Pulling, Alphonse Guild, RN  Positioning for Skin: Supine/Back  Goal: Optimal Comfort and Wellbeing  Outcome: Ongoing - Unchanged  Goal: Readiness for Transition of Care  Outcome: Ongoing - Unchanged  Goal: Rounds/Family Conference  Outcome: Ongoing - Unchanged     Problem: Fall Injury Risk  Goal: Absence of Fall and Fall-Related Injury  Outcome: Ongoing - Unchanged  Intervention: Promote Injury-Free Environment  Recent Flowsheet Documentation  Taken 03/31/2023 1600 by Merlene Pulling, Alphonse Guild, RN  Safety Interventions:   family at bedside   lighting adjusted for tasks/safety   low bed   nonskid shoes/slippers when out of bed  Taken 03/31/2023 1400 by Merlene Pulling, Alphonse Guild, RN  Safety Interventions:   family at bedside   lighting adjusted for tasks/safety   low bed   nonskid shoes/slippers when out of bed  Taken 03/31/2023 1200 by Merlene Pulling, Alphonse Guild, RN  Safety Interventions:   family at bedside   lighting adjusted for tasks/safety   low bed   nonskid shoes/slippers when out of bed  Taken 03/31/2023 1000 by Merlene Pulling, Alphonse Guild, RN  Safety Interventions:   family at bedside   lighting adjusted for tasks/safety   low bed   nonskid shoes/slippers when out of bed  Taken 03/31/2023 0800 by Ardelle Lesches, RN  Safety Interventions:  family at bedside   lighting adjusted for tasks/safety   low bed   nonskid shoes/slippers when out of bed     Problem: Wound  Goal: Optimal Coping  Outcome: Ongoing - Unchanged  Goal: Optimal Functional Ability  Outcome: Ongoing - Unchanged  Goal: Absence of Infection Signs and Symptoms  Outcome: Ongoing - Unchanged  Goal: Improved Oral Intake  Outcome: Ongoing - Unchanged  Goal: Optimal Pain Control and Function  Outcome: Ongoing - Unchanged  Goal: Skin Health and Integrity  Outcome: Ongoing - Unchanged  Intervention: Optimize Skin Protection  Recent Flowsheet Documentation  Taken 03/31/2023 1600 by Merlene Pulling, Alphonse Guild, RN  Pressure Reduction Techniques: frequent weight shift encouraged  Pressure Reduction Devices: pressure-redistributing mattress utilized  Taken 03/31/2023 1400 by Merlene Pulling, Alphonse Guild, RN  Pressure Reduction Techniques: frequent weight shift encouraged  Pressure Reduction Devices: pressure-redistributing mattress utilized  Taken 03/31/2023 1200 by Merlene Pulling, Alphonse Guild, RN  Pressure Reduction Techniques: frequent weight shift encouraged  Pressure Reduction Devices: pressure-redistributing mattress utilized  Taken 03/31/2023 1000 by Merlene Pulling, Alphonse Guild, RN  Pressure Reduction Techniques: frequent weight shift encouraged  Pressure Reduction Devices: pressure-redistributing mattress utilized  Taken 03/31/2023 0800 by Merlene Pulling, Alphonse Guild, RN  Pressure Reduction Techniques: frequent weight shift encouraged  Pressure Reduction Devices: pressure-redistributing mattress utilized  Taken 03/31/2023 0745 by Merlene Pulling, Alphonse Guild, RN  Pressure Reduction Techniques: frequent weight shift encouraged  Pressure Reduction Devices: pressure-redistributing mattress utilized  Goal: Optimal Wound Healing  Outcome: Ongoing - Unchanged

## 2023-03-31 NOTE — Unmapped (Signed)
Transplant Surgery History and Physical    Attending Physician:  Gemma Payor, MD  Inpatient Service:  Surg Transplant (SRF)  Date: 03/30/2023    Assessment :  Alicia Armstrong is a 47 y.o. 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 Gemma Payor, MD. the patient is currently stable.    Plan:  -Admit SRF, floor  -Home medications ordered  -Liver transplant ultrasound with Doppler  -CBC, BMP, mag, Phos, hepatic function panel, PT-INR, APTT  -Potential for liver biopsy tomorrow  -N.p.o. at midnight  -Maintenance fluids ordered    History of Present Illness:   Alicia Armstrong is a 47 y.o. 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 She was re-admitted 7/6-7/9 for back pain which has since resolved. 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 Her LFTs have conitnued to be normal. She was recently seen in clinic and at that time reported doing well with no acute concerns.  She felt like she had more energy and she was starting to help out around the house more.  She had labs completed yesterday which demonstrated elevated LFTs and was instructed to present to the hospital for workup of her elevated LFTs.    Allergies  Allergies   Allergen Reactions    Penicillins Other (See Comments)     Had a rash with penicillin when she was 47 years old. She tolerated piperacillin/tazobactam 08/25/2016-08/28/2016 with itching without rash.     Medications    No current facility-administered medications on file prior to encounter.     Current Outpatient Medications on File Prior to Encounter   Medication Sig Dispense Refill    acetaminophen (TYLENOL) 325 MG tablet Take 1-2 tablets (325-650 mg total) by mouth every eight (8) hours as needed for pain or fever. 60 tablet 11    acyclovir (ZOVIRAX) 400 MG tablet Take 1 tablet (400 mg total) by mouth two (2) times a day. 60 tablet 1    aspirin (ECOTRIN) 81 MG tablet Take 1 tablet (81 mg total) by mouth daily. 90 tablet 3    docusate sodium (COLACE) 100 MG capsule Take 1 capsule (100 mg total) by mouth two (2) times a day as needed for constipation. 60 capsule 11    enoxaparin (LOVENOX) 40 mg/0.4 mL Syrg Inject 0.4 mL (40 mg total) under the skin every twelve (12) hours. 11.2 mL 0    gabapentin (NEURONTIN) 400 MG capsule Take 1 capsule (400 mg total) by mouth Three (3) times a day. 90 capsule 3    insulin lispro (HUMALOG) 100 unit/mL injection pen Inject 1 Units under the skin Three (3) times a day before meals.      lancets Misc Use to check blood sugar as directed with insulin 3 times a day & for symptoms of high or low blood sugar. 100 each 0    letermovir (PREVYMIS) 480 mg tablet Take 1 tablet (480 mg total) by mouth daily. 28 tablet 1    lidocaine 4 % patch Place 1 patch on skin around the incision for 12 hours nightly, then remove for 12 hours. Do not place on open skin or the incision directly. 39 patch 0    magnesium oxide-Mg AA chelate (MAGNESIUM, AMINO ACID CHELATE,) 133 mg Take 1 tablet by mouth two (2)  times a day. 100 tablet 6    methocarbamol (ROBAXIN) 500 MG tablet Take 1 tablet (500 mg total) by mouth four (4) times a day. 120 tablet 1    mycophenolate (CELLCEPT) 250 mg capsule Take 1 capsule (250 mg total) by mouth two (2) times a day. 180 capsule 3    pantoprazole (PROTONIX) 40 MG tablet Take 1 tablet (40 mg total) by mouth daily. 30 tablet 11    pen needle, diabetic 32 gauge x 5/32 (4 mm) Ndle Use with insulin up to 4 times/day as needed. 100 each 0    pentamidine in sterile water Inhale 6 mL (300 mg total) every twenty-eight (28) days.      polyethylene glycol (MIRALAX) 17 gram packet Mix 1 packet in  4 to 8 ounces of liquid and drink daily as needed. 30 packet 11    predniSONE (DELTASONE) 5 MG tablet Take 1 tablet (5 mg total) by mouth daily. 90 tablet 3 SITagliptin phosphate (JANUVIA) 100 MG tablet Take 1 tablet (100 mg total) by mouth daily. 90 tablet 3    tacrolimus (PROGRAF) 1 MG capsule Take 4 capsules (4 mg total) by mouth daily AND 3 capsules (3 mg total) nightly. 210 capsule 11     Past Medical History  Past Medical History:   Diagnosis Date    Cirrhosis (CMS-HCC)      Past Surgical History  Past Surgical History:   Procedure Laterality Date    CESAREAN SECTION      PR COLSC FLX W/RMVL OF TUMOR POLYP LESION SNARE TQ N/A 09/11/2022    Procedure: COLONOSCOPY FLEX; W/REMOV TUMOR/LES BY SNARE;  Surgeon: Mickie Hillier, MD;  [facility];  Service: Gastroenterology    PR REMV VEIN CLOT CAVA-ILIAC,LEG INCIS N/A 02/14/2023    Procedure: THROMBEC DIRECT OR W/CATH; VENA CAVA BY LEG INCS;  Surgeon: Florene Glen, MD;  Location: MAIN OR [place];  Service: Transplant    PR TRANSPLANT LIVER,ALLOTRANSPLANT Bilateral 02/14/2023    Procedure: LIVER ALLOTRANSPLANTATION; ORTHOTOPIC, PARTIAL OR WHOLE, FROM CADAVER OR LIVING DONOR, ANY AGE;  Surgeon: Florene Glen, MD;  Location: MAIN OR [place];  Service: Transplant    PR TRANSPLANT,PREP DONOR LIVER, WHOLE N/A 02/14/2023    Procedure: [facility] STD PREP CAD DONOR WHOLE LIVER GFT PRIOR TNSPLNT,INC CHOLE,DISS/REM SURR TISSU WO TRISEG/LOBE SPLT;  Surgeon: Florene Glen, MD;  Location: MAIN OR [place];  Service: Transplant    PR UPPER GI ENDOSCOPY,DIAGNOSIS N/A 04/02/2017    Procedure: UGI ENDO, INCLUDE ESOPHAGUS, STOMACH, & DUODENUM &/OR JEJUNUM; DX W/WO COLLECTION SPECIMN, BY BRUSH OR WASH;  Surgeon: Janyth Pupa, MD;  Location: [facility];  Service: Gastroenterology    PR UPPER GI ENDOSCOPY,DIAGNOSIS N/A 09/11/2022    Procedure: UGI ENDO, INCLUDE ESOPHAGUS, STOMACH, & DUODENUM &/OR JEJUNUM; DX W/WO COLLECTION SPECIMN, BY BRUSH OR WASH;  Surgeon: Mickie Hillier, MD;  [facility]: [facility];  Service: Gastroenterology     Family History  Family History   Problem Relation Age of Onset    No Known Problems Mother     Diabetes Father     No Known Problems Sister     No Known Problems Daughter     No Known Problems Maternal Grandmother     No Known Problems Maternal Grandfather     No Known Problems Paternal Grandmother     No Known Problems Paternal Grandfather     BRCA 1/2 Neg Hx     Breast cancer Neg  Hx     Cancer Neg Hx     Colon cancer Neg Hx     Endometrial cancer Neg Hx     Ovarian cancer Neg Hx      Social History:  Social History     Tobacco Use    Smoking status: Never    Smokeless tobacco: Never   Vaping Use    Vaping status: Never Used   Substance Use Topics    Alcohol use: No     Alcohol/week: 0.0 standard drinks of alcohol    Drug use: No     Review of Systems  A 12 system review of systems was negative except as noted in HPI.      Vital Signs    Patient Vitals for the past 8 hrs:   BP Temp Pulse Resp SpO2   03/30/23 2156 143/84 36.9 ??C (98.4 ??F) 70 16 100 %     Physical Exam  General Appearance: Female in no acute distress, sitting comfortably in bed.  Alert and oriented x 4  Head: Normocephalic, atraumatic  Eyes: Conjunctiva and lids appear normal.  Pupils equal round and reactive to light.  Sclera anicteric  Nose: Nares grossly normal, no drainage.  Pulmonary: Normal work of breathing on room air  Cardiovascular: Normal rate  Abdomen: Soft, non-tender, without masses.  Incision C/D/I.  Musculoskeletal: Extremities without clubbing, cyanosis or edema.  Neurologic:  No motor abnormalities noted. Sensation grossly intact.  Skin:  Skin color normal. No rashes or lesions. No Jaundice  Psychiatric: Judgement and insight seem appropriate.    Labs and Studies  Labs:  Recent Results (from the past 24 hour(s))   POCT Glucose    Collection Time: 03/30/23 10:39 PM   Result Value Ref Range    Glucose, POC 165 70 - 179 mg/dL   Protime-INR    Collection Time: 03/30/23 10:43 PM   Result Value Ref Range    PT 12.0 9.9 - 12.6 sec    INR 1.07 APTT    Collection Time: 03/30/23 10:43 PM   Result Value Ref Range    APTT 31.8 24.8 - 38.4 sec    Heparin Correlation 0.2    CBC w/ Differential    Collection Time: 03/30/23 10:43 PM   Result Value Ref Range    WBC 2.0 (L) 3.6 - 11.2 10*9/L    RBC 3.43 (L) 3.95 - 5.13 10*12/L    HGB 9.9 (L) 11.3 - 14.9 g/dL    HCT 09.8 (L) 11.9 - 44.0 %    MCV 86.8 77.6 - 95.7 fL    MCH 29.0 25.9 - 32.4 pg    MCHC 33.5 32.0 - 36.0 g/dL    RDW 14.7 (H) 82.9 - 15.2 %    MPV 8.1 6.8 - 10.7 fL    Platelet 101 (L) 150 - 450 10*9/L    Neutrophils % 69.0 %    Lymphocytes % 17.4 %    Monocytes % 9.6 %    Eosinophils % 3.1 %    Basophils % 0.9 %    Absolute Neutrophils 1.3 (L) 1.8 - 7.8 10*9/L    Absolute Lymphocytes 0.3 (L) 1.1 - 3.6 10*9/L    Absolute Monocytes 0.2 (L) 0.3 - 0.8 10*9/L    Absolute Eosinophils 0.1 0.0 - 0.5 10*9/L    Absolute Basophils 0.0 0.0 - 0.1 10*9/L    Anisocytosis Slight (A) Not Present     Sharol Given, MD, DMD  General Surgery -  PGY-1  Pager: [phone]    This document was completed utilizing Dragon voice recognition software. Grammatical errors, random word insertions, pronoun errors, and incomplete sentences are an occasional consequence of this system due to software limitations, ambient noise, and hardware issues. Any formal questions or concerns about the content, text or information contained within the body of this dictation should be directly addressed to the provider for clarification.

## 2023-03-31 NOTE — Unmapped (Signed)
Enoxaparin Therapeutic Monitoring Pharmacy Note    Alicia Armstrong is a 47 y.o. female continuing enoxaparin.    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      Prior Dosing Information: Home regimen 40 mg BID    Goals:  Therapeutic Drug Levels  LMWH Anti-Xa level: 0.2-0.4 units/mL drawn 4-6 hours post-dose    Additional Clinical Monitoring/Outcomes  Monitor hemoglobin and platelets  Monitor for signs/symptoms of bleeding  Monitor renal function     Results:  Not applicable  Wt Readings from Last 3 Encounters:   03/22/23 63.6 kg (140 lb 4.8 oz)   03/22/23 63.6 kg (140 lb 4.8 oz)   03/10/23 74.2 kg (163 lb 9.3 oz)     HGB   Date Value Ref Range Status   03/30/2023 9.9 (L) 11.3 - 14.9 g/dL Final     Platelet   Date Value Ref Range Status   03/30/2023 101 (L) 150 - 450 10*9/L Final     Creatinine   Date Value Ref Range Status   03/30/2023 0.63 0.55 - 1.02 mg/dL Final       Pharmacokinetic Considerations and Significant Drug Interactions:   Concurrent antiplatelet medications: aspirin    Assessment/Plan:   Recommendation(s)  Continue current regimen of 40 mg BID    Follow-up  Level due: No levels indicated at this time  A pharmacist will continue to monitor and recommend levels as appropriate      Please page service pharmacist with questions/clarifications.    Hollie Beach, RPH, PharmD

## 2023-03-31 NOTE — Unmapped (Signed)
Transplant Surgery Progress Note    Hospital Day: 2    Assessment:     Alicia Armstrong is a 47 y.o. 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 Gemma Payor, MD.       Interval Events:     No acute events overnight. Pain is well controlled.  Vital signs are stable.   Consulted VIR for transjugular liver bx; ordered CMV, repeat HBV (+HbsAG 6/24). Plan to repeat anti-Xa later this week.     Plan:     Neuro:   - Pain well controlled   - Gaba, robaxin    CV:   - HDS, Maintain SBP < 180    Pulm:   - Stable on room air  - OOB    GI:   - F: 0.9% NS at 91mL/hr   - E: Replete as needed   - N: NPO w/ sips for meds, regular diet after VIR   - pepcid/pantoprazole, zofran, colace, miralax    GU:   - no foley   - 8/6 liver US, interval increase in RIs, narrowing of PV anastomosis   - 8/7 VIR liver biopsy - PENDING    Endo:   -glucose checks ACHS    -SSI    Heme/ID:   - Afebrile, WBC and Hgb stable   - ppx with acyclovir, letermovir   - 8/7 CMV ordered, PENDING   - 8/7 HBsAG ordered, PENDING    Immuno:   - tac, cellcept, prednisone  - pharmacy recommendations for dosing      Dispo   - Floor      Objective:        Vital Signs:  BP 141/79  - Pulse 65  - Temp 36.8 ??C (98.2 ??F) (Oral)  - Resp 19  - Ht 140.3 cm (4' 7.24)  - Wt 63.6 kg (140 lb 4.8 oz)  - SpO2 100%  - BMI 32.33 kg/m??     Input/Output:  No intake/output data recorded.    Physical Exam:    General: Cooperative, no distress, well appearing female  Pulmonary: Normal work of breathing, on room air  Cardiovascular: Regular rate,  Abdomen: Soft, non-tender, non-distended.   Musculoskeletal: Moves extremities spontaneously   Neurologic: Alert and interactive, grossly intact    Labs:  Lab Results   Component Value Date    WBC 2.4 (L) 03/31/2023    HGB 10.0 (L) 03/31/2023    HCT 30.5 (L) 03/31/2023    PLT 89 (L) 03/31/2023 Lab Results   Component Value Date    NA 139 03/31/2023    K 4.6 03/31/2023    CL 106 03/31/2023    CO2 27.0 03/31/2023    BUN 11 03/31/2023    CREATININE 0.64 03/31/2023    CALCIUM 9.1 03/31/2023    MG 1.7 03/31/2023    PHOS 3.7 03/31/2023       Microbiology Results (last day)       ** No results found for the last 24 hours. **            Imaging:  All pertinent imaging personally reviewed.  8/6 Liver doppler, impression    --Patent hepatic transplant vasculature. Increased resistive indices compared to prior, which are now near or above upper  normal. Additionally, focal area of vascular narrowing distal to the main portal vein anastomosis with  increased velocities in the anterior right and left portal veins.  --Complex appearing collection in the gallbladder fossa, indeterminate may reflect loculated ascites versus cystic duct remnant.  --Increased liver echogenicity, nonspecific.  --Mild hydronephrosis bilaterally. This improves slightly post void.

## 2023-03-31 NOTE — Unmapped (Signed)
Tacrolimus Therapeutic Monitoring Pharmacy Note    Alicia Armstrong is a 47 y.o. female continuing tacrolimus.     Indication: Liver transplant     Date of Transplant:  02/15/2023       Prior Dosing Information: Current regimen 4 mg Daily / 3 mg Nightly      Source(s) of information used to determine prior to admission dosing: Home Medication List    Goals:  Therapeutic Drug Levels  Tacrolimus trough goal: 8-10 ng/mL    Additional Clinical Monitoring/Outcomes  Monitor renal function (SCr and urine output) and liver function (LFTs)  Monitor for signs/symptoms of adverse events (e.g., hyperglycemia, hyperkalemia, hypomagnesemia, hypertension, headache, tremor)    Results:   Tacrolimus level: Not applicable    Pharmacokinetic Considerations and Significant Drug Interactions:  Concurrent hepatotoxic medications: None identified  Concurrent CYP3A4 substrates/inhibitors: None identified  Concurrent nephrotoxic medications: None identified    Assessment/Plan:  Recommendedation(s)  Current regimen 4 mg Daily / 3 mg Nightly     Follow-up  Next level to be determined by primary team.   A pharmacist will continue to monitor and recommend levels as appropriate    Please page service pharmacist with questions/clarifications.    Hollie Beach, RPH

## 2023-03-31 NOTE — Unmapped (Signed)
[place] [facility] - Liver Biopsy Consultation Note    Subjective:       Reason for Liver Biopsy:  2 OLT concerns for rejection    Requested approach: 2 Transjugular , if transjugular approach With pressures.    Requesting team:  Transplant    HPI:   Boutin is a 47 y.o. 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   Patient seen in consultation at the request of primary care team for consideration for transcatheter liver biopsy with pressure measurements. Patient is afebrile, VSS; labs appropriate.       Objective:      Imaging Studies:  US liver transplant 03/31/23    Pertinent Laboratory Values:  WBC   Date Value Ref Range Status   03/31/2023 2.4 (L) 3.6 - 11.2 10*9/L Final   03/29/2023 1.9 (LL) 3.4 - 10.8 x10E3/uL Final     HGB   Date Value Ref Range Status   03/31/2023 10.0 (L) 11.3 - 14.9 g/dL Final   96/29/5284 13.2 (L) 11.1 - 15.9 g/dL Final     Hemoglobin   Date Value Ref Range Status   09/07/2022 12.7 12.0 - 16.0 g/dL Final     HCT   Date Value Ref Range Status   03/31/2023 30.5 (L) 34.0 - 44.0 % Final   03/29/2023 32.3 (L) 34.0 - 46.6 % Final     Platelet   Date Value Ref Range Status   03/31/2023 89 (L) 150 - 450 10*9/L Final   03/29/2023 107 (L) 150 - 450 x10E3/uL Final     INR   Date Value Ref Range Status   03/30/2023 1.07  Final   02/10/2023 1.9 (H) 0.9 - 1.2 Final     Comment:     Reference interval is for non-anticoagulated patients.  Suggested INR therapeutic range for Vitamin K  antagonist therapy:     Standard Dose (moderate intensity                    therapeutic range):       2.0 - 3.0     Higher intensity therapeutic range       2.5 - 3.5       Creatinine   Date Value Ref Range Status   03/31/2023 0.64 0.55 - 1.02 mg/dL Final   44/08/270 5.36 0.57 - 1.00 mg/dL Final     Total Bilirubin   Date Value Ref Range Status   03/31/2023 1.0 0.3 - 1.2 mg/dL Final   64/40/3474 0.6 0.0 - 1.2 mg/dL Final     LDH   Date Value Ref Range Status   03/22/2023 202 120 - 246 U/L Final     AST   Date Value Ref Range Status   03/31/2023 143 (H) <=34 U/L Final   03/29/2023 183 (H) 0 - 40 IU/L Final     ALT   Date Value Ref Range Status   03/31/2023 313 (H) 10 - 49 U/L Final   03/29/2023 263 (H) 0 - 32 IU/L Final       Allergies:     Allergies   Allergen Reactions    Penicillins Other (See Comments)     Had a rash with penicillin when she was 47 years old. She tolerated piperacillin/tazobactam 08/25/2016-08/28/2016 with itching without rash.       Physical Exam:  Vitals:    03/31/23 0805   BP: 141/79   Pulse: 65   Resp: 19   Temp: 36.8 ??C (98.2 ??F)   SpO2: 100%       General: No apparent distress.  Lungs: Breathing comfortably on room air.  Heart:  Regular rate and rhythm.  Neuro: No obvious focal deficits.    ASA Grade: ASA 2 - Patient with mild systemic disease with no functional limitations  Airway assessment: Class 1 - Can visualize soft palate, fauces, uvula, and tonsillar pillars    Assessment:     Ms. Francisco is a 47 y.o. 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   Patient seen in consultation at the request of primary care team for consideration for transcatheter liver biopsy with pressures.   Plan/Recommendations:       -VIR recommends proceeding with liver biopsy via 2 Transjugular  approach with pressure measurements.    -Anticipated procedure date: 8/8  -Anticoagulation: No  -Please make NPO night prior to procedure  -Recent labs appropriate        Informed Consent obtained with Spanish interpreter:  This procedure and sedation has been fully reviewed with the patient/patient???s authorized representative. The risks, benefits and alternatives including but not limited to bleeding, infection, damage to adjacent structures, and/or need for groin access approach have been explained, and the patient/patient???s authorized representative has consented to the procedure. Consent obtained by Anice Paganini, PA, witnessed and scanned into patient's medical record.   --The patient will accept blood products in an emergent situation.  --The patient does not have a Do Not Resuscitate order in effect.      The patient was discussed with Dr. Rosalio Loud .     Thank you for involving [place] in the care of this patient. Please page the VIR consult pager [phone]) with further questions, concerns, or if new issues arise.      Anice Paganini, PA, March 31, 2023, 1:31 PM

## 2023-03-31 NOTE — Unmapped (Addendum)
Alicia Armstrong is a 15F history of AIH/MASH cirrhosis s/p OLT 02/15/2023 admitted with abnormal LFTs with predominantly hepatocellular pattern of injury. Vessels patent by doppler. Pending transjugular liver biopsy    03/30/23 liver ultrasound showed relatively increased resistive indices and a focal narrowing distal to portal vein.   04/02/23 VIR consulted for transjugular hepatic biopsy with pressure  03/31/23 CMV and HBV negative    Developed cholestatic pattern.   8/11 MRCP - CBD dilation   8/11 started zosyn for empiric cholangitis treatment****    [ ]  8/13 ERCP scheduled, NPO at midnight

## 2023-04-01 DIAGNOSIS — Z944 Liver transplant status: Principal | ICD-10-CM

## 2023-04-01 LAB — CBC
HEMATOCRIT: 30.8 % — ABNORMAL LOW (ref 34.0–44.0)
HEMOGLOBIN: 10.2 g/dL — ABNORMAL LOW (ref 11.3–14.9)
MEAN CORPUSCULAR HEMOGLOBIN CONC: 33.1 g/dL (ref 32.0–36.0)
MEAN CORPUSCULAR HEMOGLOBIN: 29.1 pg (ref 25.9–32.4)
MEAN CORPUSCULAR VOLUME: 87.9 fL (ref 77.6–95.7)
MEAN PLATELET VOLUME: 7.7 fL (ref 6.8–10.7)
PLATELET COUNT: 85 10*9/L — ABNORMAL LOW (ref 150–450)
RED BLOOD CELL COUNT: 3.5 10*12/L — ABNORMAL LOW (ref 3.95–5.13)
RED CELL DISTRIBUTION WIDTH: 18.2 % — ABNORMAL HIGH (ref 12.2–15.2)
WBC ADJUSTED: 2 10*9/L — ABNORMAL LOW (ref 3.6–11.2)

## 2023-04-01 LAB — BASIC METABOLIC PANEL
ANION GAP: 5 mmol/L (ref 5–14)
BLOOD UREA NITROGEN: 10 mg/dL (ref 9–23)
BUN / CREAT RATIO: 15
CALCIUM: 8.8 mg/dL (ref 8.7–10.4)
CHLORIDE: 110 mmol/L — ABNORMAL HIGH (ref 98–107)
CO2: 25 mmol/L (ref 20.0–31.0)
CREATININE: 0.67 mg/dL
EGFR CKD-EPI (2021) FEMALE: >90 mL/min/{1.73_m2} (ref >=60)
GLUCOSE RANDOM: 102 mg/dL (ref 70–179)
POTASSIUM: 4.1 mmol/L (ref 3.4–4.8)
SODIUM: 140 mmol/L (ref 135–145)

## 2023-04-01 LAB — HEPATIC FUNCTION PANEL
ALBUMIN: 3.6 g/dL (ref 3.4–5.0)
ALKALINE PHOSPHATASE: 463 U/L — ABNORMAL HIGH (ref 46–116)
ALT (SGPT): 292 U/L — ABNORMAL HIGH (ref 10–49)
AST (SGOT): 135 U/L — ABNORMAL HIGH (ref <=34)
BILIRUBIN DIRECT: 0.9 mg/dL — ABNORMAL HIGH (ref 0.00–0.30)
BILIRUBIN TOTAL: 1.5 mg/dL — ABNORMAL HIGH (ref 0.3–1.2)
PROTEIN TOTAL: 6.2 g/dL (ref 5.7–8.2)

## 2023-04-01 LAB — PHOSPHORUS: PHOSPHORUS: 3.6 mg/dL (ref 2.4–5.1)

## 2023-04-01 LAB — TACROLIMUS LEVEL, TIMED: TACROLIMUS BLOOD: 9.8 ng/mL

## 2023-04-01 LAB — MAGNESIUM: MAGNESIUM: 1.7 mg/dL (ref 1.6–2.6)

## 2023-04-01 MED ADMIN — tacrolimus (PROGRAF) capsule 3 mg: 3 mg | ORAL | @ 01:00:00

## 2023-04-01 MED ADMIN — letermovir (PREVYMIS) tablet 480 mg: 480 mg | ORAL | @ 12:00:00

## 2023-04-01 MED ADMIN — acyclovir (ZOVIRAX) capsule 400 mg: 400 mg | ORAL | @ 01:00:00

## 2023-04-01 MED ADMIN — magnesium oxide-Mg AA chelate (Magnesium Plus Protein) 1 tablet: 1 | ORAL | @ 12:00:00

## 2023-04-01 MED ADMIN — mycophenolate (CELLCEPT) capsule 250 mg: 250 mg | ORAL | @ 01:00:00

## 2023-04-01 MED ADMIN — acyclovir (ZOVIRAX) capsule 400 mg: 400 mg | ORAL | @ 12:00:00

## 2023-04-01 MED ADMIN — pantoprazole (Protonix) EC tablet 40 mg: 40 mg | ORAL | @ 12:00:00

## 2023-04-01 MED ADMIN — predniSONE (DELTASONE) tablet 5 mg: 5 mg | ORAL | @ 12:00:00

## 2023-04-01 MED ADMIN — gabapentin (NEURONTIN) capsule 400 mg: 400 mg | ORAL | @ 18:00:00

## 2023-04-01 MED ADMIN — mycophenolate (CELLCEPT) capsule 250 mg: 250 mg | ORAL | @ 12:00:00

## 2023-04-01 MED ADMIN — methocarbamol (ROBAXIN) tablet 500 mg: 500 mg | ORAL | @ 01:00:00

## 2023-04-01 MED ADMIN — magnesium oxide-Mg AA chelate (Magnesium Plus Protein) 1 tablet: 1 | ORAL | @ 01:00:00

## 2023-04-01 MED ADMIN — tacrolimus (PROGRAF) capsule 4 mg: 4 mg | ORAL | @ 18:00:00

## 2023-04-01 MED ADMIN — methocarbamol (ROBAXIN) tablet 500 mg: 500 mg | ORAL | @ 22:00:00

## 2023-04-01 MED ADMIN — gabapentin (NEURONTIN) capsule 400 mg: 400 mg | ORAL | @ 01:00:00

## 2023-04-01 MED ADMIN — gabapentin (NEURONTIN) capsule 400 mg: 400 mg | ORAL | @ 12:00:00

## 2023-04-01 MED ADMIN — methocarbamol (ROBAXIN) tablet 500 mg: 500 mg | ORAL | @ 09:00:00

## 2023-04-01 MED ADMIN — methocarbamol (ROBAXIN) tablet 500 mg: 500 mg | ORAL | @ 16:00:00

## 2023-04-01 NOTE — Unmapped (Signed)
A&OX4. VSS. NPO since midnight for procedure. Plan of care reviewed with patient, questions answered.   Problem: Adult Inpatient Plan of Care  Goal: Plan of Care Review  04/01/2023 0126 by Myrle Sheng, RN  Outcome: Progressing  04/01/2023 0126 by Myrle Sheng, RN  Outcome: Progressing  Goal: Patient-Specific Goal (Individualized)  04/01/2023 0126 by Myrle Sheng, RN  Outcome: Progressing  04/01/2023 0126 by Myrle Sheng, RN  Outcome: Progressing  Goal: Absence of Hospital-Acquired Illness or Injury  04/01/2023 0126 by Myrle Sheng, RN  Outcome: Progressing  04/01/2023 0126 by Myrle Sheng, RN  Outcome: Progressing  Intervention: Identify and Manage Fall Risk  Recent Flowsheet Documentation  Taken 04/01/2023 0000 by Myrle Sheng, RN  Safety Interventions: low bed  Taken 03/31/2023 2200 by Myrle Sheng, RN  Safety Interventions: low bed  Taken 03/31/2023 2000 by Myrle Sheng, RN  Safety Interventions: low bed  Intervention: Prevent and Manage VTE (Venous Thromboembolism) Risk  Recent Flowsheet Documentation  Taken 04/01/2023 0000 by Myrle Sheng, RN  Anti-Embolism Device Type: SCD, Knee  Anti-Embolism Intervention: Refused  Anti-Embolism Device Location: BLE  Taken 03/31/2023 2200 by Myrle Sheng, RN  Anti-Embolism Device Type: SCD, Knee  Anti-Embolism Intervention: Refused  Anti-Embolism Device Location: BLE  Taken 03/31/2023 2055 by Myrle Sheng, RN  VTE Prevention/Management:   ambulation promoted   intravenous hydration  Anti-Embolism Device Type: SCD, Knee  Anti-Embolism Intervention: Refused  Anti-Embolism Device Location: BLE  Taken 03/31/2023 2000 by Myrle Sheng, RN  Anti-Embolism Device Type: SCD, Knee  Anti-Embolism Intervention: Refused  Anti-Embolism Device Location: BLE  Goal: Optimal Comfort and Wellbeing  04/01/2023 0126 by Myrle Sheng, RN  Outcome: Progressing  04/01/2023 0126 by Myrle Sheng, RN  Outcome: Progressing  Goal: Readiness for Transition of Care  04/01/2023 0126 by Myrle Sheng, RN  Outcome: Progressing  04/01/2023 0126 by Myrle Sheng, RN  Outcome: Progressing  Goal: Rounds/Family Conference  04/01/2023 0126 by Myrle Sheng, RN  Outcome: Progressing  04/01/2023 0126 by Myrle Sheng, RN  Outcome: Progressing     Problem: Pain Acute  Goal: Optimal Pain Control and Function  04/01/2023 0126 by Myrle Sheng, RN  Outcome: Progressing  04/01/2023 0126 by Myrle Sheng, RN  Outcome: Progressing

## 2023-04-01 NOTE — Unmapped (Signed)
POST-TRANSPLANT PSYCHOLOGICAL EVALUATION    Patient Name: Alicia Armstrong  Medical Record Number: [id number]  Date of Service: March 31, 2023  Clinical Psychologist: Hilbert Corrigan, PsyD  Evaluation Duration and Procedures:  Clinical interview; record review  Procedure Code(s): [phone] (health & behavior assessment) x 1 units    This evaluation note may contain sensitive and confidential information regarding the patient???s psychosocial adjustment to living with a chronic medical condition. DO NOT share this information outside [facility] without written consent from the patient explicitly stating that mental health records may be released.     The limits of confidentiality and the purpose of the evaluation were reviewed. The patient was provided with a verbal description of the nature and purpose of the psychological evaluation. I also reviewed the referral source, specific referral question for this evaluation, foreseeable risks/discomforts, benefits, limits of confidentiality, and mandatory reporting requirements of this provider. The patient was given the opportunity to ask questions and receive answers about the present evaluation. Oral consent was provided by the patient.     BACKGROUND INFORMATION: Ms.  Armstrong was seen for a post-transplant psychological evaluation bedside. She is a 47 y.o. married Hispanic female from [place]. She is s/p OLT. She underwent a liver transplant on 02/15/23.       BEHAVIORAL OBSERVATIONS:   Alicia Armstrong was seen bedside in observation due to being admitted due to possible graft rejection.Marland Kitchen She was interviewed with her husband Paulino present. Pershing Cox from [employer] was also present.Rapport was easily established. She did not seem motivated to present  herself in an overly favorable light.      MENTAL STATUS EXAM:  Appearance: Appears stated age, Well developed, and Clean/Neat  Motor: No abnormal movements  Speech/Language: Normal rate, volume, tone, fluency and Language intact, well formed  Mood:  Good  Affect: Mood congruent  Thought Process: Logical, linear, clear, coherent, goal directed  Thought Content: Denies SI, HI, self harm, delusions, obsessions, paranoid ideation, or ideas of reference  Perceptual Disturbances: Denies auditory and visual hallucinations, behavior not concerning for response to internal stimuli  Orientation: Oriented to person, place, time, and general circumstances  Attention: Able to fully attend without fluctuations in consciousness  Concentration: Able to fully concentrate and attend  Memory: Immediate, short-term, long-term, and recall grossly intact  Fund of Knowledge: Consistent with level of education and development  Insight: Intact  Judgment: Intact  Impulse Control: Intact      INTERVIEW:    Adherence:  Medication Adherence: Excellent. Ms. Saw sets alarms on her phone so that the day before she is able to put medications into a pill box.  Medication Concerns: denied problems taking medications, concerns about side effects, affordability, problems obtaining medications, and difficulty remembering medications  Other Adherence: Good       Lifestyle Issues:  Physical activity:  Good. Alicia Armstrong walks around her house through the day. Alicia Armstrong family is protective of her so they try to limit how active she is as they do not want her to hurt herself.  Nutrition/Appetite:   Alicia Armstrong appetite is improving daily. Currently, she is eating three meals daily with snacks in between. Elaborating, she indicated that she is increasing the quantity of food she consumes.  Sleep: Fair. Alicia Armstrong gets six hours of sleep per night and sometimes has to wake up to go to the bathroom.  Pain (0=no pain; 10=worst pain imaginable): 5/10. Alicia Armstrong experiences pain in her lower back.  Pain Medications:  use  them as prescribed. Alicia Armstrong also takes Tylenol PRN when experiencing the back pain.    Substance Issues:  Nicotine/Tobacco: Denied  Current alcohol use: Denied  Illicit drug use: Denied  -If yes type: N/A  Licit substance abuse or misuse: Denied    Social Issues:  Support/Caregiving Issues: Alicia Armstrong denied any concerns. She stated that her husband and mother are assisting in caring for her.  Social Stressors/Changes: Alicia Armstrong endorsed feeling some stress due to being hospitalized due to graft rejection. Elaborating, Alicia Armstrong indicated that she was not expecting to be hospitalized  so quickly due to rejection. She acknowledged feeling sad that she had to come back to the hospital because of rejection.     Psychological/Adjustment Issues:    Regret/Remorse/Guilt Over Transplant: Denied  Feelings about transplant: Alicia Armstrong described herself as very thankful stating that she had been waiting 18 years to receive a new liver.    Mood Over Past 30 Days (1:NONE to 10:HIGHEST):   --Depression: 1  --Anxiety: 3  --Irritability: 1  --Happiness: 10    Depressive Disorder Symptoms: no    Cognitive Symptoms: Denied    Psychotic Symptoms: Denied    Anxiety:  Worry: Denied  GAD: Denied  Panic: no  Agoraphobia: Denied  Phobias: Denied    PTSD: Denied    Other DSM-V Diagnoses: no    Coping Style: Functional. She listens to church music.      INTERVENTION: Health and Behavior      PSYCHIATRIC DIAGNOSES:   None                               IMPRESSIONS, RECOMMENDATIONS, AND PLAN: Overall, Alicia Armstrong appears to be doing well after undergoing a liver transplant on 02/15/2023. She has excellent adherence to taking her medication as she sets alarms, which reminds her to refill her pill box so that she takes all of her medications. She denied experiencing any concerns related to taking her medications. She walks around her home. She acknowledged that her family is protective of her, so they limit how active she is at this time so that she does not hurt herself. Alicia Armstrong appetite is improving daily as evidenced by eating three meals and snacks throughout the day. She is sleeping 6 hours per night. When she experiences pain in her lower back she is taking Tylenol. Currently, she is not smoking, drinking, or using illicit substances. She is not misusing licit substances. She denied any concerns regarding her care giving indicating that both her mother and husband are caring for her. She acknowledged feeling sad about having to come to the hospital due to graft rejection. Despite experiencing graft rejection, she has no regrets undergoing the transplant and is very thankful for the transplant. She denied any symptoms of depression, trauma, or psychosis. She will become a little anxious regarding doing activities around the home. She listens to church music and preachers to help her cope when feeling stressed. She denied suicidal and homicidal ideation.     Recommendations  Encouraged her to monitor her mood for any changes, and she may be re-referred to transplant psychology if she reports any worsening in mood.  Recommend close monitoring of all of patient's adherence to all medical directives    Should the patient or treatment team notice a change in functioning, please refer back to transplant psychology for further evaluation and treatment.  Recommendations discussed with patient? yes  Agreed upon by patient? yes

## 2023-04-01 NOTE — Unmapped (Signed)
Transplant Surgery Progress Note    Hospital Day: 3    Assessment:     Alicia Armstrong is a 2F history of AIH/MASH cirrhosis s/p OLT 02/15/2023 admitted with abnormal LFTs with predominantly hepatocellular pattern of injury. Vessels patent by doppler. Pending transjugular liver biopsy    Interval Events:     No acute events overnight. Afebrile, HDS. Patient reported 6/10 intermittent, positional pain in RUQ of abdomen. He denied any nausea, vomiting. VIR transjugular liver bx postponed due to inclement weather affecting ventilation systems. Patient given regular diet, made NPO at midnight. 8/7 CMV and HBV not detected    Plan:     Neuro:   - Pain well controlled   - Gaba, robaxin    CV:   - HDS, Maintain SBP < 180    Pulm:   - Stable on room air  - OOB    GI:   - F: 0.9% NS at 40mL/hr   - E: Replete as needed   - N: Regular diet, NPO at midnight w/ sips for meds   - pepcid/pantoprazole, zofran, colace, miralax    GU:   - voiding spontaneously  - 8/6 liver US, interval increase in RIs, narrowing of PV anastomosis   - 8/8 VIR liver biopsy - SCHEDULED    Endo:   -glucose checks ACHS    -SSI    Heme/ID:   - Afebrile, WBC and Hgb stable   - ppx with acyclovir, letermovir   - 8/7 CMV, negative   - 8/7 HBsAG, negative    Immuno:   - tac, cellcept, prednisone  - pharmacy recommendations for dosing      Dispo   - Floor      Objective:        Vital Signs:  BP 138/72  - Pulse 72  - Temp 36.9 ??C (98.4 ??F)  - Resp 18  - Ht 140.3 cm (4' 7.24)  - Wt 63.6 kg (140 lb 4.8 oz)  - SpO2 100%  - BMI 32.33 kg/m??     Input/Output:  No intake/output data recorded.    Physical Exam:    General: Cooperative, no distress, well appearing female  Pulmonary: Normal work of breathing, on room air  Cardiovascular: Regular rate,  Abdomen: Soft, non-distended. Slightly tender to palpation in RUQ.   Musculoskeletal: Moves extremities spontaneously   Neurologic: Alert and interactive, grossly intact    Labs:  Lab Results   Component Value Date WBC 2.4 (L) 03/31/2023    HGB 10.0 (L) 03/31/2023    HCT 30.5 (L) 03/31/2023    PLT 89 (L) 03/31/2023       Lab Results   Component Value Date    NA 139 03/31/2023    K 4.6 03/31/2023    CL 106 03/31/2023    CO2 27.0 03/31/2023    BUN 11 03/31/2023    CREATININE 0.64 03/31/2023    CALCIUM 9.1 03/31/2023    MG 1.7 03/31/2023    PHOS 3.7 03/31/2023       Microbiology Results (last day)       ** No results found for the last 24 hours. **            Imaging:  All pertinent imaging personally reviewed.  8/6 Liver doppler, impression    --Patent hepatic transplant vasculature. Increased resistive indices compared to prior, which are now near or above upper  normal. Additionally, focal area of vascular narrowing distal to the main portal vein anastomosis with  increased velocities in the anterior right and left portal veins.  --Complex appearing collection in the gallbladder fossa, indeterminate may reflect loculated ascites versus cystic duct remnant.  --Increased liver echogenicity, nonspecific.  --Mild hydronephrosis bilaterally. This improves slightly post void.

## 2023-04-01 NOTE — Unmapped (Signed)
Tacrolimus Therapeutic Monitoring Pharmacy Note    Alicia Armstrong is a 47 y.o. female continuing tacrolimus.     Indication: Liver transplant     Date of Transplant:  02/15/2023       Prior Dosing Information: Current regimen Tacrolimus 4 mg AM + 3 mg PM      Source(s) of information used to determine prior to admission dosing: Home Medication List    Goals:  Therapeutic Drug Levels  Tacrolimus trough goal: 8-10 ng/mL    Additional Clinical Monitoring/Outcomes  Monitor renal function (SCr and urine output) and liver function (LFTs)  Monitor for signs/symptoms of adverse events (e.g., hyperglycemia, hyperkalemia, hypomagnesemia, hypertension, headache, tremor)    Results:   Tacrolimus level:  9.8 ng/mL, drawn appropriately    Pharmacokinetic Considerations and Significant Drug Interactions:  Concurrent hepatotoxic medications: None identified  Concurrent CYP3A4 substrates/inhibitors: None identified  Concurrent nephrotoxic medications: None identified    Assessment/Plan:  Recommendedation(s)  Current current regimen Tacrolimus 4 mg QAM + 3 mg QPM     Follow-up  Daily tacrolimus levels .   A pharmacist will continue to monitor and recommend levels as appropriate    Please page service pharmacist with questions/clarifications.    Ardeen Garland PharmD Candidate    Janell Quiet, PharmD, [facility]  Cardiology Clinical Pharmacy Specialist

## 2023-04-01 NOTE — Unmapped (Addendum)
Care Management  Initial Transition Planning Assessment      Patient is known to this writer from previous admissions. 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 CM met with patient and her husband, Franky Macho, to offer support. Discussed ongoing financial assistance referral; patient's son, Dutch Quint, is assisting with obtaining necessary documentation. Aside from feeling down about recent readmission, patient is doing well and denies additional needs at this time. CM will continue to follow for care progression opportunities and to provide support.           General  Care Manager assessed the patient by : In person interview with family  Orientation Level: Oriented X4  Functional level prior to admission: Independent  Reason for referral: Discharge Planning    Contact/Decision Maker  Extended Emergency Contact Information  Primary Emergency Contact: [place]  Home Phone: [phone]  Mobile Phone: [phone]  Relation: Spouse  Secondary Emergency Contact: Grenda,Ammy  Mobile Phone: [phone]  Relation: Daughter    Legal Next of Kin / Guardian / POA / Advance Directives     HCDM (patient stated preference): Doerr,Paulino - Spouse - [phone]    HCDM, back-up (If primary HCDM is unavailable): Careias,Freddy - Other [phone]    Advance Directive (Medical Treatment)  Does patient have an advance directive covering medical treatment?: Patient does not have advance directive covering medical treatment.  Reason patient does not have an advance directive covering medical treatment:: Patient does not wish to complete one at this time.    Health Care Decision Maker [HCDM] (Medical & Mental Health Treatment)  Healthcare Decision Maker: HCDM documented in the HCDM/Contact Info section.  Information offered on HCDM, Medical & Mental Health advance directives:: Other (Comment) (Husband  Paulino)    Advance Directive (Mental Health Treatment)  Does patient have an advance directive covering mental health treatment?: Patient does not have advance directive covering mental health treatment.  Reason patient does not have an advance directive covering mental health treatment:: Patient does not wish to complete one at this time.    Readmission Information    Have you been hospitalized in the last 30 days?: Yes  Name of Hospital: [facility]  Were you being cared for at a skilled nursing facility:: No             Type of Readmission: Related to Previous Admission    Readmission Source: Home with self care       Did the following happen with your discharge?    Did you receive a follow-up/transition call?: Yes     Did you get your discharge medications?  : Yes       Did you go to your scheduled follow up appointment with your doctor on discharge?: Yes                                  Patient Information  Lives with: Spouse/significant other, Family members    Type of Residence: Private residence             Support Systems/Concerns: Family Members, Spouse    Responsibilities/Dependents at home?: No    Home Care services in place prior to admission?: No                          Currently receiving outpatient dialysis?: No  Financial Information       Need for financial assistance?: Other (Comment) (Financial assistance in progress)       Social Determinants of Health  Social Determinants of Health     Financial Resource Strain: Low Risk  (02/23/2023)    Overall Financial Resource Strain (CARDIA)     Difficulty of Paying Living Expenses: Not very hard   Internet Connectivity: No Internet connectivity concern identified (02/23/2023)    Internet Connectivity     Do you have access to internet services: Yes     How do you connect to the internet: Personal Device at home     Is your internet connection strong enough for you to watch video on your device without major problems?: Yes     Do you have enough data to get through the month?: Yes     Does at least one of the devices have a camera that you can use for video chat?: Yes   Food Insecurity: No Food Insecurity (02/23/2023)    Hunger Vital Sign     Worried About Running Out of Food in the Last Year: Never true     Ran Out of Food in the Last Year: Never true   Tobacco Use: Low Risk  (03/22/2023)    Patient History     Smoking Tobacco Use: Never     Smokeless Tobacco Use: Never     Passive Exposure: Not on file   Housing/Utilities: Low Risk  (02/23/2023)    Housing/Utilities     Within the past 12 months, have you ever stayed: outside, in a car, in a tent, in an overnight shelter, or temporarily in someone else's home (i.e. couch-surfing)?: No     Are you worried about losing your housing?: No     Within the past 12 months, have you been unable to get utilities (heat, electricity) when it was really needed?: No   Alcohol Use: Not At Risk (02/23/2023)    Alcohol Use     How often do you have a drink containing alcohol?: Never     How many drinks containing alcohol do you have on a typical day when you are drinking?: 1 - 2     How often do you have 5 or more drinks on one occasion?: Never   Transportation Needs: No Transportation Needs (02/23/2023)    PRAPARE - Transportation     Lack of Transportation (Medical): No     Lack of Transportation (Non-Medical): No   Substance Use: Low Risk  (02/23/2023)    Substance Use     Taken prescription drugs for non-medical reasons: Never     Taken illegal drugs: Never     Patient indicated they have taken drugs in the past year for non-medical reasons: Yes, [positive answer(s)]: Not on file   Health Literacy: Medium Risk (02/23/2023)    Health Literacy     : Sometimes   Physical Activity: Inactive (02/23/2023)    Exercise Vital Sign     Days of Exercise per Week: 0 days     Minutes of Exercise per Session: 0 min   Interpersonal Safety: Not At Risk (02/23/2023)    Interpersonal Safety     Unsafe Where You Currently Live: No     Physically Hurt by Anyone: No     Abused by Anyone: No Stress: No Stress Concern Present (02/23/2023)    [employer] of Occupational Health - Occupational Stress Questionnaire     Feeling of Stress : Not  at all   Intimate Partner Violence: Not At Risk (02/23/2023)    Humiliation, Afraid, Rape, and Kick questionnaire     Fear of Current or Ex-Partner: No     Emotionally Abused: No     Physically Abused: No     Sexually Abused: No   Depression: Not at risk (02/23/2023)    PHQ-2     PHQ-2 Score: 0   Social Connections: Socially Integrated (02/23/2023)    Social Connection and Isolation Panel [NHANES]     Frequency of Communication with Friends and Family: More than three times a week     Frequency of Social Gatherings with Friends and Family: More than three times a week     Attends Religious Services: More than 4 times per year     Active Member of [employer] or Organizations: Yes     Attends Engineer, structural: More than 4 times per year     Marital Status: Married       Complex Discharge Information    Is patient identified as a difficult/complex discharge?: Yes    Reason patient is identified as difficult/complex discharge :  (DDLT)                                                          Interventions:       Discharge Needs Assessment  Concerns to be Addressed: discharge planning    Clinical Risk Factors: Multiple Diagnoses (Chronic)    Barriers to taking medications: No    Prior overnight hospital stay or ED visit in last 90 days: Yes              Anticipated Changes Related to Illness: none    Equipment Needed After Discharge: none    Discharge Facility/Level of Care Needs:      Readmission  Risk of Unplanned Readmission Score: UNPLANNED READMISSION SCORE: 28.85%  Predictive Model Details          29% (High)  Factor Value    Calculated 04/01/2023 16:04 19% Number of hospitalizations in last year 5    [facility] Risk of Unplanned Readmission Model 17% Number of ED visits in last six months 4     14% Number of active inpatient medication orders 26     7% ECG/EKG order present in last 6 months     6% Diagnosis of electrolyte disorder present     6% Restraint order present in last 6 months     5% Charlson Comorbidity Index 6     5% Imaging order present in last 6 months     5% Latest hemoglobin low (10.2 g/dL)     5% Phosphorous result present     3% Active anticoagulant inpatient medication order present     3% Active corticosteroid inpatient medication order present     3% Age 58     2% Future appointment scheduled     1% Current length of stay 1.744 days     1% Active ulcer inpatient medication order present      Readmitted Within the Last 30 Days? (No if blank) Yes  Patient at risk for readmission?: Yes    Discharge Plan  Screen findings are: Care Manager reviewed the plan of the patient's care with the Multidisciplinary Team. No discharge planning needs identified  at this time. Care Manager will continue to manage plan and monitor patient's progress with the team.    Expected Discharge Date: 04/02/2023    Expected Transfer from Critical Care:      Quality data for continuing care services shared with patient and/or representative?: N/A  Patient and/or family were provided with choice of facilities / services that are available and appropriate to meet post hospital care needs?: N/A       Initial Assessment complete?: Yes

## 2023-04-02 LAB — CBC
HEMATOCRIT: 30.5 % — ABNORMAL LOW (ref 34.0–44.0)
HEMOGLOBIN: 10 g/dL — ABNORMAL LOW (ref 11.3–14.9)
MEAN CORPUSCULAR HEMOGLOBIN CONC: 32.8 g/dL (ref 32.0–36.0)
MEAN CORPUSCULAR HEMOGLOBIN: 28.7 pg (ref 25.9–32.4)
MEAN CORPUSCULAR VOLUME: 87.4 fL (ref 77.6–95.7)
MEAN PLATELET VOLUME: 8.1 fL (ref 6.8–10.7)
PLATELET COUNT: 94 10*9/L — ABNORMAL LOW (ref 150–450)
RED BLOOD CELL COUNT: 3.48 10*12/L — ABNORMAL LOW (ref 3.95–5.13)
RED CELL DISTRIBUTION WIDTH: 17.8 % — ABNORMAL HIGH (ref 12.2–15.2)
WBC ADJUSTED: 2 10*9/L — ABNORMAL LOW (ref 3.6–11.2)

## 2023-04-02 LAB — BASIC METABOLIC PANEL
ANION GAP: 7 mmol/L (ref 5–14)
BLOOD UREA NITROGEN: 13 mg/dL (ref 9–23)
BUN / CREAT RATIO: 21
CALCIUM: 8.9 mg/dL (ref 8.7–10.4)
CHLORIDE: 108 mmol/L — ABNORMAL HIGH (ref 98–107)
CO2: 24 mmol/L (ref 20.0–31.0)
CREATININE: 0.62 mg/dL
EGFR CKD-EPI (2021) FEMALE: >90 mL/min/{1.73_m2} (ref >=60)
GLUCOSE RANDOM: 115 mg/dL (ref 70–179)
POTASSIUM: 3.8 mmol/L (ref 3.4–4.8)
SODIUM: 139 mmol/L (ref 135–145)

## 2023-04-02 LAB — HEPATIC FUNCTION PANEL
ALBUMIN: 3.5 g/dL (ref 3.4–5.0)
ALKALINE PHOSPHATASE: 561 U/L — ABNORMAL HIGH (ref 46–116)
ALT (SGPT): 280 U/L — ABNORMAL HIGH (ref 10–49)
AST (SGOT): 155 U/L — ABNORMAL HIGH (ref <=34)
BILIRUBIN DIRECT: 1 mg/dL — ABNORMAL HIGH (ref 0.00–0.30)
BILIRUBIN TOTAL: 1.5 mg/dL — ABNORMAL HIGH (ref 0.3–1.2)
PROTEIN TOTAL: 6.6 g/dL (ref 5.7–8.2)

## 2023-04-02 LAB — MAGNESIUM: MAGNESIUM: 1.6 mg/dL (ref 1.6–2.6)

## 2023-04-02 LAB — TACROLIMUS LEVEL, TIMED: TACROLIMUS BLOOD: 11.5 ng/mL

## 2023-04-02 LAB — PHOSPHORUS: PHOSPHORUS: 3.6 mg/dL (ref 2.4–5.1)

## 2023-04-02 MED ORDER — ACYCLOVIR 400 MG TABLET
ORAL_TABLET | Freq: Two times a day (BID) | ORAL | 1 refills | 30 days
Start: 2023-04-02 — End: ?

## 2023-04-02 MED ADMIN — letermovir (PREVYMIS) tablet 480 mg: 480 mg | ORAL | @ 12:00:00

## 2023-04-02 MED ADMIN — midazolam (VERSED) injection 0.5 mg: .5 mg | INTRAVENOUS | @ 19:00:00 | Stop: 2023-04-02

## 2023-04-02 MED ADMIN — pantoprazole (Protonix) EC tablet 40 mg: 40 mg | ORAL | @ 12:00:00

## 2023-04-02 MED ADMIN — methocarbamol (ROBAXIN) tablet 500 mg: 500 mg | ORAL | @ 10:00:00

## 2023-04-02 MED ADMIN — methocarbamol (ROBAXIN) tablet 500 mg: 500 mg | ORAL | @ 21:00:00

## 2023-04-02 MED ADMIN — bacitracin-polymyxin b (POLYSPORIN) ointment: TOPICAL | @ 20:00:00 | Stop: 2023-04-02

## 2023-04-02 MED ADMIN — iohexol (OMNIPAQUE) 300 mg iodine/mL solution 30 mL: 30 mL | @ 20:00:00 | Stop: 2023-04-02

## 2023-04-02 MED ADMIN — fentaNYL (PF) (SUBLIMAZE) injection: INTRAVENOUS | @ 19:00:00 | Stop: 2023-04-02

## 2023-04-02 MED ADMIN — acyclovir (ZOVIRAX) capsule 400 mg: 400 mg | ORAL

## 2023-04-02 MED ADMIN — methocarbamol (ROBAXIN) tablet 500 mg: 500 mg | ORAL | @ 16:00:00

## 2023-04-02 MED ADMIN — methocarbamol (ROBAXIN) tablet 500 mg: 500 mg | ORAL

## 2023-04-02 MED ADMIN — tacrolimus (PROGRAF) capsule 3 mg: 3 mg | ORAL

## 2023-04-02 MED ADMIN — magnesium oxide-Mg AA chelate (Magnesium Plus Protein) 1 tablet: 1 | ORAL

## 2023-04-02 MED ADMIN — midazolam (VERSED) injection: INTRAVENOUS | @ 19:00:00 | Stop: 2023-04-02

## 2023-04-02 MED ADMIN — predniSONE (DELTASONE) tablet 5 mg: 5 mg | ORAL | @ 12:00:00

## 2023-04-02 MED ADMIN — gabapentin (NEURONTIN) capsule 400 mg: 400 mg | ORAL

## 2023-04-02 MED ADMIN — tacrolimus (PROGRAF) capsule 4 mg: 4 mg | ORAL | @ 12:00:00

## 2023-04-02 MED ADMIN — acyclovir (ZOVIRAX) capsule 400 mg: 400 mg | ORAL | @ 12:00:00

## 2023-04-02 MED ADMIN — gabapentin (NEURONTIN) capsule 400 mg: 400 mg | ORAL | @ 12:00:00

## 2023-04-02 MED ADMIN — magnesium oxide-Mg AA chelate (Magnesium Plus Protein) 1 tablet: 1 | ORAL | @ 12:00:00

## 2023-04-02 MED ADMIN — mycophenolate (CELLCEPT) capsule 250 mg: 250 mg | ORAL | @ 12:00:00

## 2023-04-02 MED ADMIN — lidocaine (PF) (XYLOCAINE-MPF) 10 mg/mL (1 %) injection: @ 19:00:00 | Stop: 2023-04-02

## 2023-04-02 MED ADMIN — sodium chloride (NS) 0.9 % infusion: 75 mL/h | INTRAVENOUS | @ 09:00:00

## 2023-04-02 MED ADMIN — gabapentin (NEURONTIN) capsule 400 mg: 400 mg | ORAL | @ 21:00:00

## 2023-04-02 MED ADMIN — mycophenolate (CELLCEPT) capsule 250 mg: 250 mg | ORAL

## 2023-04-02 NOTE — Unmapped (Signed)
Problem: Adult Inpatient Plan of Care  Goal: Plan of Care Review  Outcome: Progressing  Goal: Patient-Specific Goal (Individualized)  Outcome: Progressing  Goal: Absence of Hospital-Acquired Illness or Injury  Outcome: Progressing  Intervention: Prevent Skin Injury  Recent Flowsheet Documentation  Taken 04/01/2023 0730 by Roger Kill, RN  Positioning for Skin: Right  Goal: Optimal Comfort and Wellbeing  Outcome: Progressing  Goal: Readiness for Transition of Care  Outcome: Progressing  Goal: Rounds/Family Conference  Outcome: Progressing     Problem: Pain Acute  Goal: Optimal Pain Control and Function  Outcome: Progressing   VSS. No pain and no discomfort noted. Self care, will cont to monitor.

## 2023-04-02 NOTE — Unmapped (Signed)
VSS.   Pt afebrile . With no s/s of infection noted . No falls noted. Pt educated to call for assistance if needed..NO for procedure in the AM. No request for  prn pain meds. DVT protocol in place but on hold for procedure in the AM. Will continue to monitor.    Problem: Adult Inpatient Plan of Care  Goal: Plan of Care Review  Outcome: Progressing  Goal: Patient-Specific Goal (Individualized)  Outcome: Progressing  Goal: Absence of Hospital-Acquired Illness or Injury  Outcome: Progressing  Intervention: Identify and Manage Fall Risk  Recent Flowsheet Documentation  Taken 04/01/2023 2025 by Darene Lamer, RN  Safety Interventions:   fall reduction program maintained   low bed   nonskid shoes/slippers when out of bed  Intervention: Prevent Skin Injury  Recent Flowsheet Documentation  Taken 04/01/2023 2025 by Darene Lamer, RN  Positioning for Skin: Supine/Back  Device Skin Pressure Protection: adhesive use limited  Skin Protection: adhesive use limited  Intervention: Prevent Infection  Recent Flowsheet Documentation  Taken 04/01/2023 2025 by Darene Lamer, RN  Infection Prevention: rest/sleep promoted  Goal: Optimal Comfort and Wellbeing  Outcome: Progressing  Goal: Readiness for Transition of Care  Outcome: Progressing  Goal: Rounds/Family Conference  Outcome: Progressing     Problem: Fall Injury Risk  Goal: Absence of Fall and Fall-Related Injury  Intervention: Promote Injury-Free Environment  Recent Flowsheet Documentation  Taken 04/01/2023 2025 by Darene Lamer, RN  Safety Interventions:   fall reduction program maintained   low bed   nonskid shoes/slippers when out of bed     Problem: Wound  Goal: Optimal Functional Ability  Intervention: Optimize Functional Ability  Recent Flowsheet Documentation  Taken 04/01/2023 2025 by Darene Lamer, RN  Activity Management: up ad lib  Goal: Skin Health and Integrity  Intervention: Optimize Skin Protection  Recent Flowsheet Documentation  Taken 04/01/2023 2025 by Darene Lamer, RN  Activity Management: up ad lib  Pressure Reduction Techniques: frequent weight shift encouraged  Pressure Reduction Devices: pressure-redistributing mattress utilized  Skin Protection: adhesive use limited     Problem: Wound  Goal: Skin Health and Integrity  Intervention: Optimize Skin Protection  Recent Flowsheet Documentation  Taken 04/01/2023 2025 by Darene Lamer, RN  Activity Management: up ad lib  Pressure Reduction Techniques: frequent weight shift encouraged  Pressure Reduction Devices: pressure-redistributing mattress utilized  Skin Protection: adhesive use limited     Problem: Pain Acute  Goal: Optimal Pain Control and Function  Outcome: Progressing

## 2023-04-02 NOTE — Unmapped (Signed)
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 Vessels patent by doppler. Pending transjugular liver biopsy    Interval Events:     No acute events overnight. Pain well controlled. Afebrile, HDS.  LFTs elevated but stable. Patient NPO for VIR transjugular liver bx today. Transferring to 5w Today.    Plan:     Neuro:   - Pain well controlled   - Gaba, robaxin    CV:   - HDS, Maintain SBP < 180    Pulm:   - Stable on room air  - OOB    GI:   - F: 0.9% NS at 80mL/hr   - E: Replete as needed   - N: Regular diet, NPO at midnight w/ sips for meds   - pepcid/pantoprazole, zofran, colace, miralax    GU:   - voiding spontaneously  - 8/6 liver US, interval increase in RIs, narrowing of PV anastomosis   - 8/8 VIR liver biopsy - SCHEDULED    Endo:   -glucose checks ACHS    -SSI    Heme/ID:   - Afebrile, WBC and Hgb stable   - ppx with acyclovir, letermovir   - 8/7 CMV, negative   - 8/7 HBsAG, negative    Immuno:   - tac, cellcept, prednisone  - pharmacy recommendations for dosing      Dispo   - Floor      Objective:        Vital Signs:  BP 145/88  - Pulse 69  - Temp 36.4 ??C (97.5 ??F) (Oral)  - Resp 16  - Ht 140.3 cm (4' 7.24)  - Wt 63.6 kg (140 lb 4.8 oz)  - SpO2 100%  - BMI 32.33 kg/m??     Input/Output:  I/O last 3 completed shifts:  In: 400 [P.O.:400]  Out: -     Physical Exam:    General: Cooperative, no distress, well appearing female  Pulmonary: Normal work of breathing, on room air  Cardiovascular: Regular rate,  Abdomen: Soft, non-distended.   Musculoskeletal: Moves extremities spontaneously   Neurologic: Alert and interactive, grossly intact    Labs:  Lab Results   Component Value Date    WBC 2.0 (L) 04/02/2023    HGB 10.0 (L) 04/02/2023    HCT 30.5 (L) 04/02/2023    PLT 94 (L) 04/02/2023       Lab Results   Component Value Date    NA 139 04/02/2023    K 3.8 04/02/2023    CL 108 (H) 04/02/2023    CO2 24.0 04/02/2023    BUN 13 04/02/2023    CREATININE 0.62 04/02/2023    CALCIUM 8.9 04/02/2023    MG 1.6 04/02/2023    PHOS 3.6 04/02/2023       Microbiology Results (last day)       ** No results found for the last 24 hours. **            Imaging:  All pertinent imaging personally reviewed.  8/6 Liver doppler, impression    --Patent hepatic transplant vasculature. Increased resistive indices compared to prior, which are now near or above upper  normal. Additionally, focal area of vascular narrowing distal to the main portal vein anastomosis with increased velocities in the anterior right and left portal veins.  --Complex appearing collection in  the gallbladder fossa, indeterminate may reflect loculated ascites versus cystic duct remnant.  --Increased liver echogenicity, nonspecific.  --Mild hydronephrosis bilaterally. This improves slightly post void.

## 2023-04-02 NOTE — Unmapped (Signed)
[facility] INTERVENTIONAL [facility]IOLOG[facility] - Operative Note     VIR Post-Procedure Note    Procedure Name: TJLB    Pre-Op Diagnosis: s/p liver transplant with elevated LFTs    Post-Op Diagnosis: Same as pre-operative diagnosis    VIR Providers    Attending Physician: Dr. Jamse Mead    Fellow/Resident: Jake Bathe, MD    Time out: Prior to the procedure, a time out was performed with all team members present. During the time out, the patient, procedure and procedure site when applicable were verbally verified by the team members and Jake Bathe, MD.    Description of procedure: Successful TJLB. 5 passes 4 cores. Right atrial pressure: 3 mmHg. Free hepatic: 4 mm Hg. Wedge pressure: 7 mm Hg    Sedation:Moderate sedation    Estimated Blood Loss: approximately 5 mL  Complications: None    See detailed procedure note with images in PACS ([email or web address]).    The patient tolerated the procedure well without incident or complication and left the room in stable condition.    Jake Bathe, MD  04/02/2023 4:22 PM

## 2023-04-03 LAB — HEPATIC FUNCTION PANEL
ALBUMIN: 3.5 g/dL (ref 3.4–5.0)
ALBUMIN: 3.8 g/dL (ref 3.4–5.0)
ALKALINE PHOSPHATASE: 709 U/L — ABNORMAL HIGH (ref 46–116)
ALKALINE PHOSPHATASE: 856 U/L — ABNORMAL HIGH (ref 46–116)
ALT (SGPT): 235 U/L — ABNORMAL HIGH (ref 10–49)
ALT (SGPT): 226 U/L — ABNORMAL HIGH (ref 10–49)
AST (SGOT): 96 U/L — ABNORMAL HIGH (ref <=34)
AST (SGOT): 85 U/L — ABNORMAL HIGH (ref <=34)
BILIRUBIN DIRECT: 3.8 mg/dL — ABNORMAL HIGH (ref 0.00–0.30)
BILIRUBIN DIRECT: 5.7 mg/dL — ABNORMAL HIGH (ref 0.00–0.30)
BILIRUBIN TOTAL: 5.3 mg/dL — ABNORMAL HIGH (ref 0.3–1.2)
BILIRUBIN TOTAL: 7.9 mg/dL — ABNORMAL HIGH (ref 0.3–1.2)
PROTEIN TOTAL: 6.4 g/dL (ref 5.7–8.2)
PROTEIN TOTAL: 6.9 g/dL (ref 5.7–8.2)

## 2023-04-03 LAB — CBC
HEMATOCRIT: 30.2 % — ABNORMAL LOW (ref 34.0–44.0)
HEMOGLOBIN: 9.9 g/dL — ABNORMAL LOW (ref 11.3–14.9)
MEAN CORPUSCULAR HEMOGLOBIN CONC: 32.9 g/dL (ref 32.0–36.0)
MEAN CORPUSCULAR HEMOGLOBIN: 28.7 pg (ref 25.9–32.4)
MEAN CORPUSCULAR VOLUME: 87.5 fL (ref 77.6–95.7)
MEAN PLATELET VOLUME: 8.3 fL (ref 6.8–10.7)
PLATELET COUNT: 97 10*9/L — ABNORMAL LOW (ref 150–450)
RED BLOOD CELL COUNT: 3.45 10*12/L — ABNORMAL LOW (ref 3.95–5.13)
RED CELL DISTRIBUTION WIDTH: 17.6 % — ABNORMAL HIGH (ref 12.2–15.2)
WBC ADJUSTED: 1.9 10*9/L — ABNORMAL LOW (ref 3.6–11.2)

## 2023-04-03 LAB — BASIC METABOLIC PANEL
ANION GAP: 5 mmol/L (ref 5–14)
ANION GAP: 6 mmol/L (ref 5–14)
BLOOD UREA NITROGEN: 13 mg/dL (ref 9–23)
BLOOD UREA NITROGEN: 15 mg/dL (ref 9–23)
BUN / CREAT RATIO: 18
BUN / CREAT RATIO: 23
CALCIUM: 9 mg/dL (ref 8.7–10.4)
CALCIUM: 9.2 mg/dL (ref 8.7–10.4)
CHLORIDE: 108 mmol/L — ABNORMAL HIGH (ref 98–107)
CHLORIDE: 106 mmol/L (ref 98–107)
CO2: 28 mmol/L (ref 20.0–31.0)
CO2: 26 mmol/L (ref 20.0–31.0)
CREATININE: 0.73 mg/dL
CREATININE: 0.64 mg/dL
EGFR CKD-EPI (2021) FEMALE: >90 mL/min/{1.73_m2} (ref >=60)
EGFR CKD-EPI (2021) FEMALE: >90 mL/min/{1.73_m2} (ref >=60)
GLUCOSE RANDOM: 128 mg/dL (ref 70–179)
GLUCOSE RANDOM: 159 mg/dL (ref 70–179)
POTASSIUM: 4.5 mmol/L (ref 3.4–4.8)
POTASSIUM: 4.8 mmol/L (ref 3.4–4.8)
SODIUM: 141 mmol/L (ref 135–145)
SODIUM: 138 mmol/L (ref 135–145)

## 2023-04-03 LAB — MAGNESIUM
MAGNESIUM: 1.5 mg/dL — ABNORMAL LOW (ref 1.6–2.6)
MAGNESIUM: 1.4 mg/dL — ABNORMAL LOW (ref 1.6–2.6)

## 2023-04-03 LAB — TACROLIMUS LEVEL, TIMED: TACROLIMUS BLOOD: 9.4 ng/mL

## 2023-04-03 LAB — PHOSPHORUS
PHOSPHORUS: 3.6 mg/dL (ref 2.4–5.1)
PHOSPHORUS: 3.4 mg/dL (ref 2.4–5.1)

## 2023-04-03 MED ADMIN — magnesium oxide-Mg AA chelate (Magnesium Plus Protein) 1 tablet: 1 | ORAL | @ 13:00:00

## 2023-04-03 MED ADMIN — insulin lispro (HumaLOG) injection 1 Units: 1 [IU] | SUBCUTANEOUS | @ 07:00:00 | Stop: 2023-04-03

## 2023-04-03 MED ADMIN — mycophenolate (CELLCEPT) capsule 250 mg: 250 mg | ORAL | @ 13:00:00

## 2023-04-03 MED ADMIN — insulin lispro (HumaLOG) injection 3 Units: 3 [IU] | SUBCUTANEOUS | @ 22:00:00

## 2023-04-03 MED ADMIN — pantoprazole (Protonix) EC tablet 40 mg: 40 mg | ORAL | @ 13:00:00

## 2023-04-03 MED ADMIN — magnesium sulfate 2gm/50mL IVPB: 2 g | INTRAVENOUS | @ 22:00:00 | Stop: 2023-04-03

## 2023-04-03 MED ADMIN — letermovir (PREVYMIS) tablet 480 mg: 480 mg | ORAL | @ 13:00:00

## 2023-04-03 MED ADMIN — methocarbamol (ROBAXIN) tablet 500 mg: 500 mg | ORAL | @ 11:00:00

## 2023-04-03 MED ADMIN — tacrolimus (PROGRAF) capsule 4 mg: 4 mg | ORAL | @ 13:00:00

## 2023-04-03 MED ADMIN — acyclovir (ZOVIRAX) capsule 400 mg: 400 mg | ORAL | @ 13:00:00

## 2023-04-03 MED ADMIN — gabapentin (NEURONTIN) capsule 400 mg: 400 mg | ORAL | @ 19:00:00

## 2023-04-03 MED ADMIN — magnesium oxide-Mg AA chelate (Magnesium Plus Protein) 1 tablet: 1 | ORAL

## 2023-04-03 MED ADMIN — predniSONE (DELTASONE) tablet 5 mg: 5 mg | ORAL | @ 13:00:00 | Stop: 2023-04-03

## 2023-04-03 MED ADMIN — tacrolimus (PROGRAF) capsule 3 mg: 3 mg | ORAL

## 2023-04-03 MED ADMIN — amlodipine (NORVASC) tablet 5 mg: 5 mg | ORAL | @ 14:00:00

## 2023-04-03 MED ADMIN — gabapentin (NEURONTIN) capsule 400 mg: 400 mg | ORAL | @ 13:00:00

## 2023-04-03 MED ADMIN — mycophenolate (CELLCEPT) capsule 250 mg: 250 mg | ORAL

## 2023-04-03 MED ADMIN — methocarbamol (ROBAXIN) tablet 500 mg: 500 mg | ORAL | @ 19:00:00

## 2023-04-03 MED ADMIN — insulin lispro (HumaLOG) injection 1 Units: 1 [IU] | SUBCUTANEOUS | @ 11:00:00 | Stop: 2023-04-03

## 2023-04-03 MED ADMIN — aspirin chewable tablet 81 mg: 81 mg | ORAL | @ 13:00:00

## 2023-04-03 MED ADMIN — heparin (porcine) 5,000 unit/mL injection 5,000 Units: 5000 [IU] | SUBCUTANEOUS | @ 19:00:00

## 2023-04-03 MED ADMIN — heparin (porcine) 5,000 unit/mL injection 5,000 Units: 5000 [IU] | SUBCUTANEOUS | @ 14:00:00

## 2023-04-03 MED ADMIN — methylPREDNISolone sodium succinate (PF) (SOLU-Medrol) 1,000 mg in sodium chloride (NS) 0.9 % 50 mL IVPB: 1000 mg | INTRAVENOUS | @ 22:00:00 | Stop: 2023-04-03

## 2023-04-03 MED ADMIN — methocarbamol (ROBAXIN) tablet 500 mg: 500 mg | ORAL | @ 22:00:00

## 2023-04-03 MED ADMIN — insulin NPH (HumuLIN,NovoLIN) injection 5 Units: .15 [IU]/kg/d | SUBCUTANEOUS | @ 22:00:00

## 2023-04-03 MED ADMIN — methocarbamol (ROBAXIN) tablet 500 mg: 500 mg | ORAL

## 2023-04-03 MED ADMIN — acyclovir (ZOVIRAX) capsule 400 mg: 400 mg | ORAL

## 2023-04-03 MED ADMIN — gabapentin (NEURONTIN) capsule 400 mg: 400 mg | ORAL

## 2023-04-03 MED ADMIN — acetaminophen (TYLENOL) tablet 650 mg: 650 mg | ORAL | @ 04:00:00

## 2023-04-03 NOTE — Unmapped (Signed)
Endocrine Team Diabetes New Consult Note     Consult information:  Requesting Attending Physician : Gemma Payor, MD  Service Requesting Consult : Surg Transplant [phone])  Primary Care Provider: Doris Cheadle, MD  Impression:  Alicia Armstrong is a 47 y.o. 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 We have been consulted at the request of Gemma Payor, MD to evaluate Alicia Armstrong for hyperglycemia.     Medical Decision Making:  Diagnoses:  1. History of steroid induced-diabetes with plans for initiation of high-dose steroid  With hyperglycemia.  2. Nutrition: Complicating glycemic control. Increasing risk for both hypoglycemia and hyperglycemia.  3. Transplant. Complicating glycemic control and increasing risk for hyperglycemia.  4. Steroids. Complicating glycemic control and increasing risk for hyperglycemia.      Studies reviewed 04/03/23:  Labs: CBC, BMP, POCT-BG, and HbA1C  Interpretation: Leukopenia noted. Anemia noted. Normal sodium. Intermittent hyperglycemia. A1C in non-diabetes range elevate LFTs with concern for rejection . BG are within goal on sliding scale alone, but required significant insulin with high dose steroids in the past.  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 Glycemia currently within hospital goals on sliding scale alone. Given concern for steroid-induced hyperglycemia with planned initiation of 1g solumedrol, we will suggest initiation of a conservative dose of insulin when steroids are started to prevent severe hypoglycemia. Will adjust as necessary during the coming days.      Recommendations:  - When steroids are initiated, please start  --- 5 units NPH q12 hours - Hypoglycemia protocol.  --- 3 units Lispro TID AC  --- Lispro correction factor 30 5x daily  (ACHS + 3am)  - POCT-BG 5 times a day.  - Ensure patient is on glucose precautions if patient taking nutrition by mouth.     Discharge planning:  In process. Will complete closer to discharge.Anticipate being able to discharge off antihyperglycemic agents.     Thank you for this consult. Discussed plan with primary team. We will continue to follow and make recommendations and place orders as appropriate.    Please page with questions or concerns: Daivd Council, [place]: [phone]  Endocrinology Diabetes Care Team on call from 6AM - 3PM on weekdays then endocrine fellow on call: [phone] from 3PM - 6AM on weekdays and on weekends and holidays.   If APP cannot be reached, please page the endocrine fellow on call.      Subjective:  Initial HPI:  Alicia Armstrong is a 47 y.o. 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 We have been consulted at the request of Gemma Payor, MD to evaluate Alicia Armstrong for presumptive hyperglycemia in the setting of high dose steroids. After surgery, she required insulin during high dose steroids - though she has no history of diabetes prior to transplant. Glucose during current hospital stay have been in the mid 100s requiring minimal sliding scale insulin.     Diabetes History:  Patient has a history of  steroid induced hyperglycemia  diagnosed during last hospitalization.  Diabetes is managed by: n/a.  Current home diabetes regimen: none.  Current home blood glucose monitoring: 2x a day, blood sugars running in the low 100s.  Hypoglycemia awareness: n/a.  Complications related to diabetes: none known  Current Nutrition:  Active Orders   Diet    Nutrition Therapy Regular/House       ROS: As per HPI.     acyclovir  400 mg Oral BID    amlodipine  5 mg Oral Daily    aspirin  81 mg Oral Daily    gabapentin  400 mg Oral TID    heparin (porcine) for subcutaneous use  5,000 Units Subcutaneous Q8H SCH    insulin lispro  0-20 Units Subcutaneous ACHS    letermovir  480 mg Oral Daily    magnesium oxide-Mg AA chelate  1 tablet Oral BID    methocarbamol  500 mg Oral QID    methylPREDNISolone sodium succinate  1,000 mg Intravenous Once    mycophenolate  250 mg Oral BID    pantoprazole  40 mg Oral Daily    tacrolimus  4 mg Oral Daily    And    tacrolimus  3 mg Oral Daily       Current Outpatient Medications   Medication Instructions    acetaminophen (TYLENOL) 325-650 mg, Oral, Every 8 hours PRN    acyclovir (ZOVIRAX) 400 mg, Oral, 2 times a day (standard)    aspirin (ECOTRIN) 81 mg, Oral, Daily (standard)    calcium carbonate (TUMS) 200 mg calcium (500 mg) chewable tablet 1 tablet, Oral, 3 times a day (standard)    docusate sodium (COLACE) 100 mg, Oral, 2 times a day PRN    enoxaparin (LOVENOX) 40 mg, Subcutaneous, Every 12 hours    gabapentin (NEURONTIN) 400 mg, Oral, 3 times a day (standard)    lancets Misc Use to check blood sugar as directed with insulin 3 times a day & for symptoms of high or low blood sugar.    letermovir (PREVYMIS) 480 mg, Oral, Daily (standard)    lidocaine 4 % patch Place 1 patch on skin around the incision for 12 hours nightly, then remove for 12 hours. Do not place on open skin or the incision directly.    magnesium oxide-Mg AA chelate (MAGNESIUM, AMINO ACID CHELATE,) 133 mg 1 tablet, Oral, 2 times a day (standard)    methocarbamol (ROBAXIN) 500 mg, Oral, 4 times a day    mycophenolate (CELLCEPT) 250 mg, Oral, 2 times a day (standard)    pantoprazole (PROTONIX) 40 mg, Oral, Daily (standard)    pen needle, diabetic 32 gauge x 5/32 (4 mm) Ndle Use with insulin up to 4 times/day as needed.    pentamidine in sterile water 300 mg, Inhalation, Every 28 days    polyethylene glycol (MIRALAX) 17 gram packet Mix 1 packet in  4 to 8 ounces of liquid and drink daily as needed.    predniSONE (DELTASONE) 5 mg, Oral, Daily (standard)    SITagliptin phosphate (JANUVIA) 100 mg, Oral, Daily (standard)    tacrolimus (PROGRAF) 1 MG capsule Take 4 capsules (4 mg total) by mouth daily AND 3 capsules (3 mg total) nightly.           Past Medical History:   Diagnosis Date    Cirrhosis (CMS-HCC)        Past Surgical History:   Procedure Laterality Date    CESAREAN SECTION      PR COLSC FLX W/RMVL OF TUMOR POLYP LESION SNARE TQ N/A 09/11/2022    Procedure: COLONOSCOPY FLEX; W/REMOV TUMOR/LES BY SNARE;  Surgeon: Mickie Hillier, MD;  [facility];  Service: Gastroenterology    PR REMV VEIN CLOT CAVA-ILIAC,LEG INCIS N/A 02/14/2023  Procedure: THROMBEC DIRECT OR W/CATH; VENA CAVA BY LEG INCS;  Surgeon: Florene Glen, MD;  Location: MAIN OR [place];  Service: Transplant    PR TRANSPLANT LIVER,ALLOTRANSPLANT Bilateral 02/14/2023    Procedure: LIVER ALLOTRANSPLANTATION; ORTHOTOPIC, PARTIAL OR WHOLE, FROM CADAVER OR LIVING DONOR, ANY AGE;  Surgeon: Florene Glen, MD;  Location: MAIN OR [place];  Service: Transplant    PR TRANSPLANT,PREP DONOR LIVER, WHOLE N/A 02/14/2023    Procedure: [facility] STD PREP CAD DONOR WHOLE LIVER GFT PRIOR TNSPLNT,INC CHOLE,DISS/REM SURR TISSU WO TRISEG/LOBE SPLT;  Surgeon: Florene Glen, MD;  Location: MAIN OR [place];  Service: Transplant    PR UPPER GI ENDOSCOPY,DIAGNOSIS N/A 04/02/2017    Procedure: UGI ENDO, INCLUDE ESOPHAGUS, STOMACH, & DUODENUM &/OR JEJUNUM; DX W/WO COLLECTION SPECIMN, BY BRUSH OR WASH;  Surgeon: Janyth Pupa, MD;  [facility];  Service: Gastroenterology    PR UPPER GI ENDOSCOPY,DIAGNOSIS N/A 09/11/2022    Procedure: UGI ENDO, INCLUDE ESOPHAGUS, STOMACH, & DUODENUM &/OR JEJUNUM; DX W/WO COLLECTION SPECIMN, BY BRUSH OR WASH;  Surgeon: Mickie Hillier, MD;  [facility]: [facility];  Service: Gastroenterology       Family History   Problem Relation Age of Onset    No Known Problems Mother     Diabetes Father     No Known Problems Sister     No Known Problems Daughter     No Known Problems Maternal Grandmother     No Known Problems Maternal Grandfather     No Known Problems Paternal Grandmother     No Known Problems Paternal Grandfather     BRCA 1/2 Neg Hx     Breast cancer Neg Hx     Cancer Neg Hx     Colon cancer Neg Hx     Endometrial cancer Neg Hx     Ovarian cancer Neg Hx        Social History     Tobacco Use    Smoking status: Never    Smokeless tobacco: Never   Vaping Use    Vaping status: Never Used   Substance Use Topics    Alcohol use: No     Alcohol/week: 0.0 standard drinks of alcohol    Drug use: No       OBJECTIVE:  BP 155/85  - Pulse 60  - Temp 36.9 ??C (98.4 ??F) (Oral)  - Resp 18  - Ht 140.3 cm (4' 7.24)  - Wt 63.6 kg (140 lb 4.8 oz)  - SpO2 100%  - BMI 32.33 kg/m??   Wt Readings from Last 12 Encounters:   03/31/23 63.6 kg (140 lb 4.8 oz)   03/22/23 63.6 kg (140 lb 4.8 oz)   03/22/23 63.6 kg (140 lb 4.8 oz)   03/10/23 74.2 kg (163 lb 9.3 oz)   03/08/23 72.6 kg (160 lb)   03/01/23 77.2 kg (170 lb 1.6 oz)   02/21/23 75.6 kg (166 lb 10.7 oz)   02/06/23 77.9 kg (171 lb 11.2 oz)   02/02/23 86.3 kg (190 lb 3.2 oz)   01/15/23 76.2 kg (168 lb)   12/29/22 73.4 kg (161 lb 13.1 oz)   12/16/22 74.3 kg (163 lb 11.2 oz)     Physical Exam  Vitals reviewed.   Constitutional:       Appearance: Normal appearance.   Eyes:      General: Scleral icterus present.   Cardiovascular:      Rate and Rhythm: Normal rate and regular rhythm.  Pulses: Normal pulses.   Pulmonary:      Effort: Pulmonary effort is normal.      Breath sounds: Normal breath sounds.   Musculoskeletal:         General: No swelling.   Skin:     General: Skin is warm and dry.   Neurological:      General: No focal deficit present.      Mental Status: She is alert and oriented to person, place, and time.   Psychiatric:         Mood and Affect: Mood normal.             BG/insulin reviewed per EMR.   Glucose, POC (mg/dL)   Date Value   42/59/5638 195 (H)   04/03/2023 151   04/03/2023 157   04/02/2023 145   04/02/2023 134   04/02/2023 138   04/02/2023 137   04/01/2023 174 Summary of labs:  Lab Results   Component Value Date    A1C 4.6 (L) 02/14/2023    A1C 4.7 (L) 12/29/2022    A1C 5.0 11/11/2016     Lab Results   Component Value Date    CREATININE 0.73 04/03/2023     Lab Results   Component Value Date    WBC 1.9 (L) 04/03/2023    HGB 9.9 (L) 04/03/2023    HCT 30.2 (L) 04/03/2023    PLT 97 (L) 04/03/2023       Lab Results   Component Value Date    NA 141 04/03/2023    K 4.5 04/03/2023    CL 108 (H) 04/03/2023    CO2 28.0 04/03/2023    BUN 13 04/03/2023    CREATININE 0.73 04/03/2023    GLU 128 04/03/2023    CALCIUM 9.0 04/03/2023    MG 1.5 (L) 04/03/2023    PHOS 3.6 04/03/2023       Lab Results   Component Value Date    BILITOT 5.3 (H) 04/03/2023    BILIDIR 3.80 (H) 04/03/2023    PROT 6.4 04/03/2023    ALBUMIN 3.5 04/03/2023    ALT 235 (H) 04/03/2023    AST 96 (H) 04/03/2023    ALKPHOS 709 (H) 04/03/2023    GGT 394 (H) 03/31/2023

## 2023-04-03 NOTE — Unmapped (Signed)
[facility]. Down to VIR for biopsy, returned without issue. Voiding spontaneously. Regular diet re-initiated after procedure, pt able to order food. Independent and ambulatory.    Problem: Adult Inpatient Plan of Care  Goal: Plan of Care Review  Outcome: Progressing  Goal: Patient-Specific Goal (Individualized)  Outcome: Progressing  Goal: Absence of Hospital-Acquired Illness or Injury  Outcome: Progressing  Goal: Optimal Comfort and Wellbeing  Outcome: Progressing  Goal: Readiness for Transition of Care  Outcome: Progressing  Goal: Rounds/Family Conference  Outcome: Progressing     Problem: Pain Acute  Goal: Optimal Pain Control and Function  Outcome: Progressing

## 2023-04-03 NOTE — Unmapped (Signed)
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 Vessels patent by doppler. Pending transjugular liver biopsy results.     Interval Events:     No acute events overnight. Underwent TJ liver biopsy yesterday. Now with rising bilirubin. LFTs remain stable. BP elevated overnight with SBP > 170    Plan:     Neuro:   - Pain well controlled   - Gaba, robaxin    CV:   - HDS, Maintain SBP < 180   - start amlodipine 5 for elevation in BP    Pulm:   - Stable on room air  - OOB    GI:   - F: ML   - E: Replete as needed   - N: Regular diet, NPO at midnight w/ sips for meds   - pepcid, zofran, colace, miralax    GU:   - voiding spontaneously  - 8/6 liver US, interval increase in RIs, narrowing of PV anastomosis   - 8/9 VIR liver biopsy, pathology pending     - elevation in bilirubin, will repeat PM labs     - repeat Liver US    Endo:   -glucose checks ACHS    -SSI    Heme/ID:   - Afebrile, WBC and Hgb stable   - ppx with acyclovir, letermovir   - 8/7 CMV, negative   - 8/7 HBsAG, negative    Immuno:   - tac, cellcept, prednisone  - pharmacy recommendations for dosing      Dispo   - Floor      Objective:        Vital Signs:  BP 167/90  - Pulse 58  - Temp 36.6 ??C (97.9 ??F) (Oral)  - Resp 18  - Ht 140.3 cm (4' 7.24)  - Wt 63.6 kg (140 lb 4.8 oz)  - SpO2 100%  - BMI 32.33 kg/m??     Input/Output:  I/O last 3 completed shifts:  In: 0   Out: 1550 [Urine:1550]    Physical Exam:    General: Cooperative, no distress, well appearing female  Pulmonary: Normal work of breathing, on room air  Cardiovascular: Regular rate,  Abdomen: Soft, non-distended.   Musculoskeletal: Moves extremities spontaneously   Neurologic: Alert and interactive, grossly intact    Labs:  Lab Results   Component Value Date    WBC 1.9 (L) 04/03/2023    HGB 9.9 (L) 04/03/2023    HCT 30.2 (L) 04/03/2023    PLT 97 (L) 04/03/2023       Lab Results   Component Value Date    NA 141 04/03/2023    K 4.5 04/03/2023    CL 108 (H) 04/03/2023    CO2 28.0 04/03/2023    BUN 13 04/03/2023    CREATININE 0.73 04/03/2023    CALCIUM 9.0 04/03/2023    MG 1.5 (L) 04/03/2023    PHOS 3.6 04/03/2023       Microbiology Results (last day)       ** No results found for the last 24 hours. **            Imaging:  All pertinent imaging personally reviewed.  8/6 Liver doppler, impression    --Patent hepatic transplant vasculature. Increased resistive indices compared to prior, which are now near or above upper  normal. Additionally, focal  area of vascular narrowing distal to the main portal vein anastomosis with increased velocities in the anterior right and left portal veins.  --Complex appearing collection in the gallbladder fossa, indeterminate may reflect loculated ascites versus cystic duct remnant.  --Increased liver echogenicity, nonspecific.  --Mild hydronephrosis bilaterally. This improves slightly post void.

## 2023-04-03 NOTE — Unmapped (Addendum)
Pt AO x4, NAD   BP up to 178/91, MD notified.   Pt c/o 0/10 pain  900 mL UOP  Abd incision with steristrips, c/d/i   No falls/injuries this shift     Problem: Adult Inpatient Plan of Care  Goal: Plan of Care Review  Outcome: Ongoing - Unchanged  Goal: Patient-Specific Goal (Individualized)  Outcome: Ongoing - Unchanged  Goal: Absence of Hospital-Acquired Illness or Injury  Outcome: Ongoing - Unchanged  Intervention: Identify and Manage Fall Risk  Recent Flowsheet Documentation  Taken 04/02/2023 2000 by Raechel Ache, RN  Safety Interventions:   fall reduction program maintained   low bed   nonskid shoes/slippers when out of bed  Intervention: Prevent Skin Injury  Recent Flowsheet Documentation  Taken 04/02/2023 2000 by Raechel Ache, RN  Positioning for Skin: Supine/Back  Device Skin Pressure Protection: absorbent pad utilized/changed  Skin Protection: adhesive use limited  Intervention: Prevent and Manage VTE (Venous Thromboembolism) Risk  Recent Flowsheet Documentation  Taken 04/02/2023 2000 by Raechel Ache, RN  Anti-Embolism Device Type: SCD, Knee  Anti-Embolism Intervention: Refused  Intervention: Prevent Infection  Recent Flowsheet Documentation  Taken 04/02/2023 2000 by Raechel Ache, RN  Infection Prevention: cohorting utilized  Goal: Optimal Comfort and Wellbeing  Outcome: Ongoing - Unchanged  Goal: Readiness for Transition of Care  Outcome: Ongoing - Unchanged  Goal: Rounds/Family Conference  Outcome: Ongoing - Unchanged     Problem: Fall Injury Risk  Goal: Absence of Fall and Fall-Related Injury  Intervention: Promote Injury-Free Environment  Recent Flowsheet Documentation  Taken 04/02/2023 2000 by Raechel Ache, RN  Safety Interventions:   fall reduction program maintained   low bed   nonskid shoes/slippers when out of bed

## 2023-04-04 LAB — HEPATITIS B SURFACE ANTIBODY
HEPATITIS B SURFACE ANTIBODY QUANT: 51.08 m[IU]/mL — ABNORMAL HIGH (ref <8.00)
HEPATITIS B SURFACE ANTIBODY: REACTIVE — AB

## 2023-04-04 LAB — HEPATIC FUNCTION PANEL
ALBUMIN: 3.8 g/dL (ref 3.4–5.0)
ALKALINE PHOSPHATASE: 1006 U/L — ABNORMAL HIGH (ref 46–116)
ALT (SGPT): 206 U/L — ABNORMAL HIGH (ref 10–49)
AST (SGOT): 78 U/L — ABNORMAL HIGH (ref <=34)
BILIRUBIN DIRECT: 8.6 mg/dL — ABNORMAL HIGH (ref 0.00–0.30)
BILIRUBIN TOTAL: 11 mg/dL — ABNORMAL HIGH (ref 0.3–1.2)
PROTEIN TOTAL: 7.4 g/dL (ref 5.7–8.2)

## 2023-04-04 LAB — BASIC METABOLIC PANEL
ANION GAP: 9 mmol/L (ref 5–14)
BLOOD UREA NITROGEN: 16 mg/dL (ref 9–23)
BUN / CREAT RATIO: 31
CALCIUM: 9.5 mg/dL (ref 8.7–10.4)
CHLORIDE: 105 mmol/L (ref 98–107)
CO2: 22 mmol/L (ref 20.0–31.0)
CREATININE: 0.52 mg/dL — ABNORMAL LOW
EGFR CKD-EPI (2021) FEMALE: >90 mL/min/{1.73_m2} (ref >=60)
GLUCOSE RANDOM: 230 mg/dL — ABNORMAL HIGH (ref 70–179)
POTASSIUM: 4.1 mmol/L (ref 3.4–4.8)
SODIUM: 136 mmol/L (ref 135–145)

## 2023-04-04 LAB — CBC
HEMATOCRIT: 33.5 % — ABNORMAL LOW (ref 34.0–44.0)
HEMOGLOBIN: 10.9 g/dL — ABNORMAL LOW (ref 11.3–14.9)
MEAN CORPUSCULAR HEMOGLOBIN CONC: 32.5 g/dL (ref 32.0–36.0)
MEAN CORPUSCULAR HEMOGLOBIN: 28.9 pg (ref 25.9–32.4)
MEAN CORPUSCULAR VOLUME: 89 fL (ref 77.6–95.7)
MEAN PLATELET VOLUME: 7.6 fL (ref 6.8–10.7)
PLATELET COUNT: 86 10*9/L — ABNORMAL LOW (ref 150–450)
RED BLOOD CELL COUNT: 3.76 10*12/L — ABNORMAL LOW (ref 3.95–5.13)
RED CELL DISTRIBUTION WIDTH: 17.9 % — ABNORMAL HIGH (ref 12.2–15.2)
WBC ADJUSTED: 1.6 10*9/L — ABNORMAL LOW (ref 3.6–11.2)

## 2023-04-04 LAB — HEPATITIS B CORE ANTIBODY, IGM: HEPATITIS B CORE IGM ANTIBODY: NONREACTIVE

## 2023-04-04 LAB — TACROLIMUS LEVEL, TIMED: TACROLIMUS BLOOD: 8.3 ng/mL

## 2023-04-04 LAB — PHOSPHORUS: PHOSPHORUS: 3.2 mg/dL (ref 2.4–5.1)

## 2023-04-04 LAB — MAGNESIUM: MAGNESIUM: 1.9 mg/dL (ref 1.6–2.6)

## 2023-04-04 IMAGING — US IR PARACENTESIS
1 series · 2 of 2 positions shown · non-contrast
Comparison: none

INDICATION: Patient with history of cirrhosis, recurrent ascites. Request made
for therapeutic paracentesis. Patient to receive albumin with
procedure today.

[Series 1: ir (id) (id)/(id)/(id) ir · 2 of 2 slices shown]
[im 1/2]
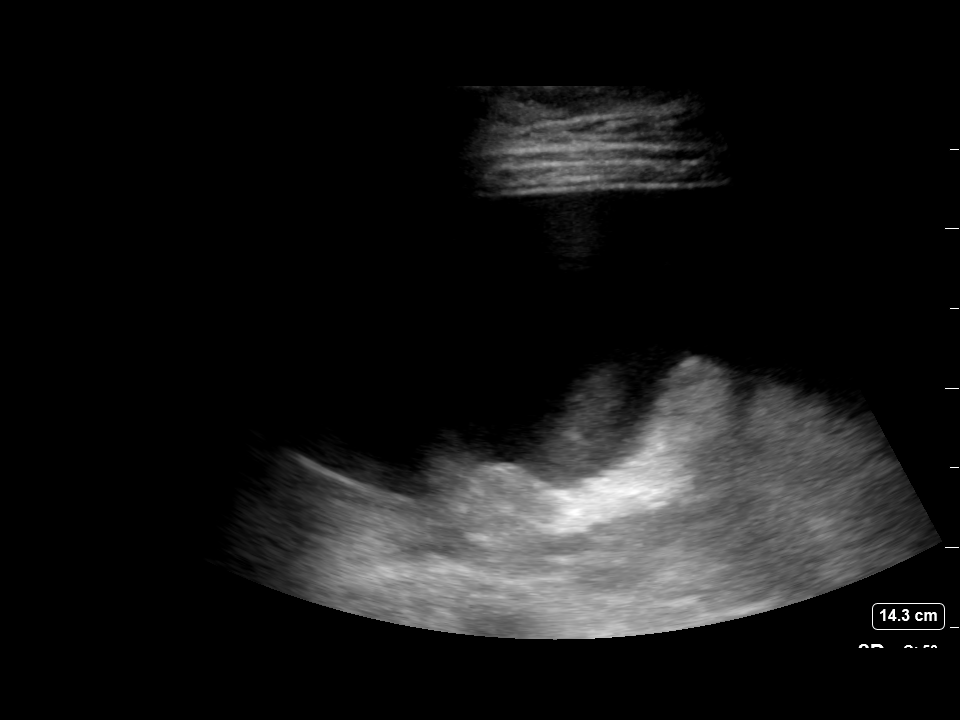
[im 2/2]
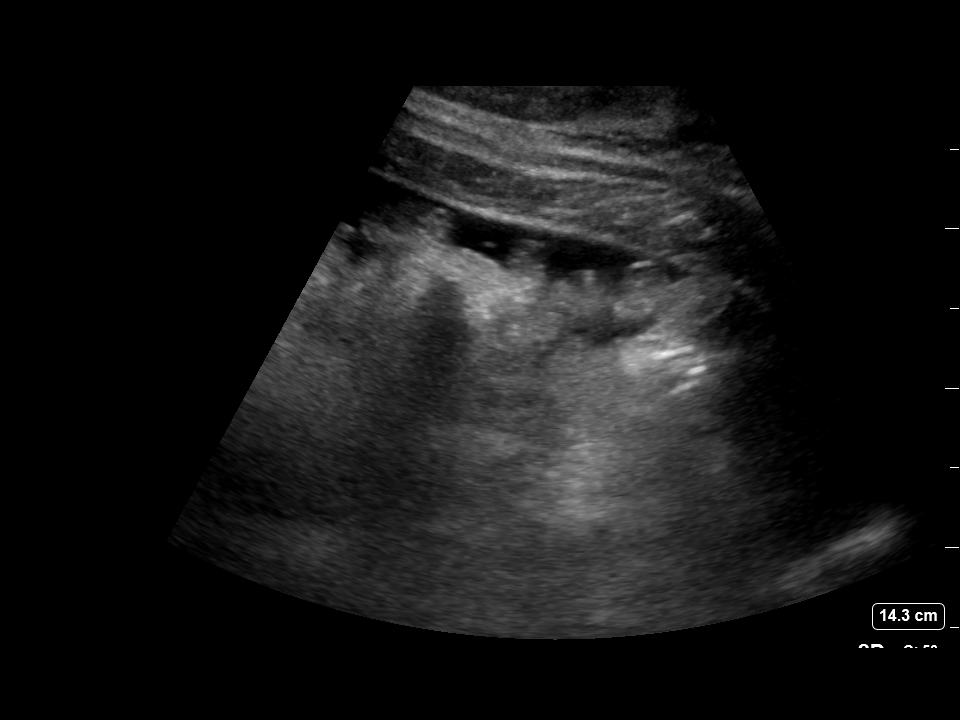

[2 of 2 positions shown; findings below may reference images not displayed]

EXAM:
ULTRASOUND GUIDED THERAPEUTIC PARACENTESIS

MEDICATIONS:
10 mL 1% lidocaine

COMPLICATIONS:
None immediate.

PROCEDURE:
Informed written consent was obtained from the patient after a
discussion of the risks, benefits and alternatives to treatment. A
timeout was performed prior to the initiation of the procedure.

Initial ultrasound scanning demonstrates a large amount of ascites
within the left lateral abdomen. The left lateral abdomen was
prepped and draped in the usual sterile fashion. 1% lidocaine was
used for local anesthesia.

Following this, a 19 gauge, 7-cm, Yueh catheter was introduced. An
ultrasound image was saved for documentation purposes. The
paracentesis was performed. The catheter was removed and a dressing
was applied. The patient tolerated the procedure well without
immediate post procedural complication.
Patient received post-procedure intravenous albumin; see nursing
notes for details.
FINDINGS: A total of approximately 5.2 liters of yellow fluid was removed.
IMPRESSION: Successful ultrasound-guided therapeutic paracentesis yielding
liters of peritoneal fluid.

## 2023-04-04 MED ADMIN — gabapentin (NEURONTIN) capsule 400 mg: 400 mg | ORAL | @ 13:00:00

## 2023-04-04 MED ADMIN — gabapentin (NEURONTIN) capsule 400 mg: 400 mg | ORAL | @ 20:00:00

## 2023-04-04 MED ADMIN — insulin lispro (HumaLOG) injection 0-6 Units: 0-6 [IU] | SUBCUTANEOUS | @ 21:00:00

## 2023-04-04 MED ADMIN — calcium carbonate (TUMS) chewable tablet 200 mg elem calcium: 200 mg | ORAL

## 2023-04-04 MED ADMIN — letermovir (PREVYMIS) tablet 480 mg: 480 mg | ORAL | @ 13:00:00

## 2023-04-04 MED ADMIN — carvedilol (COREG) tablet 3.125 mg: 3.125 mg | ORAL | @ 13:00:00

## 2023-04-04 MED ADMIN — gabapentin (NEURONTIN) capsule 400 mg: 400 mg | ORAL

## 2023-04-04 MED ADMIN — insulin lispro (HumaLOG) injection 0-6 Units: 0-6 [IU] | SUBCUTANEOUS | @ 07:00:00

## 2023-04-04 MED ADMIN — methocarbamol (ROBAXIN) tablet 500 mg: 500 mg | ORAL | @ 16:00:00

## 2023-04-04 MED ADMIN — mycophenolate (CELLCEPT) capsule 250 mg: 250 mg | ORAL | @ 13:00:00

## 2023-04-04 MED ADMIN — insulin lispro (HumaLOG) injection 3 Units: 3 [IU] | SUBCUTANEOUS | @ 13:00:00 | Stop: 2023-04-04

## 2023-04-04 MED ADMIN — gadopiclenol injection 6.3 mL: 6.3 mL | INTRAVENOUS | @ 19:00:00 | Stop: 2023-04-04

## 2023-04-04 MED ADMIN — calcium carbonate (TUMS) chewable tablet 200 mg elem calcium: 200 mg | ORAL | @ 02:00:00

## 2023-04-04 MED ADMIN — insulin lispro (HumaLOG) injection 0-6 Units: 0-6 [IU] | SUBCUTANEOUS | @ 13:00:00

## 2023-04-04 MED ADMIN — insulin lispro (HumaLOG) injection 0-6 Units: 0-6 [IU] | SUBCUTANEOUS | @ 16:00:00

## 2023-04-04 MED ADMIN — methylPREDNISolone sodium succinate (PF) (SOLU-Medrol) 1,000 mg in sodium chloride (NS) 0.9 % 50 mL IVPB: 1000 mg | INTRAVENOUS | @ 13:00:00 | Stop: 2023-04-04

## 2023-04-04 MED ADMIN — sodium chloride (NS) 0.9 % infusion: 75 mL/h | INTRAVENOUS | @ 17:00:00

## 2023-04-04 MED ADMIN — methocarbamol (ROBAXIN) tablet 500 mg: 500 mg | ORAL | @ 02:00:00

## 2023-04-04 MED ADMIN — insulin NPH (HumuLIN,NovoLIN) injection 5 Units: .15 [IU]/kg/d | SUBCUTANEOUS | @ 09:00:00 | Stop: 2023-04-04

## 2023-04-04 MED ADMIN — acetaminophen (TYLENOL) tablet 650 mg: 650 mg | ORAL | @ 21:00:00

## 2023-04-04 MED ADMIN — piperacillin-tazobactam (ZOSYN) 3.375 g in sodium chloride 0.9 % (NS) 100 mL IVPB-MBP: 3.375 g | INTRAVENOUS | @ 22:00:00 | Stop: 2023-04-09

## 2023-04-04 MED ADMIN — piperacillin-tazobactam (ZOSYN) 3.375 g in sodium chloride 0.9 % (NS) 100 mL IVPB-MBP: 3.375 g | INTRAVENOUS | @ 15:00:00 | Stop: 2023-04-09

## 2023-04-04 MED ADMIN — tacrolimus (PROGRAF) capsule 3 mg: 3 mg | ORAL

## 2023-04-04 MED ADMIN — pantoprazole (Protonix) EC tablet 40 mg: 40 mg | ORAL | @ 13:00:00

## 2023-04-04 MED ADMIN — insulin NPH (HumuLIN,NovoLIN) injection 5 Units: 5 [IU] | SUBCUTANEOUS | @ 16:00:00 | Stop: 2023-04-04

## 2023-04-04 MED ADMIN — magnesium oxide-Mg AA chelate (Magnesium Plus Protein) 1 tablet: 1 | ORAL | @ 13:00:00

## 2023-04-04 MED ADMIN — amlodipine (NORVASC) tablet 5 mg: 5 mg | ORAL | @ 13:00:00

## 2023-04-04 MED ADMIN — heparin (porcine) 5,000 unit/mL injection 5,000 Units: 5000 [IU] | SUBCUTANEOUS | @ 02:00:00

## 2023-04-04 MED ADMIN — acyclovir (ZOVIRAX) capsule 400 mg: 400 mg | ORAL

## 2023-04-04 MED ADMIN — insulin lispro (HumaLOG) injection 6 Units: 6 [IU] | SUBCUTANEOUS | @ 21:00:00

## 2023-04-04 MED ADMIN — magnesium oxide-Mg AA chelate (Magnesium Plus Protein) 1 tablet: 1 | ORAL

## 2023-04-04 MED ADMIN — aspirin chewable tablet 81 mg: 81 mg | ORAL | @ 13:00:00

## 2023-04-04 MED ADMIN — methocarbamol (ROBAXIN) tablet 500 mg: 500 mg | ORAL | @ 22:00:00

## 2023-04-04 MED ADMIN — tacrolimus (PROGRAF) capsule 4 mg: 4 mg | ORAL | @ 13:00:00

## 2023-04-04 MED ADMIN — mycophenolate (CELLCEPT) capsule 250 mg: 250 mg | ORAL

## 2023-04-04 MED ADMIN — acetaminophen (TYLENOL) tablet 650 mg: 650 mg | ORAL | @ 09:00:00

## 2023-04-04 MED ADMIN — heparin (porcine) 5,000 unit/mL injection 5,000 Units: 5000 [IU] | SUBCUTANEOUS | @ 09:00:00

## 2023-04-04 MED ADMIN — methocarbamol (ROBAXIN) tablet 500 mg: 500 mg | ORAL | @ 09:00:00

## 2023-04-04 MED ADMIN — acyclovir (ZOVIRAX) capsule 400 mg: 400 mg | ORAL | @ 13:00:00

## 2023-04-04 NOTE — Unmapped (Signed)
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 Vessels patent by doppler. Pending transjugular liver biopsy results.     Interval Events:     No acute events overnight. Elevated Bps with SBP > 180. Bilirubin continues to rise, will obtain STAT MRI today.     Plan:     Neuro:   - Pain well controlled   - Gaba, robaxin    CV:   - HDS, Maintain SBP < 180   - continue amlodipine 5, coreg 3.125 BID     Pulm:   - Stable on room air  - OOB    GI:   - F: ML   - E: Replete as needed   - N: Regular diet, NPO at midnight w/ sips for meds   - pepcid, zofran, colace, miralax    GU:   - voiding spontaneously  - 8/6 liver US, interval increase in RIs, narrowing of PV anastomosis   - 8/9 VIR liver biopsy, pathology preliminary showing mild ACR     - Steroid taper started 8/10    - elevation in bilirubin,     - Liver US with patent vasculature     - STAT MRCP today     Endo:   -endocrine managing with on high dose steroids    - NPH, Lispro and correctional        Heme/ID:   - Afebrile, WBC and Hgb stable   - ppx with acyclovir, letermovir   - 8/7 CMV, negative   - 8/7 HBsAG, negative   - Start Zosyn for possible cholangitis     Immuno:   - tac, cellcept, prednisone  - pharmacy recommendations for dosing      Dispo   - Floor      Objective:        Vital Signs:  BP 163/95  - Pulse 65  - Temp 36.7 ??C (98.1 ??F) (Oral)  - Resp 18  - Ht 140.3 cm (4' 7.24)  - Wt 63.6 kg (140 lb 4.8 oz)  - SpO2 99%  - BMI 32.33 kg/m??     Input/Output:  I/O last 3 completed shifts:  In: 1423 [P.O.:1350; IV Piggyback:73]  Out: 2600 [Urine:2600]    Physical Exam:    General: Cooperative, no distress, well appearing female  Pulmonary: Normal work of breathing, on room air  Cardiovascular: Regular rate,  Abdomen: Soft, non-distended.   Musculoskeletal: Moves extremities spontaneously Neurologic: Alert and interactive, grossly intact    Labs:  Lab Results   Component Value Date    WBC 1.6 (L) 04/04/2023    HGB 10.9 (L) 04/04/2023    HCT 33.5 (L) 04/04/2023    PLT 86 (L) 04/04/2023       Lab Results   Component Value Date    NA 136 04/04/2023    K 4.1 04/04/2023    CL 105 04/04/2023    CO2 22.0 04/04/2023    BUN 16 04/04/2023    CREATININE 0.52 (L) 04/04/2023    CALCIUM 9.5 04/04/2023    MG 1.9 04/04/2023    PHOS 3.2 04/04/2023       Microbiology Results (last day)       ** No results found for the last 24 hours. **            Imaging:  All pertinent imaging personally reviewed.    ECG 12 Lead    Result Date: 04/04/2023  NORMAL SINUS RHYTHM NORMAL ECG WHEN COMPARED WITH ECG OF 28-Feb-2023 02:08, NO SIGNIFICANT CHANGE WAS FOUND Confirmed by Christella Noa ([id number]) on 04/04/2023 10:10:52 AM    US Liver Transplant    Result Date: 04/03/2023  EXAM: US LIVER TRANSPLANT ACCESSION: [id number]     CLINICAL INDICATION: 47 years old with acute elevation in bilirubin      COMPARISON: Liver transplant ultrasound 03/30/2023, other prior     TECHNIQUE: Ultrasound views of the complete abdomen were obtained using gray scale and color and spectral Doppler imaging.     FINDINGS:     HEPATOBILIARY: The liver is increased in echogenicity. No focal hepatic lesions. Prominence of the left hepatic duct measuring up to 0.5 cm. The common bile duct is normal in caliber. The gallbladder is surgically absent. Redemonstrated heterogenous collection within the gallbladder fossa measuring approximately 8.0 x 1.5 x 3.7 cm with internal septations. This previously measured 5.8 x 2.5 x 3.2 cm.      Liver:  16.23   cm      Common bile duct:   0.61  cm     PANCREAS: Visualized portion is unremarkable.     SPLEEN: Splenomegaly with normal echotexture.       Spleen:   18.22  cm     KIDNEYS: Normal in size and echotexture. No solid masses or calculi. Bilateral, left greater than right pelviectasis.      Right kidney:  12.06   cm Left kidney:  10.17   cm     VESSELS: - Portal vein: The main, left and right portal veins are patent with hepatopetal flow. Normal main portal vein velocity (0.20 m/s or greater)      Main portal vein diameter:  0.98   cm      Main portal vein pre anastomosis velocity:   0.48  m/s, previously 0.56 m/s      Main portal vein anastomosis velocity:  1.14   m/s, previously 0.73 m/s      Main portal vein post anastomosis velocity:   1.01  m/s, previously 0.82 m/s      Anterior right portal vein velocity:  1.41   m/s      Posterior right portal vein velocity:  0.46   m/s      Left portal vein velocity:   0.49  m/s      Right portal vein flow: hepatopetal      Left portal vein flow: hepatopetal     - Splenic vein: Patent, with hepatopetal flow.      Splenic vein midline: hepatopetal      Splenic vein proximal: hepatopetal     - Hepatic veins/IVC: The IVC, left, middle and right hepatic veins are were patent.      Left hepatic vein phasicity/flow: triphasic      Middle hepatic vein phasicity/flow: triphasic      Right hepatic vein phasicity/flow: triphasic      Inferior vena cava phasicity/flow: triphasic     - Hepatic artery: Patent with resistive indices increased in the common hepatic artery, now mildly elevated      Common hepatic artery resistive index:   0.86 (previously 0.81) and systolic acceleration time  40   msec      Right hepatic artery resistive index: 0.71 (0.78) and systolic acceleration time  30   msec      Left hepatic artery resistive  index: 0.76 (0.71) and systolic acceleration time  40   msec     - Visualized proximal aorta:  unremarkable     OTHER: No ascites.         --Patent hepatic transplant vasculature. --Slight increase in the common hepatic artery resistive index, now mildly elevated. The right and left hepatic artery resistive indices are within normal limits. --Slight increased velocities in the portal vein anastomotic site when compared with 03/30/2023, with similar doubling of the velocity across the anastomosis. --Redemonstrated loculated collection in the gallbladder fossa which may reflect loculated ascites versus cystic duct remnant. --Increased hepatic echogenicity, similar to prior. --Redemonstrated bilateral hydronephrosis. --Splenomegaly.                     Please see below for data measurements:         Liver:  16.23   cm     Common bile duct:   0.61  cm     Right kidney:  12.06   cm Left kidney:  10.17   cm     Main portal vein diameter:  0.98   cm     Main portal vein pre anastomosis velocity:   0.48  m/s Main portal vein anastomosis velocity:  1.14   m/s Main portal vein post anastomosis velocity:   1.01  m/s Anterior right portal vein velocity:  1.41   m/s Posterior right portal vein velocity:  0.46   m/s Left portal vein velocity:   0.49  m/s     Right portal vein flow: hepatopetal Left portal vein flow: hepatopetal     Common hepatic artery resistive index:  0.86 (previously 0.81) and systolic acceleration time  40   msec Right hepatic artery resistive index: 0.71 and systolic acceleration time  30   msec Left hepatic artery resistive index: 0.76 and systolic acceleration time  40   msec     Left hepatic vein flow: triphasic Middle hepatic vein flow: triphasic Right hepatic vein flow: triphasic Inferior vena cava flow: triphasic     Splenic vein midline: hepatopetal Splenic vein proximal: hepatopetal     Aorta: partially visualized Inferior vena cava: Partially visualized     Spleen:   18.22  cm     Abdominal free fluid visualized: yes

## 2023-04-04 NOTE — Unmapped (Signed)
INPATIENT HEPATOLOGY ATTENDING CONSULT NOTE    Alicia Armstrong is a 47 y.o. female with AIH/MASH cirrohsis s/p OLT 01/2023 who presented with elevated liver enzymes.    Graft function: initially with mild alk phos elevation (155) on 03/25/23; subsequently developed mixed hepatocellular/cholestatic liver injury (alk phos 300-400, ALT 200-300). Bilirubin became mildly elevated (to 1.5) and then after liver biopsy on 04/02/23 (transjugular approach) her bilirubin has risen dramatically to 11; with alk phos upto 1000.  MRCP is a poor study due to motion artifact but suggestive of possible irregularity at CBD and in left hepatic duct.  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  - Agree with IV solumedrol as below for rejection  - Would also recommend ERCP to better evaluate biliary tree (concern for inadvertent left duct damage during VIR liver biopsy)  Immunosuppression:   - Continue tacrolimus, goal as per transplant surgery  - On mycophenolate 250mg  BID but given significant leukopenia could consider holding this  - Started IV solumedrol 1g on 04/03/23 for presumptive T-cell mediated rejection of liver  Prophylaxis: acyclovir, letermovir (off pentamidine due to leukopenia)    --------------------------------------    HPI: Alicia Armstrong is a 47 year old female with a PMH of AIH/MASH cirrhosis who underwent OLT on 02/15/23 (DBD, D2D, CMV-/+) who presents for elevated liver enzymes.  Post-operative course was largely uncomplicated with exception of following:  7/6: developed a CHA stenosis and was placed on lovenox.  7/15: cytopenia, required GCSF; valcyte changed to letermovir    On this admission, her labs are shown below. She initially had a mild alk phos elevatoin (03/25/23) which then progressed to a mixed hepatocellular/cholestatic liver injury. She underwent liver biopsy 04/02/23 (at which time her bilirubin was 1.5) but since the biopsy it has risen dramatically to 11, with alk phos to 1000.    She denies any significant abdominal pain, vomiting, pruritus.  She has been constipated lately so she denies hematochezia.    Objective:   Physical Exam   Vital Signs: BP 163/95  - Pulse 65  - Temp 36.7 ??C (98.1 ??F) (Oral)  - Resp 18  - Ht 140.3 cm (4' 7.24)  - Wt 63.6 kg (140 lb 4.8 oz)  - SpO2 99%  - BMI 32.33 kg/m??   Constitutional: She is in no apparent distress  Eyes: Anicteric sclerae  Cardiovascular: No peripheral edema  Gastrointestinal: Soft, nontender abdomen  Neurologic: Alert and oriented to person, place and time    Data:  MRI MRCP 8/11: loculated collection in GB fossa may reflect biloma; focal irregularity at CBD anastomosis with poor visualization of duct; motion artifact limits assessment of bile leak    Labs 04/04/23: wbc 1.6, hgb 10.9, plt 86, cr 0.52, bilirubin 11, ast 78, alt 206, alk phos 1006    Labs 04/02/23: bilirubin 1.5, ast 155, alt 280, alk phos 561    US liver transplant 03/31/23: patent vasculature, with increased resistive indices compared to prior and additional focal area of vascular narrowing distal to main PV anastomosis    Labs 03/29/23: bilirubin 0.6, ast 183, alt 263, alk phos 372

## 2023-04-04 NOTE — Unmapped (Signed)
Endocrine Team Diabetes Follow Up Consult Note     Consult information:  Requesting Attending Physician : Gemma Payor, MD  Service Requesting Consult : Surg Transplant [phone])  Primary Care Provider: Doris Cheadle, MD  Impression:  Alicia Armstrong is a 47 y.o. 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 We have been consulted at the request of Gemma Payor, MD to evaluate Alicia Armstrong for hyperglycemia.     Medical Decision Making:  Diagnoses:  1. History of steroid induced-diabetes with plans for initiation of high-dose steroid  With severe hyperglycemia last 24 hours.  2. Nutrition: Complicating glycemic control. Increasing risk for both hypoglycemia and hyperglycemia.  3. Transplant. Complicating glycemic control and increasing risk for hyperglycemia.  4. Steroids. Complicating glycemic control and increasing risk for hyperglycemia.      Studies reviewed 04/04/23:  Labs: CBC, BMP, POCT-BG, and HbA1C  Interpretation: Leukopenia noted. Anemia noted. Normal sodium. Hyperglycemia, some severe. A1C in non-diabetes range elevate LFTs with concern for rejection .  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 Blood sugars initially well controlled without insulin. She was started on a conservative dose of insulin yesterday in anticipation of steroid induced hyperglycemia. Noted to have intermittent hyperglycemia since starting solumedrol 1 mg last night. AM BG severely elevated to 314 mg/dL. Will double basal and nutritional coverage this AM. We will continue to monitor closely.     Recommendations:  - NPH 10 units q12 hours   - Lispro 6 units TID AC  - Lispro correction factor 30 5x daily  (ACHS + 3am)  - POCT-BG 5 times a day.  - Hypoglycemia protocol.  - Ensure patient is on glucose precautions if patient taking nutrition by mouth.     Discharge planning:  In process. Will complete closer to discharge.Anticipate being able to discharge off antihyperglycemic agents.     Thank you for this consult. Discussed plan with primary team. We will continue to follow and make recommendations and place orders as appropriate.    Please page with questions or concerns: [place], [place]: [phone]  Endocrinology Diabetes Care Team on call from 6AM - 3PM on weekdays then endocrine fellow on call: [phone] from 3PM - 6AM on weekdays and on weekends and holidays.   If APP cannot be reached, please page the endocrine fellow on call.      Subjective:  Interval History:  NAEO. Patient NPO today for MRI. Remains on high dose steroids. She reports feeling OK. C/o abdominal pain with movement. Tolerating a regular diet. Denies N/V. Husband at the bedside.     Initial HPI:  Alicia Armstrong is a 48 y.o. 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 We have been consulted at the request of Gemma Payor, MD to evaluate Alicia Armstrong for presumptive hyperglycemia in the setting of high dose steroids. After surgery, she required insulin during high dose steroids - though she has no history of diabetes prior to transplant. Glucose during current hospital stay have been in the mid 100s requiring minimal sliding scale insulin.     Diabetes History:  Patient has a history of  steroid induced hyperglycemia  diagnosed during last hospitalization.  Diabetes is managed by: n/a.  Current home diabetes regimen: none.  Current home  blood glucose monitoring: 2x a day, blood sugars running in the low 100s.  Hypoglycemia awareness: n/a.  Complications related to diabetes: none known      Current Nutrition:  Active Orders   Diet    Nutrition Therapy Regular/House       ROS: As per HPI.     acyclovir  400 mg Oral BID    amlodipine  5 mg Oral Daily    aspirin  81 mg Oral Daily    gabapentin  400 mg Oral TID    heparin (porcine) for subcutaneous use  5,000 Units Subcutaneous Q8H SCH    insulin lispro  0-6 Units Subcutaneous 5XD insulin    insulin lispro  3 Units Subcutaneous TID AC    insulin NPH  0.15 Units/kg/day Subcutaneous Q12H    letermovir  480 mg Oral Daily    magnesium oxide-Mg AA chelate  1 tablet Oral BID    methocarbamol  500 mg Oral QID    methylPREDNISolone sodium succinate  1,000 mg Intravenous Daily    Followed by    Melene Muller ON 04/05/2023] methylPREDNISolone sodium succinate  100 mg Intravenous Q12H    Followed by    Melene Muller ON 04/06/2023] methylPREDNISolone sodium succinate  80 mg Intravenous Q12H    Followed by    Melene Muller ON 04/07/2023] methylPREDNISolone sodium succinate  60 mg Intravenous Q12H    Followed by    Melene Muller ON 04/08/2023] predniSONE  40 mg Oral BID    Followed by    Melene Muller ON 04/09/2023] predniSONE  20 mg Oral BID    Followed by    Melene Muller ON 04/10/2023] predniSONE  20 mg Oral Daily    mycophenolate  250 mg Oral BID    pantoprazole  40 mg Oral Daily    tacrolimus  4 mg Oral Daily    And    tacrolimus  3 mg Oral Daily       Current Outpatient Medications   Medication Instructions    acetaminophen (TYLENOL) 325-650 mg, Oral, Every 8 hours PRN    acyclovir (ZOVIRAX) 400 mg, Oral, 2 times a day (standard)    aspirin (ECOTRIN) 81 mg, Oral, Daily (standard)    calcium carbonate (TUMS) 200 mg calcium (500 mg) chewable tablet 1 tablet, Oral, 3 times a day (standard)    docusate sodium (COLACE) 100 mg, Oral, 2 times a day PRN    enoxaparin (LOVENOX) 40 mg, Subcutaneous, Every 12 hours    gabapentin (NEURONTIN) 400 mg, Oral, 3 times a day (standard)    lancets Misc Use to check blood sugar as directed with insulin 3 times a day & for symptoms of high or low blood sugar.    letermovir (PREVYMIS) 480 mg, Oral, Daily (standard)    lidocaine 4 % patch Place 1 patch on skin around the incision for 12 hours nightly, then remove for 12 hours. Do not place on open skin or the incision directly.    magnesium oxide-Mg AA chelate (MAGNESIUM, AMINO ACID CHELATE,) 133 mg 1 tablet, Oral, 2 times a day (standard)    methocarbamol (ROBAXIN) 500 mg, Oral, 4 times a day    mycophenolate (CELLCEPT) 250 mg, Oral, 2 times a day (standard)    pantoprazole (PROTONIX) 40 mg, Oral, Daily (standard)    pen needle, diabetic 32 gauge x 5/32 (4 mm) Ndle Use with insulin up to 4 times/day as needed.    pentamidine in sterile water 300 mg, Inhalation, Every 28 days    polyethylene glycol (MIRALAX)  17 gram packet Mix 1 packet in  4 to 8 ounces of liquid and drink daily as needed.    predniSONE (DELTASONE) 5 mg, Oral, Daily (standard)    SITagliptin phosphate (JANUVIA) 100 mg, Oral, Daily (standard)    tacrolimus (PROGRAF) 1 MG capsule Take 4 capsules (4 mg total) by mouth daily AND 3 capsules (3 mg total) nightly.           Past Medical History:   Diagnosis Date    Cirrhosis (CMS-HCC)        Past Surgical History:   Procedure Laterality Date    CESAREAN SECTION      PR COLSC FLX W/RMVL OF TUMOR POLYP LESION SNARE TQ N/A 09/11/2022    Procedure: COLONOSCOPY FLEX; W/REMOV TUMOR/LES BY SNARE;  Surgeon: Mickie Hillier, MD;  [facility]: [facility];  Service: Gastroenterology    PR REMV VEIN CLOT CAVA-ILIAC,LEG INCIS N/A 02/14/2023    Procedure: THROMBEC DIRECT OR W/CATH; VENA CAVA BY LEG INCS;  Surgeon: Florene Glen, MD;  Location: MAIN OR [place];  Service: Transplant    PR TRANSPLANT LIVER,ALLOTRANSPLANT Bilateral 02/14/2023    Procedure: LIVER ALLOTRANSPLANTATION; ORTHOTOPIC, PARTIAL OR WHOLE, FROM CADAVER OR LIVING DONOR, ANY AGE;  Surgeon: Florene Glen, MD;  Location: MAIN OR [place];  Service: Transplant    PR TRANSPLANT,PREP DONOR LIVER, WHOLE N/A 02/14/2023    Procedure: [facility] STD PREP CAD DONOR WHOLE LIVER GFT PRIOR TNSPLNT,INC CHOLE,DISS/REM SURR TISSU WO TRISEG/LOBE SPLT;  Surgeon: Florene Glen, MD;  Location: MAIN OR ;  Service: Transplant    PR UPPER GI ENDOSCOPY,DIAGNOSIS N/A 04/02/2017    Procedure: UGI ENDO, INCLUDE ESOPHAGUS, STOMACH, & DUODENUM &/OR JEJUNUM; DX W/WO COLLECTION SPECIMN, BY BRUSH OR WASH;  Surgeon: Janyth Pupa, MD;  [facility]: [facility];  Service: Gastroenterology    PR UPPER GI ENDOSCOPY,DIAGNOSIS N/A 09/11/2022    Procedure: UGI ENDO, INCLUDE ESOPHAGUS, STOMACH, & DUODENUM &/OR JEJUNUM; DX W/WO COLLECTION SPECIMN, BY BRUSH OR WASH;  Surgeon: Mickie Hillier, MD;  [facility];  Service: Gastroenterology       Family History   Problem Relation Age of Onset    No Known Problems Mother     Diabetes Father     No Known Problems Sister     No Known Problems Daughter     No Known Problems Maternal Grandmother     No Known Problems Maternal Grandfather     No Known Problems Paternal Grandmother     No Known Problems Paternal Grandfather     BRCA 1/2 Neg Hx     Breast cancer Neg Hx     Cancer Neg Hx     Colon cancer Neg Hx     Endometrial cancer Neg Hx     Ovarian cancer Neg Hx        Social History     Tobacco Use    Smoking status: Never    Smokeless tobacco: Never   Vaping Use    Vaping status: Never Used   Substance Use Topics    Alcohol use: No     Alcohol/week: 0.0 standard drinks of alcohol    Drug use: No       OBJECTIVE:  BP 179/92  - Pulse 73  - Temp 36.6 ??C (97.9 ??F) (Oral)  - Resp 18  - Ht 140.3 cm (4' 7.24)  - Wt 63.6 kg (140 lb 4.8 oz)  - SpO2 100%  - BMI 32.33 kg/m??  Wt Readings from Last 12 Encounters:   03/31/23 63.6 kg (140 lb 4.8 oz)   03/22/23 63.6 kg (140 lb 4.8 oz)   03/22/23 63.6 kg (140 lb 4.8 oz)   03/10/23 74.2 kg (163 lb 9.3 oz)   03/08/23 72.6 kg (160 lb)   03/01/23 77.2 kg (170 lb 1.6 oz)   02/21/23 75.6 kg (166 lb 10.7 oz)   02/06/23 77.9 kg (171 lb 11.2 oz)   02/02/23 86.3 kg (190 lb 3.2 oz)   01/15/23 76.2 kg (168 lb)   12/29/22 73.4 kg (161 lb 13.1 oz)   12/16/22 74.3 kg (163 lb 11.2 oz)     Physical Exam  Vitals and nursing note reviewed.   Constitutional:       General: She is not in acute distress.     Appearance: Normal appearance. She is ill-appearing (chronically).   Eyes:      General: Scleral icterus present.   Pulmonary:      Effort: Pulmonary effort is normal. No respiratory distress.   Abdominal:      General: There is no distension.      Palpations: Abdomen is soft.      Tenderness: There is no abdominal tenderness.   Skin:     General: Skin is warm and dry.   Neurological:      General: No focal deficit present.      Mental Status: She is alert and oriented to person, place, and time.   Psychiatric:         Mood and Affect: Mood normal.         BG/insulin reviewed per EMR.   Glucose, POC (mg/dL)   Date Value   81/19/1478 251 (H)   04/03/2023 152   04/03/2023 195 (H)   04/03/2023 151   04/03/2023 157   04/02/2023 145   04/02/2023 134   04/02/2023 138        Summary of labs:  Lab Results   Component Value Date    A1C 4.6 (L) 02/14/2023    A1C 4.7 (L) 12/29/2022    A1C 5.0 11/11/2016     Lab Results   Component Value Date    CREATININE 0.52 (L) 04/04/2023     Lab Results   Component Value Date    WBC 1.6 (L) 04/04/2023    HGB 10.9 (L) 04/04/2023    HCT 33.5 (L) 04/04/2023    PLT 86 (L) 04/04/2023       Lab Results   Component Value Date    NA 136 04/04/2023    K 4.1 04/04/2023    CL 105 04/04/2023    CO2 22.0 04/04/2023    BUN 16 04/04/2023    CREATININE 0.52 (L) 04/04/2023    GLU 230 (H) 04/04/2023    CALCIUM 9.5 04/04/2023    MG 1.9 04/04/2023    PHOS 3.2 04/04/2023       Lab Results   Component Value Date    BILITOT 11.0 (H) 04/04/2023    BILIDIR 8.60 (H) 04/04/2023    PROT 7.4 04/04/2023    ALBUMIN 3.8 04/04/2023    ALT 206 (H) 04/04/2023    AST 78 (H) 04/04/2023    ALKPHOS 1,006 (H) 04/04/2023    GGT 394 (H) 03/31/2023

## 2023-04-04 NOTE — Unmapped (Signed)
Pt is independent on RA, Voiding appropriately. C/o back pain, refused Tylenol when offered. Pt states she has a general unwell feeling, weakness, chills, and warmth in her chest. MD was notified and assessed Pt at bedside. Prn tums and stat EKG performed. Pt stated she felt a little better . Husband at bedside. Blood glucose monitored.  Safety measures in place. Will continue to monitor.   Problem: Adult Inpatient Plan of Care  Goal: Plan of Care Review  Outcome: Progressing  Goal: Patient-Specific Goal (Individualized)  Outcome: Progressing  Goal: Absence of Hospital-Acquired Illness or Injury  Outcome: Progressing  Intervention: Identify and Manage Fall Risk  Recent Flowsheet Documentation  Taken 04/04/2023 0400 by Gwenlyn Saran, RN  Safety Interventions:   fall reduction program maintained   family at bedside   low bed  Taken 04/04/2023 0200 by Gwenlyn Saran, RN  Safety Interventions:   fall reduction program maintained   family at bedside   low bed  Taken 04/04/2023 0000 by Gwenlyn Saran, RN  Safety Interventions:   fall reduction program maintained   low bed   family at bedside  Taken 04/03/2023 2200 by Gwenlyn Saran, RN  Safety Interventions:   fall reduction program maintained   family at bedside   low bed  Taken 04/03/2023 2000 by Gwenlyn Saran, RN  Safety Interventions:   fall reduction program maintained   family at bedside   low bed  Intervention: Prevent Skin Injury  Recent Flowsheet Documentation  Taken 04/03/2023 2000 by Gwenlyn Saran, RN  Positioning for Skin: Supine/Back  Intervention: Prevent and Manage VTE (Venous Thromboembolism) Risk  Recent Flowsheet Documentation  Taken 04/04/2023 0400 by Gwenlyn Saran, RN  Anti-Embolism Device Type: SCD, Knee  Anti-Embolism Intervention: Refused  Anti-Embolism Device Location: BLE  Taken 04/04/2023 0200 by Gwenlyn Saran, RN  Anti-Embolism Device Type: SCD, Knee  Anti-Embolism Intervention: Refused  Anti-Embolism Device Location: BLE  Taken 04/04/2023 0000 by Gwenlyn Saran, RN  Anti-Embolism Device Type: SCD, Knee  Anti-Embolism Intervention: Refused  Anti-Embolism Device Location: BLE  Taken 04/03/2023 2200 by Gwenlyn Saran, RN  Anti-Embolism Device Type: SCD, Knee  Anti-Embolism Intervention: Refused  Anti-Embolism Device Location: BLE  Taken 04/03/2023 2000 by Gwenlyn Saran, RN  Anti-Embolism Device Type: SCD, Knee  Anti-Embolism Intervention: Refused  Anti-Embolism Device Location: BLE  Goal: Optimal Comfort and Wellbeing  Outcome: Progressing  Goal: Readiness for Transition of Care  Outcome: Progressing  Goal: Rounds/Family Conference  Outcome: Progressing     Problem: Fall Injury Risk  Goal: Absence of Fall and Fall-Related Injury  Intervention: Promote Injury-Free Environment  Recent Flowsheet Documentation  Taken 04/04/2023 0400 by Gwenlyn Saran, RN  Safety Interventions:   fall reduction program maintained   family at bedside   low bed  Taken 04/04/2023 0200 by Gwenlyn Saran, RN  Safety Interventions:   fall reduction program maintained   family at bedside   low bed  Taken 04/04/2023 0000 by Gwenlyn Saran, RN  Safety Interventions:   fall reduction program maintained   low bed   family at bedside  Taken 04/03/2023 2200 by Gwenlyn Saran, RN  Safety Interventions:   fall reduction program maintained   family at bedside   low bed  Taken 04/03/2023 2000 by Gwenlyn Saran, RN  Safety Interventions:   fall reduction program maintained   family at bedside   low bed     Problem: Wound  Goal: Optimal Functional  Ability  Intervention: Optimize Functional Ability  Recent Flowsheet Documentation  Taken 04/03/2023 2000 by Gwenlyn Saran, RN  Activity Management: ambulated in room  Goal: Skin Health and Integrity  Intervention: Optimize Skin Protection  Recent Flowsheet Documentation  Taken 04/03/2023 2000 by Gwenlyn Saran, RN  Activity Management: ambulated in room  Pressure Reduction Techniques: frequent weight shift encouraged  Head of Bed (HOB) Positioning: HOB at 30 degrees  Pressure Reduction Devices: pressure-redistributing mattress utilized     Problem: Pain Acute  Goal: Optimal Pain Control and Function  Outcome: Progressing

## 2023-04-04 NOTE — Unmapped (Signed)
Paged VIR fellow on call to discuss acutely elevating total bilirubin. Discussed no identifiable complications were noted during the procedure. I asked for them to continue following the patient given the acute changes after the procedure. They recommended obtaining a MRCP for further evaluation and trending labs.     Billey Chang, MD  General Surgery, PGY-3

## 2023-04-05 DIAGNOSIS — Z944 Liver transplant status: Principal | ICD-10-CM

## 2023-04-05 LAB — BASIC METABOLIC PANEL
ANION GAP: 12 mmol/L (ref 5–14)
BLOOD UREA NITROGEN: 17 mg/dL (ref 9–23)
BUN / CREAT RATIO: 28
CALCIUM: 9.2 mg/dL (ref 8.7–10.4)
CHLORIDE: 109 mmol/L — ABNORMAL HIGH (ref 98–107)
CO2: 22 mmol/L (ref 20.0–31.0)
CREATININE: 0.6 mg/dL
EGFR CKD-EPI (2021) FEMALE: >90 mL/min/{1.73_m2} (ref >=60)
GLUCOSE RANDOM: 176 mg/dL (ref 70–179)
POTASSIUM: 3.5 mmol/L (ref 3.4–4.8)
SODIUM: 143 mmol/L (ref 135–145)

## 2023-04-05 LAB — HEPATIC FUNCTION PANEL
ALBUMIN: 3.6 g/dL (ref 3.4–5.0)
ALKALINE PHOSPHATASE: 920 U/L — ABNORMAL HIGH (ref 46–116)
ALT (SGPT): 149 U/L — ABNORMAL HIGH (ref 10–49)
AST (SGOT): 40 U/L — ABNORMAL HIGH (ref <=34)
BILIRUBIN DIRECT: 5.9 mg/dL — ABNORMAL HIGH (ref 0.00–0.30)
BILIRUBIN TOTAL: 8.1 mg/dL — ABNORMAL HIGH (ref 0.3–1.2)
PROTEIN TOTAL: 6.5 g/dL (ref 5.7–8.2)

## 2023-04-05 LAB — MAGNESIUM: MAGNESIUM: 1.5 mg/dL — ABNORMAL LOW (ref 1.6–2.6)

## 2023-04-05 LAB — CBC
HEMATOCRIT: 29.6 % — ABNORMAL LOW (ref 34.0–44.0)
HEMOGLOBIN: 9.7 g/dL — ABNORMAL LOW (ref 11.3–14.9)
MEAN CORPUSCULAR HEMOGLOBIN CONC: 32.8 g/dL (ref 32.0–36.0)
MEAN CORPUSCULAR HEMOGLOBIN: 28.5 pg (ref 25.9–32.4)
MEAN CORPUSCULAR VOLUME: 86.8 fL (ref 77.6–95.7)
MEAN PLATELET VOLUME: 8.1 fL (ref 6.8–10.7)
PLATELET COUNT: 95 10*9/L — ABNORMAL LOW (ref 150–450)
RED BLOOD CELL COUNT: 3.41 10*12/L — ABNORMAL LOW (ref 3.95–5.13)
RED CELL DISTRIBUTION WIDTH: 18.5 % — ABNORMAL HIGH (ref 12.2–15.2)
WBC ADJUSTED: 3.7 10*9/L (ref 3.6–11.2)

## 2023-04-05 LAB — TACROLIMUS LEVEL, TIMED: TACROLIMUS BLOOD: 6.6 ng/mL

## 2023-04-05 LAB — PHOSPHORUS: PHOSPHORUS: 3 mg/dL (ref 2.4–5.1)

## 2023-04-05 MED ADMIN — insulin NPH (HumuLIN,NovoLIN) injection 10 Units: 10 [IU] | SUBCUTANEOUS

## 2023-04-05 MED ADMIN — piperacillin-tazobactam (ZOSYN) 3.375 g in sodium chloride 0.9 % (NS) 100 mL IVPB-MBP: 3.375 g | INTRAVENOUS | @ 22:00:00 | Stop: 2023-04-09

## 2023-04-05 MED ADMIN — acetaminophen (TYLENOL) tablet 650 mg: 650 mg | ORAL | @ 04:00:00

## 2023-04-05 MED ADMIN — gabapentin (NEURONTIN) capsule 400 mg: 400 mg | ORAL | @ 12:00:00

## 2023-04-05 MED ADMIN — mycophenolate (CELLCEPT) capsule 250 mg: 250 mg | ORAL | @ 12:00:00

## 2023-04-05 MED ADMIN — tacrolimus (PROGRAF) capsule 3 mg: 3 mg | ORAL

## 2023-04-05 MED ADMIN — sodium chloride (NS) 0.9 % infusion: 75 mL/h | INTRAVENOUS | @ 03:00:00

## 2023-04-05 MED ADMIN — mycophenolate (CELLCEPT) capsule 250 mg: 250 mg | ORAL

## 2023-04-05 MED ADMIN — pantoprazole (Protonix) EC tablet 40 mg: 40 mg | ORAL | @ 12:00:00

## 2023-04-05 MED ADMIN — magnesium sulfate 2gm/50mL IVPB: 2 g | INTRAVENOUS | @ 18:00:00 | Stop: 2023-04-05

## 2023-04-05 MED ADMIN — letermovir (PREVYMIS) tablet 480 mg: 480 mg | ORAL | @ 12:00:00

## 2023-04-05 MED ADMIN — gabapentin (NEURONTIN) capsule 400 mg: 400 mg | ORAL | @ 17:00:00

## 2023-04-05 MED ADMIN — methocarbamol (ROBAXIN) tablet 500 mg: 500 mg | ORAL

## 2023-04-05 MED ADMIN — amlodipine (NORVASC) tablet 5 mg: 5 mg | ORAL | @ 12:00:00

## 2023-04-05 MED ADMIN — gabapentin (NEURONTIN) capsule 400 mg: 400 mg | ORAL

## 2023-04-05 MED ADMIN — magnesium oxide-Mg AA chelate (Magnesium Plus Protein) 1 tablet: 1 | ORAL | @ 12:00:00

## 2023-04-05 MED ADMIN — carvedilol (COREG) tablet 3.125 mg: 3.125 mg | ORAL

## 2023-04-05 MED ADMIN — aspirin chewable tablet 81 mg: 81 mg | ORAL | @ 12:00:00

## 2023-04-05 MED ADMIN — insulin lispro (HumaLOG) injection 12 Units: 12 [IU] | SUBCUTANEOUS | @ 16:00:00

## 2023-04-05 MED ADMIN — polyethylene glycol (MIRALAX) packet 17 g: 17 g | ORAL | @ 04:00:00

## 2023-04-05 MED ADMIN — docusate sodium (COLACE) capsule 100 mg: 100 mg | ORAL | @ 04:00:00

## 2023-04-05 MED ADMIN — insulin lispro (HumaLOG) injection 0-6 Units: 0-6 [IU] | SUBCUTANEOUS

## 2023-04-05 MED ADMIN — magnesium sulfate 2gm/50mL IVPB: 2 g | INTRAVENOUS | @ 15:00:00 | Stop: 2023-04-05

## 2023-04-05 MED ADMIN — tacrolimus (PROGRAF) capsule 4 mg: 4 mg | ORAL | @ 22:00:00

## 2023-04-05 MED ADMIN — insulin lispro (HumaLOG) injection 0-6 Units: 0-6 [IU] | SUBCUTANEOUS | @ 07:00:00 | Stop: 2023-04-05

## 2023-04-05 MED ADMIN — carvedilol (COREG) tablet 3.125 mg: 3.125 mg | ORAL | @ 12:00:00

## 2023-04-05 MED ADMIN — piperacillin-tazobactam (ZOSYN) 3.375 g in sodium chloride 0.9 % (NS) 100 mL IVPB-MBP: 3.375 g | INTRAVENOUS | @ 09:00:00 | Stop: 2023-04-09

## 2023-04-05 MED ADMIN — magnesium oxide-Mg AA chelate (Magnesium Plus Protein) 1 tablet: 1 | ORAL

## 2023-04-05 MED ADMIN — piperacillin-tazobactam (ZOSYN) 3.375 g in sodium chloride 0.9 % (NS) 100 mL IVPB-MBP: 3.375 g | INTRAVENOUS | @ 17:00:00 | Stop: 2023-04-09

## 2023-04-05 MED ADMIN — acyclovir (ZOVIRAX) capsule 400 mg: 400 mg | ORAL

## 2023-04-05 MED ADMIN — methocarbamol (ROBAXIN) tablet 500 mg: 500 mg | ORAL | @ 16:00:00

## 2023-04-05 MED ADMIN — insulin lispro (HumaLOG) injection 6 Units: 6 [IU] | SUBCUTANEOUS | @ 04:00:00 | Stop: 2023-04-05

## 2023-04-05 MED ADMIN — acyclovir (ZOVIRAX) capsule 400 mg: 400 mg | ORAL | @ 12:00:00

## 2023-04-05 MED ADMIN — insulin lispro (HumaLOG) injection 12 Units: 12 [IU] | SUBCUTANEOUS | @ 19:00:00

## 2023-04-05 MED ADMIN — insulin lispro (HumaLOG) injection 0-9 Units: 0-9 [IU] | SUBCUTANEOUS | @ 19:00:00

## 2023-04-05 MED ADMIN — insulin NPH (HumuLIN,NovoLIN) injection 16 Units: 16 [IU] | SUBCUTANEOUS | @ 12:00:00

## 2023-04-05 MED ADMIN — methocarbamol (ROBAXIN) tablet 500 mg: 500 mg | ORAL | @ 22:00:00

## 2023-04-05 MED ADMIN — tacrolimus (PROGRAF) capsule 4 mg: 4 mg | ORAL | @ 12:00:00 | Stop: 2023-04-05

## 2023-04-05 MED ADMIN — piperacillin-tazobactam (ZOSYN) 3.375 g in sodium chloride 0.9 % (NS) 100 mL IVPB-MBP: 3.375 g | INTRAVENOUS | @ 03:00:00 | Stop: 2023-04-09

## 2023-04-05 MED ADMIN — insulin lispro (HumaLOG) injection 0-9 Units: 0-9 [IU] | SUBCUTANEOUS | @ 12:00:00

## 2023-04-05 MED ADMIN — methylPREDNISolone sodium succinate (PF) (SOLU-Medrol) injection 100 mg: 100 mg | INTRAVENOUS | @ 15:00:00 | Stop: 2023-04-05

## 2023-04-05 NOTE — Unmapped (Signed)
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 Vessels patent by doppler. Pending transjugular liver biopsy results.     Interval Events:     No acute events overnight. Afebrile, blood pressure improved (SBP 140s-150s).  Tbili / LFTs elevated but downtrending. MRCP showed dilated CBD, concerning for CBD stricture. Scheduled for ERCP tomorrow (8/13). Made NPO at midnight. Repleted mag.      Plan:     Neuro:   - Pain well controlled   - Gaba, robaxin    CV:   - HDS, Maintain SBP < 180   - continue amlodipine 5, coreg 3.125 BID     Pulm:   - Stable on room air  - OOB    GI:   - F: ML, regular diet, NPO at MN    - E: Replete as needed   - N: Regular diet, NPO at midnight w/ sips for meds   - pepcid, zofran, colace, miralax    GU:   - voiding spontaneously  - 8/6 liver US, interval increase in RIs, narrowing of PV anastomosis   - 8/9 VIR liver biopsy, pathology preliminary showing mild ACR     - Steroid taper started 8/10    - elevation in bilirubin, LFTs    - Liver US with patent vasculature     - 8/11 STAT MRCP --> CBD dilation, c/f stricture vs clot     - 8/13 ERCP scheduled, made NPO MN - PENDING    Endo:   - endocrine managing with on high dose steroids    - NPH, Lispro and correctional        Heme/ID:   - Afebrile, WBC and Hgb stable   - ppx with acyclovir, letermovir   - 8/7 CMV, negative   - 8/7 HBsAG, negative   - Start Zosyn for possible cholangitis     Immuno:   - tac, cellcept, prednisone  - pharmacy recommendations for dosing      Dispo   - Floor      Objective:        Vital Signs:  BP 150/77  - Pulse 82  - Temp 36.6 ??C (97.9 ??F) (Oral)  - Resp 17  - Ht 140.3 cm (4' 7.24)  - Wt 63.6 kg (140 lb 4.8 oz)  - SpO2 100%  - BMI 32.33 kg/m??     Input/Output:  I/O last 3 completed shifts:  In: 3503 [P.O.:2030; I.V.:1200; IV Piggyback:273]  Out: 1700 [Urine:1700]    Physical Exam:    General: Cooperative, no distress, well appearing female  Pulmonary: Normal work of breathing, on room air  Cardiovascular: Regular rate,  Abdomen: Soft, non-distended. Incisions well healed  Musculoskeletal: Moves extremities spontaneously   Neurologic: Alert and interactive, grossly intact    Labs:  Lab Results   Component Value Date    WBC 1.6 (L) 04/04/2023    HGB 10.9 (L) 04/04/2023    HCT 33.5 (L) 04/04/2023    PLT 86 (L) 04/04/2023       Lab Results   Component Value Date    NA 136 04/04/2023    K 4.1 04/04/2023    CL 105 04/04/2023    CO2 22.0 04/04/2023    BUN 16 04/04/2023    CREATININE 0.52 (L) 04/04/2023    CALCIUM 9.5 04/04/2023  MG 1.9 04/04/2023    PHOS 3.2 04/04/2023       Microbiology Results (last day)       ** No results found for the last 24 hours. **            Imaging:  All pertinent imaging personally reviewed.    MRI Abdomen W Wo Contrast MRCP    Result Date: 04/04/2023  EXAM: MRI abdomen with and without contrast, MRCP ACCESSION: [id number] UN CLINICAL INDICATION: 47 years old with Post liver transplant with hyperbilirubinemia post transjugular liver biopsy, to rule out hemobilia/bile duct obstruction      COMPARISON: Ultrasound liver transplant 04/03/2023, other prior     TECHNIQUE: MRI of the abdomen was obtained with and without IV contrast.  Multisequence, multiplanar and dedicated T2 weighted images that highlight the biliary tree were obtained.     CONTRAST: 6.3 mL of Elucirem (Gadopiclenol)     FINDINGS: LINES/DEVICES: None.     LOWER CHEST: 10.9 cm cystic lesion in the right lateral breast (4:1)     ABDOMEN:     HEPATOBILIARY: Postsurgical changes of liver transplant. Gallbladder surgically absent. Loculated collection in the gallbladder fossa measuring approximately 5.0 x 2.3 x 1.8 cm (4:18, 3:22, measuring 8.0 x 1.5 x 3.7 cm on 04/03/2023 ultrasound and 5.8 x 2.5 x 3.2 cm on 03/30/2023 ultrasound). Foci of susceptibility in this area correlate with surgical clips. There is a slight size difference between the donor and recipient common bile ducts with the donor common bile duct measuring 0.9 cm and the recipient common bile duct measuring 0.6 cm. Irregularity is noted at the bile duct anastomosis with poor visualization of the duct at this location, possibly due to susceptibility from surgical material (15:15-16). The donor cystic duct is prominent but without clear connection to the adjacent fluid collection. There is irregularity of the left hepatic biliary duct adjacent to but not definitively in communication with the fluid collection. PANCREAS: Unremarkable. SPLEEN: Splenomegaly measuring up to 17.6 cm craniocaudal. ADRENAL GLANDS: Unremarkable. KIDNEYS/URETERS: Unremarkable. BOWEL/PERITONEUM/RETROPERITONEUM: No bowel obstruction. No acute inflammatory process small volume perisplenic fluid. VASCULATURE: Abdominal aorta within normal limits. Unremarkable inferior vena cava. Prominent splenic vein with perisplenic varices. LYMPH NODES: No adenopathy.     BONES/SOFT TISSUES: Mild multilevel degenerative changes of the spine.         --Orthotopic liver transplant with loculated collection in the gallbladder fossa which may reflect biloma. --Focal irregularity at the common bile duct anastomosis with poor visualization of the duct at this location. This could be due to susceptibility from surgical material and/or motion. The donor bile duct upstream of the anastomosis measures 0.9 cm with the recipient common bile duct measuring 0.6 cm downstream. --Motion artifact limits assessment for possible bile leak location. No definite leak identified. If drainage output indicates bile leak, donor cystic duct and left hepatic bile duct could be interrogated using ERCP. If ERCP cannot be performed, Eovist enhanced bile leak protocol MRCP could be performed though it would likely also suffer from motion artifact. --Splenomegaly with perisplenic varices.     ==================== MODIFIED REPORT: (04/04/2023 3:52 PM) This report has been modified from its preliminary version; you may check the prior versions of radiology report, results history link for prior report versions (if they were previously visible in Epic). This was discussed with Dr. Cleophas Dunker by Dr. Remus Blake at 3:52 p.m. on 04/04/2023.     -----------------------------------------------

## 2023-04-05 NOTE — Unmapped (Signed)
Patient placed NPO during the morning for MRI for MRCP.  VSS, with BP 163/74 in the morning. Carvedilol was added for HTN, in addition to the already scheduled amlodipine.  Patient developed bruise under right eye; MD attributed it to hight-dose steroids. Blood sugar elevated, with provider notified and NPH was increased  Zosyn q6 added to scheduled meds.   Surgical incision and peri-wound was clean, dry and intact.    Patient scheduled to be NPO starting midnight for ERCP procedure tomorrow.  Patient ambulates with standby assist. No incidence of falls. Plan of care reviewed, and patient would like follow up with MD regarding tomorrow's procedures.    Problem: Adult Inpatient Plan of Care  Goal: Plan of Care Review  Outcome: Progressing  Flowsheets (Taken 04/04/2023 1957)  Progress: no change  Plan of Care Reviewed With:   patient   spouse  Goal: Patient-Specific Goal (Individualized)  Outcome: Progressing  Goal: Absence of Hospital-Acquired Illness or Injury  Outcome: Progressing  Intervention: Identify and Manage Fall Risk  Recent Flowsheet Documentation  Taken 04/04/2023 1800 by Phil Dopp, RN  Safety Interventions: fall reduction program maintained  Taken 04/04/2023 1600 by Phil Dopp, RN  Safety Interventions: fall reduction program maintained  Taken 04/04/2023 1400 by Phil Dopp, RN  Safety Interventions: fall reduction program maintained  Taken 04/04/2023 1000 by Phil Dopp, RN  Safety Interventions: fall reduction program maintained  Taken 04/04/2023 0845 by Phil Dopp, RN  Safety Interventions: fall reduction program maintained  Intervention: Prevent Skin Injury  Recent Flowsheet Documentation  Taken 04/04/2023 1800 by Phil Dopp, RN  Positioning for Skin: Supine/Back  Taken 04/04/2023 1600 by Phil Dopp, RN  Positioning for Skin: Supine/Back  Taken 04/04/2023 1400 by Phil Dopp, RN  Positioning for Skin:   Right   Supine/Back  Taken 04/04/2023 1200 by Phil Dopp, RN  Positioning for Skin: Left  Taken 04/04/2023 1000 by Phil Dopp, RN  Positioning for Skin: Supine/Back  Taken 04/04/2023 0845 by Phil Dopp, RN  Positioning for Skin: Supine/Back  Device Skin Pressure Protection: absorbent pad utilized/changed  Skin Protection: incontinence pads utilized  Intervention: Prevent and Manage VTE (Venous Thromboembolism) Risk  Recent Flowsheet Documentation  Taken 04/04/2023 1800 by Phil Dopp, RN  Anti-Embolism Device Type: SCD, Knee  Anti-Embolism Intervention: Refused  Anti-Embolism Device Location: BLE  Taken 04/04/2023 1600 by Phil Dopp, RN  Anti-Embolism Device Type: SCD, Knee  Anti-Embolism Intervention: Refused  Anti-Embolism Device Location: BLE  Taken 04/04/2023 1400 by Phil Dopp, RN  Anti-Embolism Device Type: SCD, Knee  Anti-Embolism Intervention: Refused  Anti-Embolism Device Location: BLE  Taken 04/04/2023 1200 by Phil Dopp, RN  Anti-Embolism Device Type: SCD, Knee  Anti-Embolism Intervention: Refused  Anti-Embolism Device Location: BLE  Taken 04/04/2023 1000 by Phil Dopp, RN  Anti-Embolism Device Type: SCD, Knee  Anti-Embolism Intervention: Refused  Anti-Embolism Device Location: BLE  Taken 04/04/2023 0845 by Phil Dopp, RN  Anti-Embolism Device Type: SCD, Knee  Anti-Embolism Intervention: Refused  Anti-Embolism Device Location: BLE  Goal: Optimal Comfort and Wellbeing  Outcome: Progressing  Goal: Readiness for Transition of Care  Outcome: Progressing  Goal: Rounds/Family Conference  Outcome: Progressing     Problem: Pain Acute  Goal: Optimal Pain Control and Function  Outcome: Progressing     Problem: Fall Injury Risk  Goal: Absence of Fall and Fall-Related Injury  Intervention: Promote Injury-Free Environment  Recent Flowsheet Documentation  Taken 04/04/2023 1800  by Phil Dopp, RN  Safety Interventions: fall reduction program maintained  Taken 04/04/2023 1600 by Phil Dopp, RN  Safety Interventions: fall reduction program maintained  Taken 04/04/2023 1400 by Phil Dopp, RN  Safety Interventions: fall reduction program maintained  Taken 04/04/2023 1000 by Phil Dopp, RN  Safety Interventions: fall reduction program maintained  Taken 04/04/2023 0845 by Phil Dopp, RN  Safety Interventions: fall reduction program maintained     Problem: Wound  Goal: Skin Health and Integrity  Intervention: Optimize Skin Protection  Recent Flowsheet Documentation  Taken 04/04/2023 1000 by Phil Dopp, RN  Pressure Reduction Techniques: frequent weight shift encouraged  Pressure Reduction Devices: pressure-redistributing mattress utilized  Taken 04/04/2023 0845 by Phil Dopp, RN  Pressure Reduction Techniques: frequent weight shift encouraged  Pressure Reduction Devices: pressure-redistributing mattress utilized  Skin Protection: incontinence pads utilized

## 2023-04-05 NOTE — Unmapped (Signed)
Biliary & Advanced Endoscopy Consult Service   Initial Consultation         Assessment and Recommendations:   Alicia Armstrong is a 47 y.o. 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 The patient is seen in consultation at the request of Gemma Payor, MD (Surg Transplant [facility])) for possible choledocholithiasis.    #Elevated LFTs in cholestatic pattern  #c/f choledocolithiasis vs post-op CBD stricture vs blood clot post liver biopsy  Unclear etiology, MRCP with dilated CBD, patient with OLT ~2 mo ago, could be stricture. Can also consider blood clot after TJLB impeding bilious flow. Will proceed with ERCP 8/13 for evaluation    - NPO@MN  8/12->8/13  - plan for ERCP 8/13    Recommendations discussed with the patient's primary team. We will continue to follow along with you.    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 Liver US 8/7 with patent hepatic vasculature, complex appearing collection in gallbladder fossa. Underwent TJLB with VIR 8/9 (8/9 0400 labs: tbili 1.5, AST 155, ALT 280, ALP 561). 8/10 with large rise in tbili and ALP. Repeat Liver US with slight increase in common hepatic artery resistive index. MRCP 8/11 with loculated collection in gallbladder fossa which may reflect bilom, irregularity at the CBD anastomosis with donor bile duct upstream of the anastomosis 9mm and recipient CBD 6mm. No definite bile leak identified.    GI consulted for possible ERCP.    Phone spanish interpreter used for majority of interview. On exam, patient sitting up in bed NAD. States that she had not had any major pain and was feeling well prior to her being admitted to hospital for her elevated LFTs. She does endorse some mild RUQ pain to palpation now. Is very hungry and hopeful that she can get procedure ASAP so that she may eat. Denies any other symptoms currently.    Objective:   Temp:  [36.6 ??C (97.9 ??F)-36.9 ??C (98.4 ??F)] 36.7 ??C (98.1 ??F)  Heart Rate:  [63-82] 63  Resp:  [14-18] 17  BP: (146-157)/(77-96) 148/84  SpO2:  [98 %-100 %] 98 %    Gen: jaundiced female sitting/lying in bed, NAD, answered questions appropriately  Abdomen: healing surgical scars, TTP to RUQ  Extremities: No edema in the BLEs    Pertinent Labs/Studies:  Labs 04/05/23  Tbili 8.1 down from 11  AST 40 down from 78  ALT 149 down from 206  ALP 920 down from 1006    MRCP 04/04/23  Impression   --Orthotopic liver transplant with loculated collection in the gallbladder fossa which may reflect biloma.   --Focal irregularity at the common bile duct anastomosis with poor visualization of the duct at this location. This could be due to susceptibility from surgical material and/or motion. The donor bile duct upstream of the anastomosis measures 0.9 cm with the recipient common bile duct measuring 0.6 cm downstream.   --Motion artifact limits assessment for possible bile leak location. No definite leak identified. If drainage output indicates bile leak, donor cystic duct and left hepatic bile duct could be interrogated using ERCP. If ERCP cannot be performed, Eovist enhanced bile leak protocol MRCP could be performed though it would likely also suffer from motion artifact.   --Splenomegaly with perisplenic varices.

## 2023-04-05 NOTE — Unmapped (Signed)
Endocrine Team Diabetes Follow Up Consult Note     Consult information:  Requesting Attending Physician : Gemma Payor, MD  Service Requesting Consult : Surg Transplant [phone])  Primary Care Provider: Doris Cheadle, MD  Impression:  Alicia Armstrong is a 47 y.o. 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 We have been consulted at the request of Gemma Payor, MD to evaluate Alicia Armstrong for hyperglycemia.     Medical Decision Making:  Diagnoses:  1. History of steroid induced-diabetes with plans for initiation of high-dose steroid  With severe hyperglycemia last 24 hours.  2. Nutrition: Complicating glycemic control. Increasing risk for both hypoglycemia and hyperglycemia.  3. Transplant. Complicating glycemic control and increasing risk for hyperglycemia.  4. Steroids. Complicating glycemic control and increasing risk for hyperglycemia.      Studies reviewed 04/05/23:  Labs: CBC, BMP, POCT-BG, and HbA1C  Interpretation: WBCs normal. Anemia noted. Normal sodium. Normal potassium. Hyperglycemia, some severe. A1C in non-diabetes range No kidney dysfunction noted. elevated LFTs with concern for rejection .  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 Now with severe steroid induced hyperglycemia. She received a total of 55 units of insulin yesterday without improvement in BG. Steroids reducing by 90% today. Will increase TDD by 20% using slightly more for meal coverage. We will continue to monitor closely, as insulin resistance should begin to improve with steroid taper.     Recommendations:  - NPH 16 units q12 hours   - Lispro 12 units TID AC  - Lispro correction factor 20 5x daily  (ACHS + 3am)  - POCT-BG 5 times a day.  - Hypoglycemia protocol.  - Ensure patient is on glucose precautions if patient taking nutrition by mouth.     Discharge planning:  In process. Will complete closer to discharge.Anticipate being able to discharge off antihyperglycemic agents.     Thank you for this consult. Discussed plan with primary team. We will continue to follow and make recommendations and place orders as appropriate.    Please page with questions or concerns: [place], [place]: [phone]  Endocrinology Diabetes Care Team on call from 6AM - 3PM on weekdays then endocrine fellow on call: [phone] from 3PM - 6AM on weekdays and on weekends and holidays.   If APP cannot be reached, please page the endocrine fellow on call.      Subjective:  Interval History:  NAEO. Patient reports feeling OK this AM. C/o abdominal pain with movement. Tolerating a regular diet. Denies N/V. Remains on high dose steroids. Plan for ERCP tomorrow, 8/13.    Initial HPI:  Alicia Armstrong is a 47 y.o. 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 We have been consulted at the request of Gemma Payor, MD to evaluate Alicia Armstrong for presumptive hyperglycemia in the setting of high dose steroids. After surgery, she required insulin during high dose steroids - though she has no history of diabetes prior to transplant. Glucose during current hospital stay have been in the mid 100s requiring minimal sliding scale insulin.     Diabetes History:  Patient has a history of  steroid induced hyperglycemia  diagnosed during last hospitalization.  Diabetes is managed by: n/a.  Current home diabetes regimen: none.  Current  home blood glucose monitoring: 2x a day, blood sugars running in the low 100s.  Hypoglycemia awareness: n/a.  Complications related to diabetes: none known      Current Nutrition:  Active Orders   Diet    NPO Sips with meds; Procedure/Test       ROS: As per HPI.     acyclovir  400 mg Oral BID    amlodipine  5 mg Oral Daily    aspirin  81 mg Oral Daily    carvedilol  3.125 mg Oral BID    gabapentin  400 mg Oral TID    [Provider Hold] heparin (porcine) for subcutaneous use  5,000 Units Subcutaneous Q8H SCH    insulin lispro  0-9 Units Subcutaneous 5XD insulin    insulin lispro  12 Units Subcutaneous TID AC    insulin NPH  16 Units Subcutaneous Q12H [facility]    letermovir  480 mg Oral Daily    magnesium oxide-Mg AA chelate  1 tablet Oral BID    methocarbamol  500 mg Oral QID    methylPREDNISolone sodium succinate  100 mg Intravenous Q12H    Followed by    Melene Muller ON 04/06/2023] methylPREDNISolone sodium succinate  80 mg Intravenous Q12H    Followed by    Melene Muller ON 04/07/2023] methylPREDNISolone sodium succinate  60 mg Intravenous Q12H    Followed by    Melene Muller ON 04/08/2023] predniSONE  40 mg Oral BID    Followed by    Melene Muller ON 04/09/2023] predniSONE  20 mg Oral BID    Followed by    Melene Muller ON 04/10/2023] predniSONE  20 mg Oral Daily    mycophenolate  250 mg Oral BID    pantoprazole  40 mg Oral Daily    piperacillin-tazobactam (ZOSYN) IV (intermittent)  3.375 g Intravenous Q6H SCH    tacrolimus  4 mg Oral Daily    And    tacrolimus  3 mg Oral Daily       Current Outpatient Medications   Medication Instructions    acetaminophen (TYLENOL) 325-650 mg, Oral, Every 8 hours PRN    acyclovir (ZOVIRAX) 400 mg, Oral, 2 times a day (standard)    aspirin (ECOTRIN) 81 mg, Oral, Daily (standard)    calcium carbonate (TUMS) 200 mg calcium (500 mg) chewable tablet 1 tablet, Oral, 3 times a day (standard)    docusate sodium (COLACE) 100 mg, Oral, 2 times a day PRN    enoxaparin (LOVENOX) 40 mg, Subcutaneous, Every 12 hours    gabapentin (NEURONTIN) 400 mg, Oral, 3 times a day (standard)    lancets Misc Use to check blood sugar as directed with insulin 3 times a day & for symptoms of high or low blood sugar.    letermovir (PREVYMIS) 480 mg, Oral, Daily (standard)    lidocaine 4 % patch Place 1 patch on skin around the incision for 12 hours nightly, then remove for 12 hours. Do not place on open skin or the incision directly.    magnesium oxide-Mg AA chelate (MAGNESIUM, AMINO ACID CHELATE,) 133 mg 1 tablet, Oral, 2 times a day (standard)    methocarbamol (ROBAXIN) 500 mg, Oral, 4 times a day    mycophenolate (CELLCEPT) 250 mg, Oral, 2 times a day (standard)    pantoprazole (PROTONIX) 40 mg, Oral, Daily (standard)    pen needle, diabetic 32 gauge x 5/32 (4 mm) Ndle Use with insulin up to 4 times/day as needed.    pentamidine in sterile water 300 mg, Inhalation,  Every 28 days    polyethylene glycol (MIRALAX) 17 gram packet Mix 1 packet in  4 to 8 ounces of liquid and drink daily as needed.    predniSONE (DELTASONE) 5 mg, Oral, Daily (standard)    SITagliptin phosphate (JANUVIA) 100 mg, Oral, Daily (standard)    tacrolimus (PROGRAF) 1 MG capsule Take 4 capsules (4 mg total) by mouth daily AND 3 capsules (3 mg total) nightly.           Past Medical History:   Diagnosis Date    Cirrhosis (CMS-HCC)        Past Surgical History:   Procedure Laterality Date    CESAREAN SECTION      PR COLSC FLX W/RMVL OF TUMOR POLYP LESION SNARE TQ N/A 09/11/2022    Procedure: COLONOSCOPY FLEX; W/REMOV TUMOR/LES BY SNARE;  Surgeon: Mickie Hillier, MD;  Location: [facility];  Service: Gastroenterology    PR REMV VEIN CLOT CAVA-ILIAC,LEG INCIS N/A 02/14/2023    Procedure: THROMBEC DIRECT OR W/CATH; VENA CAVA BY LEG INCS;  Surgeon: Florene Glen, MD;  Location: MAIN OR [place];  Service: Transplant    PR TRANSPLANT LIVER,ALLOTRANSPLANT Bilateral 02/14/2023    Procedure: LIVER ALLOTRANSPLANTATION; ORTHOTOPIC, PARTIAL OR WHOLE, FROM CADAVER OR LIVING DONOR, ANY AGE;  Surgeon: Florene Glen, MD;  Location: MAIN OR [place];  Service: Transplant    PR TRANSPLANT,PREP DONOR LIVER, WHOLE N/A 02/14/2023    Procedure: [facility] STD PREP CAD DONOR WHOLE LIVER GFT PRIOR TNSPLNT,INC CHOLE,DISS/REM SURR TISSU WO TRISEG/LOBE SPLT;  Surgeon: Florene Glen, MD;  Location: MAIN OR [place];  Service: Transplant    PR UPPER GI ENDOSCOPY,DIAGNOSIS N/A 04/02/2017    Procedure: UGI ENDO, INCLUDE ESOPHAGUS, STOMACH, & DUODENUM &/OR JEJUNUM; DX W/WO COLLECTION SPECIMN, BY BRUSH OR WASH;  Surgeon: Janyth Pupa, MD;  Location: [facility];  Service: Gastroenterology    PR UPPER GI ENDOSCOPY,DIAGNOSIS N/A 09/11/2022    Procedure: UGI ENDO, INCLUDE ESOPHAGUS, STOMACH, & DUODENUM &/OR JEJUNUM; DX W/WO COLLECTION SPECIMN, BY BRUSH OR WASH;  Surgeon: Mickie Hillier, MD;  Location: [facility];  Service: Gastroenterology       Family History   Problem Relation Age of Onset    No Known Problems Mother     Diabetes Father     No Known Problems Sister     No Known Problems Daughter     No Known Problems Maternal Grandmother     No Known Problems Maternal Grandfather     No Known Problems Paternal Grandmother     No Known Problems Paternal Grandfather     BRCA 1/2 Neg Hx     Breast cancer Neg Hx     Cancer Neg Hx     Colon cancer Neg Hx     Endometrial cancer Neg Hx     Ovarian cancer Neg Hx        Social History     Tobacco Use    Smoking status: Never    Smokeless tobacco: Never   Vaping Use    Vaping status: Never Used   Substance Use Topics    Alcohol use: No     Alcohol/week: 0.0 standard drinks of alcohol    Drug use: No       OBJECTIVE:  BP 150/77  - Pulse 82  - Temp 36.6 ??C (97.9 ??F) (Oral)  - Resp 17  - Ht 140.3 cm (4' 7.24)  - Wt 63.6 kg (140 lb 4.8 oz)  -  SpO2 100%  - BMI 32.33 kg/m??   Wt Readings from Last 12 Encounters:   03/31/23 63.6 kg (140 lb 4.8 oz)   04/04/23 63.6 kg (140 lb 3.4 oz)   03/22/23 63.6 kg (140 lb 4.8 oz)   03/22/23 63.6 kg (140 lb 4.8 oz)   03/10/23 74.2 kg (163 lb 9.3 oz)   03/08/23 72.6 kg (160 lb)   03/01/23 77.2 kg (170 lb 1.6 oz)   02/21/23 75.6 kg (166 lb 10.7 oz)   02/06/23 77.9 kg (171 lb 11.2 oz)   02/02/23 86.3 kg (190 lb 3.2 oz)   01/15/23 76.2 kg (168 lb)   12/29/22 73.4 kg (161 lb 13.1 oz)     Physical Exam  Vitals and nursing note reviewed.   Constitutional: General: She is not in acute distress.     Appearance: Normal appearance. She is ill-appearing (chronically).   Eyes:      General: Scleral icterus present.   Pulmonary:      Effort: Pulmonary effort is normal. No respiratory distress.   Skin:     General: Skin is warm and dry.   Neurological:      General: No focal deficit present.      Mental Status: She is alert and oriented to person, place, and time.   Psychiatric:         Mood and Affect: Mood normal.         BG/insulin reviewed per EMR.   Glucose, POC (mg/dL)   Date Value   96/11/5407 242 (H)   04/04/2023 374 (H)   04/04/2023 350 (H)   04/04/2023 261 (H)   04/04/2023 326 (H)   04/04/2023 314 (H)   04/04/2023 251 (H)   04/03/2023 152        Summary of labs:  Lab Results   Component Value Date    A1C 4.6 (L) 02/14/2023    A1C 4.7 (L) 12/29/2022    A1C 5.0 11/11/2016     Lab Results   Component Value Date    CREATININE 0.52 (L) 04/04/2023     Lab Results   Component Value Date    WBC 1.6 (L) 04/04/2023    HGB 10.9 (L) 04/04/2023    HCT 33.5 (L) 04/04/2023    PLT 86 (L) 04/04/2023       Lab Results   Component Value Date    NA 136 04/04/2023    K 4.1 04/04/2023    CL 105 04/04/2023    CO2 22.0 04/04/2023    BUN 16 04/04/2023    CREATININE 0.52 (L) 04/04/2023    GLU 230 (H) 04/04/2023    CALCIUM 9.5 04/04/2023    MG 1.9 04/04/2023    PHOS 3.2 04/04/2023       Lab Results   Component Value Date    BILITOT 11.0 (H) 04/04/2023    BILIDIR 8.60 (H) 04/04/2023    PROT 7.4 04/04/2023    ALBUMIN 3.8 04/04/2023    ALT 206 (H) 04/04/2023    AST 78 (H) 04/04/2023    ALKPHOS 1,006 (H) 04/04/2023    GGT 394 (H) 03/31/2023

## 2023-04-05 NOTE — Unmapped (Signed)
Tacrolimus Therapeutic Monitoring Pharmacy Note    Alicia Armstrong is a 47 y.o. female continuing tacrolimus.     Indication: Liver transplant     Date of Transplant:  02/15/2023       Prior Dosing Information: Current regimen Tacrolimus 4 mg AM + 3 mg PM      Source(s) of information used to determine prior to admission dosing: Home Medication List    Goals:  Therapeutic Drug Levels  Tacrolimus trough goal: 8-10 ng/mL    Additional Clinical Monitoring/Outcomes  Monitor renal function (SCr and urine output) and liver function (LFTs)  Monitor for signs/symptoms of adverse events (e.g., hyperglycemia, hyperkalemia, hypomagnesemia, hypertension, headache, tremor)    Results:   Tacrolimus level:  6.6 ng/mL, drawn appropriately    Pharmacokinetic Considerations and Significant Drug Interactions:  Concurrent hepatotoxic medications: None identified  Concurrent CYP3A4 substrates/inhibitors: None identified  Concurrent nephrotoxic medications: None identified    Assessment/Plan:  Recommendedation(s)  Increase to tac 4 mg BID    Follow-up  Daily tacrolimus levels .   A pharmacist will continue to monitor and recommend levels as appropriate    Please page service pharmacist with questions/clarifications.    Alicia Armstrong PharmD

## 2023-04-05 NOTE — Unmapped (Signed)
Plan of care reviewed with patient. VSS. PRN tylenol given for chronic back pain. Pt's blood sugars remained elevated overnight. It was 350 around 8:30 PM, and her scheduled insulin was given. This RN decided to reassess her BG at 11:30 PM and it was 375. MD made aware and an additional 6 units of insulin were given. Pt up ad lib. Skin intact. Voiding adequately. NPO. Protective precautions maintained. No falls or injuries this shift. Will continue to monitor.    Problem: Adult Inpatient Plan of Care  Goal: Plan of Care Review  Outcome: Progressing  Goal: Patient-Specific Goal (Individualized)  Outcome: Progressing  Goal: Absence of Hospital-Acquired Illness or Injury  Outcome: Progressing  Intervention: Prevent Skin Injury  Recent Flowsheet Documentation  Taken 04/04/2023 2000 by Lucienne Minks, RN  Positioning for Skin: Supine/Back  Intervention: Prevent and Manage VTE (Venous Thromboembolism) Risk  Recent Flowsheet Documentation  Taken 04/05/2023 0400 by Lucienne Minks, RN  Anti-Embolism Device Type: SCD, Knee  Anti-Embolism Intervention: Refused  Anti-Embolism Device Location: BLE  Taken 04/05/2023 0200 by Lucienne Minks, RN  Anti-Embolism Device Type: SCD, Knee  Anti-Embolism Intervention: Refused  Anti-Embolism Device Location: BLE  Taken 04/05/2023 0000 by Lucienne Minks, RN  Anti-Embolism Device Type: SCD, Knee  Anti-Embolism Intervention: Refused  Anti-Embolism Device Location: BLE  Taken 04/04/2023 2200 by Lucienne Minks, RN  Anti-Embolism Device Type: SCD, Knee  Anti-Embolism Intervention: Refused  Anti-Embolism Device Location: BLE  Taken 04/04/2023 2000 by Lucienne Minks, RN  Anti-Embolism Device Type: SCD, Knee  Anti-Embolism Intervention: Refused  Anti-Embolism Device Location: BLE  Goal: Optimal Comfort and Wellbeing  Outcome: Progressing  Goal: Readiness for Transition of Care  Outcome: Progressing  Goal: Rounds/Family Conference  Outcome: Progressing     Problem: Pain Acute  Goal: Optimal Pain Control and Function  Outcome: Progressing

## 2023-04-06 DIAGNOSIS — Z944 Liver transplant status: Principal | ICD-10-CM

## 2023-04-06 LAB — HEPATIC FUNCTION PANEL
ALBUMIN: 3.7 g/dL (ref 3.4–5.0)
ALKALINE PHOSPHATASE: 911 U/L — ABNORMAL HIGH (ref 46–116)
ALT (SGPT): 132 U/L — ABNORMAL HIGH (ref 10–49)
AST (SGOT): 45 U/L — ABNORMAL HIGH (ref <=34)
BILIRUBIN DIRECT: 6.7 mg/dL — ABNORMAL HIGH (ref 0.00–0.30)
BILIRUBIN TOTAL: 8.2 mg/dL — ABNORMAL HIGH (ref 0.3–1.2)
PROTEIN TOTAL: 6.4 g/dL (ref 5.7–8.2)

## 2023-04-06 LAB — BASIC METABOLIC PANEL
ANION GAP: 7 mmol/L (ref 5–14)
BLOOD UREA NITROGEN: 16 mg/dL (ref 9–23)
BUN / CREAT RATIO: 28
CALCIUM: 8.9 mg/dL (ref 8.7–10.4)
CHLORIDE: 104 mmol/L (ref 98–107)
CO2: 26 mmol/L (ref 20.0–31.0)
CREATININE: 0.57 mg/dL
EGFR CKD-EPI (2021) FEMALE: >90 mL/min/{1.73_m2} (ref >=60)
GLUCOSE RANDOM: 240 mg/dL — ABNORMAL HIGH (ref 70–179)
POTASSIUM: 3.2 mmol/L — ABNORMAL LOW (ref 3.4–4.8)
SODIUM: 137 mmol/L (ref 135–145)

## 2023-04-06 LAB — CBC
HEMATOCRIT: 28.4 % — ABNORMAL LOW (ref 34.0–44.0)
HEMOGLOBIN: 9.6 g/dL — ABNORMAL LOW (ref 11.3–14.9)
MEAN CORPUSCULAR HEMOGLOBIN CONC: 33.7 g/dL (ref 32.0–36.0)
MEAN CORPUSCULAR HEMOGLOBIN: 29 pg (ref 25.9–32.4)
MEAN CORPUSCULAR VOLUME: 86 fL (ref 77.6–95.7)
MEAN PLATELET VOLUME: 8 fL (ref 6.8–10.7)
PLATELET COUNT: 91 10*9/L — ABNORMAL LOW (ref 150–450)
RED BLOOD CELL COUNT: 3.3 10*12/L — ABNORMAL LOW (ref 3.95–5.13)
RED CELL DISTRIBUTION WIDTH: 19 % — ABNORMAL HIGH (ref 12.2–15.2)
WBC ADJUSTED: 3.1 10*9/L — ABNORMAL LOW (ref 3.6–11.2)

## 2023-04-06 LAB — PHOSPHORUS: PHOSPHORUS: 2.2 mg/dL — ABNORMAL LOW (ref 2.4–5.1)

## 2023-04-06 LAB — TACROLIMUS LEVEL, TIMED: TACROLIMUS BLOOD: 6.2 ng/mL

## 2023-04-06 LAB — MAGNESIUM: MAGNESIUM: 1.9 mg/dL (ref 1.6–2.6)

## 2023-04-06 MED ADMIN — tacrolimus (PROGRAF) capsule 5 mg: 5 mg | ORAL | @ 22:00:00

## 2023-04-06 MED ADMIN — gabapentin (NEURONTIN) capsule 400 mg: 400 mg | ORAL

## 2023-04-06 MED ADMIN — lactated Ringers infusion: 10 mL/h | INTRAVENOUS | @ 17:00:00

## 2023-04-06 MED ADMIN — methocarbamol (ROBAXIN) tablet 500 mg: 500 mg | ORAL | @ 09:00:00

## 2023-04-06 MED ADMIN — potassium chloride 10 mEq in 100 mL IVPB: 10 meq | INTRAVENOUS | @ 12:00:00 | Stop: 2023-04-06

## 2023-04-06 MED ADMIN — insulin lispro (HumaLOG) injection 12 Units: 12 [IU] | SUBCUTANEOUS | @ 01:00:00

## 2023-04-06 MED ADMIN — insulin lispro (HumaLOG) injection 12 Units: 12 [IU] | SUBCUTANEOUS | @ 22:00:00

## 2023-04-06 MED ADMIN — insulin lispro (HumaLOG) injection 0-9 Units: 0-9 [IU] | SUBCUTANEOUS | @ 08:00:00

## 2023-04-06 MED ADMIN — insulin lispro (HumaLOG) injection 0-9 Units: 0-9 [IU] | SUBCUTANEOUS | @ 01:00:00

## 2023-04-06 MED ADMIN — piperacillin-tazobactam (ZOSYN) 3.375 g in sodium chloride 0.9 % (NS) 100 mL IVPB-MBP: 3.375 g | INTRAVENOUS | @ 16:00:00 | Stop: 2023-04-09

## 2023-04-06 MED ADMIN — gabapentin (NEURONTIN) capsule 400 mg: 400 mg | ORAL | @ 13:00:00

## 2023-04-06 MED ADMIN — methocarbamol (ROBAXIN) tablet 500 mg: 500 mg | ORAL | @ 20:00:00

## 2023-04-06 MED ADMIN — potassium chloride 10 mEq in 100 mL IVPB: 10 meq | INTRAVENOUS | @ 15:00:00 | Stop: 2023-04-06

## 2023-04-06 MED ADMIN — insulin lispro (HumaLOG) injection 0-9 Units: 0-9 [IU] | SUBCUTANEOUS | @ 11:00:00

## 2023-04-06 MED ADMIN — magnesium oxide-Mg AA chelate (Magnesium Plus Protein) 1 tablet: 1 | ORAL | @ 13:00:00

## 2023-04-06 MED ADMIN — methylPREDNISolone sodium succinate (PF) (SOLU-Medrol) injection 80 mg: 80 mg | INTRAVENOUS | @ 13:00:00 | Stop: 2023-04-06

## 2023-04-06 MED ADMIN — piperacillin-tazobactam (ZOSYN) 3.375 g in sodium chloride 0.9 % (NS) 100 mL IVPB-MBP: 3.375 g | INTRAVENOUS | Stop: 2023-04-09

## 2023-04-06 MED ADMIN — mycophenolate (CELLCEPT) capsule 250 mg: 250 mg | ORAL | @ 13:00:00

## 2023-04-06 MED ADMIN — potassium phosphate 30 mmol in sodium chloride (NS) 0.9 % 250 mL infusion: 30 mmol | INTRAVENOUS | @ 19:00:00 | Stop: 2023-04-06

## 2023-04-06 MED ADMIN — insulin lispro (HumaLOG) injection 0-9 Units: 0-9 [IU] | SUBCUTANEOUS | @ 19:00:00

## 2023-04-06 MED ADMIN — acetaminophen (TYLENOL) tablet 650 mg: 650 mg | ORAL | @ 19:00:00

## 2023-04-06 MED ADMIN — pantoprazole (Protonix) EC tablet 40 mg: 40 mg | ORAL | @ 13:00:00

## 2023-04-06 MED ADMIN — ondansetron (ZOFRAN) injection: INTRAVENOUS | @ 18:00:00 | Stop: 2023-04-06

## 2023-04-06 MED ADMIN — oxyCODONE (ROXICODONE) immediate release tablet 5 mg: 5 mg | ORAL | @ 20:00:00 | Stop: 2023-04-06

## 2023-04-06 MED ADMIN — haloperidol LACTATE (HALDOL) injection: INTRAVENOUS | @ 17:00:00 | Stop: 2023-04-06

## 2023-04-06 MED ADMIN — insulin NPH (HumuLIN,NovoLIN) injection 16 Units: 16 [IU] | SUBCUTANEOUS | @ 01:00:00

## 2023-04-06 MED ADMIN — lidocaine (PF) (XYLOCAINE-MPF) 20 mg/mL (2 %) injection: INTRAVENOUS | @ 17:00:00 | Stop: 2023-04-06

## 2023-04-06 MED ADMIN — letermovir (PREVYMIS) tablet 480 mg: 480 mg | ORAL | @ 13:00:00

## 2023-04-06 MED ADMIN — acetaminophen (TYLENOL) tablet 650 mg: 650 mg | ORAL | @ 04:00:00

## 2023-04-06 MED ADMIN — carvedilol (COREG) tablet 3.125 mg: 3.125 mg | ORAL

## 2023-04-06 MED ADMIN — fentaNYL (PF) (SUBLIMAZE) injection: INTRAVENOUS | @ 18:00:00 | Stop: 2023-04-06

## 2023-04-06 MED ADMIN — tacrolimus (PROGRAF) capsule 4 mg: 4 mg | ORAL | @ 09:00:00 | Stop: 2023-04-06

## 2023-04-06 MED ADMIN — methylPREDNISolone sodium succinate (PF) (SOLU-Medrol) injection 100 mg: 100 mg | INTRAVENOUS | Stop: 2023-04-05

## 2023-04-06 MED ADMIN — acyclovir (ZOVIRAX) capsule 400 mg: 400 mg | ORAL | @ 14:00:00

## 2023-04-06 MED ADMIN — indomethacin (INDOCIN) 50 mg suppository: RECTAL | @ 17:00:00 | Stop: 2023-04-06

## 2023-04-06 MED ADMIN — acyclovir (ZOVIRAX) capsule 400 mg: 400 mg | ORAL

## 2023-04-06 MED ADMIN — oxyCODONE (ROXICODONE) immediate release tablet 5 mg: 5 mg | ORAL | @ 22:00:00 | Stop: 2023-04-20

## 2023-04-06 MED ADMIN — piperacillin-tazobactam (ZOSYN) 3.375 g in sodium chloride 0.9 % (NS) 100 mL IVPB-MBP: 3.375 g | INTRAVENOUS | @ 03:00:00 | Stop: 2023-04-09

## 2023-04-06 MED ADMIN — gabapentin (NEURONTIN) capsule 400 mg: 400 mg | ORAL | @ 19:00:00

## 2023-04-06 MED ADMIN — potassium chloride 10 mEq in 100 mL IVPB: 10 meq | INTRAVENOUS | @ 10:00:00 | Stop: 2023-04-06

## 2023-04-06 MED ADMIN — insulin NPH (HumuLIN,NovoLIN) injection 16 Units: 16 [IU] | SUBCUTANEOUS | @ 14:00:00

## 2023-04-06 MED ADMIN — fentaNYL (PF) (SUBLIMAZE) injection: INTRAVENOUS | @ 17:00:00 | Stop: 2023-04-06

## 2023-04-06 MED ADMIN — amlodipine (NORVASC) tablet 5 mg: 5 mg | ORAL | @ 13:00:00

## 2023-04-06 MED ADMIN — magnesium oxide-Mg AA chelate (Magnesium Plus Protein) 1 tablet: 1 | ORAL

## 2023-04-06 MED ADMIN — succinylcholine chloride (ANECTINE) injection: INTRAVENOUS | @ 17:00:00 | Stop: 2023-04-06

## 2023-04-06 MED ADMIN — mycophenolate (CELLCEPT) capsule 250 mg: 250 mg | ORAL

## 2023-04-06 MED ADMIN — piperacillin-tazobactam (ZOSYN) 3.375 g in sodium chloride 0.9 % (NS) 100 mL IVPB-MBP: 3.375 g | INTRAVENOUS | @ 09:00:00 | Stop: 2023-04-09

## 2023-04-06 MED ADMIN — calcium carbonate (TUMS) chewable tablet 200 mg elem calcium: 200 mg | ORAL | @ 14:00:00

## 2023-04-06 MED ADMIN — Propofol (DIPRIVAN) injection: INTRAVENOUS | @ 17:00:00 | Stop: 2023-04-06

## 2023-04-06 MED ADMIN — methocarbamol (ROBAXIN) tablet 500 mg: 500 mg | ORAL | @ 15:00:00

## 2023-04-06 MED ADMIN — sodium chloride (NS) 0.9 % infusion: 75 mL/h | INTRAVENOUS | @ 03:00:00

## 2023-04-06 MED ADMIN — potassium chloride 10 mEq in 100 mL IVPB: 10 meq | INTRAVENOUS | @ 13:00:00 | Stop: 2023-04-06

## 2023-04-06 MED ADMIN — carvedilol (COREG) tablet 6.25 mg: 6.25 mg | ORAL | @ 13:00:00

## 2023-04-06 MED ADMIN — aspirin chewable tablet 81 mg: 81 mg | ORAL | @ 13:00:00

## 2023-04-06 NOTE — Unmapped (Signed)
-   VS Stable   - One complaint of back pain resolved with PRN Tylenol  - Maintained on protective precautions at all times  - Maintained NPO @ 0000  - Adequate UOP, voiding freely  - Ambulated inside her room  - Incision completely healed   - No questions or concerns at this point in time    Relevant to note:  - Small bruise underneath R eye, SRF aware & at bedside        Problem: Adult Inpatient Plan of Care  Goal: Plan of Care Review  Outcome: Progressing  Goal: Patient-Specific Goal (Individualized)  Outcome: Progressing  Goal: Absence of Hospital-Acquired Illness or Injury  Outcome: Progressing  Intervention: Prevent and Manage VTE (Venous Thromboembolism) Risk  Recent Flowsheet Documentation  Taken 04/06/2023 0600 by Caffie Pinto, RN  Anti-Embolism Device Type: SCD, Knee  Anti-Embolism Intervention: Refused  Anti-Embolism Device Location: BLE  Taken 04/06/2023 0400 by Caffie Pinto, RN  Anti-Embolism Device Type: SCD, Knee  Anti-Embolism Intervention: Refused  Anti-Embolism Device Location: BLE  Taken 04/06/2023 0200 by Caffie Pinto, RN  Anti-Embolism Device Type: SCD, Knee  Anti-Embolism Intervention: Refused  Anti-Embolism Device Location: BLE  Taken 04/06/2023 0000 by Caffie Pinto, RN  Anti-Embolism Device Type: SCD, Knee  Anti-Embolism Intervention: Refused  Anti-Embolism Device Location: BLE  Taken 04/05/2023 2200 by Caffie Pinto, RN  Anti-Embolism Device Type: SCD, Knee  Anti-Embolism Intervention: Refused  Anti-Embolism Device Location: BLE  Taken 04/05/2023 2000 by Caffie Pinto, RN  Anti-Embolism Device Type: SCD, Knee  Anti-Embolism Intervention: Refused  Anti-Embolism Device Location: BLE  Goal: Optimal Comfort and Wellbeing  Outcome: Progressing  Goal: Readiness for Transition of Care  Outcome: Progressing  Goal: Rounds/Family Conference  Outcome: Progressing     Problem: Wound  Goal: Skin Health and Integrity  Intervention: Optimize Skin Protection  Recent Flowsheet Documentation  Taken 04/05/2023 2000 by Caffie Pinto, RN  Head of Bed [facility]) Positioning: [facility] elevated     Problem: Pain Acute  Goal: Optimal Pain Control and Function  Outcome: Progressing

## 2023-04-06 NOTE — Unmapped (Signed)
Tacrolimus Therapeutic Monitoring Pharmacy Note    Alicia Armstrong is a 47 y.o. female continuing tacrolimus.     Indication: Liver transplant     Date of Transplant:  02/15/2023       Prior Dosing Information: Current regimen Tacrolimus 4 mg BID      Source(s) of information used to determine prior to admission dosing: Home Medication List    Goals:  Therapeutic Drug Levels  Tacrolimus trough goal: 8-10 ng/mL    Additional Clinical Monitoring/Outcomes  Monitor renal function (SCr and urine output) and liver function (LFTs)  Monitor for signs/symptoms of adverse events (e.g., hyperglycemia, hyperkalemia, hypomagnesemia, hypertension, headache, tremor)    Results:   Tacrolimus level:  6.2 ng/mL, drawn ~1.5 hours early    Pharmacokinetic Considerations and Significant Drug Interactions:  Concurrent hepatotoxic medications: None identified  Concurrent CYP3A4 substrates/inhibitors: None identified  Concurrent nephrotoxic medications: None identified    Assessment/Plan:  Recommendedation(s)  Increase to tacrolimus 5 mg BID    Follow-up  Daily tacrolimus levels .   A pharmacist will continue to monitor and recommend levels as appropriate    Please page service pharmacist with questions/clarifications.    Ardeen Garland PharmD Candidate

## 2023-04-06 NOTE — Unmapped (Signed)
Hepatology Consult Service   Treatment Plan         Recommendations:   ERCP today with findings below.    Findings:       A scout film of the abdomen was obtained. Surgical clips consistent with        previous liver transplantation were seen in the area of the right upper        quadrant of the abdomen. The major papilla was normal. The ventral        pancreatic duct was inadvertently cannulated with the short-nosed        traction sphincterotome and guidewire without any complications. The        entire opacified area was normal. One 5 Fr by 4 cm plastic pancreatic        stent with a 3/4 external pigtail and a single internal flap was placed        into the ventral pancreatic duct. The stent was in good position. The        bile duct was deeply cannulated with the short-nosed traction        sphincterotome and glidewire. Contrast was injected. I personally        interpreted the bile duct images. There was brisk flow of contrast        through the ducts. Image quality was excellent. Contrast extended to the        entire biliary tree. The post-transplant anastomosis contained a single        moderate stenosis. There was no extravasation of contrast. An 8 mm        biliary sphincterotomy was made with a monofilament sphincterotome using        ERBE electrocautery. There was no post-sphincterotomy bleeding. One 8 mm        by 10 cm covered metal biliary stent (Viabil) was placed into the common        bile duct. The stent was in good position.    Impression:            - The major papilla appeared normal.                         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 The stricture was                          post-surgical.                         - One plastic pancreatic stent was placed into the                          ventral pancreatic duct.                         - A biliary sphincterotomy was performed.                         - One covered metal biliary stent was placed into the  common bile duct (Viabil) across the anastomosis.                         - No specimens collected      Thank you for involving [place] in the care of your patient. We will sign-off at this time, please re-contact if additional questions or a new need for consultation arises.

## 2023-04-06 NOTE — Unmapped (Signed)
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 Vessels patent by doppler. Pending transjugular liver biopsy results.     Interval Events:     No acute events overnight. Continues to have elevated blood pressures with SBP's in the 180s overnight.  Bilirubin elevated but stable.  LFTs mildly elevated and stable as well.  Will plan for ERCP today.      Plan:     Neuro:   - Pain well controlled   - Gaba, robaxin    CV:   - HDS, Maintain SBP < 180   - continue amlodipine 5, Increase coreg to 6.25 BID     Pulm:   - Stable on room air  - OOB    GI:   - F: ML, NPO for ERCP    - E: Replete as needed   - N: Regular diet, NPO at midnight w/ sips for meds   - pepcid, zofran, colace, miralax    GU:   - voiding spontaneously  - 8/6 liver US, interval increase in RIs, narrowing of PV anastomosis   - 8/9 VIR liver biopsy, pathology preliminary showing mild ACR     - Steroid taper started 8/10    - elevation in bilirubin, LFTs    - Liver US with patent vasculature     - 8/11 MRCP --> CBD dilation, c/f stricture vs clot     - 8/13 ERCP scheduled, made NPO MN - PENDING    Endo:   - endocrine managing with on high dose steroids    - NPH, Lispro and correctional        Heme/ID:   - Afebrile, WBC and Hgb stable   - ppx with acyclovir, letermovir   - 8/7 CMV, negative   - 8/7 HBsAG, negative   - Zosyn for possible cholangitis     Immuno:   - tac, cellcept, prednisone  - pharmacy recommendations for dosing      Dispo   - Floor      Objective:        Vital Signs:  BP 164/92  - Pulse 67  - Temp 36.4 ??C (97.6 ??F) (Temporal)  - Resp 14  - Ht 140.3 cm (4' 7.24)  - Wt 63.6 kg (140 lb 4.8 oz)  - SpO2 98%  - BMI 32.33 kg/m??     Input/Output:  I/O last 3 completed shifts:  In: 2330 [P.O.:1130; I.V.:900; IV Piggyback:300]  Out: 3350 [Urine:3350]    Physical Exam:    General: Cooperative, no distress, well appearing female  Pulmonary: Normal work of breathing, on room air  Cardiovascular: Regular rate,  Abdomen: Soft, non-distended. Incisions well healed  Musculoskeletal: Moves extremities spontaneously   Neurologic: Alert and interactive, grossly intact    Labs:  Lab Results   Component Value Date    WBC 3.1 (L) 04/06/2023    HGB 9.6 (L) 04/06/2023    HCT 28.4 (L) 04/06/2023    PLT 91 (L) 04/06/2023       Lab Results   Component Value Date    NA 137 04/06/2023    K 3.2 (L) 04/06/2023    CL 104 04/06/2023    CO2 26.0 04/06/2023    BUN 16 04/06/2023    CREATININE 0.57 04/06/2023    CALCIUM 8.9 04/06/2023    MG  1.9 04/06/2023    PHOS 2.2 (L) 04/06/2023       Microbiology Results (last day)       ** No results found for the last 24 hours. **            Imaging:  All pertinent imaging personally reviewed.    ERCP    Result Date: 04/06/2023  _______________________________________________________________________________ Patient Name: Alicia Armstrong              Procedure Date: 04/06/2023 12:26 PM MRN: [id number]                     Date of Birth: 09-Sep-1975 Admit Type: Inpatient                 Age: 47 Room: [facility] 02 [facility]          Gender: Female Note Status: Finalized                Instrument Name: [id number] _______________________________________________________________________________  Procedure:             ERCP Indications:           Abnormal MRCP, Jaundice Providers:             Jules Husbands, MD, Sofie Rower, RN,                        Blessing Maduakolam, Technician, TODD Gevena Barre, MD Referring MD:          Gemma Payor, MD (Referring MD) Medicines:             General Anesthesia, Indomethacin 100 mg PR Complications:         No immediate complications. _______________________________________________________________________________ Procedure:             Pre-Anesthesia Assessment:                        - Prior to the procedure, a History and Physical was                        performed, and patient medications and allergies were                        reviewed. The patient's tolerance of previous                        anesthesia was also reviewed. The risks and benefits                        of the procedure and the sedation options and risks                        were discussed with the patient. All questions were                        answered, and informed consent was obtained. Prior                        Anticoagulants: The patient has taken no anticoagulant                        or antiplatelet agents. ASA Grade Assessment: III - A  patient with severe systemic disease. After reviewing                        the risks and benefits, the patient was deemed in                        satisfactory condition to undergo the procedure.                        After obtaining informed consent, the scope was passed                        under direct vision. Throughout the procedure, the                        patient's blood pressure, pulse, and oxygen                        saturations were monitored continuously. The                        Duodenoscope was introduced through the mouth, and                        advanced to the duodenum and used to inject contrast                        into the bile duct and ventral pancreatic duct. The                        ERCP was accomplished without difficulty. The patient                        tolerated the procedure well.                                                                                Findings:      A scout film of the abdomen was obtained. Surgical clips consistent with      previous liver transplantation were seen in the area of the right upper      quadrant of the abdomen. The major papilla was normal. The ventral      pancreatic duct was inadvertently cannulated with the short-nosed      traction sphincterotome and guidewire without any complications. The      entire opacified area was normal. One 5 Fr by 4 cm plastic pancreatic      stent with a 3/4 external pigtail and a single internal flap was placed      into the ventral pancreatic duct. The stent was in good position. The      bile duct was deeply cannulated with the short-nosed traction      sphincterotome and glidewire. Contrast was injected. I personally      interpreted the bile duct images. There was brisk flow of contrast      through the ducts. Image quality was  excellent. Contrast extended to the      entire biliary tree. The post-transplant anastomosis contained a single      moderate stenosis. There was no extravasation of contrast. An 8 mm      biliary sphincterotomy was made with a monofilament sphincterotome using      ERBE electrocautery. There was no post-sphincterotomy bleeding. One 8 mm      by 10 cm covered metal biliary stent (Viabil) was placed into the common      bile duct. The stent was in good position.                                                                                Estimated Blood Loss:      Estimated blood loss was minimal. Impression:            - The major papilla appeared normal.                        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 The stricture was                        post-surgical.                        - One plastic pancreatic stent was placed into the                        ventral pancreatic duct.                        - A biliary sphincterotomy was performed.                        - One covered metal biliary stent was placed into the                        common bile duct (Viabil) across the anastomosis.                        - No specimens collected Recommendation:        - Return patient to hospital ward for ongoing care.                        - Resume previous diet today.                        - Continue present medications.                        - Observe patient's clinical course.                        - Confirm spontaneous pancreatic duct stent passage by  performing a KUB x-ray in 2 weeks. If this stent                        remains in place, may need EGD for removal prior to                        removal of the biliary stent to prevent occlusion and                        pancreatitis.                        - Most likely will plan to repeat an ERCP in 6 months                        for stent removal/exchange; can be sooner only if                        clinically indicated.                                                                                Procedure Code(s):     --- Professional ---                        [phone], Endoscopic retrograde cholangiopancreatography                        (ERCP); with placement of endoscopic stent into                        biliary or pancreatic duct, including pre- and                        post-dilation and guide wire passage, when performed,                        including sphincterotomy, when performed, each stent                        43274, 59, Endoscopic retrograde                        cholangiopancreatography (ERCP); with placement of                        endoscopic stent into biliary or pancreatic duct,                        including pre- and post-dilation and guide wire                        passage, when performed, including sphincterotomy,                        when performed, each stent  64332, Endoscopic catheterization of the biliary                        ductal system, radiological supervision and                        interpretation Diagnosis Code(s):     --- Professional ---                        K91.89, Other postprocedural complications and                        disorders of digestive system                        K83.1, Obstruction of bile duct                        R17, Unspecified jaundice                        R93.2, Abnormal findings on diagnostic imaging of                        liver and biliary tract     CPT copyright 2023 [employer]. All rights reserved.     The codes documented in this report are preliminary and upon coder review may be revised to meet current compliance requirements. Attending Participation:      I was present and participated during the entire procedure, including      non-key portions.                                                                                    Electronically Signed By Arty Baumgartner, MD ___________________________ Jules Husbands, MD 04/06/2023 1:42:33 PM The attending physician was present throughout the entire procedure including the insertion, viewing, and removal of the endoscope. This procedure note has been electronically signed by: Arty Baumgartner , MD     ______________________ TODD Gevena Barre, MD Number of Addenda: 0     Note Initiated On: 04/06/2023 12:26 PM

## 2023-04-06 NOTE — Unmapped (Signed)
Endocrine Team Diabetes Follow Up Consult Note     Consult information:  Requesting Attending Physician : Gemma Payor, MD  Service Requesting Consult : Surg Transplant [phone])  Primary Care Provider: Doris Cheadle, MD  Impression:  Alicia Armstrong is a 47 y.o. 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 We have been consulted at the request of Gemma Payor, MD to evaluate Alicia Armstrong for hyperglycemia.     Medical Decision Making:  Diagnoses:  1. History of steroid induced-diabetes with plans for initiation of high-dose steroid  With severe hyperglycemia last 24 hours.  2. Nutrition: Complicating glycemic control. Increasing risk for both hypoglycemia and hyperglycemia.  3. Transplant. Complicating glycemic control and increasing risk for hyperglycemia.  4. Steroids. Complicating glycemic control and increasing risk for hyperglycemia.      Studies reviewed 04/06/23:  Labs: CBC, BMP, POCT-BG, and HbA1C  Interpretation: Leukopenia noted. Anemia noted. Normal sodium. Hypokalemia. Hyperglycemia, some severe. A1C in non-diabetes range No kidney dysfunction noted. Elevated LFTs .  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 Now with severe steroid induced hyperglycemia. All insulin increased significantly yesterday, however, she did not receive the full dose of her morning or evening NPH due to possible NPO status. FPG improved to 163 mg/dL with overnight correction. Steroids reducing by an additional 20% today and she is NPO for ERCP. Will hold off on increasing insulin for now and continue to monitor closely, as insulin resistance should begin to improve with steroid taper.     Recommendations:  - NPH 16 units q12 hours    - give 75% if NPO  - Lispro 12 units TID AC  - Lispro correction factor 20 5x daily  (ACHS + 3am)  - POCT-BG 5 times a day.  - Hypoglycemia protocol.  - Ensure patient is on glucose precautions if patient taking nutrition by mouth.     Discharge planning:  In process. Will complete closer to discharge.Anticipate being able to discharge off antihyperglycemic agents.     Thank you for this consult. Discussed plan with primary team. We will continue to follow and make recommendations and place orders as appropriate.    Please page with questions or concerns: Daivd Council, [place]: [phone]  Endocrinology Diabetes Care Team on call from 6AM - 3PM on weekdays then endocrine fellow on call: [phone] from 3PM - 6AM on weekdays and on weekends and holidays.   If APP cannot be reached, please page the endocrine fellow on call.      Subjective:  Interval History:  NAEO. Patient c/o burning central chest pain and pressure after taking her mediations this AM. Denies SOB, back pain, abdominal pain, arm pain, or N/V. HR normal but BP elevated at time of rounds. RN aware. Plan for ERCP today, 8/13.    Initial HPI:  Alicia Armstrong is a 47 y.o. 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 We have been consulted at the request of Gemma Payor, MD to evaluate Alicia Armstrong for presumptive hyperglycemia in the setting of high dose steroids. After surgery, she required insulin during high dose steroids - though she has no history of diabetes prior to transplant. Glucose during current hospital stay have been in the mid 100s requiring minimal sliding  scale insulin.     Diabetes History:  Patient has a history of  steroid induced hyperglycemia  diagnosed during last hospitalization.  Diabetes is managed by: n/a.  Current home diabetes regimen: none.  Current home blood glucose monitoring: 2x a day, blood sugars running in the low 100s.  Hypoglycemia awareness: n/a.  Complications related to diabetes: none known      Current Nutrition:  Active Orders   Diet    NPO Sips with meds; Procedure/Test       ROS: As per HPI.     acyclovir  400 mg Oral BID    amlodipine  5 mg Oral Daily    aspirin  81 mg Oral Daily    carvedilol  6.25 mg Oral BID    gabapentin  400 mg Oral TID    [Provider Hold] heparin (porcine) for subcutaneous use  5,000 Units Subcutaneous Q8H SCH    insulin lispro  0-9 Units Subcutaneous 5XD insulin    insulin lispro  12 Units Subcutaneous TID AC    insulin NPH  16 Units Subcutaneous Q12H SCH    letermovir  480 mg Oral Daily    magnesium oxide-Mg AA chelate  1 tablet Oral BID    methocarbamol  500 mg Oral QID    methylPREDNISolone sodium succinate  80 mg Intravenous Q12H    Followed by    Melene Muller ON 04/07/2023] methylPREDNISolone sodium succinate  60 mg Intravenous Q12H    Followed by    Melene Muller ON 04/08/2023] predniSONE  40 mg Oral BID    Followed by    Melene Muller ON 04/09/2023] predniSONE  20 mg Oral BID    Followed by    Melene Muller ON 04/10/2023] predniSONE  20 mg Oral Daily    mycophenolate  250 mg Oral BID    pantoprazole  40 mg Oral Daily    piperacillin-tazobactam (ZOSYN) IV (intermittent)  3.375 g Intravenous Q6H SCH    potassium chloride in water  10 mEq Intravenous Once    Followed by    potassium chloride in water  10 mEq Intravenous Once    Followed by    potassium chloride in water  10 mEq Intravenous Once    Followed by    potassium chloride in water  10 mEq Intravenous Once    potassium phosphate  30 mmol Intravenous Once    tacrolimus  4 mg Oral BID       Current Outpatient Medications   Medication Instructions    acetaminophen (TYLENOL) 325-650 mg, Oral, Every 8 hours PRN    acyclovir (ZOVIRAX) 400 mg, Oral, 2 times a day (standard)    aspirin (ECOTRIN) 81 mg, Oral, Daily (standard)    calcium carbonate (TUMS) 200 mg calcium (500 mg) chewable tablet 1 tablet, Oral, 3 times a day (standard)    docusate sodium (COLACE) 100 mg, Oral, 2 times a day PRN    enoxaparin (LOVENOX) 40 mg, Subcutaneous, Every 12 hours    gabapentin (NEURONTIN) 400 mg, Oral, 3 times a day (standard)    lancets Misc Use to check blood sugar as directed with insulin 3 times a day & for symptoms of high or low blood sugar.    letermovir (PREVYMIS) 480 mg, Oral, Daily (standard)    lidocaine 4 % patch Place 1 patch on skin around the incision for 12 hours nightly, then remove for 12 hours. Do not place on open skin or the incision directly.    magnesium oxide-Mg AA chelate (MAGNESIUM, AMINO ACID  CHELATE,) 133 mg 1 tablet, Oral, 2 times a day (standard)    methocarbamol (ROBAXIN) 500 mg, Oral, 4 times a day    mycophenolate (CELLCEPT) 250 mg, Oral, 2 times a day (standard)    pantoprazole (PROTONIX) 40 mg, Oral, Daily (standard)    pen needle, diabetic 32 gauge x 5/32 (4 mm) Ndle Use with insulin up to 4 times/day as needed.    pentamidine in sterile water 300 mg, Inhalation, Every 28 days    polyethylene glycol (MIRALAX) 17 gram packet Mix 1 packet in  4 to 8 ounces of liquid and drink daily as needed.    predniSONE (DELTASONE) 5 mg, Oral, Daily (standard)    SITagliptin phosphate (JANUVIA) 100 mg, Oral, Daily (standard)    tacrolimus (PROGRAF) 1 MG capsule Take 4 capsules (4 mg total) by mouth daily AND 3 capsules (3 mg total) nightly.           Past Medical History:   Diagnosis Date    Cirrhosis (CMS-HCC)        Past Surgical History:   Procedure Laterality Date    CESAREAN SECTION      PR COLSC FLX W/RMVL OF TUMOR POLYP LESION SNARE TQ N/A 09/11/2022    Procedure: COLONOSCOPY FLEX; W/REMOV TUMOR/LES BY SNARE;  Surgeon: Mickie Hillier, MD;  [facility]: [facility];  Service: Gastroenterology    PR REMV VEIN CLOT CAVA-ILIAC,LEG INCIS N/A 02/14/2023    Procedure: THROMBEC DIRECT OR W/CATH; VENA CAVA BY LEG INCS;  Surgeon: Florene Glen, MD;  Location: MAIN OR [place];  Service: Transplant    PR TRANSPLANT LIVER,ALLOTRANSPLANT Bilateral 02/14/2023    Procedure: LIVER ALLOTRANSPLANTATION; ORTHOTOPIC, PARTIAL OR WHOLE, FROM CADAVER OR LIVING DONOR, ANY AGE; Surgeon: Florene Glen, MD;  Location: MAIN OR [place];  Service: Transplant    PR TRANSPLANT,PREP DONOR LIVER, WHOLE N/A 02/14/2023    Procedure: [facility] STD PREP CAD DONOR WHOLE LIVER GFT PRIOR TNSPLNT,INC CHOLE,DISS/REM SURR TISSU WO TRISEG/LOBE SPLT;  Surgeon: Florene Glen, MD;  Location: MAIN OR [place];  Service: Transplant    PR UPPER GI ENDOSCOPY,DIAGNOSIS N/A 04/02/2017    Procedure: UGI ENDO, INCLUDE ESOPHAGUS, STOMACH, & DUODENUM &/OR JEJUNUM; DX W/WO COLLECTION SPECIMN, BY BRUSH OR WASH;  Surgeon: Janyth Pupa, MD;  Location: [facility];  Service: Gastroenterology    PR UPPER GI ENDOSCOPY,DIAGNOSIS N/A 09/11/2022    Procedure: UGI ENDO, INCLUDE ESOPHAGUS, STOMACH, & DUODENUM &/OR JEJUNUM; DX W/WO COLLECTION SPECIMN, BY BRUSH OR WASH;  Surgeon: Mickie Hillier, MD;  [facility]: [facility];  Service: Gastroenterology       Family History   Problem Relation Age of Onset    No Known Problems Mother     Diabetes Father     No Known Problems Sister     No Known Problems Daughter     No Known Problems Maternal Grandmother     No Known Problems Maternal Grandfather     No Known Problems Paternal Grandmother     No Known Problems Paternal Grandfather     BRCA 1/2 Neg Hx     Breast cancer Neg Hx     Cancer Neg Hx     Colon cancer Neg Hx     Endometrial cancer Neg Hx     Ovarian cancer Neg Hx        Social History     Tobacco Use    Smoking status: Never    Smokeless tobacco: Never   Vaping  Use    Vaping status: Never Used   Substance Use Topics    Alcohol use: No     Alcohol/week: 0.0 standard drinks of alcohol    Drug use: No       OBJECTIVE:  BP 180/94  - Pulse 62  - Temp 36.6 ??C (97.9 ??F) (Oral)  - Resp 18  - Ht 140.3 cm (4' 7.24)  - Wt 63.6 kg (140 lb 4.8 oz)  - SpO2 100%  - BMI 32.33 kg/m??   Wt Readings from Last 12 Encounters:   03/31/23 63.6 kg (140 lb 4.8 oz)   04/04/23 63.6 kg (140 lb 3.4 oz)   03/22/23 63.6 kg (140 lb 4.8 oz)   03/22/23 63.6 kg (140 lb 4.8 oz)   03/10/23 74.2 kg (163 lb 9.3 oz)   03/08/23 72.6 kg (160 lb)   03/01/23 77.2 kg (170 lb 1.6 oz)   02/21/23 75.6 kg (166 lb 10.7 oz)   02/06/23 77.9 kg (171 lb 11.2 oz)   02/02/23 86.3 kg (190 lb 3.2 oz)   01/15/23 76.2 kg (168 lb)   12/29/22 73.4 kg (161 lb 13.1 oz)     Physical Exam  Vitals and nursing note reviewed.   Constitutional:       General: She is not in acute distress.     Appearance: Normal appearance. She is ill-appearing (chronically).   Eyes:      General: Scleral icterus present.   Pulmonary:      Effort: Pulmonary effort is normal. No respiratory distress.   Skin:     General: Skin is warm and dry.   Neurological:      General: No focal deficit present.      Mental Status: She is alert and oriented to person, place, and time.   Psychiatric:         Mood and Affect: Mood normal.         BG/insulin reviewed per EMR.   Glucose, POC (mg/dL)   Date Value   16/05/9603 163   04/06/2023 272 (H)   04/05/2023 263 (H)   04/05/2023 255 (H)   04/05/2023 144   04/05/2023 202 (H)   04/05/2023 242 (H)   04/04/2023 374 (H)        Summary of labs:  Lab Results   Component Value Date    A1C 4.6 (L) 02/14/2023    A1C 4.7 (L) 12/29/2022    A1C 5.0 11/11/2016     Lab Results   Component Value Date    CREATININE 0.57 04/06/2023     Lab Results   Component Value Date    WBC 3.1 (L) 04/06/2023    HGB 9.6 (L) 04/06/2023    HCT 28.4 (L) 04/06/2023    PLT 91 (L) 04/06/2023       Lab Results   Component Value Date    NA 137 04/06/2023    K 3.2 (L) 04/06/2023    CL 104 04/06/2023    CO2 26.0 04/06/2023    BUN 16 04/06/2023    CREATININE 0.57 04/06/2023    GLU 240 (H) 04/06/2023    CALCIUM 8.9 04/06/2023    MG 1.9 04/06/2023    PHOS 2.2 (L) 04/06/2023       Lab Results   Component Value Date    BILITOT 8.2 (H) 04/06/2023    BILIDIR 6.70 (H) 04/06/2023    PROT 6.4 04/06/2023    ALBUMIN 3.7 04/06/2023    ALT 132 (H) 04/06/2023  AST 45 (H) 04/06/2023    ALKPHOS 911 (H) 04/06/2023    GGT 394 (H) 03/31/2023

## 2023-04-07 DIAGNOSIS — Z944 Liver transplant status: Principal | ICD-10-CM

## 2023-04-07 DIAGNOSIS — T85528A Displacement of other gastrointestinal prosthetic devices, implants and grafts, initial encounter: Principal | ICD-10-CM

## 2023-04-07 LAB — HEPATIC FUNCTION PANEL
ALBUMIN: 3.4 g/dL (ref 3.4–5.0)
ALKALINE PHOSPHATASE: 701 U/L — ABNORMAL HIGH (ref 46–116)
ALT (SGPT): 96 U/L — ABNORMAL HIGH (ref 10–49)
AST (SGOT): 21 U/L (ref <=34)
BILIRUBIN DIRECT: 1.9 mg/dL — ABNORMAL HIGH (ref 0.00–0.30)
BILIRUBIN TOTAL: 2.4 mg/dL — ABNORMAL HIGH (ref 0.3–1.2)
PROTEIN TOTAL: 6 g/dL (ref 5.7–8.2)

## 2023-04-07 LAB — CBC
HEMATOCRIT: 30.2 % — ABNORMAL LOW (ref 34.0–44.0)
HEMOGLOBIN: 9.9 g/dL — ABNORMAL LOW (ref 11.3–14.9)
MEAN CORPUSCULAR HEMOGLOBIN CONC: 32.8 g/dL (ref 32.0–36.0)
MEAN CORPUSCULAR HEMOGLOBIN: 28.6 pg (ref 25.9–32.4)
MEAN CORPUSCULAR VOLUME: 87 fL (ref 77.6–95.7)
MEAN PLATELET VOLUME: 8 fL (ref 6.8–10.7)
PLATELET COUNT: 91 10*9/L — ABNORMAL LOW (ref 150–450)
RED BLOOD CELL COUNT: 3.47 10*12/L — ABNORMAL LOW (ref 3.95–5.13)
RED CELL DISTRIBUTION WIDTH: 18.7 % — ABNORMAL HIGH (ref 12.2–15.2)
WBC ADJUSTED: 3.3 10*9/L — ABNORMAL LOW (ref 3.6–11.2)

## 2023-04-07 LAB — BASIC METABOLIC PANEL
ANION GAP: 7 mmol/L (ref 5–14)
BLOOD UREA NITROGEN: 18 mg/dL (ref 9–23)
BUN / CREAT RATIO: 28
CALCIUM: 8.6 mg/dL — ABNORMAL LOW (ref 8.7–10.4)
CHLORIDE: 103 mmol/L (ref 98–107)
CO2: 27 mmol/L (ref 20.0–31.0)
CREATININE: 0.64 mg/dL
EGFR CKD-EPI (2021) FEMALE: >90 mL/min/{1.73_m2} (ref >=60)
GLUCOSE RANDOM: 226 mg/dL — ABNORMAL HIGH (ref 70–179)
POTASSIUM: 4.2 mmol/L (ref 3.4–4.8)
SODIUM: 137 mmol/L (ref 135–145)

## 2023-04-07 LAB — PHOSPHORUS: PHOSPHORUS: 4.3 mg/dL (ref 2.4–5.1)

## 2023-04-07 LAB — TACROLIMUS LEVEL, TIMED: TACROLIMUS BLOOD: 6.5 ng/mL

## 2023-04-07 LAB — MAGNESIUM: MAGNESIUM: 1.6 mg/dL (ref 1.6–2.6)

## 2023-04-07 MED ORDER — DOCUSATE SODIUM 100 MG CAPSULE
ORAL_CAPSULE | Freq: Two times a day (BID) | ORAL | 11 refills | 30 days | Status: CP | PRN
Start: 2023-04-07 — End: ?
  Filled 2023-04-07: qty 60, 30d supply, fill #0

## 2023-04-07 MED ORDER — MG-PLUS-PROTEIN 133 MG TABLET
ORAL_TABLET | Freq: Two times a day (BID) | ORAL | 6 refills | 50 days | Status: CP
Start: 2023-04-07 — End: ?
  Filled 2023-04-07: qty 100, 50d supply, fill #0

## 2023-04-07 MED ORDER — INSULIN GLARGINE (U-100) 100 UNIT/ML (3 ML) SUBCUTANEOUS PEN
Freq: Every evening | SUBCUTANEOUS | 0 refills | 60.00000 days | Status: CP
Start: 2023-04-07 — End: 2023-06-06

## 2023-04-07 MED ORDER — TACROLIMUS 1 MG CAPSULE, IMMEDIATE-RELEASE
ORAL_CAPSULE | Freq: Two times a day (BID) | ORAL | 11 refills | 30.00000 days | Status: CP
Start: 2023-04-07 — End: 2023-04-07
  Filled 2023-04-07: qty 360, 30d supply, fill #0

## 2023-04-07 MED ORDER — PANTOPRAZOLE 40 MG TABLET,DELAYED RELEASE
ORAL_TABLET | Freq: Every day | ORAL | 11 refills | 30 days | Status: CP
Start: 2023-04-07 — End: ?
  Filled 2023-04-22: qty 30, 30d supply, fill #0

## 2023-04-07 MED ORDER — GABAPENTIN 400 MG CAPSULE
ORAL_CAPSULE | Freq: Three times a day (TID) | ORAL | 11 refills | 30 days | Status: CP
Start: 2023-04-07 — End: ?

## 2023-04-07 MED ORDER — SITAGLIPTIN PHOSPHATE 100 MG TABLET
ORAL_TABLET | Freq: Every day | ORAL | 11 refills | 30 days | Status: CP
Start: 2023-04-07 — End: ?
  Filled 2023-05-20: qty 30, 30d supply, fill #0

## 2023-04-07 MED ORDER — INSULIN LISPRO (U-100) 100 UNIT/ML SUBCUTANEOUS PEN
Freq: Three times a day (TID) | SUBCUTANEOUS | 11 refills | 36.00000 days | Status: CP
Start: 2023-04-07 — End: 2023-04-07
  Filled 2023-04-07: qty 15, 34d supply, fill #0

## 2023-04-07 MED ORDER — ACCU-CHEK GUIDE TEST STRIPS
ORAL_STRIP | Freq: Four times a day (QID) | 11 refills | 0 days | Status: CP
Start: 2023-04-07 — End: ?
  Filled 2023-04-07: qty 100, 25d supply, fill #0

## 2023-04-07 MED ORDER — OXYCODONE 5 MG TABLET
ORAL_TABLET | ORAL | 0 refills | 2 days | Status: CP | PRN
Start: 2023-04-07 — End: 2023-04-12
  Filled 2023-04-07: qty 10, 2d supply, fill #0

## 2023-04-07 MED ORDER — ASPIRIN 81 MG TABLET,DELAYED RELEASE
ORAL_TABLET | Freq: Every day | ORAL | 11 refills | 30 days | Status: CP
Start: 2023-04-07 — End: ?
  Filled 2023-04-07: qty 30, 30d supply, fill #0

## 2023-04-07 MED ORDER — ACYCLOVIR 400 MG TABLET
ORAL_TABLET | Freq: Two times a day (BID) | ORAL | 1 refills | 30 days | Status: CP
Start: 2023-04-07 — End: ?
  Filled 2023-04-22: qty 60, 30d supply, fill #0

## 2023-04-07 MED ORDER — PEN NEEDLE, DIABETIC 32 GAUGE X 5/32" (4 MM)
11 refills | 0 days | Status: CP
Start: 2023-04-07 — End: ?

## 2023-04-07 MED ORDER — MYCOPHENOLATE MOFETIL 250 MG CAPSULE
ORAL_CAPSULE | Freq: Two times a day (BID) | ORAL | 3 refills | 90 days | Status: CP
Start: 2023-04-07 — End: ?

## 2023-04-07 MED ORDER — LETERMOVIR 480 MG TABLET
ORAL_TABLET | Freq: Every day | ORAL | 1 refills | 28 days | Status: CP
Start: 2023-04-07 — End: ?
  Filled 2023-04-07: qty 28, 28d supply, fill #0

## 2023-04-07 MED ORDER — LANCETS
11 refills | 0 days | Status: CP
Start: 2023-04-07 — End: ?
  Filled 2023-04-07: qty 100, 30d supply, fill #0

## 2023-04-07 MED ORDER — AMOXICILLIN 500 MG-POTASSIUM CLAVULANATE 125 MG TABLET
ORAL_TABLET | Freq: Two times a day (BID) | ORAL | 0 refills | 5 days | Status: CP
Start: 2023-04-07 — End: ?
  Filled 2023-04-07: qty 9, 5d supply, fill #0

## 2023-04-07 MED ORDER — METHOCARBAMOL 500 MG TABLET
ORAL_TABLET | Freq: Four times a day (QID) | ORAL | 1 refills | 30 days | Status: CP
Start: 2023-04-07 — End: ?
  Filled 2023-04-07: qty 120, 30d supply, fill #0

## 2023-04-07 MED ORDER — CARVEDILOL 6.25 MG TABLET
ORAL_TABLET | Freq: Two times a day (BID) | ORAL | 11 refills | 30 days | Status: CP
Start: 2023-04-07 — End: ?
  Filled 2023-04-07: qty 60, 30d supply, fill #0

## 2023-04-07 MED ADMIN — carvedilol (COREG) tablet 6.25 mg: 6.25 mg | ORAL | @ 01:00:00

## 2023-04-07 MED ADMIN — oxyCODONE (ROXICODONE) immediate release tablet 10 mg: 10 mg | ORAL | @ 06:00:00 | Stop: 2023-04-07

## 2023-04-07 MED ADMIN — gabapentin (NEURONTIN) capsule 400 mg: 400 mg | ORAL | @ 12:00:00 | Stop: 2023-04-07

## 2023-04-07 MED ADMIN — insulin lispro (HumaLOG) injection 0-9 Units: 0-9 [IU] | SUBCUTANEOUS | @ 17:00:00 | Stop: 2023-04-07

## 2023-04-07 MED ADMIN — acyclovir (ZOVIRAX) capsule 400 mg: 400 mg | ORAL | @ 12:00:00 | Stop: 2023-04-07

## 2023-04-07 MED ADMIN — insulin NPH (HumuLIN,NovoLIN) injection 10 Units: 10 [IU] | SUBCUTANEOUS | @ 17:00:00 | Stop: 2023-04-07

## 2023-04-07 MED ADMIN — insulin lispro (HumaLOG) injection 0-9 Units: 0-9 [IU] | SUBCUTANEOUS | @ 05:00:00 | Stop: 2023-04-07

## 2023-04-07 MED ADMIN — pantoprazole (Protonix) EC tablet 40 mg: 40 mg | ORAL | @ 12:00:00 | Stop: 2023-04-07

## 2023-04-07 MED ADMIN — insulin lispro (HumaLOG) injection 0-9 Units: 0-9 [IU] | SUBCUTANEOUS | @ 13:00:00 | Stop: 2023-04-07

## 2023-04-07 MED ADMIN — tacrolimus (PROGRAF) capsule 5 mg: 5 mg | ORAL | @ 10:00:00 | Stop: 2023-04-07

## 2023-04-07 MED ADMIN — heparin (porcine) 5,000 unit/mL injection 5,000 Units: 5000 [IU] | SUBCUTANEOUS | @ 10:00:00 | Stop: 2023-04-07

## 2023-04-07 MED ADMIN — aspirin chewable tablet 81 mg: 81 mg | ORAL | @ 12:00:00 | Stop: 2023-04-07

## 2023-04-07 MED ADMIN — piperacillin-tazobactam (ZOSYN) 3.375 g in sodium chloride 0.9 % (NS) 100 mL IVPB-MBP: 3.375 g | INTRAVENOUS | @ 10:00:00 | Stop: 2023-04-07

## 2023-04-07 MED ADMIN — methocarbamol (ROBAXIN) tablet 500 mg: 500 mg | ORAL | @ 01:00:00

## 2023-04-07 MED ADMIN — magnesium oxide-Mg AA chelate (Magnesium Plus Protein) 1 tablet: 1 | ORAL | @ 12:00:00 | Stop: 2023-04-07

## 2023-04-07 MED ADMIN — insulin NPH (HumuLIN,NovoLIN) injection 20 Units: 20 [IU] | SUBCUTANEOUS | @ 13:00:00 | Stop: 2023-04-07

## 2023-04-07 MED ADMIN — piperacillin-tazobactam (ZOSYN) 3.375 g in sodium chloride 0.9 % (NS) 100 mL IVPB-MBP: 3.375 g | INTRAVENOUS | @ 05:00:00 | Stop: 2023-04-07

## 2023-04-07 MED ADMIN — magnesium oxide-Mg AA chelate (Magnesium Plus Protein) 1 tablet: 1 | ORAL | @ 01:00:00

## 2023-04-07 MED ADMIN — insulin lispro (HumaLOG) injection 0-9 Units: 0-9 [IU] | SUBCUTANEOUS | @ 01:00:00

## 2023-04-07 MED ADMIN — methocarbamol (ROBAXIN) tablet 500 mg: 500 mg | ORAL | @ 10:00:00 | Stop: 2023-04-07

## 2023-04-07 MED ADMIN — heparin (porcine) 5,000 unit/mL injection 5,000 Units: 5000 [IU] | SUBCUTANEOUS | @ 17:00:00 | Stop: 2023-04-07

## 2023-04-07 MED ADMIN — insulin lispro (HumaLOG) injection 17 Units: 17 [IU] | SUBCUTANEOUS | @ 17:00:00 | Stop: 2023-04-07

## 2023-04-07 MED ADMIN — methylPREDNISolone sodium succinate (PF) (SOLU-Medrol) injection 60 mg: 60 mg | INTRAVENOUS | @ 13:00:00 | Stop: 2023-04-07

## 2023-04-07 MED ADMIN — insulin lispro (HumaLOG) injection 14 Units: 14 [IU] | SUBCUTANEOUS | @ 13:00:00 | Stop: 2023-04-07

## 2023-04-07 MED ADMIN — methylPREDNISolone sodium succinate (PF) (SOLU-Medrol) injection 80 mg: 80 mg | INTRAVENOUS | @ 01:00:00 | Stop: 2023-04-06

## 2023-04-07 MED ADMIN — letermovir (PREVYMIS) tablet 480 mg: 480 mg | ORAL | @ 13:00:00 | Stop: 2023-04-07

## 2023-04-07 MED ADMIN — mycophenolate (CELLCEPT) capsule 250 mg: 250 mg | ORAL | @ 01:00:00

## 2023-04-07 MED ADMIN — methocarbamol (ROBAXIN) tablet 500 mg: 500 mg | ORAL | @ 16:00:00 | Stop: 2023-04-07

## 2023-04-07 MED ADMIN — insulin NPH (HumuLIN,NovoLIN) injection 16 Units: 16 [IU] | SUBCUTANEOUS | @ 01:00:00

## 2023-04-07 MED ADMIN — gabapentin (NEURONTIN) capsule 400 mg: 400 mg | ORAL | @ 01:00:00

## 2023-04-07 MED ADMIN — acyclovir (ZOVIRAX) capsule 400 mg: 400 mg | ORAL | @ 01:00:00

## 2023-04-07 MED ADMIN — methylPREDNISolone sodium succinate (PF) (SOLU-Medrol) injection 60 mg: 60 mg | INTRAVENOUS | @ 16:00:00 | Stop: 2023-04-07

## 2023-04-07 MED ADMIN — amoxicillin-clavulanate (AUGMENTIN) 500-125 mg per tablet 1 tablet: 500 mg | ORAL | @ 16:00:00 | Stop: 2023-04-07

## 2023-04-07 MED ADMIN — gabapentin (NEURONTIN) capsule 400 mg: 400 mg | ORAL | @ 17:00:00 | Stop: 2023-04-07

## 2023-04-07 MED ADMIN — amlodipine (NORVASC) tablet 5 mg: 5 mg | ORAL | @ 12:00:00 | Stop: 2023-04-07

## 2023-04-07 MED ADMIN — carvedilol (COREG) tablet 6.25 mg: 6.25 mg | ORAL | @ 12:00:00 | Stop: 2023-04-07

## 2023-04-07 MED ADMIN — mycophenolate (CELLCEPT) capsule 250 mg: 250 mg | ORAL | @ 12:00:00 | Stop: 2023-04-07

## 2023-04-07 MED FILL — UNIFINE PENTIPS 32 GAUGE X 5/32" NEEDLE (4 MM): 25 days supply | Qty: 100 | Fill #0

## 2023-04-07 MED FILL — LANTUS SOLOSTAR U-100 INSULIN 100 UNIT/ML (3 ML) SUBCUTANEOUS PEN: SUBCUTANEOUS | 60 days supply | Qty: 15 | Fill #0

## 2023-04-07 NOTE — Unmapped (Signed)
Patient's 8/9 liver biopsy reviewed by Dr.Iuga in pathology conference today in the presence of Drs. Celine Mans, S.Kapoor, [place], [place], [facility] and [place]. Dr.Iuga noted minimal portal inflammation, some endothelialitis, and mild bile duct injury, citing minimal ACR. Per Dr.Desai, patient started on steroid and bile duct narrowing found on ercp-stents placed.

## 2023-04-07 NOTE — Unmapped (Addendum)
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 One plastic pancreatic stent was placed into the ventral pancreatic duct. One covered metal biliary stent was placed into the common bile duct (Viabil) across the anastomosis.     Discharge Date:04/07/23    Past Medical History:   Diagnosis Date    Cirrhosis (CMS-HCC)     History of transfusion        Vitals:    04/06/23 2330 04/07/23 0315 04/07/23 0720 04/07/23 1055   BP: 124/85 136/79 146/88 135/94   Pulse: 73 67 66 71   Resp: 16 16 16 18    Temp: 36.9 ??C (98.4 ??F) 37 ??C (98.6 ??F) 36.7 ??C (98.1 ??F) 36.8 ??C (98.2 ??F)   TempSrc: Oral Oral Oral Oral   SpO2: 100% 100% 98% 100%   Weight:       Height:           Lab Results   Component Value Date    A1C 4.6 (L) 02/14/2023       Lab Results   Component Value Date    URICACID 4.0 02/14/2023         Immunosuppression regimen:  Tacrolimus 6 mg BID (dose as of afternoon of 8/14); goal 8-10 ng/mL    Mycophenolate mofetil 250 mg BID    Steroid Taper:   8/10-8/11: methylprednisolone 1000 mg q24  8/12: methylprednisolone 100 mg q12h  8/13: methylprednisolone 80 mg q12h  8/14: methylprednisolone 60 mg q12h  8/15: prednisone 40 mg BID  8/16: prednisone 20 mg BID  8/17 & thereafter: prednisone 20 mg daily    Antimicrobials during admission:   CMV:  D-/R+   Discharge dose: Letermovir 480 mg daily  Goal dose: Letermovir 480 daily   End date: outpatient transplant to determine end duration based on duration of high dose prednisone   HSV:  Acyclovir 400 mg BID   PJP:    Pentamadine x 6 months  End date:08/17/23  ERCP ppx: Zosyn (8/11-8/14) -> Augmentin 500/125 mg (8/14-8/18)     Medication changes to be instituted upon discharge:  Pertinent new medications:  ID: Augmentin through 8/18  CV: amlodipine, carvedilol  Pain: oxycodone  Endo: see taper below  8/14 - 8:15: lantus 25u nightly, lispro 9u TIDAC, + correction (1:25>150 ACHS)  8/16 & thereafter: lantus 16u nightly, lispro 9u TIDAC, + correction (1:25>150 ACHS)    Changes in home medications:  IS: prednisone taper as above    Home medications stopped:  Calcium carbonate, enoxaparin, lidocaine patch      Suggested monitoring for outpatient follow-up:   IS: Will differ end date of prednisone taper to outpatient transplant team.  ID: Outpatient transplant team will follow up with letermovir/acyclovir end date pending duration/dose of prednisone  Endo: Outpatient transplant team will follow up with glucose and insulin needs during prednisone taper.   CV: Continue to monitor BP/HR outpatient as steroids continue to decrease.    Jeoffrey Massed, PharmD

## 2023-04-07 NOTE — Unmapped (Signed)
Tacrolimus Therapeutic Monitoring Pharmacy Note    Alicia Armstrong is a 47 y.o. female continuing tacrolimus.     Indication: Liver transplant     Date of Transplant:  02/15/2023       Prior Dosing Information: Current regimen Tacrolimus 5 mg BID; started 8/13 PM      Source(s) of information used to determine prior to admission dosing: Home Medication List    Goals:  Therapeutic Drug Levels  Tacrolimus trough goal: 8-10 ng/mL    Additional Clinical Monitoring/Outcomes  Monitor renal function (SCr and urine output) and liver function (LFTs)  Monitor for signs/symptoms of adverse events (e.g., hyperglycemia, hyperkalemia, hypomagnesemia, hypertension, headache, tremor)    Results:   Tacrolimus level:  6.5 ng/mL, drawn appropriately    Pharmacokinetic Considerations and Significant Drug Interactions:  Concurrent hepatotoxic medications: None identified  Concurrent CYP3A4 substrates/inhibitors: None identified  Concurrent nephrotoxic medications: None identified    Assessment/Plan:  Recommendedation(s)  Increase to tacrolimus 6 mg BID    Follow-up  Daily tacrolimus levels .   A pharmacist will continue to monitor and recommend levels as appropriate    Please page service pharmacist with questions/clarifications.    Ardeen Garland, PharmD Candidate

## 2023-04-07 NOTE — Unmapped (Signed)
Alimentaci??n: normal    Otras instrucciones (si corresponde): contin??e con la reducci??n gradual de esteroides seg??n las instrucciones    Su coordinadora de h??gado postrasplante es Alicia Armstrong (consulte la informaci??n de contacto al final de las instrucciones). Comun??quese con su coordinador de trasplantes o con la [facility] [phone]) durante el horario comercial o llame al operador del hospital [phone]) y pregunte por el coordinador de trasplante de h??gado de guardia (liver transplant coordinator on call en ingl??s) si est?? fuera del horario comercial si tiene alguna inquietud o pregunta o si padece lo siguiente:    -Fiebre de m??s de 100.5 grados F medida por la boca, cualquier fiebre acompa??ada de escalofr??os u otros s??ntomas de infecci??n.  -N??useas incontrolables, v??mitos, diarrea, si no puede defecar durante m??s de 3 d??as.  - Cualquier problema que evite que tome los medicamentos a las horas indicadas.  -Dolor que no se controle con los medicamentos recetados, un nuevo dolor o sensibilidad en el ??rea de la cirug??a.  -Un aumento repentino de peso o de la presi??n arterial (m??s de 140/85)  -Si le falta la respiraci??n, dolor de pecho o molestia.  -Ictericia nueva o aumento de la ictericia.  -S??ntomas urinarios incluyendo dolor, dificultad, ardor u orina que tenga color de t??.  -Cualquier otro s??ntoma nuevo o preocupante.  -Preguntas acerca de sus medicamentos o de su cuidado m??dico.    No maneje ni opere maquinaria pesada hasta que su doctor le d?? autorizaci??n para hacerlo. No maneje ni opere maquinaria pesada mientras est?? tomando narc??ticos.    Inspeccione el lugar de la cirug??a por lo [facility] al d??a, comun??quese con la coordinadora de trasplantes si nota enrojecimiento que se esparce, secreci??n purulenta, le aumenta el sangrado o drenaje, o si se le separan las heridas.    Anote a diario sus signos vitales, de acuerdo a las instrucciones del manual.     Anote los medicamentos que ha tomado y comp??relo con la hoja de medicamentos del alta.  Revise peri??dicamente su Manual del trasplante para enterarse de la informaci??n importante sobre el cuidado posoperatorio y las precauciones necesarias.      Coordinador de Postrasplante de H??gado:  Alicia Armstrong- tel: [phone] fax: [phone].      Tiene alergias a lo siguiente  [employer] manager (ver los comentarios)   Tuvo sarpullido con penicilina cuando ten??a 8 a??os. Toler?? piperacillin/tazobactam de 2 enero de 2018 a 5 enero de 2018 con comez??n sin sarpullido.

## 2023-04-07 NOTE — Unmapped (Addendum)
Endocrine Team Diabetes Treatment Plan    Discharge planning:  For discharge, patient will be discharged on steroid taper which will be 40mg  BID on 8/15, 20mg  BID on 8/16, and 20mg  daily starting on 8/17    For 8/14, given patient will be receiving 50% less steroid dose tomorrow, would recommend taking:  - Take Lantus 30units tonight (8/14)    For 8/15:  - Take lantus 22units at night  - Take lispro 11u TIDAC  - Continue correction scale    For 8/16 onwards:  - Take Lantus 16units nightly (0.25u/kg)  - Take lispro 8u TIDAC  - Continue correction scale      Would recommend monitoring Bgs at home closely and if persistently elevated, would recommend contacting PCP or transplant pharmacy team to assist with insulin adjustments as steroid dose is further tapered.     2:01 PM- after discussion with primary team, patient would prefer more simplified home regimen. Given this, will plan to send home on:    For 8/14-8/15:  Basal: lantus 25 units  Nutritional lispro 9units TIDAC    For 8/16 onwards:  Basal: lantus 16units at night  Nutritional: lispro 9u TIDAC    Continue correction 1:25>150 ACHS

## 2023-04-07 NOTE — Unmapped (Signed)
Transplant Nutrition Note     Patient requested RD consult.   I utilized interpreter services for communication assistance between [place] and Spanish (Interpreter Name: Dalbert Batman).     Patient was concerned that the fresh vegetables she was eating was contributing to her increased immune function and reason for readmission. Encouraged patient to continue consuming cooked vegetables, as explained that it is unlikely that vegetable intake could cause issues with rejection/immune response. Discouraged intake of grapefruit/pomegranate given medication interaction.   Patient was happy to learn that she could keep eating vegetables.       Lanelle Bal, RD, LDN, CCTD  Abdominal Transplant Dietitian   Pager: [phone]

## 2023-04-07 NOTE — Unmapped (Signed)
Diabetes education consultation: Consulted for assistance with providing instruction on diabetes self-management skills. Visited with Alicia Armstrong at the bedside. Provided 40 minutes of diabetes education with Spanish interpreter via phone.     Reason for consult:   blood glucose monitoring, insulin instruction, insulin pen teaching  Admitted Date: 03/30/23  EDD: 04/07/23  Consult orders reviewed with: Daivd Council, PA; Marisue Humble, PA    Insurance: Kawanna Sureya Familia has Medicaid and [employer].     Assessment: Alicia Armstrong admitted with abnormal LFTs with concern for rejection of transplanted liver.  Alicia Armstrong has steroid-induced hyperglycemia and is requiring insulin with her steroid taper at discharge. She reports she has a glucometer at home and already monitors her blood glucose once or twice a day at home. She also had used SSI after her previous discharge. Reviewed new insulin plan based on Endo recommendations.     A1C:    Lab Results   Component Value Date    A1C 4.6 (L) 02/14/2023    EAG 85 02/14/2023       Insulin administration & safety: Discussed current regimen, explaining long-acting vs rapid acting action, timing of administration, described correctional sliding scale & connection to glucose monitoring. Provided filled out instructional sheets per Endo Treatment Plan note.  Instructed on insulin administration with pens and insulin safety - including injection site selection & rotation, insulin storage, sharps disposal & discard/expiration date. Alicia Armstrong returned demonstration using demo equipment & did well, stating she felt confident with ability.  Encouraged Alicia Armstrong if they have further questions to let the nurse know and they can page the diabetes educator to come back to assist.    Problem-solving:   Hypoglycemia:    Instructed on hypoglycemia recognition & treatment, including 15-15 rule, listing several examples of appropriate fast carb sources, emphasizing importance of having something readily available. Described possible causes and prevention.  Instructed to contact provider for unexplained episode, an episode requiring more than 1 treatment, or 2 episodes within a 2-3 day period, for possible dose adjustment.    Hyperglycemia:  Instructed on hyperglycemia recognition and treatment. Described possible causes and prevention and when to contact provider.     Monitoring: Provided with copies of large print log sheets to maintain a record of readings & instructed to bring to follow-up visits with provider for evaluation of treatment plan.    Monitoring Supplies needing prescriptions at discharge:  Please order glucose strips, lancets, and pen needles covered by insurance    Recommendations: At f/u visit with PCP request referral for DSMES (Diabetes Self Management Education and Support) after discharge.       Plan: Thank you for this consult! Please feel free to reach out with any questions!  Thank You,   Dyane Dustman, MSN, RN, Diabetes Nurse Educator- pager: [phone]

## 2023-04-07 NOTE — Unmapped (Signed)
Pt is A&O x4 and verbalizes understanding of POC.  Pt had an ERCP today, multiple stents placed per PACU report.  OOB independently this shift, no falls.    Tolerating regular diet, no complaints of n/v.    Pt had notable pain after arriving to unit from ERCP, MD notified and PRN Oxy ordered.  Oxy 5mg  insufficient to control pain, MD notified again and now has PRN 5-10mg  of Oxy.    VSS, pt remains afebrile.  No BM this shift  adequate UOP  No complaints or concerns.  Will continue to monitor.    Problem: Adult Inpatient Plan of Care  Goal: Plan of Care Review  Outcome: Progressing  Goal: Patient-Specific Goal (Individualized)  Outcome: Progressing  Goal: Absence of Hospital-Acquired Illness or Injury  Outcome: Progressing  Intervention: Prevent and Manage VTE (Venous Thromboembolism) Risk  Recent Flowsheet Documentation  Taken 04/06/2023 1826 by Calahan Pak, Milderd Meager, RN  Anti-Embolism Device Type: SCD, Knee  Anti-Embolism Intervention: Refused  Anti-Embolism Device Location: BLE  Taken 04/06/2023 1600 by Juniper Cobey, Milderd Meager, RN  Anti-Embolism Device Type: SCD, Knee  Anti-Embolism Intervention: Refused  Anti-Embolism Device Location: BLE  Goal: Optimal Comfort and Wellbeing  Outcome: Progressing  Goal: Readiness for Transition of Care  Outcome: Progressing  Goal: Rounds/Family Conference  Outcome: Progressing     Problem: Pain Acute  Goal: Optimal Pain Control and Function  Outcome: Progressing

## 2023-04-07 NOTE — Unmapped (Signed)
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 *  Resolved Problems:    * No resolved hospital problems. *      OR Procedures:    ENDOSCOPIC RETROGRADE CHOLANGIOPANCREATOGRAPHY (ERCP); WITH PLACEMENT OF ENDOSCOPIC STENT INTO BILIARY OR PANCREATIC DUCT  ERCP; W/SPHINCTEROTOMY/PAPILLOTOMY  ENDOSCOPIC CATHETERIZATION OF THE BILIARY DUCTAL SYSTEM, RADIOLOGICAL SUPERVISION AND INTERPRETATION  Date  04/06/2023  -------------------     Ancillary Procedures:  TJ liver biopsy 8/9, ERCP 8/13    Discharge Day Services:     Subjective   No acute events overnight. Pain Controlled. No fever or chills.    Objective   Patient Vitals for the past 8 hrs:   BP Temp Temp src Pulse Resp SpO2   04/07/23 1055 135/94 36.8 ??C (98.2 ??F) Oral 71 18 100 %   04/07/23 0720 146/88 36.7 ??C (98.1 ??F) Oral 66 16 98 %     I/O this shift:  In: 390 [P.O.:290; IV Piggyback:100]  Out: 700 [Urine:700]    General Appearance:   No acute distress  Lungs:                Clear to auscultation bilaterally  Heart:                           Regular rate and rhythm  Abdomen:                Soft, non-tender, non-distended  Extremities:              Warm and well perfused    Hospital Course:  Alicia Armstrong 47 y.o. 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 Patient underwent a ultrasound which showed patent vasculature. She underwent a transjugular liver biopsy on 04/02/23 showing mild acute cellular rejection. High dose steriod treatment was started. Unfortunately the day following the biopsy she had a rising total bilirubin but overall stable transaminases. Gastroenterology performed an MRCP with CBD metal stent placement at the site of the anastomosis. During the procedure a pancreatic duct was also cannulated and a pancreatic duct plastic stent was place.     At time of discharge her total bilirubin was improving and she had finished all required IV steroids. She was tolerating a regular diet. She was voiding adequately, ambulating independently, and her pain was controlled wth PO pain medications. She was examined by the Surgery team on the day of discharge and was deemed suitable for discharge home. She will be discharged in stable condition.     I spent great than 30 minutes completing this discharge.         Condition at Discharge: Improved  Discharge Medications:      Medication List      START taking these medications     amlodipine 5 MG tablet; Commonly known as: NORVASC; Take 1 tablet (5 mg   total) by mouth daily.; Start taking on: April 08, 2023   amoxicillin-clavulanate 500-125 mg per tablet; Commonly known as:   AUGMENTIN; Take 1 tablet by mouth two (2) times a  day.   carvedilol 6.25 MG tablet; Commonly known as: COREG; Take 1 tablet (6.25   mg total) by mouth two (2) times a day.   insulin glargine 100 unit/mL (3 mL) injection pen; Commonly known as:   BASAGLAR, LANTUS; Inject 0.16-0.3 mL (16-30 Units total) under the skin   nightly. Inject 30 units in the evening on 8/14, then inject 22 units in   the evening on 8/15, and then inject 16 units in the evening thereafter   insulin lispro 100 unit/mL injection pen; Commonly known as: HumaLOG;   Inject 8-14 Units under the skin Three (3) times a day before meals.   Inject 8 to 11 units three times a day with meals and inject 1 unit for   every 25 mg/dL over 595 mg/dL three times a day with meals   oxyCODONE 5 MG immediate release tablet; Commonly known as: ROXICODONE;   Take 1 tablet (5 mg total) by mouth every four (4) hours as needed for up   to 5 days.     CHANGE how you take these medications     tacrolimus 1 MG capsule; Commonly known as: PROGRAF; Take 5 capsules (5   mg total) by mouth two (2) times a day.; What changed: See the new   instructions.     CONTINUE taking these medications     ACCU-CHEK SOFTCLIX LANCETS lancets; Generic drug: lancets; Use to check   blood sugar as directed with insulin 3 times a day & for symptoms of high   or low blood sugar.   acetaminophen 325 MG tablet; Commonly known as: Tylenol; Take 1-2   tablets (325-650 mg total) by mouth every eight (8) hours as needed for   pain or fever.   acyclovir 400 MG tablet; Commonly known as: ZOVIRAX; Tome 1 p??ldora por   v??a oral dos veces al d??a. (Dosis = 400 mg); (Take 1 tablet (400 mg total)   by mouth two (2) times a day.)   aspirin 81 MG tablet; Commonly known as: ECOTRIN; Tome 1 p??ldora por v??a   oral una vez al d??a. (Dosis = 81 mg); (Take 1 tablet (81 mg total) by   mouth daily.)   docusate sodium 100 MG capsule; Commonly known as: COLACE; Use seg??n sea   necesario para el estre??imiento. Tome 1 p??ldora por v??a oral dos veces al   d??a. (Dosis = 100 mg); (Take 1 capsule (100 mg total) by mouth two (2)   times a day as needed for constipation.)   gabapentin 400 MG capsule; Commonly known as: NEURONTIN; Tome 1 p??ldora   por v??a oral tres veces al d??a. (Dosis = 400 mg); (Take 1 capsule (400 mg   total) by mouth Three (3) times a day.)   HEALTHYLAX 17 gram packet; Generic drug: polyethylene glycol; Mix 1   packet in  4 to 8 ounces of liquid and drink daily as needed.   letermovir 480 mg tablet; Commonly known as: PREVYMIS; Tome 1 p??ldora   por v??a oral una vez al d??a. (Dosis = 480 mg); (Take 1 tablet (480 mg   total) by mouth daily.)   magnesium (amino acid chelate) 133 mg tablet; Generic drug: magnesium   oxide-Mg AA chelate; Tome 1 p??ldora por v??a oral dos veces al d??a.; (Take   1 tablet by mouth two (2) times a day.)   methocarbamol 500 MG tablet; Commonly known as: ROBAXIN; Tome 1 p??ldora   por v??a oral cuatro veces al d??a. (Dosis =  500 mg); (Take 1 tablet (500 mg total) by mouth four (4) times a day.)   mycophenolate 250 mg capsule; Commonly known as: CELLCEPT; Tome 1   p??ldora por v??a oral dos veces al d??a. (Dosis = 250 mg); (Take 1 capsule   (250 mg total) by mouth two (2) times a day.)   pantoprazole 40 MG tablet; Commonly known as: Protonix; Tome 1 p??ldora   por v??a oral una vez al d??a. (Dosis = 40 mg); (Take 1 tablet (40 mg total)   by mouth daily.)   pen needle, diabetic 32 gauge x 5/32 (4 mm) Ndle; Use with insulin up   to 4 times/day as needed.   pentamidine in sterile water; Inhale 6 mL (300 mg total) every   twenty-eight (28) days.   SITagliptin phosphate 100 MG tablet; Commonly known as: JANUVIA; Tome 1   p??ldora por v??a oral una vez al d??a. (Dosis = 100 mg); (Take 1 tablet (100   mg total) by mouth daily.)     STOP taking these medications     calcium carbonate 200 mg calcium (500 mg) chewable tablet; Commonly   known as: TUMS   enoxaparin 40 mg/0.4 mL Syrg; Commonly known as: LOVENOX   LIDOCAINE PAIN RELIEF 4 % patch; Generic drug: lidocaine   predniSONE 5 MG tablet; Commonly known as: DELTASONE       Pending Test Results:     Discharge Instructions:  Activity:     Diet:    Other Instructions:  Other Instructions       Discharge instructions      Diet: regular    Other Instructions (if applicable): continue your steroid taper as instructed    Your Liver Post-Transplant Coordinator is Emilio Math (see contact info at bottom of instructions). Contact your transplant coordinator or the Transplant Surgery Office [phone]) during business hours or call the hospital operator [phone]) and ask for the liver transplant coordinator on call if after business hours for any concerns/questions or the following:    - fever >100.5 degrees F by mouth, any fever with shaking chills, or other signs or symptoms of infection   - uncontrolled nausea, vomiting, or diarrhea; inability to have a bowel movement for > 3 days.   - any problem that prevents taking medications as scheduled.   - pain uncontrolled with prescribed medication or new pain or tenderness at the surgical site   - sudden weight gain or increase in blood pressure (greater than 140/85)   - shortness of breath, chest pain / discomfort   - new or increasing jaundice   - urinary symptoms including pain / difficulty / burning or tea-colored urine   - any other new or concerning symptoms   - questions regarding your medications or continuing care    Do not drive or operate heavy machinery prior to MD clearance, or at any time while taking narcotics.    Inspect surgical sites at least twice daily, contact Transplant Coordinator for spreading redness, purulent discharge, or increasing bleeding or drainage, or for separation of wounds.     Maintain a written record of daily vital signs, per Handbook instructions.     Maintain a written record of medications taken and review against the discharge medications sheet. Periodically review your Transplant Handbook for important information regarding postoperative care and required precautions.      Liver Post-Transplant Coordinator:  Emilio Math- phone: [phone] fax: [phone]          Labs and Other Follow-ups  after Discharge:  Follow Up instructions and Outpatient Referrals     Discharge instructions          Future Appointments:  Appointments which have been scheduled for you      Apr 12, 2023 10:00 AM  (Arrive by 9:30 AM)  LAB ONLY with SURTRA LAB ONLY  [facility] TRANSPLANT SURGERY [place] [facility] REGION) [address]  [place] [address]  [phone]        Apr 12, 2023 12:30 PM  (Arrive by 12:00 PM)  RETURN PHARMD with TRANSPLANT SURGERY PHARMACY  [facility] TRANSPLANT SURGERY [place] [facility] REGION) [address]  [place] [address]  [phone]        Apr 12, 2023 1:00 PM  (Arrive by 12:30 PM)  RETURN 15 with Chirag Lanney Gins, MD  [facility] TRANSPLANT SURGERY [place] [facility] REGION) [address]  [place] [address]  [phone]        Apr 22, 2023 2:00 PM  (Arrive by 1:30 PM)  NURSE  30 with [facility]  [facility] TRANSPLANT SURGERY [place] [facility] REGION) [address]  [place] [address]  [phone]        May 17, 2023 11:30 AM  (Arrive by 11:00 AM)  RETURN PHARMD with TRANSPLANT SURGERY PHARMACY  [facility] TRANSPLANT SURGERY [place] [facility] REGION) [address]  [place] [address]  [phone]        May 17, 2023 12:45 PM  (Arrive by 12:15 PM)  RETURN 15 with Chirag Lanney Gins, MD  [facility] TRANSPLANT SURGERY [place] [facility] REGION) [address]  [place] [address]  [phone]        May 18, 2023 10:00 AM  (Arrive by 9:30 AM)  RETURN VIDEO HCP DIRECT LINK with Jorja Loa Bridgens, RD/LDN  [facility] TRANSPLANT [place])  Arrive at: This is a Video Visit [address]  [place] [address]  [phone]   Su proveedor m??dico le enviar?? un enlace directo en el momento de su cita por video. NO vaya a la cl??nica.       Para la visita por video, usted necesita una computadora con c??mara, altavoz y micr??fono que funcionen bien o un tel??fono celular o una tableta electr??nica con acceso a internet.        May 20, 2023 12:00 PM  (Arrive by 11:30 AM)  RETURN VIDEO HCP DIRECT LINK with Eliezer Bottom, LCSW  [facility] LIVER TRANSPLANT  ([place])  Arrive at: This is a Video Visit [address]  [place] [address]  [phone]   Su proveedor m??dico le enviar?? un enlace directo en el momento de su cita por video. NO vaya a la cl??nica.       Para la visita por video, usted necesita una computadora con c??mara, altavoz y micr??fono que funcionen bien o un tel??fono celular o una tableta electr??nica con acceso a internet.        May 20, 2023 2:00 PM  (Arrive by 1:30 PM)  NURSE  30 with [facility]  [facility] TRANSPLANT SURGERY  [facility] REGION) [address]  [place] [address]  [phone]        Jun 17, 2023 2:00 PM  (Arrive by 1:30 PM)  NURSE  30 with SURTRA NURSE  [facility] TRANSPLANT SURGERY [place] [facility] REG[place]) [address]  [place] [address]  [phone]        Jul 15, 2023 2:00 PM  (Arrive by 1:30 PM)  NURSE  30 with [facility]  [facility] TRANSPLANT SURGERY [facility] REGION) [address]  [place] [address]  [phone]        Aug 05, 2023 9:30 AM  (Arrive by 9:00 AM)  RETURN VIDEO HCP MYCHART with Suzan Nailer, PsyD  [facility] TRANSPLANT SURGERY [place] [facility] REGION) [address]  [place] [address]  [phone]   Inicie sesi??n en su cuenta de My [facility] Region Chart al menos 15 minutos antes de la cita para completar el proceso de registro electr??nico ??eCheck-In??Marland Kitchen Antes de iniciar la visita por video debe registrarse electr??nicamente. Tambi??n recomendamos que compruebe su conexi??n de audio y video para poder solucionar cualquier problema antes de que comience la visita. Haga clic en ????nase a visita por video?? para Geologist, engineering. Una vez que se haya registrado electr??nicamente y comprobado su audio y video, haga clic en ????nase a la llamada?? para conectarse a la visita.       Para la visita por video, usted necesita una computadora con c??mara, altavoz y micr??fono que funcionen bien o un tel??fono celular inteligente o una tableta electr??nica con acceso a internet.      My [place] Chart le permite organizar su salud, enviar mensajes no urgentes a su proveedor m??dico, ver los resultados de sus pruebas, Charity fundraiser y Chief Strategy Officer citas y reponer sus recetas de [employer] segura y conveniente desde su computadora o dispositivo m??vil.      Visite [email or web address] para iniciar una sesi??n de My [place] Chart con su nombre de usuario y contrase??a.  Si olvida su nombre de usuario o contrase??a, seleccione los enlaces ????Olvid?? su nombre de usuario??? u ????Olvid?? su contrase??a??? para acceder a su cuenta. Tambi??n puede acceder a su cuenta de My [place] Chart utilizando la aplicaci??n m??vil gratuita MyChart para Tour manager.      Si necesita ayuda para acceder a su cuenta de My [place] Chart o para comunicarse con el consultorio de su proveedor m??dico para programar de nuevo o cancelar su cita, llame a [facility] al [phone].        Aug 05, 2023 2:00 PM  (Arrive by 1:30 PM)  RETURN VIDEO HCP [employer] with TRANSPLANT SURGERY PHARMACY  [place] TRANSPLANT SURGERY [place] [facility] REGION) [address]  [place] [address]  [phone]   Inicie sesi??n en su cuenta de My [facility] Chart al menos 15 minutos antes de la cita para completar el proceso de registro electr??nico ??eCheck-In??Marland Kitchen Antes de iniciar la visita por video debe registrarse electr??nicamente. Tambi??n recomendamos que compruebe su conexi??n de audio y video para poder solucionar cualquier problema antes de que comience la visita. Haga clic en ????nase a visita por video?? para Geologist, engineering. Una vez que se haya registrado electr??nicamente y comprobado su audio y video, haga clic en ????nase a la llamada?? para conectarse a la visita.       Para la visita por video, usted necesita una computadora con c??mara, altavoz y micr??fono que funcionen bien o un tel??fono celular inteligente o una tableta electr??nica con acceso a internet.      My [place] Chart le permite organizar su salud, enviar mensajes no urgentes a su proveedor m??dico, ver los [employer] de sus  pruebas, programar y Chief Strategy Officer citas y reponer sus recetas de [employer] segura y conveniente desde su computadora o dispositivo m??vil.      Visite [email or web address] para iniciar una sesi??n de My [place] Chart con su nombre de usuario y contrase??a.  Si olvida su nombre de usuario o contrase??a, seleccione los enlaces ????Olvid?? su nombre de usuario??? u ????Olvid?? su contrase??a??? para acceder a su cuenta. Tambi??n puede acceder a su cuenta de My [place] Chart utilizando la aplicaci??n m??vil gratuita MyChart para Tour manager.      Si necesita ayuda para acceder a su cuenta de My [place] Chart o para comunicarse con el consultorio de su proveedor m??dico para programar de nuevo o cancelar su cita, llame a [facility] al [phone].        Aug 10, 2023 10:00 AM  (Arrive by 9:30 AM)  RETURN  HEPATOLOGY with Janyth Pupa, MD  [facility] LIVER TRANSPLANT [place] [facility] REGION) [address]  [place] [address]  [phone]        Aug 12, 2023 2:00 PM  (Arrive by 1:30 PM)  NURSE  30 with [facility]  [facility] TRANSPLANT SURGERY [place] [facility] REGION) [address]  [place] [address]  [phone]        Nov 15, 2023 2:00 PM  (Arrive by 1:30 PM)  RETURN VIDEO HCP MYCHART with TRANSPLANT SURGERY PHARMACY  [facility] TRANSPLANT SURGERY [place] [facility] REGION) [address]  [place] [address]  [phone]   Inicie sesi??n en su cuenta de My [facility] Chart al menos 15 minutos antes de la cita para completar el proceso de registro electr??nico ??eCheck-In??Marland Kitchen Antes de iniciar la visita por video debe registrarse electr??nicamente. Tambi??n recomendamos que compruebe su conexi??n de audio y video para poder solucionar cualquier problema antes de que comience la visita. Haga clic en ????nase a visita por video?? para Geologist, engineering. Una vez que se haya registrado electr??nicamente y comprobado su audio y video, haga clic en ????nase a la llamada?? para conectarse a la visita.       Para la visita por video, usted necesita una computadora con c??mara, altavoz y micr??fono que funcionen bien o un tel??fono celular inteligente o una tableta electr??nica con acceso a internet.      My [place] Chart le permite organizar su salud, enviar mensajes no urgentes a su proveedor m??dico, ver los resultados de sus pruebas, Charity fundraiser y Chief Strategy Officer citas y reponer sus recetas de [employer] segura y conveniente desde su computadora o dispositivo m??vil.      Visite [email or web address] para iniciar una sesi??n de My [place] Chart con su nombre de usuario y contrase??a.  Si olvida su nombre de usuario o contrase??a, seleccione los enlaces ????Olvid?? su nombre de usuario??? u ????Olvid?? su contrase??a??? para acceder a su cuenta. Tambi??n puede acceder a su cuenta de My [place] Chart utilizando la aplicaci??n m??vil gratuita MyChart para Tour manager.      Si necesita ayuda para acceder a su cuenta de My [place] Chart o para comunicarse con el consultorio de su proveedor m??dico para programar de nuevo o cancelar su cita, llame a [facility] al [phone].        Nov 16, 2023 10:00 AM  (Arrive by 9:30 AM)  RETURN  HEPATOLOGY with Janyth Pupa, MD  [facility] LIVER TRANSPLANT [place]) [address]  [place] [address]  [phone]        Feb 14, 2024 2:00 PM  (Arrive by 1:30 PM)  RETURN VIDEO HCP [facility] with TRANSPLANT SURGERY PHARMACY  [facility] TRANSPLANT SURGERY [place] [facility] REGION) [address]  [place] [address]  [phone]   Inicie sesi??n en su cuenta de My [facility] Chart al menos 15 minutos antes de la cita para completar el proceso de registro electr??nico ??eCheck-In??Marland Kitchen Antes de iniciar la visita por video debe registrarse electr??nicamente. Tambi??n recomendamos que compruebe su conexi??n de audio y video para poder solucionar cualquier problema antes de que comience la visita. Haga clic en ????nase a visita por video?? para Geologist, engineering. Una vez que se haya registrado electr??nicamente y comprobado su audio y video, haga clic en ????nase a la llamada?? para conectarse a la visita.       Para la visita por video, usted necesita una computadora con c??mara, altavoz y micr??fono que funcionen bien o un tel??fono celular inteligente o una tableta electr??nica con acceso a internet.      My [place] Chart le permite organizar su salud, enviar mensajes no urgentes a su proveedor m??dico, ver los resultados de sus pruebas, Charity fundraiser y Chief Strategy Officer citas y reponer sus recetas de [employer] segura y conveniente desde su computadora o dispositivo m??vil.      Visite [email or web address] para iniciar una sesi??n de My [place] Chart con su nombre de usuario y contrase??a.  Si olvida su nombre de usuario o contrase??a, seleccione los enlaces ????Olvid?? su nombre de usuario??? u ????Olvid?? su contrase??a??? para acceder a su cuenta. Tambi??n puede acceder a su cuenta de My [place] Chart utilizando la aplicaci??n m??vil gratuita MyChart para Tour manager.      Si necesita ayuda para acceder a su cuenta de My [place] Chart o para comunicarse con el consultorio de su proveedor m??dico para programar de nuevo o cancelar su cita, llame a [facility] al [phone].        Feb 15, 2024 10:30 AM  (Arrive by 10:00 AM)  RETURN  HEPATOLOGY with Janyth Pupa, MD  [facility] LIVER TRANSPLANT [place] [facility] REGION) [address]  [place] [address]  [phone]

## 2023-04-07 NOTE — Unmapped (Signed)
Pt AO x4, VSS, NAD   Pt c/o 6/10 pain given PRN oxy  Abd incision OTA, c/d/i   Ambulated in room, no assistive device, tolerated well   No falls/injuries this shift     Problem: Adult Inpatient Plan of Care  Goal: Plan of Care Review  Outcome: Ongoing - Unchanged  Goal: Patient-Specific Goal (Individualized)  Outcome: Ongoing - Unchanged  Goal: Absence of Hospital-Acquired Illness or Injury  Outcome: Ongoing - Unchanged  Intervention: Identify and Manage Fall Risk  Recent Flowsheet Documentation  Taken 04/06/2023 2000 by Raechel Ache, RN  Safety Interventions:   fall reduction program maintained   low bed   nonskid shoes/slippers when out of bed  Intervention: Prevent Skin Injury  Recent Flowsheet Documentation  Taken 04/06/2023 2000 by Raechel Ache, RN  Positioning for Skin: Supine/Back  Device Skin Pressure Protection: absorbent pad utilized/changed  Skin Protection: adhesive use limited  Intervention: Prevent and Manage VTE (Venous Thromboembolism) Risk  Recent Flowsheet Documentation  Taken 04/06/2023 2000 by Raechel Ache, RN  Anti-Embolism Device Type: SCD, Knee  Anti-Embolism Intervention: Refused  Intervention: Prevent Infection  Recent Flowsheet Documentation  Taken 04/06/2023 2000 by Raechel Ache, RN  Infection Prevention: cohorting utilized  Goal: Optimal Comfort and Wellbeing  Outcome: Ongoing - Unchanged  Goal: Readiness for Transition of Care  Outcome: Ongoing - Unchanged  Goal: Rounds/Family Conference  Outcome: Ongoing - Unchanged     Problem: Fall Injury Risk  Goal: Absence of Fall and Fall-Related Injury  Intervention: Promote Injury-Free Environment  Recent Flowsheet Documentation  Taken 04/06/2023 2000 by Raechel Ache, RN  Safety Interventions:   fall reduction program maintained   low bed   nonskid shoes/slippers when out of bed

## 2023-04-07 NOTE — Unmapped (Addendum)
Endocrine Team Diabetes Follow Up Consult Note     Consult information:  Requesting Attending Physician : Gemma Payor, MD  Service Requesting Consult : Surg Transplant [phone])  Primary Care Provider: Doris Cheadle, MD  Impression:  Alicia Armstrong is a 47 y.o. 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 We have been consulted at the request of Gemma Payor, MD to evaluate Alicia Armstrong for hyperglycemia.     Medical Decision Making:  Diagnoses:  1. History of steroid induced-diabetes  With severe hyperglycemia last 24 hours.  2. Nutrition: Complicating glycemic control. Increasing risk for both hypoglycemia and hyperglycemia.  3. Transplant. Complicating glycemic control and increasing risk for hyperglycemia.  4. Steroids. Complicating glycemic control and increasing risk for hyperglycemia.      Studies reviewed 04/07/23:  Labs: CBC, BMP, POCT-BG, and HbA1C  Interpretation: Leukopenia noted. Anemia noted. Normal sodium. Normal potassium. Hyperglycemia, some severe. A1C in non-diabetes range No kidney dysfunction noted. Elevated LFTs, improving overall .  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 Now with severe steroid induced hyperglycemia, c/b missed meal coverage last night. FPG 226 mg/dL. Will increase basal and nutritional insulin by ~20% today and continue to monitor closely. Of note, steroids continue to taper daily, now at 60 mg of methylprednisolone BID.    Recommendations:  - NPH 20 units q12 hours    - give 75% if NPO  - Lispro 14 units TID AC  - Lispro correction factor 20 5x daily  (ACHS + 3am)  - POCT-BG 5 times a day.  - Hypoglycemia protocol.  - Ensure patient is on glucose precautions if patient taking nutrition by mouth.     - Diabetes education consulted 8/14    Discharge planning:  In process. Will complete closer to discharge.Anticipate being able to discharge off antihyperglycemic agents.     Thank you for this consult. Discussed plan with primary team. We will continue to follow and make recommendations and place orders as appropriate.    Please page with questions or concerns: Daivd Council, [place]: [phone]  Endocrinology Diabetes Care Team on call from 6AM - 3PM on weekdays then endocrine fellow on call: [phone] from 3PM - 6AM on weekdays and on weekends and holidays.   If APP cannot be reached, please page the endocrine fellow on call.      Subjective:  Interval History:  Patient s/p ERCP and stent placement yesterday. She reports feeling better this AM. Denies SOB, check pain, abdominal pain, or N/V. Tolerating a regular diet. Reports eating two meals after returning from her procedure yesterday.    Initial HPI:  Alicia Armstrong is a 47 y.o. 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 We have been consulted at the request of Gemma Payor, MD to evaluate Alicia Armstrong for presumptive hyperglycemia in the setting of high dose steroids. After surgery, she required insulin during high dose steroids - though she has no history of diabetes prior to transplant. Glucose during current hospital stay have been in the mid 100s requiring minimal sliding scale insulin.     Diabetes History:  Patient has a history of  steroid induced hyperglycemia  diagnosed during last hospitalization.  Diabetes is managed by: n/a.  Current home diabetes regimen: none.  Current home blood glucose monitoring: 2x a day, blood sugars running in the low 100s.  Hypoglycemia awareness: n/a.  Complications related to diabetes: none known      Current Nutrition:  Active Orders   Diet    Nutrition Therapy Regular/House       ROS: As per HPI.     acyclovir  400 mg Oral BID    amlodipine  5 mg Oral Daily    aspirin  81 mg Oral Daily    carvedilol  6.25 mg Oral BID    gabapentin  400 mg Oral TID    heparin (porcine) for subcutaneous use  5,000 Units Subcutaneous Q8H SCH    insulin lispro  0-9 Units Subcutaneous 5XD insulin    insulin lispro  12 Units Subcutaneous TID AC    insulin NPH  16 Units Subcutaneous Q12H [facility]    letermovir  480 mg Oral Daily    magnesium oxide-Mg AA chelate  1 tablet Oral BID    methocarbamol  500 mg Oral QID    methylPREDNISolone sodium succinate  60 mg Intravenous Q12H    Followed by    Melene Muller ON 04/08/2023] predniSONE  40 mg Oral BID    Followed by    Melene Muller ON 04/09/2023] predniSONE  20 mg Oral BID    Followed by    Melene Muller ON 04/10/2023] predniSONE  20 mg Oral Daily    mycophenolate  250 mg Oral BID    pantoprazole  40 mg Oral Daily    piperacillin-tazobactam (ZOSYN) IV (intermittent)  3.375 g Intravenous Q6H SCH    tacrolimus  5 mg Oral BID       Current Outpatient Medications   Medication Instructions    acetaminophen (TYLENOL) 325-650 mg, Oral, Every 8 hours PRN    acyclovir (ZOVIRAX) 400 mg, Oral, 2 times a day (standard)    aspirin (ECOTRIN) 81 mg, Oral, Daily (standard)    calcium carbonate (TUMS) 200 mg calcium (500 mg) chewable tablet 1 tablet, Oral, 3 times a day (standard)    docusate sodium (COLACE) 100 mg, Oral, 2 times a day PRN    enoxaparin (LOVENOX) 40 mg, Subcutaneous, Every 12 hours    gabapentin (NEURONTIN) 400 mg, Oral, 3 times a day (standard)    lancets Misc Use to check blood sugar as directed with insulin 3 times a day & for symptoms of high or low blood sugar.    letermovir (PREVYMIS) 480 mg, Oral, Daily (standard)    lidocaine 4 % patch Place 1 patch on skin around the incision for 12 hours nightly, then remove for 12 hours. Do not place on open skin or the incision directly.    magnesium oxide-Mg AA chelate (MAGNESIUM, AMINO ACID CHELATE,) 133 mg 1 tablet, Oral, 2 times a day (standard)    methocarbamol (ROBAXIN) 500 mg, Oral, 4 times a day    mycophenolate (CELLCEPT) 250 mg, Oral, 2 times a day (standard) pantoprazole (PROTONIX) 40 mg, Oral, Daily (standard)    pen needle, diabetic 32 gauge x 5/32 (4 mm) Ndle Use with insulin up to 4 times/day as needed.    pentamidine in sterile water 300 mg, Inhalation, Every 28 days    polyethylene glycol (MIRALAX) 17 gram packet Mix 1 packet in  4 to 8 ounces of liquid and drink daily as needed.    predniSONE (DELTASONE) 5 mg, Oral, Daily (standard)    SITagliptin phosphate (JANUVIA) 100 mg, Oral, Daily (standard)    tacrolimus (PROGRAF) 1 MG capsule  Take 4 capsules (4 mg total) by mouth daily AND 3 capsules (3 mg total) nightly.           Past Medical History:   Diagnosis Date    Cirrhosis (CMS-HCC)     History of transfusion        Past Surgical History:   Procedure Laterality Date    CESAREAN SECTION      PR COLSC FLX W/RMVL OF TUMOR POLYP LESION SNARE TQ N/A 09/11/2022    Procedure: COLONOSCOPY FLEX; W/REMOV TUMOR/LES BY SNARE;  Surgeon: Mickie Hillier, MD;  [facility];  Service: Gastroenterology    PR REMV VEIN CLOT CAVA-ILIAC,LEG INCIS N/A 02/14/2023    Procedure: THROMBEC DIRECT OR W/CATH; VENA CAVA BY LEG INCS;  Surgeon: Florene Glen, MD;  Location: MAIN OR [place];  Service: Transplant    PR TRANSPLANT LIVER,ALLOTRANSPLANT Bilateral 02/14/2023    Procedure: LIVER ALLOTRANSPLANTATION; ORTHOTOPIC, PARTIAL OR WHOLE, FROM CADAVER OR LIVING DONOR, ANY AGE;  Surgeon: Florene Glen, MD;  Location: MAIN OR [place];  Service: Transplant    PR TRANSPLANT,PREP DONOR LIVER, WHOLE N/A 02/14/2023    Procedure: [facility] STD PREP CAD DONOR WHOLE LIVER GFT PRIOR TNSPLNT,INC CHOLE,DISS/REM SURR TISSU WO TRISEG/LOBE SPLT;  Surgeon: Florene Glen, MD;  Location: MAIN OR [place];  Service: Transplant    PR UPPER GI ENDOSCOPY,DIAGNOSIS N/A 04/02/2017    Procedure: UGI ENDO, INCLUDE ESOPHAGUS, STOMACH, & DUODENUM &/OR JEJUNUM; DX W/WO COLLECTION SPECIMN, BY BRUSH OR WASH;  Surgeon: Janyth Pupa, MD;  [facility]: [facility];  Service: Gastroenterology    PR UPPER GI ENDOSCOPY,DIAGNOSIS N/A 09/11/2022    Procedure: UGI ENDO, INCLUDE ESOPHAGUS, STOMACH, & DUODENUM &/OR JEJUNUM; DX W/WO COLLECTION SPECIMN, BY BRUSH OR WASH;  Surgeon: Mickie Hillier, MD;  [facility];  Service: Gastroenterology       Family History   Problem Relation Age of Onset    No Known Problems Mother     Diabetes Father     No Known Problems Sister     No Known Problems Daughter     No Known Problems Maternal Grandmother     No Known Problems Maternal Grandfather     No Known Problems Paternal Grandmother     No Known Problems Paternal Grandfather     BRCA 1/2 Neg Hx     Breast cancer Neg Hx     Cancer Neg Hx     Colon cancer Neg Hx     Endometrial cancer Neg Hx     Ovarian cancer Neg Hx        Social History     Tobacco Use    Smoking status: Never    Smokeless tobacco: Never   Vaping Use    Vaping status: Never Used   Substance Use Topics    Alcohol use: No     Alcohol/week: 0.0 standard drinks of alcohol    Drug use: No       OBJECTIVE:  BP 136/79  - Pulse 67  - Temp 37 ??C (98.6 ??F) (Oral)  - Resp 16  - Ht 140.3 cm (4' 7.24)  - Wt 63.6 kg (140 lb 4.8 oz)  - SpO2 100%  - BMI 32.33 kg/m??   Wt Readings from Last 12 Encounters:   03/31/23 63.6 kg (140 lb 4.8 oz)   04/04/23 63.6 kg (140 lb 3.4 oz)   03/22/23 63.6 kg (140 lb 4.8 oz)   03/22/23 63.6 kg (140  lb 4.8 oz)   03/10/23 74.2 kg (163 lb 9.3 oz)   03/08/23 72.6 kg (160 lb)   03/01/23 77.2 kg (170 lb 1.6 oz)   02/21/23 75.6 kg (166 lb 10.7 oz)   02/06/23 77.9 kg (171 lb 11.2 oz)   02/02/23 86.3 kg (190 lb 3.2 oz)   01/15/23 76.2 kg (168 lb)   12/29/22 73.4 kg (161 lb 13.1 oz)     Physical Exam  Vitals and nursing note reviewed.   Constitutional:       General: She is not in acute distress.     Appearance: Normal appearance. She is ill-appearing (chronically).      Comments: Sitting up in chair   Eyes:      General: Scleral icterus present.   Pulmonary:      Effort: Pulmonary effort is normal. No respiratory distress.   Skin:     General: Skin is warm and dry.   Neurological:      General: No focal deficit present.      Mental Status: She is alert and oriented to person, place, and time.   Psychiatric:         Mood and Affect: Mood normal.         BG/insulin reviewed per EMR.   Glucose, POC (mg/dL)   Date Value   81/19/1478 226 (H)   04/07/2023 398 (H)   04/06/2023 261 (H)   04/06/2023 231 (H)   04/06/2023 145   04/06/2023 163   04/06/2023 272 (H)   04/05/2023 263 (H)        Summary of labs:  Lab Results   Component Value Date    A1C 4.6 (L) 02/14/2023    A1C 4.7 (L) 12/29/2022    A1C 5.0 11/11/2016     Lab Results   Component Value Date    CREATININE 0.64 04/07/2023     Lab Results   Component Value Date    WBC 3.3 (L) 04/07/2023    HGB 9.9 (L) 04/07/2023    HCT 30.2 (L) 04/07/2023    PLT 91 (L) 04/07/2023       Lab Results   Component Value Date    NA 137 04/07/2023    K 4.2 04/07/2023    CL 103 04/07/2023    CO2 27.0 04/07/2023    BUN 18 04/07/2023    CREATININE 0.64 04/07/2023    GLU 226 (H) 04/07/2023    CALCIUM 8.6 (L) 04/07/2023    MG 1.6 04/07/2023    PHOS 4.3 04/07/2023       Lab Results   Component Value Date    BILITOT 2.4 (H) 04/07/2023    BILIDIR 1.90 (H) 04/07/2023    PROT 6.0 04/07/2023    ALBUMIN 3.4 04/07/2023    ALT 96 (H) 04/07/2023    AST 21 04/07/2023    ALKPHOS 701 (H) 04/07/2023    GGT 394 (H) 03/31/2023

## 2023-04-08 DIAGNOSIS — Z796 Long-term use of immunosuppressant medication: Principal | ICD-10-CM

## 2023-04-08 DIAGNOSIS — Z944 Liver transplant status: Principal | ICD-10-CM

## 2023-04-08 DIAGNOSIS — T8641 Liver transplant rejection: Principal | ICD-10-CM

## 2023-04-08 MED ORDER — AMLODIPINE 5 MG TABLET
ORAL | 11 refills | 30 days | Status: CP
Start: 2023-04-08 — End: ?
  Filled 2023-04-07 – 2023-04-29 (×2): qty 30, 30d supply, fill #0

## 2023-04-08 MED ORDER — PREDNISONE 20 MG TABLET
ORAL_TABLET | ORAL | 0 refills | 2.00000 days | Status: CN
Start: 2023-04-08 — End: 2023-04-10

## 2023-04-08 NOTE — Unmapped (Signed)
8/15 - Spoke with pt and pt's daughter to discuss scheduling for Abd XR appt pt is okay with add on for 8/29 and per request from Nutrition provider to inform pt okay for appt on 9/24 to be in person with them. Pt verbalized understanding all discussed.

## 2023-04-08 NOTE — Unmapped (Signed)
Received on call page. Called back and spoke to pt and her daughter. Pt wanted to know what dose of Prednisone and how frequently she is supposed to take it. 8/14 pharmD note has Pred taper as follows:  Steroid Taper:   8/10-8/11: methylprednisolone 1000 mg q24  8/12: methylprednisolone 100 mg q12h  8/13: methylprednisolone 80 mg q12h  8/14: methylprednisolone 60 mg q12h  8/15: prednisone 40 mg BID  8/16: prednisone 20 mg BID  8/17 & thereafter: prednisone 20 mg daily  Pt does not need any Prednisone tonight and would start taking 40mg  BID tomorrow morning.  Relayed info to pt and routed note to primary coordinator to make sure that is accurate.

## 2023-04-08 NOTE — Unmapped (Addendum)
Patient's outpatient prednisone mistakenly discontinued at discharge. Patient paged on-call TNC for clarification last night, who referred to PharmD Anderson's note from yesterday, which included instruction to begin prednisone 40mg  bid today and 20mg  bid tomorrow and 20mg  daily thereafter. Reached out to PharmD Szempruch, who confirmed this and suggested patient refer to her medsheet, since the instructions were included there.    Spoke with patient and relayed recommendations. She reports she had a supply of 5mg  tabs at home and took 8 tabs this morning. She counted supply over the phone and confirmed she has enough for her doses through tomorrow tomorrow night. Reinforced that the dosage ordered was a 20mg  tab and she would only need to take one daily starting Saturday. She verbalized understanding. Sent the script to her local [facility] and confirmed receipt.

## 2023-04-09 NOTE — Unmapped (Signed)
Error

## 2023-04-10 LAB — MAGNESIUM: MAGNESIUM: 1.7 mg/dL (ref 1.6–2.3)

## 2023-04-10 LAB — COMPREHENSIVE METABOLIC PANEL
ALBUMIN: 4 g/dL (ref 3.9–4.9)
ALKALINE PHOSPHATASE: 481 IU/L — ABNORMAL HIGH (ref 44–121)
ALT (SGPT): 50 IU/L — ABNORMAL HIGH (ref 0–32)
AST (SGOT): 12 IU/L (ref 0–40)
BILIRUBIN TOTAL (MG/DL) IN SER/PLAS: 1.7 mg/dL — ABNORMAL HIGH (ref 0.0–1.2)
BLOOD UREA NITROGEN: 27 mg/dL — ABNORMAL HIGH (ref 6–24)
BUN / CREAT RATIO: 40 — ABNORMAL HIGH (ref 9–23)
CALCIUM: 9.5 mg/dL (ref 8.7–10.2)
CHLORIDE: 99 mmol/L (ref 96–106)
CO2: 24 mmol/L (ref 20–29)
CREATININE: 0.67 mg/dL (ref 0.57–1.00)
EGFR: 109 mL/min/{1.73_m2}
GLOBULIN, TOTAL: 1.9 g/dL (ref 1.5–4.5)
GLUCOSE: 266 mg/dL — ABNORMAL HIGH (ref 70–99)
POTASSIUM: 4.5 mmol/L (ref 3.5–5.2)
SODIUM: 137 mmol/L (ref 134–144)
TOTAL PROTEIN: 5.9 g/dL — ABNORMAL LOW (ref 6.0–8.5)

## 2023-04-10 LAB — GAMMA GT: GAMMA GLUTAMYL TRANSFERASE: 231 IU/L — ABNORMAL HIGH (ref 0–60)

## 2023-04-10 LAB — BILIRUBIN, DIRECT: BILIRUBIN DIRECT: 1.03 mg/dL — ABNORMAL HIGH (ref 0.00–0.40)

## 2023-04-10 LAB — PHOSPHORUS: PHOSPHORUS, SERUM: 4.6 mg/dL — ABNORMAL HIGH (ref 3.0–4.3)

## 2023-04-10 MED ORDER — PREDNISONE 20 MG TABLET: mg | ORAL | 2 refills | 30 days | Status: CP

## 2023-04-12 ENCOUNTER — Ambulatory Visit: Admit: 2023-04-12 | Discharge: 2023-04-12 | Payer: PRIVATE HEALTH INSURANCE

## 2023-04-12 ENCOUNTER — Other Ambulatory Visit: Admit: 2023-04-12 | Discharge: 2023-04-12 | Payer: PRIVATE HEALTH INSURANCE

## 2023-04-12 ENCOUNTER — Institutional Professional Consult (permissible substitution): Admit: 2023-04-12 | Discharge: 2023-04-12 | Payer: PRIVATE HEALTH INSURANCE

## 2023-04-12 DIAGNOSIS — Z944 Liver transplant status: Principal | ICD-10-CM

## 2023-04-12 LAB — CBC W/ DIFFERENTIAL
BANDED NEUTROPHILS ABSOLUTE COUNT: 0.1 10*3/uL (ref 0.0–0.1)
BASOPHILS ABSOLUTE COUNT: 0 10*3/uL (ref 0.0–0.2)
BASOPHILS RELATIVE PERCENT: 0 %
EOSINOPHILS ABSOLUTE COUNT: 0 10*3/uL (ref 0.0–0.4)
EOSINOPHILS RELATIVE PERCENT: 0 %
HEMATOCRIT: 30.5 % — ABNORMAL LOW (ref 34.0–46.6)
HEMOGLOBIN: 9.7 g/dL — ABNORMAL LOW (ref 11.1–15.9)
IMMATURE GRANULOCYTES: 2 %
LYMPHOCYTES ABSOLUTE COUNT: 0.2 10*3/uL — ABNORMAL LOW (ref 0.7–3.1)
LYMPHOCYTES RELATIVE PERCENT: 6 %
MEAN CORPUSCULAR HEMOGLOBIN CONC: 31.8 g/dL (ref 31.5–35.7)
MEAN CORPUSCULAR HEMOGLOBIN: 28.5 pg (ref 26.6–33.0)
MEAN CORPUSCULAR VOLUME: 90 fL (ref 79–97)
MONOCYTES ABSOLUTE COUNT: 0.2 10*3/uL (ref 0.1–0.9)
MONOCYTES RELATIVE PERCENT: 5 %
NEUTROPHILS ABSOLUTE COUNT: 3.4 10*3/uL (ref 1.4–7.0)
NEUTROPHILS RELATIVE PERCENT: 87 %
PLATELET COUNT: 83 10*3/uL — CL (ref 150–450)
RED BLOOD CELL COUNT: 3.4 x10E6/uL — ABNORMAL LOW (ref 3.77–5.28)
RED CELL DISTRIBUTION WIDTH: 16.1 % — ABNORMAL HIGH (ref 11.7–15.4)
WHITE BLOOD CELL COUNT: 3.8 10*3/uL (ref 3.4–10.8)

## 2023-04-12 LAB — CBC W/ AUTO DIFF
BASOPHILS ABSOLUTE COUNT: 0 10*9/L (ref 0.0–0.1)
BASOPHILS RELATIVE PERCENT: 0.1 %
EOSINOPHILS ABSOLUTE COUNT: 0.1 10*9/L (ref 0.0–0.5)
EOSINOPHILS RELATIVE PERCENT: 2.6 %
HEMATOCRIT: 33.6 % — ABNORMAL LOW (ref 34.0–44.0)
HEMOGLOBIN: 11.5 g/dL (ref 11.3–14.9)
LYMPHOCYTES ABSOLUTE COUNT: 0.6 10*9/L — ABNORMAL LOW (ref 1.1–3.6)
LYMPHOCYTES RELATIVE PERCENT: 14.1 %
MEAN CORPUSCULAR HEMOGLOBIN CONC: 34.2 g/dL (ref 32.0–36.0)
MEAN CORPUSCULAR HEMOGLOBIN: 30.2 pg (ref 25.9–32.4)
MEAN CORPUSCULAR VOLUME: 88.3 fL (ref 77.6–95.7)
MEAN PLATELET VOLUME: 8 fL (ref 6.8–10.7)
MONOCYTES ABSOLUTE COUNT: 0.4 10*9/L (ref 0.3–0.8)
MONOCYTES RELATIVE PERCENT: 7.9 %
NEUTROPHILS ABSOLUTE COUNT: 3.5 10*9/L (ref 1.8–7.8)
NEUTROPHILS RELATIVE PERCENT: 75.3 %
PLATELET COUNT: 86 10*9/L — ABNORMAL LOW (ref 150–450)
RED BLOOD CELL COUNT: 3.81 10*12/L — ABNORMAL LOW (ref 3.95–5.13)
RED CELL DISTRIBUTION WIDTH: 19.6 % — ABNORMAL HIGH (ref 12.2–15.2)
WBC ADJUSTED: 4.6 10*9/L (ref 3.6–11.2)

## 2023-04-12 LAB — GAMMA GT: GAMMA GLUTAMYL TRANSFERASE: 192 U/L — ABNORMAL HIGH

## 2023-04-12 LAB — PHOSPHORUS: PHOSPHORUS: 3.7 mg/dL (ref 2.4–5.1)

## 2023-04-12 LAB — MAGNESIUM: MAGNESIUM: 1.5 mg/dL — ABNORMAL LOW (ref 1.6–2.6)

## 2023-04-12 LAB — COMPREHENSIVE METABOLIC PANEL
ALBUMIN: 3.7 g/dL (ref 3.4–5.0)
ALKALINE PHOSPHATASE: 347 U/L — ABNORMAL HIGH (ref 46–116)
ALT (SGPT): 46 U/L (ref 10–49)
ANION GAP: 8 mmol/L (ref 5–14)
AST (SGOT): 19 U/L (ref <=34)
BILIRUBIN TOTAL: 1.7 mg/dL — ABNORMAL HIGH (ref 0.3–1.2)
BLOOD UREA NITROGEN: 20 mg/dL (ref 9–23)
BUN / CREAT RATIO: 28
CALCIUM: 9.2 mg/dL (ref 8.7–10.4)
CHLORIDE: 104 mmol/L (ref 98–107)
CO2: 29 mmol/L (ref 20.0–31.0)
CREATININE: 0.72 mg/dL
EGFR CKD-EPI (2021) FEMALE: >90 mL/min/{1.73_m2} (ref >=60)
GLUCOSE RANDOM: 163 mg/dL — ABNORMAL HIGH (ref 70–99)
POTASSIUM: 4.4 mmol/L (ref 3.4–4.8)
PROTEIN TOTAL: 6.6 g/dL (ref 5.7–8.2)
SODIUM: 141 mmol/L (ref 135–145)

## 2023-04-12 LAB — TACROLIMUS LEVEL
TACROLIMUS BLOOD: 9.5 ng/mL (ref 2.0–20.0)
TACROLIMUS BLOOD: 9.9 ng/mL

## 2023-04-12 LAB — BILIRUBIN, DIRECT: BILIRUBIN DIRECT: 1.2 mg/dL — ABNORMAL HIGH (ref 0.00–0.30)

## 2023-04-12 MED ORDER — PREDNISONE 5 MG TABLET
ORAL_TABLET | 5 refills | 0 days | Status: CP
Start: 2023-04-12 — End: ?

## 2023-04-12 MED ORDER — MYCOPHENOLATE MOFETIL 250 MG CAPSULE
ORAL_CAPSULE | Freq: Two times a day (BID) | ORAL | 11 refills | 30 days | Status: CP
Start: 2023-04-12 — End: ?
  Filled 2023-04-12: qty 120, 30d supply, fill #0

## 2023-04-12 NOTE — Unmapped (Unsigned)
[facility] CLINIC PHARMACY NOTE  Alicia Armstrong  [id number]    Medication changes today:   1.    Education/Adherence tools provided today:  - Provided updated medication list  - Provided additional education on immunosuppression and transplant related medications including reviewing indications of medications, dosing and side effects  - Discussed adherence reminder tools such as cell phone alarms    Follow up items:  1. Goal of understanding indications and dosing of immunosuppression medications  2. Pentamidine scheduling  3. LFTs    Next visit with pharmacy in 1-3 months  ____________________________________________________________________    Alicia Armstrong is a 47 y.o. 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     Immunologic Risk: first transplant    Other PMH significant for n/a    Post op course uncomplicated    Readmitted 7/6-7/9 with back pain and elevated bilirubin  Liver ultrasound showed concern for possible thrombosis or stenosis at common hepatic artery for which LMWH ppx 30 mg BID with plans for continuation for ~6 weeks.    Rejection History:  Admitted 8/6-8/14 for ACR treated with high dose steroids. Developed cholestasis s/p ERCP and stent placement for structure.  Infection History: NTD  ___________________________________________________________________    Last seen by pharmacy 2 weeks ago    Interval History:   7/15: Stop Humalog, hold MMF  7/15-7/17 admitted to [facility] from clinic with cytopenia, received dose of gCSF, 2 units PRBC, transitioned from valganciclovir to letermovir, enoxaparin increased to 40 mg BID  7/29: Restart mycophenolate 250 mg BID, start acyclovir 400 mg BID, stop calcium carbonate 500 mg TID  8/6-8/14 admission: start amlodipine 5 mg daily, Augmentin, carvedilol 6.25 mg BID, insulin glargine 16 units nightly, insulin lispro correction, oxycodone PRN    Seen by pharmacy today for: medication management, blood glucose management and education, and pill box fill and adherence education    CC:  Patient complains of  mild pain that comes and goes at incision site    General: No issues  Neuro:  occasional HA and feels tremulous inside  CV: No issues  Resp: No issues  GI: Constipation   GU: No issues  Derm: No issues  Psych: No issues.    Fluid Status:   Edema no SOB no  Intake: 80-100 oz    Plan: Continue to monitor      There were no vitals filed for this visit.    ___________________________________________________________________    Allergies   Allergen Reactions    Penicillins Other (See Comments)     Had a rash with penicillin when she was 47 years old. She tolerated piperacillin/tazobactam 08/25/2016-08/28/2016 with itching without rash. Tolerated Augmentin.       Medications reviewed in EPIC medication station and updated today by the clinical pharmacist practitioner.    Current Outpatient Medications   Medication Instructions    acetaminophen (TYLENOL) 325-650 mg, Oral, Every 8 hours PRN    acyclovir (ZOVIRAX) 400 mg, Oral, 2 times a day (standard)    amlodipine (NORVASC) 5 mg, Oral, Daily (standard)    amoxicillin-clavulanate (AUGMENTIN) 500-125 mg per tablet 1 tablet, Oral, 2 times a day (standard)    aspirin (ECOTRIN) 81 mg, Oral, Daily (standard)    blood sugar diagnostic (ACCU-CHEK GUIDE TEST STRIPS) Strp Other, 4 times a day    carvedilol (COREG) 6.25 mg, Oral, 2 times a day (standard)    docusate sodium (COLACE) 100 mg, Oral, 2 times a day PRN  gabapentin (NEURONTIN) 400 mg, Oral, 3 times a day (standard)    insulin glargine (BASAGLAR, LANTUS) 100 unit/mL (3 mL) injection pen Inject 25 units in the evening on 04/07/23 and 04/08/23, and then inject 16 units in the evening thereafter.    insulin lispro (HUMALOG) 9-15 Units, Subcutaneous, 3 times a day (AC), Inject 9 units three times a day with meals and inject 1 unit for every 25 mg/dL over 161 mg/dL three times a day with meals    lancets Misc Use to check blood sugar as directed with insulin 3 times a day & for symptoms of high or low blood sugar.    magnesium oxide-Mg AA chelate (MAGNESIUM, AMINO ACID CHELATE,) 133 mg 1 tablet, Oral, 2 times a day (standard)    methocarbamol (ROBAXIN) 500 mg, Oral, 4 times a day    mycophenolate (CELLCEPT) 250 mg, Oral, 2 times a day (standard)    oxyCODONE (ROXICODONE) 5 mg, Oral, Every 4 hours PRN    pantoprazole (PROTONIX) 40 mg, Oral, Daily (standard)    pen needle, diabetic 32 gauge x 5/32 (4 mm) Ndle Use with insulin up to 4 times/day as needed.    pentamidine in sterile water 300 mg, Inhalation, Every 28 days    polyethylene glycol (MIRALAX) 17 gram packet Mix 1 packet in  4 to 8 ounces of liquid and drink daily as needed.    predniSONE (DELTASONE) 20 mg, Oral, Daily (standard)    PREVYMIS 480 mg, Oral, Daily (standard)    SITagliptin phosphate (JANUVIA) 100 mg, Oral, Daily (standard)    tacrolimus (PROGRAF) 6 mg, Oral, 2 times a day         GRAFT FUNCTION: stable    Lab Results   Component Value Date    AST 12 04/09/2023    ALT 50 (H) 04/09/2023    Total Bilirubin 1.7 (H) 04/09/2023        Zero hour biopsy: negative for malignancy  Biopsies to date: NTD      Renal Function: stable    Lab Results   Component Value Date    Creatinine 0.67 04/09/2023    Creatinine 0.64 04/07/2023    Creatinine 0.57 04/06/2023    Creatinine 0.60 04/05/2023    Creatinine 0.52 (L) 04/04/2023    Creatinine 0.80 03/29/2023    Creatinine 0.84 03/25/2023    Creatinine 0.67 03/19/2023       Proteinuria/UPC:  none.  No results found for: PCRATIOUR        CURRENT IMMUNOSUPPRESSION:    Tacrolimus (Prograf) 6 mg BID  Tacrolimus Goal: 8 - 10   Mycophenolate mofetil (Cellcept) 250 mg BID    Prednisone 20 mg daily    IMMUNOSUPPRESSION DRUG LEVELS:  Lab Results   Component Value Date    Tacrolimus, Trough 16.7 (H) 03/10/2023    Tacrolimus, Trough 10.4 03/09/2023    Tacrolimus, Trough 10.7 02/23/2023    Tacrolimus Lvl 9.8 03/29/2023    Tacrolimus Lvl 10.1 03/25/2023    Tacrolimus Lvl 6.1 03/19/2023    Tacrolimus, Timed 6.5 04/07/2023    Tacrolimus, Timed 6.2 04/06/2023    Tacrolimus, Timed 6.6 04/05/2023     No results found for: CYCLO  No results found for: EVEROLIMUS  No results found for: SIROLIMUS    Prograf level is accurate 12 hour trough    Patient is tolerating immunosuppression well    WBC/ANC:  wnl  Lab Results   Component Value Date    WBC 3.3 (L) 04/07/2023  WBC 1.9 (LL) 03/29/2023       Plan:  Restart MMF 250 mg BID        OI Prophylaxis:   CMV Status: D-/ R+, moderate risk.   CMV prophylaxis:  letermovir 480 mg  x 3 months per protocol (end 05/18/23) - valcyte stopped for neturopenia  No results found for: CMVCP    HSV ppx: acyclovir 400 mg BID    PCP Prophylaxis: pentamidine 300 mg inhalation monthly x 6 months.    Thrush: completed in hospital    Patient is  tolerating infectious prophylaxis well    Plan: Continue per protocol.      HAT ppx:  Meds currently on: aspirin 81 mg daily, LMWH 40 mg q12h (goal anti-Xa 0.2-0.4). Completed  Plan: Continue to monitor    CAD prevention:  Statin therapy: Indicated; currently on no statin  Plan: Continue to monitor       BP: Goal < 140/90. Clinic vitals reported above  Home BP ranges: 100s-110/70s    Current meds include: amlodipine 5 mg daily, carvedilol 6.25 mg BID  Plan: within goal. Continue to monitor    Anemia of CKD:  H/H:   Lab Results   Component Value Date    HGB 9.9 (L) 04/07/2023     Lab Results   Component Value Date    HCT 30.2 (L) 04/07/2023     Iron panel:  Lab Results   Component Value Date    IRON 51 03/22/2023    TIBC 229 (L) 03/22/2023    FERRITIN 396.4 (H) 03/08/2023     Lab Results   Component Value Date    Iron Saturation (%) 22 03/22/2023       Prior ESA use: none post txp      Plan: stable. Continue to monitor.     DM:   Lab Results   Component Value Date    A1C 4.6 (L) 02/14/2023   . Goal A1c < 7  History of Dm?  No - post txp hyperglycemia  Established with endocrinologist/PCP for BG managment? No  Currently on: sitagliptin 100 mg daily, insulin glargine 16 units nightly, insulin lispro correction  Not regularly checking BG at home    Diet:3 meals per day + 2 protein shakes  Exercise:not yet  Hypoglycemia: no  Plan: No change.  Continue to monitor      Electrolytes: wnl  Lab Results   Component Value Date    Potassium 4.5 04/09/2023    Potassium, Bld 3.8 09/07/2022    Sodium 137 04/09/2023    Sodium Whole Blood 131 (L) 09/07/2022    Magnesium 1.7 04/09/2023    CO2 24 04/09/2023       Meds currently on: TUMS 1 tab TID, magnesium plus protein 133 mg BID  Plan: Stop TUMS 1 tab TID. Continue to monitor     GI/BM: pt reports constipation  Meds currently on: docusate PRN, Miralax PRN, pantoprazole 40 mg daily  Plan: No change.  Continue to monitor    Pain: pt reports mild pain at incision site that comes and goes  Meds currently on: APAP PRN, methocarbamol 500 mg QID, gabapentin 400 mg TID   Plan: No change.  Continue to monitor    Bone health:   Vitamin D Level: none available. Goal > 30.   No results found for: VITDTOT, VITDTOTAL    Lab Results   Component Value Date    Calcium 9.5 04/09/2023    Calcium 8.6 (L) 04/07/2023  Calcium 8.9 04/06/2023    Calcium 9.3 03/29/2023       Last DEXA results:   normal in 2018  Current meds include: none  Plan: Vitamin D level  needs to be drawn with next lab schedule. Continue to monitor.     Women's/Men's Health:  Alicia Armstrong is a 47 y.o. Female perimenopausal. Patient reports no men's/women's health issues  Plan: Continue to monitor    Immunizations:  Influenza [Annual]: Received 2023    PPSV23: Received 08/2016  PCV20: Received 08/2022    Shingrix Zoster [2 doses, 2 - 6 months apart]: 1st dose given 08/2022 and 2nd dose due 10/2022    COVID-19 [3 primary doses, 2 boosters]: 1st dose given 09/22/22    Immunization status: up to date and documented.    Pharmacy preference:  [facility]  Medication Refills:  Lidocaine patch, Miralax, mycophenolate sent to [facility]  Medication Access:  N/a    Adherence: Patient has average understanding of medications; was not able to independently identify names/doses of immunosuppressants and OI meds.  Patient  does fill their own pill box on a regular basis at home.  Patient brought medication card:yes  Pill box:was correct  Plan: provided moderate adherence counseling/intervention    Patient was reviewed with  Dr. Celine Mans  who was agreement with the stated plan:     During this visit, the following was completed:   BG log data assessment  BP log data assessment  Labs ordered and evaluated  complex treatment plan >1 DS   I spent a total of 30 minutes face to face with the patient delivering clinical care and providing education/counseling.    All questions/concerns were addressed to the patient's satisfaction.  __________________________________________    Beryle Lathe, PharmD  PGY2 Solid Organ Transplant Pharmacy Resident     Hazeline Junker, PharmD, CPP  Abdominal Transplant Clinical Pharmacist Practitioner

## 2023-04-12 NOTE — Unmapped (Signed)
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 female who underwent OLT on 02/15/23.    -Liver enzymes are essentially normal and her bilirubin continues to downtrend.  -Her Tac level is therapeutic  -Plan is to continue with the steroid taper, and increase her MMF to 500 mg twice daily.   -Otherwise from a clinical standpoint she continues to remain stable    History of Present Illness:   Alicia Armstrong is a 47 y.o. 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 Her immediate postop course was uncomplicated.  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  She received Lovenox for that which was subsequently discontinued on her last admission when her repeat Doppler did not show any parvus tardus waveform.  She underwent liver biopsy which showed mild cellular rejection for which she received steroids bolus followed by taper.  Postbiopsy she had persistent rise in bilirubin, underwent ERCP and stenting for moderate anastomotic stricture.       Physical Exam:  BP 138/80  - Pulse 84  - Temp 36.6 ??C (97.9 ??F) (Tympanic)  - Ht 140 cm (4' 7.12)  - Wt 62 kg (136 lb 9.6 oz)  - BMI 31.61 kg/m??   General Appearance:  No acute distress, well appearing and well nourished.   Head:  Normocephalic, atraumatic.   Eyes:  No scleral icterus.   Pulmonary:    Normal respiratory effort.   Cardiovascular:  Regular rate and rhythm.   Abdomen:   Abdominal scar well healed without hernia.   Neurologic: Non-focal exam.         Lab Results   Component Value Date    WBC 4.6 04/12/2023    HGB 11.5 04/12/2023    HCT 33.6 (L) 04/12/2023    PLT 86 (L) 04/12/2023     Lab Results   Component Value Date    NA 141 04/12/2023    K 4.4 04/12/2023    CL 104 04/12/2023    CO2 29.0 04/12/2023    BUN 20 04/12/2023    CREATININE 0.72 04/12/2023    CALCIUM 9.2 04/12/2023    MG 1.5 (L) 04/12/2023    PHOS 3.7 04/12/2023      Lab Results   Component Value Date    ALKPHOS 347 (H) 04/12/2023    BILITOT 1.7 (H) 04/12/2023    BILIDIR 1.20 (H) 04/12/2023    PROT 6.6 04/12/2023    ALBUMIN 3.7 04/12/2023    ALT 46 04/12/2023    AST 19 04/12/2023    GGT 192 (H) 04/12/2023      Lab Results   Component Value Date    INR 1.07 03/30/2023    APTT 31.8 03/30/2023          IMAGING: None      Dr Mathis Dad  Fellow Transplant Division  Department of Surgery  [facility]  Page - [phone]

## 2023-04-12 NOTE — Unmapped (Signed)
 In-person Interpreter has been requested.    Please see patient pharmacy visit for documentation.

## 2023-04-12 NOTE — Unmapped (Signed)
Patient seen in clinic today as f/up after her recent hospital discharge for biliary stricture and min.ACR. Interpreter, Nadara Eaton, present for assistance with Spanish.She reports she is feeling well, but says she has some occasional tenderness across her abdomen. She denies n/v/d/constipation/sob/fever/chills/dysuria or swelling. She has been recording her VS, which noted some elevated BPs on 8/16 at 187/94 in AM and 159/98 in PM, 8/17 at 138/86 at PM and on 8/18 at 151/95 in AM and 145/97 at PM. She reports taking her IS meds as schedule without missing. She is hydrating very well with 120 oz of water daily. Discuss importance of focusing on high protein foods and avoiding concentrated sweets d/t prednisone use and DM. Provided dietary list of common foods and their potassium content in [place] and [place] for future reference. Also provided stool testing materials with instruction to notify the txp team if she has watery diarrhea x 2 days. Patient seen by Dr. Celine Mans, who recommended next visit at 3 mos if she continues to do well and no need for urgent concerns in the interim. Explained that her other stent will be removed in about 6 mos from placement. Per PharmD visit today, patient's cellcept was increased today, which was concerning to patient, thinking it would harm her pancreas. Reassured her that damage to the pancreas is not a common occurrence amongst txp patients from use of cellcept. Let her know that on her return visit next week, she will have a kub to check for presence of pancreatic stent. Reviewed plan to reduce prednisone tomorrow to 15mg  and to continue on basaglar 25 units for another week until her prednisone is reduced again next Tues.to  10mg , when she will then reduce basaglar to 20 units daily. Spent about 15 min on post-transplant care andhealth maintenance education. She verbalized understanding of all discussed.

## 2023-04-13 DIAGNOSIS — Z944 Liver transplant status: Principal | ICD-10-CM

## 2023-04-13 DIAGNOSIS — T8641 Liver transplant rejection: Principal | ICD-10-CM

## 2023-04-13 MED ORDER — INSULIN LISPRO (U-100) 100 UNIT/ML SUBCUTANEOUS PEN
Freq: Three times a day (TID) | SUBCUTANEOUS | 11 refills | 84 days | Status: CP
Start: 2023-04-13 — End: ?

## 2023-04-13 MED ORDER — METHOCARBAMOL 500 MG TABLET
ORAL_TABLET | Freq: Four times a day (QID) | ORAL | 1 refills | 30 days | Status: CP | PRN
Start: 2023-04-13 — End: ?

## 2023-04-13 MED ORDER — PREDNISONE 5 MG TABLET
ORAL_TABLET | 5 refills | 0 days | Status: CP
Start: 2023-04-13 — End: ?

## 2023-04-13 NOTE — Unmapped (Signed)
Patient contacted [facility] and said that she did not pick up the 5mg  prednisone tabs from the pharmacy yesterday with her other prescriptions. She took another 20mg  tab today and called [facility] for the prescription, but it will not arrive until Thursday, making it Friday, before she begins the 15mg  dose prescribed yesterday. Let her know the rx would be sent to her local pharmacy for pickup today, so she can start the new dose tomorrow. Reviewed dosing instructions for tapering to 10mg  prednisone in 7 days and insulin recommendations for insulin glargine to remain at 25 units until she reduces her prednisone dose to 10 mg, at which time, she will reduce her insulin glargine to 20 units nightly. She verbalized understanding of all discussed and said she already reduced her insulin lispro to 6 units 3x daily plus SS prn. Prednisone sent to her local [facility] as requested.

## 2023-04-15 DIAGNOSIS — Z944 Liver transplant status: Principal | ICD-10-CM

## 2023-04-15 LAB — CBC W/ DIFFERENTIAL
BANDED NEUTROPHILS ABSOLUTE COUNT: 0 10*3/uL (ref 0.0–0.1)
BASOPHILS ABSOLUTE COUNT: 0 10*3/uL (ref 0.0–0.2)
BASOPHILS RELATIVE PERCENT: 0 %
EOSINOPHILS ABSOLUTE COUNT: 0.1 10*3/uL (ref 0.0–0.4)
EOSINOPHILS RELATIVE PERCENT: 2 %
HEMATOCRIT: 34.5 % (ref 34.0–46.6)
HEMOGLOBIN: 11.2 g/dL (ref 11.1–15.9)
IMMATURE GRANULOCYTES: 1 %
LYMPHOCYTES ABSOLUTE COUNT: 0.7 10*3/uL (ref 0.7–3.1)
LYMPHOCYTES RELATIVE PERCENT: 16 %
MEAN CORPUSCULAR HEMOGLOBIN CONC: 32.5 g/dL (ref 31.5–35.7)
MEAN CORPUSCULAR HEMOGLOBIN: 28.9 pg (ref 26.6–33.0)
MEAN CORPUSCULAR VOLUME: 89 fL (ref 79–97)
MONOCYTES ABSOLUTE COUNT: 0.2 10*3/uL (ref 0.1–0.9)
MONOCYTES RELATIVE PERCENT: 6 %
NEUTROPHILS ABSOLUTE COUNT: 3.3 10*3/uL (ref 1.4–7.0)
NEUTROPHILS RELATIVE PERCENT: 75 %
PLATELET COUNT: 93 10*3/uL — CL (ref 150–450)
RED BLOOD CELL COUNT: 3.88 x10E6/uL (ref 3.77–5.28)
RED CELL DISTRIBUTION WIDTH: 17.3 % — ABNORMAL HIGH (ref 11.7–15.4)
WHITE BLOOD CELL COUNT: 4.3 10*3/uL (ref 3.4–10.8)

## 2023-04-15 LAB — GAMMA GT: GAMMA GLUTAMYL TRANSFERASE: 126 IU/L — ABNORMAL HIGH (ref 0–60)

## 2023-04-15 LAB — COMPREHENSIVE METABOLIC PANEL
ALBUMIN: 4 g/dL (ref 3.9–4.9)
ALKALINE PHOSPHATASE: 312 IU/L — ABNORMAL HIGH (ref 44–121)
ALT (SGPT): 33 IU/L — ABNORMAL HIGH (ref 0–32)
AST (SGOT): 13 IU/L (ref 0–40)
BILIRUBIN TOTAL (MG/DL) IN SER/PLAS: 1.2 mg/dL (ref 0.0–1.2)
BLOOD UREA NITROGEN: 21 mg/dL (ref 6–24)
BUN / CREAT RATIO: 26 — ABNORMAL HIGH (ref 9–23)
CALCIUM: 9.3 mg/dL (ref 8.7–10.2)
CHLORIDE: 102 mmol/L (ref 96–106)
CO2: 25 mmol/L (ref 20–29)
CREATININE: 0.82 mg/dL (ref 0.57–1.00)
EGFR: 89 mL/min/{1.73_m2}
GLOBULIN, TOTAL: 2.2 g/dL (ref 1.5–4.5)
GLUCOSE: 141 mg/dL — ABNORMAL HIGH (ref 70–99)
POTASSIUM: 4.7 mmol/L (ref 3.5–5.2)
SODIUM: 142 mmol/L (ref 134–144)
TOTAL PROTEIN: 6.2 g/dL (ref 6.0–8.5)

## 2023-04-15 LAB — PHOSPHORUS: PHOSPHORUS, SERUM: 3.7 mg/dL (ref 3.0–4.3)

## 2023-04-15 LAB — BILIRUBIN, DIRECT: BILIRUBIN DIRECT: 0.72 mg/dL — ABNORMAL HIGH (ref 0.00–0.40)

## 2023-04-15 LAB — MAGNESIUM: MAGNESIUM: 1.9 mg/dL (ref 1.6–2.3)

## 2023-04-17 DIAGNOSIS — Z796 Long-term use of immunosuppressant medication: Principal | ICD-10-CM

## 2023-04-17 DIAGNOSIS — Z944 Liver transplant status: Principal | ICD-10-CM

## 2023-04-17 LAB — COMPREHENSIVE METABOLIC PANEL
ALBUMIN: 3.9 g/dL (ref 3.9–4.9)
ALKALINE PHOSPHATASE: 255 IU/L — ABNORMAL HIGH (ref 44–121)
ALT (SGPT): 27 IU/L (ref 0–32)
AST (SGOT): 15 IU/L (ref 0–40)
BILIRUBIN TOTAL (MG/DL) IN SER/PLAS: 1.1 mg/dL (ref 0.0–1.2)
BLOOD UREA NITROGEN: 18 mg/dL (ref 6–24)
BUN / CREAT RATIO: 22 (ref 9–23)
CALCIUM: 9.1 mg/dL (ref 8.7–10.2)
CHLORIDE: 104 mmol/L (ref 96–106)
CO2: 25 mmol/L (ref 20–29)
CREATININE: 0.82 mg/dL (ref 0.57–1.00)
EGFR: 89 mL/min/{1.73_m2}
GLOBULIN, TOTAL: 1.9 g/dL (ref 1.5–4.5)
GLUCOSE: 88 mg/dL (ref 70–99)
POTASSIUM: 5.2 mmol/L (ref 3.5–5.2)
SODIUM: 143 mmol/L (ref 134–144)
TOTAL PROTEIN: 5.8 g/dL — ABNORMAL LOW (ref 6.0–8.5)

## 2023-04-17 LAB — GAMMA GT: GAMMA GLUTAMYL TRANSFERASE: 95 IU/L — ABNORMAL HIGH (ref 0–60)

## 2023-04-17 LAB — PHOSPHORUS: PHOSPHORUS, SERUM: 4.6 mg/dL — ABNORMAL HIGH (ref 3.0–4.3)

## 2023-04-17 LAB — BILIRUBIN, DIRECT: BILIRUBIN DIRECT: 0.6 mg/dL — ABNORMAL HIGH (ref 0.00–0.40)

## 2023-04-17 LAB — TACROLIMUS LEVEL: TACROLIMUS BLOOD: 17 ng/mL (ref 2.0–20.0)

## 2023-04-17 LAB — MAGNESIUM: MAGNESIUM: 1.9 mg/dL (ref 1.6–2.3)

## 2023-04-17 NOTE — Unmapped (Signed)
Noted that patient's 8/21 Tac level resulted this morning at 17.0. Spoke with patient, who confirmed she had been taking the prescribed dosage of Tacrolimus and that the level was reliable, assuring [facility] that she took the medication after her labs were drawn. Discussed IS with Dr.Chen, who recommended that the patient take her cellcept and prednisone as prescribed, but skip her dose of Tacrolimus tonight and begin Tac dose tomorrow morning of 5mg  bid. Reached out to [employer], [place], who contacted patient. With his assistance, explained to patient that fruit drinks/cocktails and certain sodas contain grapefruit and should be avoided, as well as clementine fruits and pomegranate. Patient denied consuming any of these lately. She verbalized understanding to adjust dose as recommended.

## 2023-04-18 LAB — TACROLIMUS LEVEL: TACROLIMUS BLOOD: 18.2 ng/mL (ref 2.0–20.0)

## 2023-04-18 MED ORDER — TACROLIMUS 1 MG CAPSULE, IMMEDIATE-RELEASE
ORAL_CAPSULE | Freq: Two times a day (BID) | ORAL | 11 refills | 30 days | Status: CP
Start: 2023-04-18 — End: 2024-04-17

## 2023-04-19 DIAGNOSIS — Z944 Liver transplant status: Principal | ICD-10-CM

## 2023-04-19 LAB — CBC W/ DIFFERENTIAL
BANDED NEUTROPHILS ABSOLUTE COUNT: 0 10*3/uL (ref 0.0–0.1)
BASOPHILS ABSOLUTE COUNT: 0 10*3/uL (ref 0.0–0.2)
BASOPHILS RELATIVE PERCENT: 0 %
EOSINOPHILS ABSOLUTE COUNT: 0.1 10*3/uL (ref 0.0–0.4)
EOSINOPHILS RELATIVE PERCENT: 2 %
HEMATOCRIT: 35 % (ref 34.0–46.6)
HEMOGLOBIN: 11.1 g/dL (ref 11.1–15.9)
IMMATURE GRANULOCYTES: 0 %
LYMPHOCYTES ABSOLUTE COUNT: 0.7 10*3/uL (ref 0.7–3.1)
LYMPHOCYTES RELATIVE PERCENT: 14 %
MEAN CORPUSCULAR HEMOGLOBIN CONC: 31.7 g/dL (ref 31.5–35.7)
MEAN CORPUSCULAR HEMOGLOBIN: 29.3 pg (ref 26.6–33.0)
MEAN CORPUSCULAR VOLUME: 92 fL (ref 79–97)
MONOCYTES ABSOLUTE COUNT: 0.3 10*3/uL (ref 0.1–0.9)
MONOCYTES RELATIVE PERCENT: 6 %
NEUTROPHILS ABSOLUTE COUNT: 4.1 10*3/uL (ref 1.4–7.0)
NEUTROPHILS RELATIVE PERCENT: 78 %
PLATELET COUNT: 93 10*3/uL — CL (ref 150–450)
RED BLOOD CELL COUNT: 3.79 x10E6/uL (ref 3.77–5.28)
RED CELL DISTRIBUTION WIDTH: 17 % — ABNORMAL HIGH (ref 11.7–15.4)
WHITE BLOOD CELL COUNT: 5.2 10*3/uL (ref 3.4–10.8)

## 2023-04-19 NOTE — Unmapped (Addendum)
[facility] Pharmacist has reviewed a new prescription for tacrolimus that indicates a dose decrease.  Patient was counseled on this dosage change by [facility]- see epic note from 04/17/23.  Next refill call date adjusted if necessary.          Clinical Assessment Needed For: Dose Change  Medication: Tacrolimus 1mg  capsule  Last Fill Date/Day Supply: 04/07/2023 / 30 days  Refill Too Soon until 04/29/2023  Was previous dose already scheduled to fill: No    Notes to Pharmacist: Will re-test on 09/05

## 2023-04-20 DIAGNOSIS — Z944 Liver transplant status: Principal | ICD-10-CM

## 2023-04-20 LAB — COMPREHENSIVE METABOLIC PANEL
ALBUMIN: 4 g/dL (ref 3.9–4.9)
ALKALINE PHOSPHATASE: 231 IU/L — ABNORMAL HIGH (ref 44–121)
ALT (SGPT): 19 IU/L (ref 0–32)
AST (SGOT): 10 IU/L (ref 0–40)
BILIRUBIN TOTAL (MG/DL) IN SER/PLAS: 1 mg/dL (ref 0.0–1.2)
BLOOD UREA NITROGEN: 16 mg/dL (ref 6–24)
BUN / CREAT RATIO: 19 (ref 9–23)
CALCIUM: 9.1 mg/dL (ref 8.7–10.2)
CHLORIDE: 102 mmol/L (ref 96–106)
CO2: 22 mmol/L (ref 20–29)
CREATININE: 0.84 mg/dL (ref 0.57–1.00)
EGFR: 87 mL/min/{1.73_m2}
GLOBULIN, TOTAL: 1.9 g/dL (ref 1.5–4.5)
GLUCOSE: 96 mg/dL (ref 70–99)
POTASSIUM: 4.2 mmol/L (ref 3.5–5.2)
SODIUM: 141 mmol/L (ref 134–144)
TOTAL PROTEIN: 5.9 g/dL — ABNORMAL LOW (ref 6.0–8.5)

## 2023-04-20 LAB — MAGNESIUM: MAGNESIUM: 1.9 mg/dL (ref 1.6–2.3)

## 2023-04-20 LAB — CBC W/ DIFFERENTIAL
BASOPHILS ABSOLUTE COUNT: 0 10*3/uL (ref 0.0–0.2)
BASOPHILS RELATIVE PERCENT: 0 %
EOSINOPHILS ABSOLUTE COUNT: 0 10*3/uL (ref 0.0–0.4)
EOSINOPHILS RELATIVE PERCENT: 0 %
HEMATOCRIT: 37 % (ref 34.0–46.6)
HEMOGLOBIN: 12 g/dL (ref 11.1–15.9)
LYMPHOCYTES ABSOLUTE COUNT: 0.6 10*3/uL — ABNORMAL LOW (ref 0.7–3.1)
LYMPHOCYTES RELATIVE PERCENT: 14 %
MEAN CORPUSCULAR HEMOGLOBIN CONC: 32.4 g/dL (ref 31.5–35.7)
MEAN CORPUSCULAR HEMOGLOBIN: 29.7 pg (ref 26.6–33.0)
MEAN CORPUSCULAR VOLUME: 92 fL (ref 79–97)
MONOCYTES ABSOLUTE COUNT: 0.1 10*3/uL (ref 0.1–0.9)
MONOCYTES RELATIVE PERCENT: 2 %
NEUTROPHILS ABSOLUTE COUNT: 3.6 10*3/uL (ref 1.4–7.0)
NEUTROPHILS RELATIVE PERCENT: 84 %
PLATELET COUNT: 82 10*3/uL — CL (ref 150–450)
RED BLOOD CELL COUNT: 4.04 x10E6/uL (ref 3.77–5.28)
RED CELL DISTRIBUTION WIDTH: 17 % — ABNORMAL HIGH (ref 11.7–15.4)
WHITE BLOOD CELL COUNT: 4.3 10*3/uL (ref 3.4–10.8)

## 2023-04-20 LAB — BILIRUBIN, DIRECT: BILIRUBIN DIRECT: 0.53 mg/dL — ABNORMAL HIGH (ref 0.00–0.40)

## 2023-04-20 LAB — GAMMA GT: GAMMA GLUTAMYL TRANSFERASE: 80 IU/L — ABNORMAL HIGH (ref 0–60)

## 2023-04-20 LAB — PHOSPHORUS: PHOSPHORUS, SERUM: 4.4 mg/dL — ABNORMAL HIGH (ref 3.0–4.3)

## 2023-04-21 LAB — TACROLIMUS LEVEL: TACROLIMUS BLOOD: 15.7 ng/mL (ref 2.0–20.0)

## 2023-04-21 NOTE — Unmapped (Unsigned)
[facility] CLINIC PHARMACY NOTE  Alicia Armstrong  [id number]    Medication changes today:   Decrease tacrolimus to 4 mg BID  Increase Humalog to 8 units TID AC  Pentamidine given in clinic today    Education/Adherence tools provided today:  - Provided updated medication list  - Provided additional education on immunosuppression and transplant related medications including reviewing indications of medications, dosing and side effects  - Discussed adherence reminder tools such as cell phone alarms    Follow up items:  1. Goal of understanding indications and dosing of immunosuppression medications  2. LFTs  3. Consider at the 3 month visit transition back to Bactrim  4. Update lipid panel, consider statin in the future    Next visit with pharmacy in 1-3 months  ____________________________________________________________________    Alicia Armstrong is a 47 y.o. 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     Immunologic Risk: first transplant    Other PMH significant for n/a    Induction: Methylpred    Post op course uncomplicated    Readmitted 7/6-7/9 with back pain and elevated bilirubin  Liver ultrasound showed concern for possible thrombosis or stenosis at common hepatic artery for which LMWH ppx 30 mg BID with plans for continuation for ~6 weeks.    Rejection History:  Admitted 8/6-8/14 for ACR treated with high dose steroids. 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  Infection History: NTD  ___________________________________________________________________    Last seen by pharmacy 2 weeks ago    Interval History:   7/15: Stop Humalog, hold MMF  7/15-7/17 admitted to [facility] from clinic with cytopenia, received dose of gCSF, 2 units PRBC, transitioned from valganciclovir to letermovir, enoxaparin increased to 40 mg BID  7/29: Restart mycophenolate 250 mg BID, start acyclovir 400 mg BID, stop calcium carbonate 500 mg TID  8/6-8/14 admission: start amlodipine 5 mg daily, Augmentin, carvedilol 6.25 mg BID, insulin glargine 16 units nightly, insulin lispro correction, oxycodone PRN  8/19: Increase MMF to 500 mg BID, decrease prednisone to 15 mg daily x7d then 10 mg daily indefinitely, decrease Humalog to 6 units TID AC, when prednisone dose reaches 10 mg will reduce Lantus to 20 units daily    Seen by pharmacy today for: medication management, blood glucose management and education, and pill box fill and adherence education    CC:  Patient complains of  sharpness that comes and goes at incision site    General: No issues  Neuro: Tremors  CV: No issues  Resp: No issues  GI: Constipation   GU: No issues  Derm: No issues  Psych: No issues.    Fluid Status:   Edema no SOB no  Intake: 80-100 oz    Plan: Continue to monitor      Vitals:    04/22/23 1328   BP: 131/91   Pulse: 79   Temp: 36.3 ??C (97.4 ??F)       ___________________________________________________________________    Allergies   Allergen Reactions    Penicillins Other (See Comments)     Had a rash with penicillin when she was 47 years old. She tolerated piperacillin/tazobactam 08/25/2016-08/28/2016 with itching without rash. Tolerated Augmentin.       Medications reviewed in EPIC medication station and updated today by the clinical pharmacist practitioner.    Current Outpatient Medications   Medication Instructions    acetaminophen (TYLENOL) 325-650 mg, Oral, Every 8 hours PRN    acyclovir (  ZOVIRAX) 400 mg, Oral, 2 times a day (standard)    amlodipine (NORVASC) 5 mg, Oral, Daily (standard)    aspirin (ECOTRIN) 81 mg, Oral, Daily (standard)    blood sugar diagnostic (ACCU-CHEK GUIDE TEST STRIPS) Strp Other, 4 times a day    carvedilol (COREG) 6.25 mg, Oral, 2 times a day (standard)    docusate sodium (COLACE) 100 mg, Oral, 2 times a day PRN    gabapentin (NEURONTIN) 400 mg, Oral, 3 times a day (standard)    insulin glargine (BASAGLAR, LANTUS) 100 unit/mL (3 mL) injection pen Inject 25 units in the evening on 04/07/23 and 04/08/23, and then inject 16 units in the evening thereafter.    insulin lispro (HUMALOG) 6 Units, Subcutaneous, 3 times a day [facility]), Take in addition to sliding scale. Max 50 units per day    lancets Misc Use to check blood sugar as directed with insulin 3 times a day & for symptoms of high or low blood sugar.    magnesium oxide-Mg AA chelate (MAGNESIUM, AMINO ACID CHELATE,) 133 mg 1 tablet, Oral, 2 times a day (standard)    methocarbamol (ROBAXIN) 500 mg, Oral, 4 times a day PRN    mycophenolate (CELLCEPT) 500 mg, Oral, 2 times a day (standard)    pantoprazole (PROTONIX) 40 mg, Oral, Daily (standard)    pen needle, diabetic 32 gauge x 5/32 (4 mm) Ndle Use with insulin up to 4 times/day as needed.    polyethylene glycol (MIRALAX) 17 gram packet Mix 1 packet in  4 to 8 ounces of liquid and drink daily as needed.    predniSONE (DELTASONE) 5 MG tablet Take 3 tablets (15 mg) by mouth for 7 days then reduce to 2 tablets (10 mg) once daily    PREVYMIS 480 mg, Oral, Daily (standard)    SITagliptin phosphate (JANUVIA) 100 mg, Oral, Daily (standard)    tacrolimus (PROGRAF) 5 mg, Oral, 2 times a day         GRAFT FUNCTION: stable    Lab Results   Component Value Date    AST 8 04/21/2023    ALT 15 04/21/2023    Total Bilirubin 0.8 04/21/2023        Zero hour biopsy: negative for malignancy  Biopsies to date:  8/9: Minimal to mild portal inflammation, mild bile duct injury and focal endotheliitis, compatible with minimal acute cellular rejection, Banff score=3 (1+1+1)      Renal Function: stable    Lab Results   Component Value Date    Creatinine 0.84 04/21/2023    Creatinine 0.84 04/19/2023    Creatinine 0.82 04/16/2023    Creatinine 0.82 04/14/2023       Proteinuria/UPC:  none.  No results found for: PCRATIOUR        CURRENT IMMUNOSUPPRESSION:    Tacrolimus (Prograf) 5 mg BID  Tacrolimus Goal: 8 - 10   Mycophenolate mofetil (Cellcept) 500 mg BID    Prednisone 10 mg daily    IMMUNOSUPPRESSION DRUG LEVELS:  Lab Results   Component Value Date    Tacrolimus, Trough 16.7 (H) 03/10/2023    Tacrolimus, Trough 10.4 03/09/2023    Tacrolimus, Trough 10.7 02/23/2023    Tacrolimus Lvl 15.7 04/19/2023    Tacrolimus Lvl 18.2 04/16/2023    Tacrolimus Lvl 17.0 04/14/2023     No results found for: CYCLO  No results found for: EVEROLIMUS  No results found for: SIROLIMUS    Not drawn today    Patient complains of tremor, likely due  to supratherapeutic tac levels    WBC/ANC:  wnl  Lab Results   Component Value Date    WBC 3.8 04/21/2023       Plan: Will decrease tacrolimus to 4 mg BID based on previous supratherapeutic level of 15.7 on 8/26. Plan to decrease prednisone to 5 mg daily in one week if LFTs are stable and tacrolimus levels remain therapeutic.      OI Prophylaxis:   CMV Status: D-/ R+, moderate risk.   CMV prophylaxis:  letermovir 480 mg  x 3 months per protocol (end 05/18/23) - valcyte stopped for neturopenia  No results found for: CMVCP    HSV ppx: acyclovir 400 mg BID (end 05/18/23)     PCP Prophylaxis: pentamidine 300 mg inhalation monthly x 6 months.    Thrush: completed in hospital    Patient is  tolerating infectious prophylaxis well    Plan: Continue per protocol.      HAT ppx:  Meds currently on: aspirin 81 mg daily, LMWH 40 mg q12h (goal anti-Xa 0.2-0.4). Completed  Plan: Continue to monitor    CAD prevention:  The ASCVD Risk score (Arnett DK, et al., 2019) failed to calculate for the following reasons:    Cannot find a previous HDL lab    Cannot find a previous total cholesterol lab    Note: For patients with SBP <90 or >200, Total Cholesterol <130 or >320, HDL <20 or >100 which are outside of the allowable range, the calculator will use these upper or lower values to calculate the patient???s risk score.     Statin therapy: Indicated; currently on no statin  Plan: Repeat lipid panel. Consider statin in the future. Continue to monitor       BP: Goal < 140/90. Clinic vitals reported above  Home BP ranges: 120-130s/70-80s    Current meds include: amlodipine 5 mg daily, carvedilol 6.25 mg BID  Plan: within goal. Continue to monitor    Anemia of CKD:  H/H:   Lab Results   Component Value Date    HGB 11.7 04/21/2023     Lab Results   Component Value Date    HCT 35.7 04/21/2023     Iron panel:  Lab Results   Component Value Date    IRON 51 03/22/2023    TIBC 229 (L) 03/22/2023    FERRITIN 396.4 (H) 03/08/2023     Lab Results   Component Value Date    Iron Saturation (%) 22 03/22/2023       Prior ESA use: none post txp      Plan: stable. Continue to monitor.     DM:   Lab Results   Component Value Date    A1C 4.6 (L) 02/14/2023   . Goal A1c < 7  History of Dm?  No - post txp hyperglycemia  Established with endocrinologist/PCP for BG managment? No  Currently on: sitagliptin 100 mg daily, insulin glargine 25 units nightly, insulin lispro 6 units TID AC + correction  FBG: 110s-170s  PPBG: 150s-300s    Diet:3 meals per day + 2 protein shakes  Exercise:not yet  Hypoglycemia: no  Plan: Increase Humalog to 8 units TID AC. Continue to monitor      Electrolytes: wnl  Lab Results   Component Value Date    Potassium 4.4 04/21/2023    Potassium, Bld 3.8 09/07/2022    Sodium 142 04/21/2023    Sodium Whole Blood 131 (L) 09/07/2022    Magnesium 1.6 04/21/2023  CO2 24 04/21/2023       Meds currently on: magnesium plus protein 133 mg BID  Plan: No change.  Continue to monitor     GI/BM: pt reports constipation  Meds currently on: docusate PRN, Miralax PRN, pantoprazole 40 mg daily  Plan: No change.  Continue to monitor    Pain: pt reports mild pain at incision site that comes and goes  Meds currently on: APAP PRN, methocarbamol 500 mg QID, gabapentin 400 mg BID (Will take every other day)  Plan: No change.  Encouraged taking gabapentin daily to help with stinging around incision site. Continue to monitor    Bone health:   Vitamin D Level: none available. Goal > 30.   No results found for: VITDTOT, VITDTOTAL    Lab Results   Component Value Date    Calcium 8.9 04/21/2023    Calcium 9.1 04/19/2023       Last DEXA results:   normal in 2018  Current meds include: none  Plan: Vitamin D level  needs to be drawn with next lab schedule. Continue to monitor.     Women's/Men's Health:  Alicia Armstrong is a 47 y.o. Female perimenopausal. Patient reports no men's/women's health issues  Plan: Continue to monitor    Immunizations:  Influenza [Annual]: Received 2023    PPSV23: Received 08/2016  PCV20: Received 08/2022    Shingrix Zoster [2 doses, 2 - 6 months apart]: 1st dose given 08/2022 and 2nd dose due 10/2022    COVID-19 [3 primary doses, 2 boosters]: 1st dose given 09/22/22    Immunization status: up to date and documented.    Pharmacy preference:  [facility]  Medication Refills:  Acyclovir, pantoprazole, Miralax  Medication Access:  N/a    Adherence: Patient has average understanding of medications; was not able to independently identify names/doses of immunosuppressants and OI meds.  Patient  does fill their own pill box on a regular basis at home.  Patient brought medication card:yes  Pill box:did not bring  Plan: provided moderate adherence counseling/intervention    Patient was reviewed with  Dr. Celine Mans  who was agreement with the stated plan:     During this visit, the following was completed:   BG log data assessment  BP log data assessment  Labs ordered and evaluated  complex treatment plan >1 DS   I spent a total of 15 minutes face to face with the patient delivering clinical care and providing education/counseling.    All questions/concerns were addressed to the patient's satisfaction.  __________________________________________    Beryle Lathe, PharmD  PGY2 Solid Organ Transplant Pharmacy Resident

## 2023-04-22 ENCOUNTER — Ambulatory Visit: Admit: 2023-04-22 | Discharge: 2023-04-23 | Payer: PRIVATE HEALTH INSURANCE

## 2023-04-22 ENCOUNTER — Institutional Professional Consult (permissible substitution): Admit: 2023-04-22 | Discharge: 2023-04-23 | Payer: PRIVATE HEALTH INSURANCE

## 2023-04-22 DIAGNOSIS — Z944 Liver transplant status: Principal | ICD-10-CM

## 2023-04-22 LAB — GAMMA GT: GAMMA GLUTAMYL TRANSFERASE: 63 IU/L — ABNORMAL HIGH (ref 0–60)

## 2023-04-22 LAB — CBC W/ DIFFERENTIAL
BANDED NEUTROPHILS ABSOLUTE COUNT: 0 10*3/uL (ref 0.0–0.1)
BASOPHILS ABSOLUTE COUNT: 0 10*3/uL (ref 0.0–0.2)
BASOPHILS RELATIVE PERCENT: 0 %
EOSINOPHILS ABSOLUTE COUNT: 0.1 10*3/uL (ref 0.0–0.4)
EOSINOPHILS RELATIVE PERCENT: 3 %
HEMATOCRIT: 35.7 % (ref 34.0–46.6)
HEMOGLOBIN: 11.7 g/dL (ref 11.1–15.9)
IMMATURE GRANULOCYTES: 0 %
LYMPHOCYTES ABSOLUTE COUNT: 0.6 10*3/uL — ABNORMAL LOW (ref 0.7–3.1)
LYMPHOCYTES RELATIVE PERCENT: 17 %
MEAN CORPUSCULAR HEMOGLOBIN CONC: 32.8 g/dL (ref 31.5–35.7)
MEAN CORPUSCULAR HEMOGLOBIN: 30.2 pg (ref 26.6–33.0)
MEAN CORPUSCULAR VOLUME: 92 fL (ref 79–97)
MONOCYTES ABSOLUTE COUNT: 0.2 10*3/uL (ref 0.1–0.9)
MONOCYTES RELATIVE PERCENT: 6 %
NEUTROPHILS ABSOLUTE COUNT: 2.8 10*3/uL (ref 1.4–7.0)
NEUTROPHILS RELATIVE PERCENT: 74 %
PLATELET COUNT: 84 10*3/uL — CL (ref 150–450)
RED BLOOD CELL COUNT: 3.87 x10E6/uL (ref 3.77–5.28)
RED CELL DISTRIBUTION WIDTH: 16.9 % — ABNORMAL HIGH (ref 11.7–15.4)
WHITE BLOOD CELL COUNT: 3.8 10*3/uL (ref 3.4–10.8)

## 2023-04-22 LAB — COMPREHENSIVE METABOLIC PANEL
ALBUMIN: 4.1 g/dL (ref 3.9–4.9)
ALKALINE PHOSPHATASE: 208 IU/L — ABNORMAL HIGH (ref 44–121)
ALT (SGPT): 15 IU/L (ref 0–32)
AST (SGOT): 8 IU/L (ref 0–40)
BILIRUBIN TOTAL (MG/DL) IN SER/PLAS: 0.8 mg/dL (ref 0.0–1.2)
BLOOD UREA NITROGEN: 20 mg/dL (ref 6–24)
BUN / CREAT RATIO: 24 — ABNORMAL HIGH (ref 9–23)
CALCIUM: 8.9 mg/dL (ref 8.7–10.2)
CHLORIDE: 102 mmol/L (ref 96–106)
CO2: 24 mmol/L (ref 20–29)
CREATININE: 0.84 mg/dL (ref 0.57–1.00)
EGFR: 87 mL/min/{1.73_m2}
GLOBULIN, TOTAL: 1.7 g/dL (ref 1.5–4.5)
GLUCOSE: 181 mg/dL — ABNORMAL HIGH (ref 70–99)
POTASSIUM: 4.4 mmol/L (ref 3.5–5.2)
SODIUM: 142 mmol/L (ref 134–144)
TOTAL PROTEIN: 5.8 g/dL — ABNORMAL LOW (ref 6.0–8.5)

## 2023-04-22 LAB — PHOSPHORUS: PHOSPHORUS, SERUM: 3.9 mg/dL (ref 3.0–4.3)

## 2023-04-22 LAB — MAGNESIUM: MAGNESIUM: 1.6 mg/dL (ref 1.6–2.3)

## 2023-04-22 LAB — BILIRUBIN, DIRECT: BILIRUBIN DIRECT: 0.45 mg/dL — ABNORMAL HIGH (ref 0.00–0.40)

## 2023-04-22 MED ORDER — INSULIN LISPRO (U-100) 100 UNIT/ML SUBCUTANEOUS PEN
Freq: Three times a day (TID) | SUBCUTANEOUS | 11 refills | 63 days
Start: 2023-04-22 — End: ?

## 2023-04-22 MED ORDER — GABAPENTIN 400 MG CAPSULE
ORAL_CAPSULE | Freq: Two times a day (BID) | ORAL | 11 refills | 45 days
Start: 2023-04-22 — End: ?

## 2023-04-22 MED ORDER — TACROLIMUS 1 MG CAPSULE, IMMEDIATE-RELEASE
ORAL_CAPSULE | Freq: Two times a day (BID) | ORAL | 3 refills | 90 days
Start: 2023-04-22 — End: 2024-04-21

## 2023-04-22 MED ADMIN — pentamidine (PENTAM) inhalation solution: 300 mg | RESPIRATORY_TRACT | @ 19:00:00 | Stop: 2023-04-22

## 2023-04-22 MED ADMIN — albuterol 2.5 mg /3 mL (0.083 %) nebulizer solution 2.5 mg: 2.5 mg | RESPIRATORY_TRACT | @ 19:00:00 | Stop: 2023-04-22

## 2023-04-22 MED FILL — HEALTHYLAX 17 GRAM ORAL POWDER PACKET: ORAL | 30 days supply | Qty: 30 | Fill #1

## 2023-04-22 NOTE — Unmapped (Signed)
Per providers orders, Albuterol and Pentamidine treatments were administered.  Patient tolerated it well with no complication.  See MAR for administration info.

## 2023-04-23 DIAGNOSIS — Z944 Liver transplant status: Principal | ICD-10-CM

## 2023-04-23 DIAGNOSIS — Z796 Long-term use of immunosuppressant medication: Principal | ICD-10-CM

## 2023-04-23 DIAGNOSIS — Z4689 Encounter for fitting and adjustment of other specified devices: Principal | ICD-10-CM

## 2023-04-23 LAB — TACROLIMUS LEVEL: TACROLIMUS BLOOD: 17.1 ng/mL (ref 2.0–20.0)

## 2023-04-23 MED ORDER — INSULIN LISPRO (U-100) 100 UNIT/ML SUBCUTANEOUS PEN
Freq: Three times a day (TID) | SUBCUTANEOUS | 11 refills | 63 days | Status: CP
Start: 2023-04-23 — End: ?

## 2023-04-23 MED ORDER — TACROLIMUS 1 MG CAPSULE, IMMEDIATE-RELEASE
ORAL_CAPSULE | Freq: Two times a day (BID) | ORAL | 3 refills | 90 days | Status: CP
Start: 2023-04-23 — End: 2023-04-23

## 2023-04-23 MED ORDER — GABAPENTIN 400 MG CAPSULE
ORAL_CAPSULE | Freq: Two times a day (BID) | ORAL | 11 refills | 45 days | Status: CP
Start: 2023-04-23 — End: ?
  Filled 2023-05-20: qty 90, 45d supply, fill #0

## 2023-04-23 NOTE — Unmapped (Signed)
Patient's 8/29 KUB notable for continued presence of plastic pancreatic stent. Report reviewed by Dr.Chen and discussed with Dr.Baron, who felt that repeat egd should be ordered now for removal, which Dr.Chen approved. Contacted patient and explained that she would need 2 egd procedures to remove each stent, since one must be removed long before the other one to prevent infection, and that GI procedures would reach out to her to schedule one for the next few weeks. She confirmed she started taking 4mg  Tac dose prescribed yesterday. She reports she has had a scratch throat, but denies sore throat, body aches or fever. Suggested she try one of the non-drowsy otc allergy meds, such as zyrtec, claritin or allegra. She verbalized understanding. Tac level from 8/28 still pending.

## 2023-04-23 NOTE — Unmapped (Addendum)
Patient's 8/28 Tac level resulted last this afternoon at 17.1. Discussed with PharmD Chargualaf, who again suggested reducing her dose to 4mg /3mg  daily after having her skip the next two doses.Contacted Landscape architect for [employer], Tobi Bastos [phone]). Spoke with patient and relayed recommendations. Also, assured clarification of prior recommendation on otc allergy meds, to avoid those with -D after the name, such as Allegra-D, Claritin-D, and Zyrtec-D, since they contain decongestants. Reviewed that labs next week should be on Tues/Thur only d/t holiday week. She verbalized understanding of all discussed.

## 2023-04-24 LAB — COMPREHENSIVE METABOLIC PANEL
ALBUMIN: 4 g/dL (ref 3.9–4.9)
ALKALINE PHOSPHATASE: 179 IU/L — ABNORMAL HIGH (ref 44–121)
ALT (SGPT): 17 IU/L (ref 0–32)
AST (SGOT): 13 IU/L (ref 0–40)
BILIRUBIN TOTAL (MG/DL) IN SER/PLAS: 0.8 mg/dL (ref 0.0–1.2)
BLOOD UREA NITROGEN: 16 mg/dL (ref 6–24)
BUN / CREAT RATIO: 20 (ref 9–23)
CALCIUM: 9.3 mg/dL (ref 8.7–10.2)
CHLORIDE: 105 mmol/L (ref 96–106)
CO2: 24 mmol/L (ref 20–29)
CREATININE: 0.81 mg/dL (ref 0.57–1.00)
EGFR: 91 mL/min/{1.73_m2}
GLOBULIN, TOTAL: 1.9 g/dL (ref 1.5–4.5)
GLUCOSE: 127 mg/dL — ABNORMAL HIGH (ref 70–99)
POTASSIUM: 4.6 mmol/L (ref 3.5–5.2)
SODIUM: 143 mmol/L (ref 134–144)
TOTAL PROTEIN: 5.9 g/dL — ABNORMAL LOW (ref 6.0–8.5)

## 2023-04-24 LAB — CBC W/ DIFFERENTIAL
BANDED NEUTROPHILS ABSOLUTE COUNT: 0 10*3/uL (ref 0.0–0.1)
BASOPHILS ABSOLUTE COUNT: 0 10*3/uL (ref 0.0–0.2)
BASOPHILS RELATIVE PERCENT: 0 %
EOSINOPHILS ABSOLUTE COUNT: 0.1 10*3/uL (ref 0.0–0.4)
EOSINOPHILS RELATIVE PERCENT: 3 %
HEMATOCRIT: 35.2 % (ref 34.0–46.6)
HEMOGLOBIN: 11.5 g/dL (ref 11.1–15.9)
IMMATURE GRANULOCYTES: 0 %
LYMPHOCYTES ABSOLUTE COUNT: 0.6 10*3/uL — ABNORMAL LOW (ref 0.7–3.1)
LYMPHOCYTES RELATIVE PERCENT: 20 %
MEAN CORPUSCULAR HEMOGLOBIN CONC: 32.7 g/dL (ref 31.5–35.7)
MEAN CORPUSCULAR HEMOGLOBIN: 29.3 pg (ref 26.6–33.0)
MEAN CORPUSCULAR VOLUME: 90 fL (ref 79–97)
MONOCYTES ABSOLUTE COUNT: 0.2 10*3/uL (ref 0.1–0.9)
MONOCYTES RELATIVE PERCENT: 7 %
NEUTROPHILS ABSOLUTE COUNT: 2.2 10*3/uL (ref 1.4–7.0)
NEUTROPHILS RELATIVE PERCENT: 70 %
PLATELET COUNT: 69 10*3/uL — CL (ref 150–450)
RED BLOOD CELL COUNT: 3.93 x10E6/uL (ref 3.77–5.28)
RED CELL DISTRIBUTION WIDTH: 15.4 % (ref 11.7–15.4)
WHITE BLOOD CELL COUNT: 3.1 10*3/uL — ABNORMAL LOW (ref 3.4–10.8)

## 2023-04-24 LAB — BILIRUBIN, DIRECT: BILIRUBIN DIRECT: 0.45 mg/dL — ABNORMAL HIGH (ref 0.00–0.40)

## 2023-04-24 LAB — MAGNESIUM: MAGNESIUM: 1.7 mg/dL (ref 1.6–2.3)

## 2023-04-24 LAB — PHOSPHORUS: PHOSPHORUS, SERUM: 4.4 mg/dL — ABNORMAL HIGH (ref 3.0–4.3)

## 2023-04-24 LAB — GAMMA GT: GAMMA GLUTAMYL TRANSFERASE: 54 IU/L (ref 0–60)

## 2023-04-24 MED ORDER — TACROLIMUS 1 MG CAPSULE, IMMEDIATE-RELEASE
ORAL_CAPSULE | 11 refills | 0 days | Status: CP
Start: 2023-04-24 — End: ?

## 2023-04-25 LAB — TACROLIMUS LEVEL: TACROLIMUS BLOOD: 17.9 ng/mL (ref 2.0–20.0)

## 2023-04-26 DIAGNOSIS — Z944 Liver transplant status: Principal | ICD-10-CM

## 2023-04-27 DIAGNOSIS — Z944 Liver transplant status: Principal | ICD-10-CM

## 2023-04-28 LAB — COMPREHENSIVE METABOLIC PANEL
ALBUMIN: 4.1 g/dL (ref 3.9–4.9)
ALKALINE PHOSPHATASE: 148 IU/L — ABNORMAL HIGH (ref 44–121)
ALT (SGPT): 14 IU/L (ref 0–32)
AST (SGOT): 12 IU/L (ref 0–40)
BILIRUBIN TOTAL (MG/DL) IN SER/PLAS: 0.7 mg/dL (ref 0.0–1.2)
BLOOD UREA NITROGEN: 23 mg/dL (ref 6–24)
BUN / CREAT RATIO: 29 — ABNORMAL HIGH (ref 9–23)
CALCIUM: 8.8 mg/dL (ref 8.7–10.2)
CHLORIDE: 102 mmol/L (ref 96–106)
CO2: 26 mmol/L (ref 20–29)
CREATININE: 0.78 mg/dL (ref 0.57–1.00)
EGFR: 95 mL/min/{1.73_m2}
GLOBULIN, TOTAL: 1.6 g/dL (ref 1.5–4.5)
GLUCOSE: 141 mg/dL — ABNORMAL HIGH (ref 70–99)
POTASSIUM: 4.4 mmol/L (ref 3.5–5.2)
SODIUM: 142 mmol/L (ref 134–144)
TOTAL PROTEIN: 5.7 g/dL — ABNORMAL LOW (ref 6.0–8.5)

## 2023-04-28 LAB — CBC W/ DIFFERENTIAL
BANDED NEUTROPHILS ABSOLUTE COUNT: 0 10*3/uL (ref 0.0–0.1)
BASOPHILS ABSOLUTE COUNT: 0 10*3/uL (ref 0.0–0.2)
BASOPHILS RELATIVE PERCENT: 0 %
EOSINOPHILS ABSOLUTE COUNT: 0.1 10*3/uL (ref 0.0–0.4)
EOSINOPHILS RELATIVE PERCENT: 4 %
HEMATOCRIT: 34.2 % (ref 34.0–46.6)
HEMOGLOBIN: 11.1 g/dL (ref 11.1–15.9)
IMMATURE GRANULOCYTES: 0 %
LYMPHOCYTES ABSOLUTE COUNT: 0.6 10*3/uL — ABNORMAL LOW (ref 0.7–3.1)
LYMPHOCYTES RELATIVE PERCENT: 23 %
MEAN CORPUSCULAR HEMOGLOBIN CONC: 32.5 g/dL (ref 31.5–35.7)
MEAN CORPUSCULAR HEMOGLOBIN: 28.7 pg (ref 26.6–33.0)
MEAN CORPUSCULAR VOLUME: 88 fL (ref 79–97)
MONOCYTES ABSOLUTE COUNT: 0.2 10*3/uL (ref 0.1–0.9)
MONOCYTES RELATIVE PERCENT: 8 %
NEUTROPHILS ABSOLUTE COUNT: 1.8 10*3/uL (ref 1.4–7.0)
NEUTROPHILS RELATIVE PERCENT: 65 %
PLATELET COUNT: 85 10*3/uL — CL (ref 150–450)
RED BLOOD CELL COUNT: 3.87 x10E6/uL (ref 3.77–5.28)
RED CELL DISTRIBUTION WIDTH: 15.9 % — ABNORMAL HIGH (ref 11.7–15.4)
WHITE BLOOD CELL COUNT: 2.8 10*3/uL — ABNORMAL LOW (ref 3.4–10.8)

## 2023-04-28 LAB — BILIRUBIN, DIRECT: BILIRUBIN DIRECT: 0.35 mg/dL (ref 0.00–0.40)

## 2023-04-28 LAB — GAMMA GT: GAMMA GLUTAMYL TRANSFERASE: 41 IU/L (ref 0–60)

## 2023-04-28 LAB — PHOSPHORUS: PHOSPHORUS, SERUM: 4 mg/dL (ref 3.0–4.3)

## 2023-04-28 LAB — MAGNESIUM: MAGNESIUM: 1.8 mg/dL (ref 1.6–2.3)

## 2023-04-28 NOTE — Unmapped (Addendum)
[facility]    Patient Onboarding/Medication Counseling    Alicia Armstrong is a 47 y.o. female with a liver tranplant who I am counseling today on continuation of therapy.  I am speaking to the patient.    Was a Nurse, learning disability used for this call? No    Verified patient's date of birth / HIPAA.    Specialty medication(s) to be sent: Transplant: None- pt declined mycophenolate today.      Non-specialty medications/supplies to be sent: none      Medications not needed at this time: none     The patient declined counseling on missed dose instructions, goals of therapy, side effects and monitoring parameters, warnings and precautions, drug/food interactions, and storage, handling precautions, and disposal because they have taken the medication previously. The information in the declined sections below are for informational purposes only and was not discussed with patient.       Cellcept (mycopheonlate mofetil)    Medication & Administration     Dosage: Take 2  capsules  (500mg ) by mouth two times daily    Administration:   Take by mouth with or without food.   Taking with food can minimize GI side effects.   Swallow capsules whole, do not crush or chew.  Oral suspension should be shaken well prior to administration.  Do not mix with other medications and discard any unused portion 60 days after constitution.      Adherence/Missed dose instructions:  Take a missed dose as soon as you remember it . If it is close to the time of your next dose, skip the missed dose and resume your normal schedule.Never take 2 doses to try and catch up from a missed dose.    Goals of Therapy     Prevent disease progression    Side Effects & Monitoring Parameters     Feeling tired or weak  Shakiness  Trouble sleeping  Diarrhea, abdominal pain, nausea, vomiting, constipation or decreased appetite  Decreases in blood counts   Back or joint pain  Hypertension or hypotension  High blood sugar  Headache  Skin rash    The following side effects should be reported to the provider:  Reduced immune function - report signs of infection such as fever; chills; body aches; very bad sore throat; ear or sinus pain; cough; more sputum or change in color of sputum; pain with passing urine; wound that will not heal, etc.  Also at a slightly higher risk of some malignancies (mainly skin and blood cancers) due to this reduced immune function.  Allergic reaction (rash, hives, swelling, shortness of breath)  High blood sugar (confusion, feeling sleepy, more thirst, more hungry, passing urine more often, flushing, fast breathing, or breath that smells like fruit)  Electrolyte issues (mood changes, confusion, muscle pain or weakness, a heartbeat that does not feel normal, seizures, not hungry, or very bad upset stomach or throwing up)  High or low blood pressure (bad headache or dizziness, passing out, or change in eyesight)  Kidney issues (unable to pass urine, change in how much urine is passed, blood in the urine, or a big weight gain)  Skin (oozing, heat, swelling, redness, or pain), UTI and other infections   Chest pain or pressure  Abnormal heartbeat  Unexplained bleeding or bruising  Abnormal burning, numbness, or tingling  Muscle cramps,  Yellowing of skin or eyes    Monitoring parameters  Pregnancy   CBC   Renal and hepatic function  Contraindications, Warnings, & Precautions     *This is a REMS drug and an FDA-approved patient medication guide will be printed with each dispensation  Black Box Warning: Infections   Black Box Warning: Lymphoproliferative disorders - risk of development of lymphoma and skin malignancy is increased  Black Box Warning: Use during pregnancy is associated with increased risks of first trimester pregnancy loss and congenital malformations.   Black Box Warning: Females of reproductive potential should use contraception during treatment and for 6 weeks after therapy is discontinued  Is patient using an effective method of contraception? No  If yes, method of contraception:  perimenapausal  CNS depression  New or reactivated viral infections  Neutropenia  Female patients and/or their female partners should use effective contraception during treatment of the female patient and for at least 3 months after last dose.  Breastfeeding is not recommended during therapy and for 6 weeks after last dose    Drug/Food Interactions     Medication list reviewed in Epic. The patient was instructed to inform the care team before taking any new medications or supplements. No drug interactions identified.   Separate doses of antacids and this medication  Check with your doctor before getting any vaccinations    Storage, Handling Precautions, & Disposal     Store at room temperature in a dry place  This medication is considered hazardous. Wash hands after handling and store out of reach or others, including children and pets.      Current Medications (including OTC/herbals), Comorbidities and Allergies     Current Outpatient Medications   Medication Sig Dispense Refill    acetaminophen (TYLENOL) 325 MG tablet Take 1-2 tablets (325-650 mg total) by mouth every eight (8) hours as needed for pain or fever. 60 tablet 11    amlodipine (NORVASC) 5 MG tablet Take 1 tablet (5 mg total) by mouth daily. 30 tablet 11    aspirin (ECOTRIN) 81 MG tablet Take 1 tablet (81 mg total) by mouth daily. 30 tablet 11    blood sugar diagnostic (ACCU-CHEK GUIDE TEST STRIPS) Strp by Other route four (4) times a day. 100 strip 11    carvedilol (COREG) 6.25 MG tablet Take 1 tablet (6.25 mg total) by mouth two (2) times a day. 60 tablet 11    docusate sodium (COLACE) 100 MG capsule Take 1 capsule (100 mg total) by mouth two (2) times a day as needed for constipation. 60 capsule 11    gabapentin (NEURONTIN) 400 MG capsule Take 1 capsule (400 mg total) by mouth two (2) times a day. 90 capsule 11    insulin glargine (BASAGLAR, LANTUS) 100 unit/mL (3 mL) injection pen Inject 0.18 mL (18 Units total) under the skin nightly. 15 mL 3    insulin lispro (HUMALOG) 100 unit/mL injection pen Inject 4 Units under the skin Three (3) times a day before meals. Take in addition to sliding scale. Max 50 units per day 15 mL 11    lancets Misc Use to check blood sugar as directed with insulin 3 times a day & for symptoms of high or low blood sugar. 100 each 11    magnesium oxide-Mg AA chelate (MAGNESIUM, AMINO ACID CHELATE,) 133 mg Take 1 tablet by mouth two (2) times a day. 100 tablet 6    methocarbamol (ROBAXIN) 500 MG tablet Take 1 tablet (500 mg total) by mouth four (4) times a day as needed. 120 tablet 1    mycophenolate (CELLCEPT) 250 mg capsule Take 2  capsules (500 mg total) by mouth two (2) times a day. 120 capsule 11    pantoprazole (PROTONIX) 40 MG tablet Take 1 tablet (40 mg total) by mouth daily. 30 tablet 11    pen needle, diabetic 32 gauge x 5/32 (4 mm) Ndle Use with insulin up to 4 times/day as needed. 100 each 11    polyethylene glycol (MIRALAX) 17 gram packet Mix 1 packet in  4 to 8 ounces of liquid and drink daily as needed. 30 packet 11    predniSONE (DELTASONE) 5 MG tablet Take 1 tablet (5 mg total) by mouth daily. 30 tablet 5    SITagliptin phosphate (JANUVIA) 100 MG tablet Take 1 tablet (100 mg total) by mouth daily. 30 tablet 11    tacrolimus (PROGRAF) 1 MG capsule Take 4 capsules (4 mg total) by mouth daily AND 3 capsules (3 mg total) nightly. 210 capsule 11     No current facility-administered medications for this visit.       Allergies   Allergen Reactions    Penicillins Other (See Comments)     Had a rash with penicillin when she was 47 years old. She tolerated piperacillin/tazobactam 08/25/2016-08/28/2016 with itching without rash. Tolerated Augmentin.       Patient Active Problem List   Diagnosis    Decompensated hepatic cirrhosis (CMS-HCC)    Autoimmune hepatitis (CMS-HCC)    Peripheral edema    Secondary esophageal varices without bleeding (CMS-HCC)    Sepsis (CMS-HCC) Lower abdominal pain    Hyperammonemia (CMS-HCC)    Fever    Immunocompromised patient (CMS-HCC)    Metabolic acidosis    Leukopenia    Thrombocytopenia (CMS-HCC)    Hypokalemia    Abdominal pain    Pancytopenia (CMS-HCC)    Cellulitis of right lower extremity    Septic shock (CMS-HCC)    Obesity (BMI 30-39.9)    Acute kidney injury (CMS-HCC)    Coagulopathy (CMS-HCC)    Anasarca    Liver transplant recipient (CMS-HCC)    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    Appropriateness of Therapy     Acute infections noted within Epic:  No active infections  Patient reported infection: None    Is the medication and dose appropriate based on diagnosis, medication list, comorbidities, allergies, medical history, patient???s ability to self-administer the medication, and therapeutic goals? Yes    Prescription has been clinically reviewed: Yes      Baseline Quality of Life Assessment      How many days over the past month did your liver transplant  keep you from your normal activities? For example, brushing your teeth or getting up in the morning. 0    Financial Information     Medication Assistance provided: None Required    Anticipated copay of $4 reviewed with patient. Verified delivery address.    Delivery Information     Scheduled delivery date: Patient declined needing mycophenolate today.    Expected start date: Patient has medication at home and is taking.      Medication will be delivered via UPS to the prescription address in [facility].  This shipment will not require a signature.      Explained the services we provide at [facility] and that each month we would call to set up  refills.  Stressed importance of returning phone calls so that we could ensure they receive their medications in time each month.  Informed patient that we should be setting up refills 7-10 days prior to when they will run out of medication.  A pharmacist will reach out to perform a clinical assessment periodically.  Informed patient that a welcome packet, containing information about our pharmacy and other support services, a Notice of Privacy Practices, and a drug information handout will be sent.      The patient or caregiver noted above participated in the development of this care plan and knows that they can request review of or adjustments to the care plan at any time.      Patient or caregiver verbalized understanding of the above information as well as how to contact the pharmacy at [phone] option 4 with any questions/concerns.  The pharmacy is open Monday through Friday 8:30am-4:30pm.  A pharmacist is available 24/7 via pager to answer any clinical questions they may have.    Patient Specific Needs     Does the patient have any physical, cognitive, or cultural barriers? No    Does the patient have adequate living arrangements? (i.e. the ability to store and take their medication appropriately) Yes    Did you identify any home environmental safety or security hazards? No    Patient prefers to have medications discussed with  Patient     Is the patient or caregiver able to read and understand education materials at a high school level or above? Yes    Patient's primary language is  English     Is the patient high risk? Yes, patient is taking a REMS drug. Medication is dispensed in compliance with REMS program    SOCIAL DETERMINANTS OF HEALTH     At the [facility], we have learned that life circumstances - like trouble affording food, housing, utilities, or transportation can affect the health of many of our patients.   That is why we wanted to ask: are you currently experiencing any life circumstances that are negatively impacting your health and/or quality of life? Patient declined to answer    Social Determinants of Health     Food Insecurity: No Food Insecurity (02/23/2023)    Hunger Vital Sign     Worried About Running Out of Food in the Last Year: Never true     Ran Out of Food in the Last Year: Never true   Internet Connectivity: No Internet connectivity concern identified (02/23/2023)    Internet Connectivity     Do you have access to internet services: Yes     How do you connect to the internet: Personal Device at home     Is your internet connection strong enough for you to watch video on your device without major problems?: Yes     Do you have enough data to get through the month?: Yes     Does at least one of the devices have a camera that you can use for video chat?: Yes   Housing/Utilities: Low Risk  (02/23/2023)    Housing/Utilities     Within the past 12 months, have you ever stayed: outside, in a car, in a tent, in an overnight shelter, or temporarily in someone else's home (i.e. couch-surfing)?: No     Are you worried about losing your housing?: No     Within the past 12 months, have you been unable to get utilities (heat, electricity) when it was really  needed?: No   Tobacco Use: Low Risk  (05/18/2023)    Patient History     Smoking Tobacco Use: Never     Smokeless Tobacco Use: Never     Passive Exposure: Not on file   Transportation Needs: No Transportation Needs (02/23/2023)    PRAPARE - Transportation     Lack of Transportation (Medical): No     Lack of Transportation (Non-Medical): No   Alcohol Use: Not At Risk (02/23/2023)    Alcohol Use     How often do you have a drink containing alcohol?: Never     How many drinks containing alcohol do you have on a typical day when you are drinking?: 1 - 2     How often do you have 5 or more drinks on one occasion?: Never   Interpersonal Safety: Not At Risk (02/23/2023)    Interpersonal Safety     Unsafe Where You Currently Live: No     Physically Hurt by Anyone: No     Abused by Anyone: No   Physical Activity: Inactive (02/23/2023)    Exercise Vital Sign     Days of Exercise per Week: 0 days     Minutes of Exercise per Session: 0 min   Intimate Partner Violence: Not At Risk (02/23/2023)    Humiliation, Afraid, Rape, and Kick questionnaire     Fear of Current or Ex-Partner: No     Emotionally Abused: No     Physically Abused: No     Sexually Abused: No   Stress: No Stress Concern Present (02/23/2023)    [employer] of Occupational Health - Occupational Stress Questionnaire     Feeling of Stress : Not at all   Substance Use: Low Risk  (02/23/2023)    Substance Use     Taken prescription drugs for non-medical reasons: Never     Taken illegal drugs: Never     Patient indicated they have taken drugs in the past year for non-medical reasons: Yes, [positive answer(s)]: Not on file   Social Connections: Socially Integrated (02/23/2023)    Social Connection and Isolation Panel [NHANES]     Frequency of Communication with Friends and Family: More than three times a week     Frequency of Social Gatherings with Friends and Family: More than three times a week     Attends Religious Services: More than 4 times per year     Active Member of [employer] or Organizations: Yes     Attends Engineer, structural: More than 4 times per year     Marital Status: Married   Physicist, medical Strain: Low Risk  (02/23/2023)    Overall Financial Resource Strain (CARDIA)     Difficulty of Paying Living Expenses: Not very hard   Depression: Not at risk (02/23/2023)    PHQ-2     PHQ-2 Score: 0   Health Literacy: Medium Risk (02/23/2023)    Health Literacy     : Sometimes       Would you be willing to receive help with any of the needs that you have identified today? Not applicable       Tera Helper, [facility]  [facility] Specialty Pharmacist                [facility] Clinical Assessment & Refill Coordination Note    Alicia Armstrong, [place]: 05/21/1976  Phone: [phone] (home) [phone] (work)    All above  HIPAA information was verified with patient.     Was a Nurse, learning disability used for this call? No    Specialty Medication(s):   Transplant: tacrolimus 1mg  and Prevymis 480mg      Current Outpatient Medications   Medication Sig Dispense Refill    acetaminophen (TYLENOL) 325 MG tablet Take 1-2 tablets (325-650 mg total) by mouth every eight (8) hours as needed for pain or fever. 60 tablet 11    acyclovir (ZOVIRAX) 400 MG tablet Take 1 tablet (400 mg total) by mouth two (2) times a day. 60 tablet 1    amlodipine (NORVASC) 5 MG tablet Take 1 tablet (5 mg total) by mouth daily. 30 tablet 11    aspirin (ECOTRIN) 81 MG tablet Take 1 tablet (81 mg total) by mouth daily. 30 tablet 11    blood sugar diagnostic (ACCU-CHEK GUIDE TEST STRIPS) Strp by Other route four (4) times a day. 100 strip 11    carvedilol (COREG) 6.25 MG tablet Take 1 tablet (6.25 mg total) by mouth two (2) times a day. 60 tablet 11    docusate sodium (COLACE) 100 MG capsule Take 1 capsule (100 mg total) by mouth two (2) times a day as needed for constipation. 60 capsule 11    gabapentin (NEURONTIN) 400 MG capsule Take 1 capsule (400 mg total) by mouth two (2) times a day. 90 capsule 11    insulin glargine (BASAGLAR, LANTUS) 100 unit/mL (3 mL) injection pen Inject 25 units in the evening on 04/07/23 and 04/08/23, and then inject 16 units in the evening thereafter. 15 mL 0    insulin lispro (HUMALOG) 100 unit/mL injection pen Inject 8 Units under the skin Three (3) times a day before meals. Take in addition to sliding scale. Max 50 units per day 15 mL 11    lancets Misc Use to check blood sugar as directed with insulin 3 times a day & for symptoms of high or low blood sugar. 100 each 11    letermovir (PREVYMIS) 480 mg tablet Take 1 tablet (480 mg total) by mouth daily. 28 tablet 1    magnesium oxide-Mg AA chelate (MAGNESIUM, AMINO ACID CHELATE,) 133 mg Take 1 tablet by mouth two (2) times a day. 100 tablet 6    methocarbamol (ROBAXIN) 500 MG tablet Take 1 tablet (500 mg total) by mouth four (4) times a day as needed. 120 tablet 1    mycophenolate (CELLCEPT) 250 mg capsule Take 2 capsules (500 mg total) by mouth two (2) times a day. 120 capsule 11    pantoprazole (PROTONIX) 40 MG tablet Take 1 tablet (40 mg total) by mouth daily. 30 tablet 11    pen needle, diabetic 32 gauge x 5/32 (4 mm) Ndle Use with insulin up to 4 times/day as needed. 100 each 11    polyethylene glycol (MIRALAX) 17 gram packet Mix 1 packet in  4 to 8 ounces of liquid and drink daily as needed. 30 packet 11    predniSONE (DELTASONE) 5 MG tablet Take 3 tablets (15 mg) by mouth for 7 days then reduce to 2 tablets (10 mg) once daily 75 tablet 5    SITagliptin phosphate (JANUVIA) 100 MG tablet Take 1 tablet (100 mg total) by mouth daily. 30 tablet 11    tacrolimus (PROGRAF) 1 MG capsule Take Four caps (4mg ) in AM and Take Three caps (3mg ) at PM 210 capsule 11     No current facility-administered medications for this visit.  Changes to medications: Alicia Armstrong reports no changes at this time.    Allergies   Allergen Reactions    Penicillins Other (See Comments)     Had a rash with penicillin when she was 47 years old. She tolerated piperacillin/tazobactam 08/25/2016-08/28/2016 with itching without rash. Tolerated Augmentin.       Changes to allergies: No    SPECIALTY MEDICATION ADHERENCE     Tacrolimus 1 mg: 15 days of medicine on hand   Prevymis 480 mg: 5 days of medicine on hand     Medication Adherence    Patient reported X missed doses in the last month: 0  Specialty Medication: Tacrolimus 1mg   Patient is on additional specialty medications: Yes  Additional Specialty Medications: Prevymis 480mg   Patient Reported Additional Medication X Missed Doses in the Last Month: 0  Patient is on more than two specialty medications: No          Specialty medication(s) dose(s) confirmed: Regimen is correct and unchanged.     Are there any concerns with adherence? No    Adherence counseling provided? Not needed    CLINICAL MANAGEMENT AND INTERVENTION      Clinical Benefit Assessment:    Do you feel the medicine is effective or helping your condition? Yes    Clinical Benefit counseling provided? Not needed    Adverse Effects Assessment:    Are you experiencing any side effects? No    Are you experiencing difficulty administering your medicine? 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 For example, brushing your teeth or getting up in the morning. 0    Have you discussed this with your provider? Not needed    Acute Infection Status:    Acute infections noted within Epic:  No active infections  Patient reported infection: None    Therapy Appropriateness:    Is therapy appropriate based on current medication list, adverse reactions, adherence, clinical benefit and progress toward achieving therapeutic goals? Yes, therapy is appropriate and should be continued     DISEASE/MEDICATION-SPECIFIC INFORMATION      N/A    Solid Organ Transplant: Not Applicable    PATIENT SPECIFIC NEEDS     Does the patient have any physical, cognitive, or cultural barriers? No    Is the patient high risk? Yes, patient is taking a REMS drug. Medication is dispensed in compliance with REMS program    Did the patient require a clinical intervention? No    Does the patient require physician intervention or other additional services (i.e., nutrition, smoking cessation, social work)? No    SOCIAL DETERMINANTS OF HEALTH     At the [facility], we have learned that life circumstances - like trouble affording food, housing, utilities, or transportation can affect the health of many of our patients.   That is why we wanted to ask: are you currently experiencing any life circumstances that are negatively impacting your health and/or quality of life? Patient declined to answer    Social Determinants of Health     Financial Resource Strain: Low Risk  (02/23/2023)    Overall Financial Resource Strain (CARDIA)     Difficulty of Paying Living Expenses: Not very hard   Internet Connectivity: No Internet connectivity concern identified (02/23/2023)  Internet Connectivity     Do you have access to internet services: Yes     How do you connect to the internet: Personal Device at home     Is your internet connection strong enough for you to watch video on your device without major problems?: Yes     Do you have enough data to get through the month?: Yes     Does at least one of the devices have a camera that you can use for video chat?: Yes   Food Insecurity: No Food Insecurity (02/23/2023)    Hunger Vital Sign     Worried About Running Out of Food in the Last Year: Never true     Ran Out of Food in the Last Year: Never true   Tobacco Use: Low Risk  (04/22/2023)    Patient History     Smoking Tobacco Use: Never     Smokeless Tobacco Use: Never     Passive Exposure: Not on file   Housing/Utilities: Low Risk  (02/23/2023)    Housing/Utilities     Within the past 12 months, have you ever stayed: outside, in a car, in a tent, in an overnight shelter, or temporarily in someone else's home (i.e. couch-surfing)?: No     Are you worried about losing your housing?: No     Within the past 12 months, have you been unable to get utilities (heat, electricity) when it was really needed?: No   Alcohol Use: Not At Risk (02/23/2023)    Alcohol Use     How often do you have a drink containing alcohol?: Never     How many drinks containing alcohol do you have on a typical day when you are drinking?: 1 - 2     How often do you have 5 or more drinks on one occasion?: Never   Transportation Needs: No Transportation Needs (02/23/2023)    PRAPARE - Transportation     Lack of Transportation (Medical): No     Lack of Transportation (Non-Medical): No   Substance Use: Low Risk  (02/23/2023)    Substance Use     Taken prescription drugs for non-medical reasons: Never     Taken illegal drugs: Never     Patient indicated they have taken drugs in the past year for non-medical reasons: Yes, [positive answer(s)]: Not on file   Health Literacy: Medium Risk (02/23/2023)    Health Literacy     : Sometimes   Physical Activity: Inactive (02/23/2023)    Exercise Vital Sign     Days of Exercise per Week: 0 days     Minutes of Exercise per Session: 0 min   Interpersonal Safety: Not At Risk (02/23/2023)    Interpersonal Safety     Unsafe Where You Currently Live: No     Physically Hurt by Anyone: No     Abused by Anyone: No   Stress: No Stress Concern Present (02/23/2023)    [employer] of Occupational Health - Occupational Stress Questionnaire     Feeling of Stress : Not at all   Intimate Partner Violence: Not At Risk (02/23/2023)    Humiliation, Afraid, Rape, and Kick questionnaire     Fear of Current or Ex-Partner: No     Emotionally Abused: No     Physically Abused: No     Sexually Abused: No   Depression: Not at risk (02/23/2023)    PHQ-2     PHQ-2 Score: 0   Social Connections: Socially Integrated (  02/23/2023)    Social Connection and Isolation Panel [NHANES]     Frequency of Communication with Friends and Family: More than three times a week     Frequency of Social Gatherings with Friends and Family: More than three times a week     Attends Religious Services: More than 4 times per year     Active Member of [employer] or Organizations: Yes     Attends Engineer, structural: More than 4 times per year     Marital Status: Married       Would you be willing to receive help with any of the needs that you have identified today? Not applicable       SHIPPING     Specialty Medication(s) to be Shipped:   Transplant: Prevymis 480mg     Other medication(s) to be shipped:  carvedilol, amlodipine, mg, test strips     Changes to insurance: No    Delivery Scheduled: Yes, Expected medication delivery date: 04/30/23.     Medication will be delivered via UPS to the confirmed prescription address in [facility].    The patient will receive a drug information handout for each medication shipped and additional FDA Medication Guides as required.  Verified that patient has previously received a Conservation officer, historic buildings and a Surveyor, mining.    The patient or caregiver noted above participated in the development of this care plan and knows that they can request review of or adjustments to the care plan at any time.      All of the patient's questions and concerns have been addressed.    Tera Helper, [facility]   [facility] Specialty Pharmacist

## 2023-04-29 DIAGNOSIS — Z944 Liver transplant status: Principal | ICD-10-CM

## 2023-04-29 DIAGNOSIS — Z796 Long-term use of immunosuppressant medication: Principal | ICD-10-CM

## 2023-04-29 DIAGNOSIS — T8641 Liver transplant rejection: Principal | ICD-10-CM

## 2023-04-29 LAB — TACROLIMUS LEVEL: TACROLIMUS BLOOD: 13.7 ng/mL (ref 2.0–20.0)

## 2023-04-29 MED ORDER — TACROLIMUS 1 MG CAPSULE, IMMEDIATE-RELEASE
ORAL_CAPSULE | Freq: Two times a day (BID) | ORAL | 11 refills | 30 days | Status: CP
Start: 2023-04-29 — End: ?

## 2023-04-29 MED ORDER — PREDNISONE 5 MG TABLET
ORAL_TABLET | Freq: Every day | ORAL | 5 refills | 30 days | Status: CP
Start: 2023-04-29 — End: ?
  Filled 2023-05-20: qty 30, 30d supply, fill #0

## 2023-04-29 MED FILL — MG-PLUS-PROTEIN 133 MG TABLET: ORAL | 50 days supply | Qty: 100 | Fill #0

## 2023-04-29 MED FILL — ACCU-CHEK GUIDE TEST STRIPS: 25 days supply | Qty: 100 | Fill #0

## 2023-04-29 MED FILL — CARVEDILOL 6.25 MG TABLET: ORAL | 30 days supply | Qty: 60 | Fill #0

## 2023-04-29 NOTE — Unmapped (Signed)
Alicia Armstrong 's prevymis shipment will be delayed as a result of prior authorization being required by the patient's insurance.     I have spoken with the patient via interpreter at [phone]  and communicated the delay. We will call the patient back to reschedule the delivery upon resolution. We have not confirmed the new delivery date.

## 2023-04-29 NOTE — Unmapped (Signed)
Patient's 9/3 tac result was supratherapeutic at 13.7. Sent msg to PharmD [name] re.dose adjustment. She sent msg to reduce prednisone to 5mg  daily, since lfts have been stable and suggested Tac dose reduction of 3mg  bid daily. Spoke with patient and relayed changes. She returned verbalization of changes and marked them down. Since she had not yet been contacted for stent removal, provided her with GI procedures scheduling number.

## 2023-04-30 LAB — CBC W/ DIFFERENTIAL
BANDED NEUTROPHILS ABSOLUTE COUNT: 0 10*3/uL (ref 0.0–0.1)
BASOPHILS ABSOLUTE COUNT: 0 10*3/uL (ref 0.0–0.2)
BASOPHILS RELATIVE PERCENT: 0 %
EOSINOPHILS ABSOLUTE COUNT: 0.1 10*3/uL (ref 0.0–0.4)
EOSINOPHILS RELATIVE PERCENT: 4 %
HEMATOCRIT: 32.6 % — ABNORMAL LOW (ref 34.0–46.6)
HEMOGLOBIN: 11 g/dL — ABNORMAL LOW (ref 11.1–15.9)
IMMATURE GRANULOCYTES: 0 %
LYMPHOCYTES ABSOLUTE COUNT: 0.5 10*3/uL — ABNORMAL LOW (ref 0.7–3.1)
LYMPHOCYTES RELATIVE PERCENT: 21 %
MEAN CORPUSCULAR HEMOGLOBIN CONC: 33.7 g/dL (ref 31.5–35.7)
MEAN CORPUSCULAR HEMOGLOBIN: 29.2 pg (ref 26.6–33.0)
MEAN CORPUSCULAR VOLUME: 87 fL (ref 79–97)
MONOCYTES ABSOLUTE COUNT: 0.3 10*3/uL (ref 0.1–0.9)
MONOCYTES RELATIVE PERCENT: 10 %
NEUTROPHILS ABSOLUTE COUNT: 1.6 10*3/uL (ref 1.4–7.0)
NEUTROPHILS RELATIVE PERCENT: 65 %
PLATELET COUNT: 100 10*3/uL — CL (ref 150–450)
RED BLOOD CELL COUNT: 3.77 x10E6/uL (ref 3.77–5.28)
RED CELL DISTRIBUTION WIDTH: 15.2 % (ref 11.7–15.4)
WHITE BLOOD CELL COUNT: 2.5 10*3/uL — CL (ref 3.4–10.8)

## 2023-04-30 LAB — MAGNESIUM: MAGNESIUM: 1.6 mg/dL (ref 1.6–2.3)

## 2023-04-30 LAB — COMPREHENSIVE METABOLIC PANEL
ALBUMIN: 4 g/dL (ref 3.9–4.9)
ALKALINE PHOSPHATASE: 138 IU/L — ABNORMAL HIGH (ref 44–121)
ALT (SGPT): 12 IU/L (ref 0–32)
AST (SGOT): 7 IU/L (ref 0–40)
BILIRUBIN TOTAL (MG/DL) IN SER/PLAS: 0.7 mg/dL (ref 0.0–1.2)
BLOOD UREA NITROGEN: 20 mg/dL (ref 6–24)
BUN / CREAT RATIO: 25 — ABNORMAL HIGH (ref 9–23)
CALCIUM: 8.9 mg/dL (ref 8.7–10.2)
CHLORIDE: 102 mmol/L (ref 96–106)
CO2: 25 mmol/L (ref 20–29)
CREATININE: 0.79 mg/dL (ref 0.57–1.00)
EGFR: 93 mL/min/{1.73_m2}
GLOBULIN, TOTAL: 1.9 g/dL (ref 1.5–4.5)
GLUCOSE: 181 mg/dL — ABNORMAL HIGH (ref 70–99)
POTASSIUM: 4.2 mmol/L (ref 3.5–5.2)
SODIUM: 140 mmol/L (ref 134–144)
TOTAL PROTEIN: 5.9 g/dL — ABNORMAL LOW (ref 6.0–8.5)

## 2023-04-30 LAB — GAMMA GT: GAMMA GLUTAMYL TRANSFERASE: 37 IU/L (ref 0–60)

## 2023-04-30 LAB — PHOSPHORUS: PHOSPHORUS, SERUM: 3.6 mg/dL (ref 3.0–4.3)

## 2023-04-30 LAB — BILIRUBIN, DIRECT: BILIRUBIN DIRECT: 0.34 mg/dL (ref 0.00–0.40)

## 2023-04-30 NOTE — Unmapped (Addendum)
[facility] Pharmacist has reviewed a new prescription for tacrolimus that indicates a dose decrease.  Patient was counseled on this dosage change by [facility]- see epic note from 04/29/23.  Next refill call date adjusted if necessary.        Clinical Assessment Needed For: Dose Change  Medication: Tacrolimus 1mg  capsule  Last Fill Date/Day Supply: 04/07/2023 / 30 days  Copay $9.06  Was previous dose already scheduled to fill: No    Notes to Pharmacist: Prednisone - $2.33

## 2023-05-02 LAB — TACROLIMUS LEVEL: TACROLIMUS BLOOD: 9.5 ng/mL (ref 2.0–20.0)

## 2023-05-03 DIAGNOSIS — E119 Type 2 diabetes mellitus without complications: Principal | ICD-10-CM

## 2023-05-03 DIAGNOSIS — K7460 Unspecified cirrhosis of liver: Principal | ICD-10-CM

## 2023-05-03 DIAGNOSIS — R188 Other ascites: Principal | ICD-10-CM

## 2023-05-03 MED ORDER — INSULIN GLARGINE (U-100) 100 UNIT/ML (3 ML) SUBCUTANEOUS PEN
Freq: Every evening | SUBCUTANEOUS | 0 refills | 30 days | Status: CP
Start: 2023-05-03 — End: 2023-06-02

## 2023-05-03 MED ORDER — INSULIN LISPRO (U-100) 100 UNIT/ML SUBCUTANEOUS PEN
Freq: Three times a day (TID) | SUBCUTANEOUS | 11 refills | 125 days | Status: CP
Start: 2023-05-03 — End: ?

## 2023-05-03 NOTE — Unmapped (Signed)
EGD  Procedure #1     Procedure #2   [id number]  MRN   Hathorn   Endoscopist     Is the patient's health insurance [employer], Aet[place], [place] [employer] (UHC), UHC Med [place], [employer], or [place]?     Urgent procedure     Are you pregnant?     Are you in the process of scheduling or awaiting results of a heart ultrasound, stress test, or catheterization to evaluate new or worsening chest pain, dizziness, or shortness of breath?     Do you take: Plavix (clopidogrel), Coumadin (warfarin), Lovenox (enoxaparin), Pradaxa (dabigatran), Effient (prasugrel), Xarelto (rivaroxaban), Eliquis (apixaban), Pletal (cilostazol), or Brilinta (ticagrelor)?          Did ordering provider indicate how long to hold this medication in the order comments?          Which of the above medications are you taking?          What is the name of the medical practice that manages this medication?          What is the name of the medical provider who manages this medication?   TRUE  Do you have hemophilia, von Willebrand disease, or low platelets?     Do you have a pacemaker or implanted cardiac defibrillator?   TRUE  Has a [facility] provider specified the location(s)?     Which location(s) did the [facility] provider specify?   TRUE       [facility]          [place]          [facility]   TRUE  Do you see a liver specialist for chronic liver disease?     Is the procedure indication for variceal screening?     Is procedure indication for variceal banding (this does NOT include variceal screening)?     Have you had a heart attack, stroke or heart stent placement within the past 6 months?     Month of event     Year of event (ONLY ENTER LAST 2 DIGITS)        5  Height (feet)   2  Height (inches)   137  Weight (pounds)   25.1  BMI          Did the ordering provider specify a bowel prep?          What bowel prep was specified?     Do you have an ostomy (bag on your stomach that collects your stool)?          Is it an ileostomy?          Is it a colostomy?          Patient doesn't know.     Do you have chronic kidney disease?     Do you have chronic constipation or have you had poor quality bowel preps for past colonoscopies?     Do you have Crohn's disease or ulcerative colitis?     Have you had weight loss surgery?          When you walk around your house or grocery store, do you have to stop and rest due to shortness of breath, chest pain, or light-headedness?     Do you ever use supplemental oxygen?   TRUE  Have you been hospitalized for cirrhosis of the liver or heart failure in the last 12 months?     Have you been treated for mouth or  throat cancer with radiation or surgery?     Have you been told that it is difficult for doctors to insert a breathing tube in you during anesthesia?     Have you had a heart or lung transplant?          Are you on dialysis?   TRUE  Do you have cirrhosis of the liver?     Do you have myasthenia gravis?     Is the patient a prisoner?   ################# ## ###################################################################################################################   MRN:  [id number]   Anticoag Review  No   Nurse Triage  No   GI clinic consult  No   Procedure(s):  EGD     0   Endoscopist:  Hathorn    Urgent:  No   Prep:                    --------------------------- --- ----------------------------------------------------------------------------------------------------------------------------------------------------------------------------   [facility]ations[facility]                  Requested Locations: [facility]             ################# ## ###################################################################################################################

## 2023-05-04 LAB — PHOSPHORUS: PHOSPHORUS, SERUM: 3.8 mg/dL (ref 3.0–4.3)

## 2023-05-04 LAB — COMPREHENSIVE METABOLIC PANEL
ALBUMIN: 4.3 g/dL (ref 3.9–4.9)
ALKALINE PHOSPHATASE: 139 IU/L — ABNORMAL HIGH (ref 44–121)
ALT (SGPT): 18 IU/L (ref 0–32)
AST (SGOT): 14 IU/L (ref 0–40)
BILIRUBIN TOTAL (MG/DL) IN SER/PLAS: 0.7 mg/dL (ref 0.0–1.2)
BLOOD UREA NITROGEN: 19 mg/dL (ref 6–24)
BUN / CREAT RATIO: 23 (ref 9–23)
CALCIUM: 8.9 mg/dL (ref 8.7–10.2)
CHLORIDE: 104 mmol/L (ref 96–106)
CO2: 25 mmol/L (ref 20–29)
CREATININE: 0.82 mg/dL (ref 0.57–1.00)
EGFR: 89 mL/min/{1.73_m2}
GLOBULIN, TOTAL: 1.6 g/dL (ref 1.5–4.5)
GLUCOSE: 120 mg/dL — ABNORMAL HIGH (ref 70–99)
POTASSIUM: 4.3 mmol/L (ref 3.5–5.2)
SODIUM: 142 mmol/L (ref 134–144)
TOTAL PROTEIN: 5.9 g/dL — ABNORMAL LOW (ref 6.0–8.5)

## 2023-05-04 LAB — MAGNESIUM: MAGNESIUM: 1.9 mg/dL (ref 1.6–2.3)

## 2023-05-04 LAB — CBC W/ DIFFERENTIAL
BANDED NEUTROPHILS ABSOLUTE COUNT: 0 10*3/uL (ref 0.0–0.1)
BASOPHILS ABSOLUTE COUNT: 0 10*3/uL (ref 0.0–0.2)
BASOPHILS RELATIVE PERCENT: 0 %
EOSINOPHILS ABSOLUTE COUNT: 0.2 10*3/uL (ref 0.0–0.4)
EOSINOPHILS RELATIVE PERCENT: 5 %
HEMATOCRIT: 35.7 % (ref 34.0–46.6)
HEMOGLOBIN: 11.8 g/dL (ref 11.1–15.9)
IMMATURE GRANULOCYTES: 0 %
LYMPHOCYTES ABSOLUTE COUNT: 0.7 10*3/uL (ref 0.7–3.1)
LYMPHOCYTES RELATIVE PERCENT: 20 %
MEAN CORPUSCULAR HEMOGLOBIN CONC: 33.1 g/dL (ref 31.5–35.7)
MEAN CORPUSCULAR HEMOGLOBIN: 29.2 pg (ref 26.6–33.0)
MEAN CORPUSCULAR VOLUME: 88 fL (ref 79–97)
MONOCYTES ABSOLUTE COUNT: 0.3 10*3/uL (ref 0.1–0.9)
MONOCYTES RELATIVE PERCENT: 9 %
NEUTROPHILS ABSOLUTE COUNT: 2.1 10*3/uL (ref 1.4–7.0)
NEUTROPHILS RELATIVE PERCENT: 66 %
PLATELET COUNT: 105 10*3/uL — ABNORMAL LOW (ref 150–450)
RED BLOOD CELL COUNT: 4.04 x10E6/uL (ref 3.77–5.28)
RED CELL DISTRIBUTION WIDTH: 15.2 % (ref 11.7–15.4)
WHITE BLOOD CELL COUNT: 3.2 10*3/uL — ABNORMAL LOW (ref 3.4–10.8)

## 2023-05-04 LAB — BILIRUBIN, DIRECT: BILIRUBIN DIRECT: 0.29 mg/dL (ref 0.00–0.40)

## 2023-05-04 LAB — GAMMA GT: GAMMA GLUTAMYL TRANSFERASE: 31 IU/L (ref 0–60)

## 2023-05-04 NOTE — Unmapped (Signed)
Contacted by PharmD Chargualaf that patient's insurance changed and will no longer cover letermovir. She estimated patient only had a few days left of her letermovir and recommended that the day after she her last dose, she increase her her Tac dose to 5mg  BID to avoid her having her Tac level bottom out. Since it would only be a couple weeks before 10 or so before her 3mos txp anniversary, no additional coverage would be needed for CMV ppx.     [address] [place] [phone]) and reached out to the patient and explained that her insurance had changed and would no longer provide letermovir coverage. Patient reported she has 8 more days of medication. Withheld plan for dose change to avoid confusion, since her Tac level has been running too high. Explained that his TNC would call back with necessary change on 9/18. She verbalized understanding. She also reported that her blood sugars have not dropped below 100 since our conversation yesterday, and she has been feeling alright.

## 2023-05-04 NOTE — Unmapped (Signed)
Patient paged TNC to report that she obtained an appt for her EGD on Wed. She also requested TNC return call with an interpreter to discuss some problems with her blood sugar. [address], [place] [phone]) and then spoke with patient. Patient explained that she had been having episodes of low blood glucose, resulting in weakness, shakiness, sweating and feeling impending doom. Even with interpreter, patient had difficulty articulating when this occurred, but she finally said it started early morning(2am) on 8/31 with glucose at 66 and improved after drinking OJ and eating candy. The other episodes happened AFTER lunch on 9/7 (64) and today at (66). Patient endorsed sx during this call, so this TNC requested that she take a break while we remained on the call, to drink OJ and eat a simple carb, and she retook her glucose a little while later, which improved to 78 during and sx were resolving. She reports she was administering 20 units of basaglar at HS and 8 units of lispro + SS before meals IF her glucose was >150. The basaglar order on 8/14 specified that she should reduce the HS basaglar on 8/16 to 16 units, not 20. Other AC glucose checks ranged as follows: Breakfast from 97-208, Lunch from 102-247, Dinner from 207-324 and Bedtime from 89-307. She confirmed that she reduced her prednisone to 5mg  as directed on Friday. Discussed all this information with PharmD Chargualaf, who recommended reducing her basaglar to 18units at [facility] and her lispro to 4units +SS for glucose >150. Returned call with Spanish interpreter Albin Felling [phone]) and relayed all information. Patient verbalized understanding and returned explanation of instructions. Greater than 80 min spent on today's patient calls.

## 2023-05-04 NOTE — Unmapped (Signed)
Alicia Armstrong 's Prevymis shipment will be canceled as a result of PA denied. MAPs informed Clinic POCs.     I have spoken with the patient via interpreter at [phone]  and communicated the delay. We will not reschedule the medication and have removed this/these medication(s) from the work request.  We have canceled this work request.

## 2023-05-05 ENCOUNTER — Encounter: Admit: 2023-05-05 | Discharge: 2023-05-09 | Payer: PRIVATE HEALTH INSURANCE

## 2023-05-05 ENCOUNTER — Ambulatory Visit: Admit: 2023-05-05 | Discharge: 2023-05-09 | Payer: PRIVATE HEALTH INSURANCE

## 2023-05-05 MED ADMIN — lactated Ringers infusion: 10 mL/h | INTRAVENOUS | @ 14:00:00

## 2023-05-05 MED ADMIN — lidocaine (PF) (XYLOCAINE-MPF) 20 mg/mL (2 %) injection: INTRAVENOUS | @ 14:00:00 | Stop: 2023-05-05

## 2023-05-05 MED ADMIN — Propofol (DIPRIVAN) injection: INTRAVENOUS | @ 14:00:00 | Stop: 2023-05-05

## 2023-05-05 MED ADMIN — propofol (DIPRIVAN) infusion 10 mg/mL: INTRAVENOUS | @ 14:00:00 | Stop: 2023-05-05

## 2023-05-05 MED ADMIN — fentaNYL (PF) (SUBLIMAZE) injection: INTRAVENOUS | @ 14:00:00 | Stop: 2023-05-05

## 2023-05-06 ENCOUNTER — Telehealth: Admit: 2023-05-06 | Discharge: 2023-05-07 | Payer: PRIVATE HEALTH INSURANCE

## 2023-05-06 LAB — TACROLIMUS LEVEL: TACROLIMUS BLOOD: 12.5 ng/mL (ref 2.0–20.0)

## 2023-05-06 MED ORDER — TACROLIMUS 1 MG CAPSULE, IMMEDIATE-RELEASE
ORAL_CAPSULE | ORAL | 11 refills | 30 days | Status: CP
Start: 2023-05-06 — End: 2024-05-05

## 2023-05-06 NOTE — Unmapped (Addendum)
[facility] Pharmacist has reviewed a new prescription for tacrolimus that indicates a dose decrease.  Patient was counseled on this dosage change by [facility]- see epic note from 05/06/23.  Next refill call date adjusted if necessary.      Clinical Assessment Needed For: Dose Change  Medication: Tacrolimus 1mg  capsule  Last Fill Date/Day Supply: 04/07/2023 / 30 days  Copay $0  Was previous dose already scheduled to fill: No    Notes to Pharmacist: N/A

## 2023-05-06 NOTE — Unmapped (Signed)
[facility] CLINIC PHARMACY NOTE  Alicia Armstrong  [id number]    Medication changes today:   Decrease Tacrolimus to 3 mg AM and 2 mg PM  Stop letermovir next week, call Alicia Armstrong that day to get Tacrolimus adjustments    Education/Adherence tools provided today:  - Provided updated medication list  - Provided additional education on immunosuppression and transplant related medications including reviewing indications of medications, dosing and side effects  - Discussed adherence reminder tools such as cell phone alarms    Follow up items:  1. Goal of understanding indications and dosing of immunosuppression medications  2. Pentamidine scheduling  3. Update lipid panel, consider statin in the future  4. BG  5. BP    Next visit with pharmacy in 1-2 weeks  ____________________________________________________________________    Alicia Armstrong is a 47 y.o. 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     Immunologic Risk: first transplant    Other PMH significant for n/a    Induction: Methylpred    Post op course uncomplicated    Readmitted 7/6-7/9 with back pain and elevated bilirubin  Liver ultrasound showed concern for possible thrombosis or stenosis at common hepatic artery for which LMWH ppx 30 mg BID with plans for continuation for ~6 weeks.    7/15: Stop Humalog, hold MMF  7/15-7/17 admitted to [facility] from clinic with cytopenia, received dose of gCSF, 2 units PRBC, transitioned from valganciclovir to letermovir, enoxaparin increased to 40 mg BID  7/29: Restart mycophenolate 250 mg BID, start acyclovir 400 mg BID, stop calcium carbonate 500 mg TID  8/6-8/14 admission: start amlodipine 5 mg daily, Augmentin, carvedilol 6.25 mg BID, insulin glargine 16 units nightly, insulin lispro correction, oxycodone PRN  8/19: Increase MMF to 500 mg BID, decrease prednisone to 15 mg daily x7d then 10 mg daily indefinitely, decrease Humalog to 6 units TID AC, when prednisone dose reaches 10 mg will reduce Lantus to 20 units daily    Rejection History:  Admitted 8/6-8/14 for ACR treated with high dose steroids. 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  Infection History: NTD  ___________________________________________________________________    Last seen by pharmacy 2 weeks ago    Interval History:   - Biliary stent removed 05/05/23    Seen by pharmacy today for: medication management, blood glucose management and education, and pill box fill and adherence education    CC:  Patient has no complaints today    There were no vitals filed for this visit.      ___________________________________________________________________    Allergies   Allergen Reactions    Penicillins Other (See Comments)     Had a rash with penicillin when she was 47 years old. She tolerated piperacillin/tazobactam 08/25/2016-08/28/2016 with itching without rash. Tolerated Augmentin.       Medications reviewed in EPIC medication station and updated today by the clinical pharmacist practitioner.    Current Outpatient Medications   Medication Instructions    acetaminophen (TYLENOL) 325-650 mg, Oral, Every 8 hours PRN    acyclovir (ZOVIRAX) 400 mg, Oral, 2 times a day (standard)    amlodipine (NORVASC) 5 mg, Oral, Daily (standard)    aspirin (ECOTRIN) 81 mg, Oral, Daily (standard)    blood sugar diagnostic (ACCU-CHEK GUIDE TEST STRIPS) Strp Other, 4 times a day    carvedilol (COREG) 6.25 mg, Oral, 2 times a day (standard)    docusate sodium (COLACE) 100 mg, Oral, 2 times a day  PRN    gabapentin (NEURONTIN) 400 mg, Oral, 2 times a day    insulin glargine (BASAGLAR, LANTUS) 18 Units, Subcutaneous, Nightly    insulin lispro (HUMALOG) 4 Units, Subcutaneous, 3 times a day (AC), Take in addition to sliding scale. Max 50 units per day    lancets Misc Use to check blood sugar as directed with insulin 3 times a day & for symptoms of high or low blood sugar.    letermovir (PREVYMIS) 480 mg, Oral, Daily (standard) magnesium oxide-Mg AA chelate (MAGNESIUM, AMINO ACID CHELATE,) 133 mg 1 tablet, Oral, 2 times a day (standard)    methocarbamol (ROBAXIN) 500 mg, Oral, 4 times a day PRN    mycophenolate (CELLCEPT) 500 mg, Oral, 2 times a day (standard)    pantoprazole (PROTONIX) 40 mg, Oral, Daily (standard)    pen needle, diabetic 32 gauge x 5/32 (4 mm) Ndle Use with insulin up to 4 times/day as needed.    polyethylene glycol (MIRALAX) 17 gram packet Mix 1 packet in  4 to 8 ounces of liquid and drink daily as needed.    predniSONE (DELTASONE) 5 mg, Oral, Daily (standard)    SITagliptin phosphate (JANUVIA) 100 mg, Oral, Daily (standard)    tacrolimus (PROGRAF) 3 mg, Oral, 2 times a day     GRAFT FUNCTION: stable    Lab Results   Component Value Date    AST 14 05/03/2023    ALT 18 05/03/2023    Total Bilirubin 0.7 05/03/2023      Zero hour biopsy: negative for malignancy  Biopsies to date:  8/9: Minimal to mild portal inflammation, mild bile duct injury and focal endotheliitis, compatible with minimal acute cellular rejection, [place] score=3 (1+1+1)      Renal Function: stable    Lab Results   Component Value Date    Creatinine 0.82 05/03/2023    Creatinine 0.79 04/29/2023    Creatinine 0.78 04/27/2023    Creatinine 0.81 04/23/2023     Proteinuria/UPC:  none.  No results found for: PCRATIOUR      CURRENT IMMUNOSUPPRESSION:    Tacrolimus (Prograf) 3 mg BID  Tacrolimus Goal: 8 - 10   Mycophenolate mofetil (Cellcept) 500 mg BID    Prednisone 5 mg daily    IMMUNOSUPPRESSION DRUG LEVELS:  Lab Results   Component Value Date    Tacrolimus, Trough 16.7 (H) 03/10/2023    Tacrolimus, Trough 10.4 03/09/2023    Tacrolimus, Trough 10.7 02/23/2023    Tacrolimus Lvl 12.5 05/03/2023    Tacrolimus Lvl 9.5 04/29/2023    Tacrolimus Lvl 13.7 04/27/2023     Not drawn today    Patient is tolerating immunosuppression well    WBC/ANC:  wnl  Lab Results   Component Value Date    WBC 3.2 (L) 05/03/2023       Plan: Will decrease tacrolimus to 3 mg AM and 2 mg PM BID. Plan to call coordinator next week when taking last dose of letermovir to adjust Tacrolimus.      OI Prophylaxis:   CMV Status: D-/ R+, moderate risk.   CMV prophylaxis:  letermovir 480 mg  x 3 months per protocol (ending next week -- stopping a few days early due to cost) - valcyte stopped for neturopenia  No results found for: CMVCP    HSV ppx: acyclovir 400 mg BID (end 05/18/23)     PCP Prophylaxis: pentamidine 300 mg inhalation monthly x 6 months.    Thrush: completed in hospital    Patient  is  tolerating infectious prophylaxis well    Plan: Continue per protocol.      HAT ppx:  Meds currently on: aspirin 81 mg daily  LMWH - Completed  Plan: Continue to monitor    CAD prevention:  The ASCVD Risk score (Arnett DK, et al., 2019) failed to calculate for the following reasons:    Cannot find a previous HDL lab    Cannot find a previous total cholesterol lab    Note: For patients with SBP <90 or >200, Total Cholesterol <130 or >320, HDL <20 or >100 which are outside of the allowable range, the calculator will use these upper or lower values to calculate the patient???s risk score.     Statin therapy: Indicated; currently on no statin  Plan: Repeat lipid panel. Consider statin in the future. Continue to monitor       BP: Goal < 140/90. Clinic vitals reported above  Home BP ranges: 140/70 - 80    Current meds include: amlodipine 5 mg daily, carvedilol 6.25 mg BID  Plan: within goal. Continue to monitor    Anemia of CKD:  H/H:   Lab Results   Component Value Date    HGB 11.8 05/03/2023     Lab Results   Component Value Date    HCT 35.7 05/03/2023     Iron panel:  Lab Results   Component Value Date    IRON 51 03/22/2023    TIBC 229 (L) 03/22/2023    FERRITIN 396.4 (H) 03/08/2023     Lab Results   Component Value Date    Iron Saturation (%) 22 03/22/2023     Prior ESA use: none post txp    Plan: stable. Continue to monitor.     DM:   Lab Results   Component Value Date    A1C 4.6 (L) 02/14/2023   . Goal A1c < 7  History of Dm?  No - post txp hyperglycemia  Established with endocrinologist/PCP for BG managment? No  Currently on:   sitagliptin 100 mg daily  insulin glargine 18 units nightly  insulin lispro 4 units TID AC + correction  FBG: 118 - 144s  PPBG: 180 - 200s    Diet:3 meals per day + 2 protein shakes  Exercise:not yet  Hypoglycemia: yes x 1 (this week)  Plan: No change.  Continue to monitor      Electrolytes: wnl  Lab Results   Component Value Date    Potassium 4.3 05/03/2023    Potassium, Bld 3.8 09/07/2022    Sodium 142 05/03/2023    Sodium Whole Blood 131 (L) 09/07/2022    Magnesium 1.9 05/03/2023    CO2 25 05/03/2023       Meds currently on: magnesium plus protein 133 mg BID  Plan: No change.  Continue to monitor     GI/BM: pt reports constipation  Meds currently on: docusate PRN, Miralax PRN, pantoprazole 40 mg daily  Plan: No change.  Continue to monitor    Pain: pt reports no pain  Meds currently on: APAP PRN, methocarbamol 500 mg QID, gabapentin 400 mg BID   Plan: No change.  Continue to monitor.    Bone health:   Vitamin D Level: none available. Goal > 30.   No results found for: VITDTOT, VITDTOTAL    Lab Results   Component Value Date    Calcium 8.9 05/03/2023    Calcium 8.9 04/29/2023       Last DEXA results:   normal  in 2018  Current meds include: none  Plan: Vitamin D level  needs to be drawn with next lab schedule. Continue to monitor.     Women's/Men's Health:  Alicia Armstrong is a 47 y.o. Female perimenopausal. Patient reports no men's/women's health issues  Plan: Continue to monitor    Immunizations:  Influenza [Annual]: Received 2023    PPSV23: Received 08/2016  PCV20: Received 08/2022    Shingrix Zoster [2 doses, 2 - 6 months apart]: 1st dose given 08/2022 and 2nd dose due 10/2022    COVID-19 [3 primary doses, 2 boosters]: 1st dose given 09/22/22    Immunization status: up to date and documented.    Pharmacy preference:  [facility]  Medication Refills:  N/a  Medication Access:  N/a    Adherence: Patient has average understanding of medications; was not able to independently identify names/doses of immunosuppressants and OI meds.  Patient  does fill their own pill box on a regular basis at home.  Patient brought medication card:yes  Pill box:did not bring  Plan: provided moderate adherence counseling/intervention      During this visit, the following was completed:   BG log data assessment  BP log data assessment  Labs ordered and evaluated  complex treatment plan >1 DS   I spent a total of 15 minutes on the phone with the patient delivering clinical care and providing education/counseling.    All questions/concerns were addressed to the patient's satisfaction.  __________________________________________  Olivia Mackie, CPP, PharmD  Abdominal Transplant Clinical Pharmacist Practitioner      The patient reports they are physically located in [facility] and is currently: at home. I conducted a phone visit.  I spent 20 minutes on the phone call with the patient on the date of service .

## 2023-05-08 LAB — COMPREHENSIVE METABOLIC PANEL
ALBUMIN: 4.1 g/dL (ref 3.9–4.9)
ALKALINE PHOSPHATASE: 102 IU/L (ref 44–121)
ALT (SGPT): 13 IU/L (ref 0–32)
AST (SGOT): 12 IU/L (ref 0–40)
BILIRUBIN TOTAL (MG/DL) IN SER/PLAS: 0.6 mg/dL (ref 0.0–1.2)
BLOOD UREA NITROGEN: 21 mg/dL (ref 6–24)
BUN / CREAT RATIO: 26 — ABNORMAL HIGH (ref 9–23)
CALCIUM: 9 mg/dL (ref 8.7–10.2)
CHLORIDE: 104 mmol/L (ref 96–106)
CO2: 23 mmol/L (ref 20–29)
CREATININE: 0.81 mg/dL (ref 0.57–1.00)
EGFR: 91 mL/min/{1.73_m2}
GLOBULIN, TOTAL: 1.7 g/dL (ref 1.5–4.5)
GLUCOSE: 119 mg/dL — ABNORMAL HIGH (ref 70–99)
POTASSIUM: 4.5 mmol/L (ref 3.5–5.2)
SODIUM: 141 mmol/L (ref 134–144)
TOTAL PROTEIN: 5.8 g/dL — ABNORMAL LOW (ref 6.0–8.5)

## 2023-05-08 LAB — CBC W/ DIFFERENTIAL
BANDED NEUTROPHILS ABSOLUTE COUNT: 0 10*3/uL (ref 0.0–0.1)
BASOPHILS ABSOLUTE COUNT: 0 10*3/uL (ref 0.0–0.2)
BASOPHILS RELATIVE PERCENT: 0 %
EOSINOPHILS ABSOLUTE COUNT: 0.1 10*3/uL (ref 0.0–0.4)
EOSINOPHILS RELATIVE PERCENT: 2 %
HEMATOCRIT: 34.1 % (ref 34.0–46.6)
HEMOGLOBIN: 11.2 g/dL (ref 11.1–15.9)
IMMATURE GRANULOCYTES: 0 %
LYMPHOCYTES ABSOLUTE COUNT: 0.6 10*3/uL — ABNORMAL LOW (ref 0.7–3.1)
LYMPHOCYTES RELATIVE PERCENT: 19 %
MEAN CORPUSCULAR HEMOGLOBIN CONC: 32.8 g/dL (ref 31.5–35.7)
MEAN CORPUSCULAR HEMOGLOBIN: 28.6 pg (ref 26.6–33.0)
MEAN CORPUSCULAR VOLUME: 87 fL (ref 79–97)
MONOCYTES ABSOLUTE COUNT: 0.2 10*3/uL (ref 0.1–0.9)
MONOCYTES RELATIVE PERCENT: 8 %
NEUTROPHILS ABSOLUTE COUNT: 2.1 10*3/uL (ref 1.4–7.0)
NEUTROPHILS RELATIVE PERCENT: 71 %
PLATELET COUNT: 100 10*3/uL — CL (ref 150–450)
RED BLOOD CELL COUNT: 3.92 x10E6/uL (ref 3.77–5.28)
RED CELL DISTRIBUTION WIDTH: 15 % (ref 11.7–15.4)
WHITE BLOOD CELL COUNT: 3 10*3/uL — ABNORMAL LOW (ref 3.4–10.8)

## 2023-05-08 LAB — PHOSPHORUS: PHOSPHORUS, SERUM: 4 mg/dL (ref 3.0–4.3)

## 2023-05-08 LAB — MAGNESIUM: MAGNESIUM: 2 mg/dL (ref 1.6–2.3)

## 2023-05-08 LAB — GAMMA GT: GAMMA GLUTAMYL TRANSFERASE: 23 IU/L (ref 0–60)

## 2023-05-08 LAB — BILIRUBIN, DIRECT: BILIRUBIN DIRECT: 0.26 mg/dL (ref 0.00–0.40)

## 2023-05-10 LAB — TACROLIMUS LEVEL: TACROLIMUS BLOOD: 10.8 ng/mL (ref 2.0–20.0)

## 2023-05-11 LAB — CBC W/ DIFFERENTIAL
BANDED NEUTROPHILS ABSOLUTE COUNT: 0 10*3/uL (ref 0.0–0.1)
BASOPHILS ABSOLUTE COUNT: 0 10*3/uL (ref 0.0–0.2)
BASOPHILS RELATIVE PERCENT: 0 %
EOSINOPHILS ABSOLUTE COUNT: 0.1 10*3/uL (ref 0.0–0.4)
EOSINOPHILS RELATIVE PERCENT: 2 %
HEMATOCRIT: 33.8 % — ABNORMAL LOW (ref 34.0–46.6)
HEMOGLOBIN: 11.4 g/dL (ref 11.1–15.9)
IMMATURE GRANULOCYTES: 0 %
LYMPHOCYTES ABSOLUTE COUNT: 0.6 10*3/uL — ABNORMAL LOW (ref 0.7–3.1)
LYMPHOCYTES RELATIVE PERCENT: 20 %
MEAN CORPUSCULAR HEMOGLOBIN CONC: 33.7 g/dL (ref 31.5–35.7)
MEAN CORPUSCULAR HEMOGLOBIN: 29.4 pg (ref 26.6–33.0)
MEAN CORPUSCULAR VOLUME: 87 fL (ref 79–97)
MONOCYTES ABSOLUTE COUNT: 0.2 10*3/uL (ref 0.1–0.9)
MONOCYTES RELATIVE PERCENT: 7 %
NEUTROPHILS ABSOLUTE COUNT: 2.1 10*3/uL (ref 1.4–7.0)
NEUTROPHILS RELATIVE PERCENT: 71 %
PLATELET COUNT: 102 10*3/uL — ABNORMAL LOW (ref 150–450)
RED BLOOD CELL COUNT: 3.88 x10E6/uL (ref 3.77–5.28)
RED CELL DISTRIBUTION WIDTH: 14.5 % (ref 11.7–15.4)
WHITE BLOOD CELL COUNT: 3 10*3/uL — ABNORMAL LOW (ref 3.4–10.8)

## 2023-05-11 LAB — COMPREHENSIVE METABOLIC PANEL
ALBUMIN: 3.9 g/dL (ref 3.9–4.9)
ALKALINE PHOSPHATASE: 102 IU/L (ref 44–121)
ALT (SGPT): 15 IU/L (ref 0–32)
AST (SGOT): 11 IU/L (ref 0–40)
BILIRUBIN TOTAL (MG/DL) IN SER/PLAS: 0.5 mg/dL (ref 0.0–1.2)
BLOOD UREA NITROGEN: 17 mg/dL (ref 6–24)
BUN / CREAT RATIO: 22 (ref 9–23)
CALCIUM: 8.7 mg/dL (ref 8.7–10.2)
CHLORIDE: 105 mmol/L (ref 96–106)
CO2: 23 mmol/L (ref 20–29)
CREATININE: 0.79 mg/dL (ref 0.57–1.00)
EGFR: 93 mL/min/{1.73_m2}
GLOBULIN, TOTAL: 1.7 g/dL (ref 1.5–4.5)
GLUCOSE: 129 mg/dL — ABNORMAL HIGH (ref 70–99)
POTASSIUM: 4.4 mmol/L (ref 3.5–5.2)
SODIUM: 142 mmol/L (ref 134–144)
TOTAL PROTEIN: 5.6 g/dL — ABNORMAL LOW (ref 6.0–8.5)

## 2023-05-11 LAB — MAGNESIUM: MAGNESIUM: 1.8 mg/dL (ref 1.6–2.3)

## 2023-05-11 LAB — GAMMA GT: GAMMA GLUTAMYL TRANSFERASE: 20 IU/L (ref 0–60)

## 2023-05-11 LAB — BILIRUBIN, DIRECT: BILIRUBIN DIRECT: 0.24 mg/dL (ref 0.00–0.40)

## 2023-05-11 LAB — PHOSPHORUS: PHOSPHORUS, SERUM: 3.8 mg/dL (ref 3.0–4.3)

## 2023-05-11 NOTE — Unmapped (Addendum)
Called pt to discuss her appt changes from 9/23 to 9/24 due to clinic changes. She said she did not need an interpreter. Discussed in detail the new date and times and she said she uses mychart. Then rescheduled virtual nutrition appt from 9/24 to 9/25. Pt said she prefers iphone video for virtuals. Instructed her to call this tpa re any scheduling questions, and she wrote down this tpa's name and number. She verbalized understanding of all discussed.

## 2023-05-12 LAB — TACROLIMUS LEVEL: TACROLIMUS BLOOD: 11.4 ng/mL (ref 2.0–20.0)

## 2023-05-12 NOTE — Unmapped (Signed)
TRANSPLANT SURGERY PROGRESS NOTE        ASSESSMENT AND PLAN   Alicia Armstrong is a 47 y.o. 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      Graft Function and Immunosuppression:  - Liver enzymes completely WNL  - Biopsy completed  04/02/23- Liver allograft, biopsy (1 month and 2 weeks post transplantation):   -Minimal to mild portal inflammation, mild bile duct injury and focal endotheliitis, compatible with minimal acute cellular rejection, Banff score=3 (1+1+1).  - HLA status: No DSAs to date  - Tacro dose: 3/2, Goal: 8-10, decrease to 2mg  BID with new goal of 6-8 as is now 3mos post txp  - Cellcept Dose: 500mg  BID (leukopenia)  - Prednisone Dose: 5mg  daily  - Pantoprazole 40mg  daily for stress ulcer ppx  - ASA 81mg  daily for CV ppx    Antimicrobial Prophylaxis:  - On acyclovir for HSV ppx  - moderate risk, letermovir 480 mg  x 3 months per protocol (end 05/18/23), valcyte caused neutropenia  - PCP ppx: pentamadine x6 mos    Blood Pressure/Fluid Management:  - BPs running 100s/70s on amlodipine 5 mg daily, carvedilol 6.25 mg BID   - UOP is great  - Cr WNL    Heme:  - Liver u/s initially with concern for possible thrombus or stenosis at common hepatic artery  - Off lovenox as of 04/07/23    Thrombocytopenia  - Stable since txp  - splenomegaly     HISTORY:   HPI:    - Feeling well overall  - Oral intake 80 oz (5 bottles of water)  - UOP is good  - Denies LE edema  - Home BPs 100s/70s  - Denies cough, SOB, sputum production, fevers, chills  - abdominal incision is healing well  - reflux on protonix is well controlled, has had some issues with fatty foods  - Blood sugars 140s-134 fasting, 120s-130s postprandial  - Had one low sugar to 47 approx 2 weeks ago, she can feel symptoms  - Exercising walking daily near her house.    ROS:   - the balance of 10 systems is negative other than noted above     Past Medical History  Past Medical History:   Diagnosis Date    Cirrhosis (CMS-HCC)     History of transfusion        Past Surgical History  Past Surgical History:   Procedure Laterality Date    CESAREAN SECTION      CHG X-RAY FOR BILE DUCT ENDOSCOPY  04/06/2023    Procedure: ENDOSCOPIC CATHETERIZATION OF THE BILIARY DUCTAL SYSTEM, RADIOLOGICAL SUPERVISION AND INTERPRETATION;  Surgeon: Jules Husbands, MD;  [facility]: [facility];  Service: Gastroenterology    PR COLSC FLX W/RMVL OF TUMOR POLYP LESION SNARE TQ N/A 09/11/2022    Procedure: COLONOSCOPY FLEX; W/REMOV TUMOR/LES BY SNARE;  Surgeon: Mickie Hillier, MD;  Location: [facility];  Service: Gastroenterology    PR EGD FLEXIBLE FOREIGN BODY REMOVAL N/A 05/05/2023    Procedure: UGI ENDOSCOPY; W/REMOVAL FOREIGN BODY;  Surgeon: Jules Husbands, MD;  Location: [facility];  Service: Gastroenterology    PR ERCP STENT PLACEMENT BILIARY/PANCREATIC DUCT N/A 04/06/2023    Procedure: ENDOSCOPIC RETROGRADE CHOLANGIOPANCREATOGRAPHY (ERCP); WITH PLACEMENT OF ENDOSCOPIC STENT  INTO BILIARY OR PANCREATIC DUCT;  Surgeon: Jules Husbands, MD;  Location: [facility];  Service: Gastroenterology    PR ERCP,SPHINCTEROTOMY N/A 04/06/2023    Procedure: ERCP; W/SPHINCTEROTOMY/PAPILLOTOMY;  Surgeon: Jules Husbands, MD;  Location: [facility];  Service: Gastroenterology    PR REMV VEIN CLOT CAVA-ILIAC,LEG INCIS N/A 02/14/2023    Procedure: [facility] DIRECT OR W/CATH; VENA CAVA BY LEG INCS;  Surgeon: Florene Glen, MD;  Location: [facility];  Service: Transplant    PR TRANSPLANT LIVER,ALLOTRANSPLANT Bilateral 02/14/2023    Procedure: LIVER ALLOTRANSPLANTATION; ORTHOTOPIC, PARTIAL OR WHOLE, FROM CADAVER OR LIVING DONOR, ANY AGE;  Surgeon: Florene Glen, MD;  Location: MAIN OR [place];  Service: Transplant    PR TRANSPLANT,PREP DONOR LIVER, WHOLE N/A 02/14/2023    Procedure: BACKBNCH STD PREP CAD DONOR WHOLE LIVER GFT PRIOR TNSPLNT,INC CHOLE,DISS/REM SURR TISSU WO TRISEG/LOBE SPLT;  Surgeon: Florene Glen, MD;  Location: MAIN OR [place];  Service: Transplant    PR UPPER GI ENDOSCOPY,DIAGNOSIS N/A 04/02/2017    Procedure: UGI ENDO, INCLUDE ESOPHAGUS, STOMACH, & DUODENUM &/OR JEJUNUM; DX W/WO COLLECTION SPECIMN, BY BRUSH OR WASH;  Surgeon: Janyth Pupa, MD;  Location: [facility];  Service: Gastroenterology    PR UPPER GI ENDOSCOPY,DIAGNOSIS N/A 09/11/2022    Procedure: UGI ENDO, INCLUDE ESOPHAGUS, STOMACH, & DUODENUM &/OR JEJUNUM; DX W/WO COLLECTION SPECIMN, BY BRUSH OR WASH;  Surgeon: Mickie Hillier, MD;  Location: [facility];  Service: Gastroenterology     - (Liver txp indication) s/p OLT  --> Induction:  methylprednisone  --> CMV: D- / R+  --> HLA status: No DSAs to date    Allergies  Penicillins    Medications    Current Outpatient Medications   Medication Sig Dispense Refill    acetaminophen (TYLENOL) 325 MG tablet Take 1-2 tablets (325-650 mg total) by mouth every eight (8) hours as needed for pain or fever. 60 tablet 11    acyclovir (ZOVIRAX) 400 MG tablet Take 1 tablet (400 mg total) by mouth two (2) times a day. 60 tablet 1    amlodipine (NORVASC) 5 MG tablet Take 1 tablet (5 mg total) by mouth daily. 30 tablet 11    aspirin (ECOTRIN) 81 MG tablet Take 1 tablet (81 mg total) by mouth daily. 30 tablet 11    blood sugar diagnostic (ACCU-CHEK GUIDE TEST STRIPS) Strp by Other route four (4) times a day. 100 strip 11    carvedilol (COREG) 6.25 MG tablet Take 1 tablet (6.25 mg total) by mouth two (2) times a day. 60 tablet 11    docusate sodium (COLACE) 100 MG capsule Take 1 capsule (100 mg total) by mouth two (2) times a day as needed for constipation. 60 capsule 11    gabapentin (NEURONTIN) 400 MG capsule Take 1 capsule (400 mg total) by mouth two (2) times a day. 90 capsule 11    insulin glargine (BASAGLAR, LANTUS) 100 unit/mL (3 mL) injection pen Inject 0.18 mL (18 Units total) under the skin nightly. 15 mL 0    insulin lispro (HUMALOG) 100 unit/mL injection pen Inject 4 Units under the skin Three (3) times a day before meals. Take in addition to sliding scale. Max 50 units per day 15 mL 11    lancets Misc Use to check blood sugar as directed with insulin 3 times a day & for symptoms of high or low blood sugar. 100 each 11    letermovir (PREVYMIS) 480  mg tablet Take 1 tablet (480 mg total) by mouth daily. 28 tablet 1    magnesium oxide-Mg AA chelate (MAGNESIUM, AMINO ACID CHELATE,) 133 mg Take 1 tablet by mouth two (2) times a day. 100 tablet 6    methocarbamol (ROBAXIN) 500 MG tablet Take 1 tablet (500 mg total) by mouth four (4) times a day as needed. 120 tablet 1    mycophenolate (CELLCEPT) 250 mg capsule Take 2 capsules (500 mg total) by mouth two (2) times a day. 120 capsule 11    pantoprazole (PROTONIX) 40 MG tablet Take 1 tablet (40 mg total) by mouth daily. 30 tablet 11    pen needle, diabetic 32 gauge x 5/32 (4 mm) Ndle Use with insulin up to 4 times/day as needed. 100 each 11    polyethylene glycol (MIRALAX) 17 gram packet Mix 1 packet in  4 to 8 ounces of liquid and drink daily as needed. 30 packet 11    predniSONE (DELTASONE) 5 MG tablet Take 1 tablet (5 mg total) by mouth daily. 30 tablet 5    SITagliptin phosphate (JANUVIA) 100 MG tablet Take 1 tablet (100 mg total) by mouth daily. 30 tablet 11    tacrolimus (PROGRAF) 1 MG capsule Take 3 capsules (3 mg total) by mouth daily AND 2 capsules (2 mg total) nightly. 150 capsule 11     No current facility-administered medications for this visit.       Family History  The patient's family history includes Diabetes in her father; No Known Problems in her daughter, maternal grandfather, maternal grandmother, mother, paternal grandfather, paternal grandmother, and sister..    Social History:  Tobacco use: denies  Alcohol use: denies  Drug use: denies    OBJECTIVE DATA:   Physical Exam:    Vitals:    05/18/23 1300   BP: 110/73   Pulse: 74   Temp: 36.7 ??C (98 ??F)   TempSrc: Tympanic   Weight: 64.6 kg (142 lb 8 oz)   Height: 140 cm (4' 7.12)      Body mass index is 32.98 kg/m??.     General: Well appearing, NAD   HEENT: No adenopathy  Lungs: clear to auscultation, percussion to the bases, and unlabored breathing  Heart: euvolemic, regular rate and rhythm, normal S1 and S2, no murmur  Abd: soft, non-distended, non-tender, no organomegaly or masses  Skin: no rashes, jaundice or skin lesions noted  Ext: no edema, well perfused  Neuro: non-focal exam  Psych: thought organized, appropriate affect, normal fluent speech      Test Results    Labs:  Lab Results   Component Value Date    WBC 3.2 (L) 05/14/2023    HGB 11.2 05/14/2023    HCT 34.1 05/14/2023    PLT 94 (LL) 05/14/2023       Lab Results   Component Value Date    NA 143 05/14/2023    K 4.1 05/14/2023    CL 106 05/14/2023    CO2 24 05/14/2023    BUN 20 05/14/2023    CREATININE 0.91 05/14/2023    GLU 163 (H) 04/12/2023    CALCIUM 8.5 (L) 05/14/2023    MG 1.8 05/14/2023    PHOS 3.7 04/12/2023       Lab Results   Component Value Date    BILITOT 0.5 05/14/2023    BILIDIR 0.21 05/14/2023    PROT 5.7 (L) 05/14/2023    ALBUMIN 3.7 04/12/2023    ALT 12 05/14/2023  AST 12 05/14/2023    ALKPHOS 97 05/14/2023    GGT 17 05/14/2023       Lab Results   Component Value Date    PT 12.0 03/30/2023    INR 1.07 03/30/2023    APTT 31.8 03/30/2023         Imaging:     MRI:  Impression   --Orthotopic liver transplant with loculated collection in the gallbladder fossa which may reflect biloma.   --Focal irregularity at the common bile duct anastomosis with poor visualization of the duct at this location. This could be due to susceptibility from surgical material and/or motion. The donor bile duct upstream of the anastomosis measures 0.9 cm with the recipient common bile duct measuring 0.6 cm downstream.   --Motion artifact limits assessment for possible bile leak location. No definite leak identified. If drainage output indicates bile leak, donor cystic duct and left hepatic bile duct could be interrogated using ERCP. If ERCP cannot be performed, Eovist enhanced bile leak protocol MRCP could be performed though it would likely also suffer from motion artifact.   --Splenomegaly with perisplenic varices.         Health Maintenance:    - Last derm visit:  Last year   - Last GYN visit:  Recommended  - Last Colonsocopy:   08/2022 polyps   - Last Dexa:   10/2016 normal  - Last vitamin D:  None on file  - Last HbA1c:   4.6 (02/14/2023)  - Hep A:   Immune  - Hep B:   X5, 10/2022  - Zoster:   10/2022  - Pneumococcal  05/2023  - SARS-CoV-2    08/2022, will give today 05/18/23  - Flu    will give today 05/18/23    Immunization History   Administered Date(s) Administered    Covid-19 Vac, (76yr+) (Spikevax) Monovalent Moderna 09/22/2022    HEPATITIS B VACCINE ADULT, ADJUVANTED, IM(HEPLISAV B) 11/20/2022    HEPATITIS B VACCINE ADULT,IM(ENERGIX B, RECOMBIVAX) 12/15/2016    Hepatitis B Vaccine, Dialysis 12/15/2016    Hepatitis B Vaccine, Unspecified Formulation 12/15/2016    Hepatitis B vaccine, pediatric/adolescent dosage, 11/07/2014, 12/10/2014, 08/05/2015    INFLUENZA TIV (TRI) 32MO+ W/ PRESERV (IM) 06/20/2015, 09/08/2016, 06/01/2017    Influenza Vaccine Quad(IM)6 MO-Adult(PF) 06/20/2015, 09/08/2016, 06/01/2017, 05/09/2019, 07/02/2020, 06/25/2021, 05/18/2022    Influenza Virus Vaccine, unspecified formulation 06/20/2015, 09/08/2016, 06/01/2017    PNEUMOCOCCAL POLYSACCHARIDE 23-VALENT 09/01/2016    Pneumococcal Conjugate 20-valent 09/22/2022    SHINGRIX-ZOSTER VACCINE (HZV),RECOMBINANT,ADJUVANTED(IM) 09/22/2022, 11/20/2022    TdaP 09/08/2016, 01/15/2023         >19min was spent with the patient face to face and >54min was spent reviewing chart/imaging.     Complex medical decision making was done as we adjust medications based on blood work/drug levels and multiple complaints and problems were addressed    The patient was assessed and discussed with Dr. Laurene Footman who is in agreement with plan.    Orie Fisherman MSN, FNP-C  May 18, 2023 2:57 PM  Lung Transplant Nurse Practitioner  Pager [phone]

## 2023-05-12 NOTE — Unmapped (Signed)
Patient's 9/16 Tac level at 11.4. Spoke with patient, who confirmed she has been taking the 3mg /2mg  dose prescribed and that the Tac level was reliable. She has only tomorrow's dose left of letermovir. Discussed with PharmD [name], who recommended no dose adjustment, since her level will fall naturally after she takes her last letermovir tomorrow. Spoke with patient and relayed the rationale for no change at this time. She verbalized understanding. Patient will begin getting labs twice weekly starting next week. She was aware of appt changes to Tuesday.

## 2023-05-13 LAB — COMPREHENSIVE METABOLIC PANEL
ALBUMIN: 4.2 g/dL (ref 3.9–4.9)
ALKALINE PHOSPHATASE: 100 IU/L (ref 44–121)
ALT (SGPT): 13 IU/L (ref 0–32)
AST (SGOT): 12 IU/L (ref 0–40)
BILIRUBIN TOTAL (MG/DL) IN SER/PLAS: 0.6 mg/dL (ref 0.0–1.2)
BLOOD UREA NITROGEN: 12 mg/dL (ref 6–24)
BUN / CREAT RATIO: 15 (ref 9–23)
CALCIUM: 8.8 mg/dL (ref 8.7–10.2)
CHLORIDE: 103 mmol/L (ref 96–106)
CO2: 25 mmol/L (ref 20–29)
CREATININE: 0.8 mg/dL (ref 0.57–1.00)
EGFR: 92 mL/min/{1.73_m2}
GLOBULIN, TOTAL: 1.7 g/dL (ref 1.5–4.5)
GLUCOSE: 128 mg/dL — ABNORMAL HIGH (ref 70–99)
POTASSIUM: 4.6 mmol/L (ref 3.5–5.2)
SODIUM: 142 mmol/L (ref 134–144)
TOTAL PROTEIN: 5.9 g/dL — ABNORMAL LOW (ref 6.0–8.5)

## 2023-05-13 LAB — GAMMA GT: GAMMA GLUTAMYL TRANSFERASE: 20 IU/L (ref 0–60)

## 2023-05-13 LAB — BILIRUBIN, DIRECT: BILIRUBIN DIRECT: 0.25 mg/dL (ref 0.00–0.40)

## 2023-05-13 LAB — PHOSPHORUS: PHOSPHORUS, SERUM: 3.6 mg/dL (ref 3.0–4.3)

## 2023-05-13 LAB — MAGNESIUM: MAGNESIUM: 1.7 mg/dL (ref 1.6–2.3)

## 2023-05-14 LAB — CBC W/ DIFFERENTIAL
BANDED NEUTROPHILS ABSOLUTE COUNT: 0 10*3/uL (ref 0.0–0.1)
BASOPHILS ABSOLUTE COUNT: 0 10*3/uL (ref 0.0–0.2)
BASOPHILS RELATIVE PERCENT: 0 %
EOSINOPHILS ABSOLUTE COUNT: 0.1 10*3/uL (ref 0.0–0.4)
EOSINOPHILS RELATIVE PERCENT: 2 %
HEMATOCRIT: 34.7 % (ref 34.0–46.6)
HEMOGLOBIN: 11.5 g/dL (ref 11.1–15.9)
IMMATURE GRANULOCYTES: 0 %
LYMPHOCYTES ABSOLUTE COUNT: 0.5 10*3/uL — ABNORMAL LOW (ref 0.7–3.1)
LYMPHOCYTES RELATIVE PERCENT: 17 %
MEAN CORPUSCULAR HEMOGLOBIN CONC: 33.1 g/dL (ref 31.5–35.7)
MEAN CORPUSCULAR HEMOGLOBIN: 28.7 pg (ref 26.6–33.0)
MEAN CORPUSCULAR VOLUME: 87 fL (ref 79–97)
MONOCYTES ABSOLUTE COUNT: 0.3 10*3/uL (ref 0.1–0.9)
MONOCYTES RELATIVE PERCENT: 8 %
NEUTROPHILS ABSOLUTE COUNT: 2.3 10*3/uL (ref 1.4–7.0)
NEUTROPHILS RELATIVE PERCENT: 73 %
PLATELET COUNT: 99 10*3/uL — CL (ref 150–450)
RED BLOOD CELL COUNT: 4.01 x10E6/uL (ref 3.77–5.28)
RED CELL DISTRIBUTION WIDTH: 13.8 % (ref 11.7–15.4)
WHITE BLOOD CELL COUNT: 3.1 10*3/uL — ABNORMAL LOW (ref 3.4–10.8)

## 2023-05-15 LAB — COMPREHENSIVE METABOLIC PANEL
ALBUMIN: 4.1 g/dL (ref 3.9–4.9)
ALKALINE PHOSPHATASE: 97 IU/L (ref 44–121)
ALT (SGPT): 12 IU/L (ref 0–32)
AST (SGOT): 12 IU/L (ref 0–40)
BILIRUBIN TOTAL (MG/DL) IN SER/PLAS: 0.5 mg/dL (ref 0.0–1.2)
BLOOD UREA NITROGEN: 20 mg/dL (ref 6–24)
BUN / CREAT RATIO: 22 (ref 9–23)
CALCIUM: 8.5 mg/dL — ABNORMAL LOW (ref 8.7–10.2)
CHLORIDE: 106 mmol/L (ref 96–106)
CO2: 24 mmol/L (ref 20–29)
CREATININE: 0.91 mg/dL (ref 0.57–1.00)
EGFR: 79 mL/min/{1.73_m2}
GLOBULIN, TOTAL: 1.6 g/dL (ref 1.5–4.5)
GLUCOSE: 69 mg/dL — ABNORMAL LOW (ref 70–99)
POTASSIUM: 4.1 mmol/L (ref 3.5–5.2)
SODIUM: 143 mmol/L (ref 134–144)
TOTAL PROTEIN: 5.7 g/dL — ABNORMAL LOW (ref 6.0–8.5)

## 2023-05-15 LAB — BILIRUBIN, DIRECT: BILIRUBIN DIRECT: 0.21 mg/dL (ref 0.00–0.40)

## 2023-05-15 LAB — PHOSPHORUS: PHOSPHORUS, SERUM: 3.6 mg/dL (ref 3.0–4.3)

## 2023-05-15 LAB — TACROLIMUS LEVEL: TACROLIMUS BLOOD: 8.2 ng/mL (ref 2.0–20.0)

## 2023-05-15 LAB — MAGNESIUM: MAGNESIUM: 1.8 mg/dL (ref 1.6–2.3)

## 2023-05-15 LAB — GAMMA GT: GAMMA GLUTAMYL TRANSFERASE: 17 IU/L (ref 0–60)

## 2023-05-17 LAB — CBC W/ DIFFERENTIAL
BANDED NEUTROPHILS ABSOLUTE COUNT: 0 10*3/uL (ref 0.0–0.1)
BASOPHILS ABSOLUTE COUNT: 0 10*3/uL (ref 0.0–0.2)
BASOPHILS RELATIVE PERCENT: 0 %
EOSINOPHILS ABSOLUTE COUNT: 0.1 10*3/uL (ref 0.0–0.4)
EOSINOPHILS RELATIVE PERCENT: 2 %
HEMATOCRIT: 34.1 % (ref 34.0–46.6)
HEMOGLOBIN: 11.2 g/dL (ref 11.1–15.9)
IMMATURE GRANULOCYTES: 0 %
LYMPHOCYTES ABSOLUTE COUNT: 0.7 10*3/uL (ref 0.7–3.1)
LYMPHOCYTES RELATIVE PERCENT: 22 %
MEAN CORPUSCULAR HEMOGLOBIN CONC: 32.8 g/dL (ref 31.5–35.7)
MEAN CORPUSCULAR HEMOGLOBIN: 28.9 pg (ref 26.6–33.0)
MEAN CORPUSCULAR VOLUME: 88 fL (ref 79–97)
MONOCYTES ABSOLUTE COUNT: 0.3 10*3/uL (ref 0.1–0.9)
MONOCYTES RELATIVE PERCENT: 8 %
NEUTROPHILS ABSOLUTE COUNT: 2.2 10*3/uL (ref 1.4–7.0)
NEUTROPHILS RELATIVE PERCENT: 68 %
PLATELET COUNT: 94 10*3/uL — CL (ref 150–450)
RED BLOOD CELL COUNT: 3.88 x10E6/uL (ref 3.77–5.28)
RED CELL DISTRIBUTION WIDTH: 14.2 % (ref 11.7–15.4)
WHITE BLOOD CELL COUNT: 3.2 10*3/uL — ABNORMAL LOW (ref 3.4–10.8)

## 2023-05-17 LAB — TACROLIMUS LEVEL: TACROLIMUS BLOOD: 9.9 ng/mL (ref 2.0–20.0)

## 2023-05-17 NOTE — Unmapped (Signed)
Pt called to see if pentamadine appt sched for 9/26 can be moved to tomorrow when she has 2 appts so that she doesn't have to make 2 trips in 3 days. Told her this tpa will check and call her back. She verbalized understanding.     Through secure chat pharmacist said that would be ok.    Called pt back and told her pharmacist said that is ok. Went over schedule in detail. She asked if she will have lab appt at [facility] before other appts. Asked her what time she takes her meds and she said 9 am. Told her that since her appts are in the afternoon, that would be too late. She then said she will get them locally tomorrow at 8 am, and verbalized understanding of all discussed.

## 2023-05-18 ENCOUNTER — Institutional Professional Consult (permissible substitution): Admit: 2023-05-18 | Discharge: 2023-05-18 | Payer: PRIVATE HEALTH INSURANCE

## 2023-05-18 ENCOUNTER — Ambulatory Visit: Admit: 2023-05-18 | Discharge: 2023-05-18 | Payer: PRIVATE HEALTH INSURANCE | Attending: Family | Primary: Family

## 2023-05-18 DIAGNOSIS — E119 Type 2 diabetes mellitus without complications: Principal | ICD-10-CM

## 2023-05-18 DIAGNOSIS — D702 Other drug-induced agranulocytosis: Principal | ICD-10-CM

## 2023-05-18 DIAGNOSIS — Z944 Liver transplant status: Principal | ICD-10-CM

## 2023-05-18 DIAGNOSIS — Z796 Long-term use of immunosuppressant medication: Principal | ICD-10-CM

## 2023-05-18 MED ORDER — INSULIN GLARGINE (U-100) 100 UNIT/ML (3 ML) SUBCUTANEOUS PEN
Freq: Every evening | SUBCUTANEOUS | 3 refills | 83 days | Status: CP
Start: 2023-05-18 — End: 2024-05-17
  Filled 2023-05-20: qty 15, 50d supply, fill #0

## 2023-05-18 MED ORDER — TACROLIMUS 1 MG CAPSULE, IMMEDIATE-RELEASE
ORAL_CAPSULE | Freq: Two times a day (BID) | ORAL | 11 refills | 30 days | Status: CP
Start: 2023-05-18 — End: 2024-05-17

## 2023-05-18 MED ADMIN — pentamidine (PENTAM) inhalation solution: 300 mg | RESPIRATORY_TRACT | @ 19:00:00 | Stop: 2023-05-18

## 2023-05-18 MED ADMIN — albuterol 2.5 mg /3 mL (0.083 %) nebulizer solution 2.5 mg: 2.5 mg | RESPIRATORY_TRACT | @ 19:00:00 | Stop: 2023-05-18

## 2023-05-18 NOTE — Unmapped (Signed)
Please see patient pharmacy visit for documentation.    Per provider, the patient received the covid and flu vaccine.  Patient ID verified with name and date of birth.  All screening questions were answered.  Vaccine(s) were administered as ordered.  See immunization history for documentation.  Patient tolerated the injection(s) well with no issues noted.  Vaccine Information sheet given to the patient.

## 2023-05-18 NOTE — Unmapped (Signed)
Per providers orders, Albuterol and Pentamidine treatments were administered.  Patient tolerated it well with no complication.  See MAR for administration info.

## 2023-05-18 NOTE — Unmapped (Signed)
[facility] CLINIC PHARMACY NOTE  Alicia Armstrong  [id number]    Medication changes today:   Decrease tacrolimus to 2 mg BID, goal 6-8  Stop acyclovir  Pentamidine given 9/24    Education/Adherence tools provided today:  - Provided updated medication list  - Provided additional education on immunosuppression and transplant related medications including reviewing indications of medications, dosing and side effects  - Discussed adherence reminder tools such as cell phone alarms    Follow up items:  1. Goal of understanding indications and dosing of immunosuppression medications  2. Pentamidine scheduling  3. Update lipid panel, consider statin in the future  4. BG, A1c at next visit  5. BP    Next visit with pharmacy in 1-3 months  ____________________________________________________________________    Alicia Armstrong is a 47 y.o. 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     Immunologic Risk: first transplant    Other PMH significant for n/a    Induction: Methylpred    Post op course uncomplicated    Readmitted 7/6-7/9 with back pain and elevated bilirubin  Liver ultrasound showed concern for possible thrombosis or stenosis at common hepatic artery for which LMWH ppx 30 mg BID with plans for continuation for ~6 weeks.    7/15: Stop Humalog, hold MMF  7/15-7/17 admitted to [facility] from clinic with cytopenia, received dose of gCSF, 2 units PRBC, transitioned from valganciclovir to letermovir, enoxaparin increased to 40 mg BID  7/29: Restart mycophenolate 250 mg BID, start acyclovir 400 mg BID, stop calcium carbonate 500 mg TID  8/6-8/14 admission: start amlodipine 5 mg daily, Augmentin, carvedilol 6.25 mg BID, insulin glargine 16 units nightly, insulin lispro correction, oxycodone PRN  8/19: Increase MMF to 500 mg BID, decrease prednisone to 15 mg daily x7d then 10 mg daily indefinitely, decrease Humalog to 6 units TID AC, when prednisone dose reaches 10 mg will reduce Lantus to 20 units daily  9/12: Pt reported x1 hypoglycemia, changed insulin to glargine 18 units, lispro 4 units TIDAC + correction.    Rejection History:  Admitted 8/6-8/14 for ACR treated with high dose steroids. 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  Infection History: NTD  ___________________________________________________________________    Last seen by pharmacy 2 weeks ago    Interval History:   - Biliary stent removed 05/05/23    Seen by pharmacy today for: medication management, blood glucose management and education, and pill box fill and adherence education    CC:  Patient has no complaints today    Pt reports constipation - using Miralax BID and docusate BID to have regular bowel movements.    Vitals:    05/18/23 1308   BP: 110/73   Pulse: 74   Temp: 36.7 ??C (98.1 ??F)     _________________________________________________________________    Allergies   Allergen Reactions    Penicillins Other (See Comments)     Had a rash with penicillin when she was 47 years old. She tolerated piperacillin/tazobactam 08/25/2016-08/28/2016 with itching without rash. Tolerated Augmentin.       Medications reviewed in EPIC medication station and updated today by the clinical pharmacist practitioner.    Current Outpatient Medications   Medication Instructions    acetaminophen (TYLENOL) 325-650 mg, Oral, Every 8 hours PRN    acyclovir (ZOVIRAX) 400 mg, Oral, 2 times a day (standard)    amlodipine (NORVASC) 5 mg, Oral, Daily (standard)    aspirin (ECOTRIN) 81 mg, Oral, Daily (  standard)    blood sugar diagnostic (ACCU-CHEK GUIDE TEST STRIPS) Strp Other, 4 times a day    carvedilol (COREG) 6.25 mg, Oral, 2 times a day (standard)    docusate sodium (COLACE) 100 mg, Oral, 2 times a day PRN    gabapentin (NEURONTIN) 400 mg, Oral, 2 times a day    insulin glargine (BASAGLAR, LANTUS) 18 Units, Subcutaneous, Nightly    insulin lispro (HUMALOG) 4 Units, Subcutaneous, 3 times a day (AC), Take in addition to sliding scale. Max 50 units per day    lancets Misc Use to check blood sugar as directed with insulin 3 times a day & for symptoms of high or low blood sugar.    letermovir (PREVYMIS) 480 mg, Oral, Daily (standard)    magnesium oxide-Mg AA chelate (MAGNESIUM, AMINO ACID CHELATE,) 133 mg 1 tablet, Oral, 2 times a day (standard)    methocarbamol (ROBAXIN) 500 mg, Oral, 4 times a day PRN    mycophenolate (CELLCEPT) 500 mg, Oral, 2 times a day (standard)    pantoprazole (PROTONIX) 40 mg, Oral, Daily (standard)    pen needle, diabetic 32 gauge x 5/32 (4 mm) Ndle Use with insulin up to 4 times/day as needed.    polyethylene glycol (MIRALAX) 17 gram packet Mix 1 packet in  4 to 8 ounces of liquid and drink daily as needed.    predniSONE (DELTASONE) 5 mg, Oral, Daily (standard)    SITagliptin phosphate (JANUVIA) 100 mg, Oral, Daily (standard)    tacrolimus (PROGRAF) 1 MG capsule Take 3 capsules (3 mg total) by mouth daily AND 2 capsules (2 mg total) nightly.     GRAFT FUNCTION: stable    Lab Results   Component Value Date    AST 12 05/14/2023    ALT 12 05/14/2023    Total Bilirubin 0.5 05/14/2023      Zero hour biopsy: negative for malignancy  Biopsies to date:  8/9: Minimal to mild portal inflammation, mild bile duct injury and focal endotheliitis, compatible with minimal acute cellular rejection, [place] score=3 (1+1+1)      Renal Function: stable    Lab Results   Component Value Date    Creatinine 0.91 05/14/2023    Creatinine 0.80 05/12/2023    Creatinine 0.79 05/10/2023    Creatinine 0.81 05/07/2023     Proteinuria/UPC:  none.  No results found for: PCRATIOUR      CURRENT IMMUNOSUPPRESSION:    Tacrolimus (Prograf) 3 mg every morning + 2 mg every evening    Tacrolimus Goal: 8 - 10   Mycophenolate mofetil (Cellcept) 500 mg BID    Prednisone 5 mg daily    IMMUNOSUPPRESSION DRUG LEVELS:  Lab Results   Component Value Date    Tacrolimus, Trough 16.7 (H) 03/10/2023    Tacrolimus, Trough 10.4 03/09/2023    Tacrolimus, Trough 10.7 02/23/2023    Tacrolimus Lvl 9.9 05/14/2023    Tacrolimus Lvl 8.2 05/12/2023    Tacrolimus Lvl 11.4 05/10/2023     Prograf level is accurate 12 hour trough    Patient is tolerating immunosuppression well    WBC/ANC:   low  Lab Results   Component Value Date    WBC 3.2 (L) 05/14/2023       Plan: Will decrease tacrolimus to 2 mg BID, goal 6-8.       OI Prophylaxis:   CMV Status: D-/ R+, moderate risk.   CMV prophylaxis:  letermovir 480 mg  x 3 months per protocol (completed 05/13/23)  Valcyte stopped  for neturopenia  No results found for: CMVCP    HSV ppx: acyclovir 400 mg BID (end 05/18/23)     PCP Prophylaxis: pentamidine 300 mg inhalation monthly x 6 months. Received dose in clinic today.    Thrush: completed in hospital    Patient is  tolerating infectious prophylaxis well    Plan: Continue per protocol.  Stop acyclovir (therapy completed).    HAT ppx:  Meds currently on: aspirin 81 mg daily  LMWH - Completed  Plan: Continue to monitor    CAD prevention:  The ASCVD Risk score (Arnett DK, et al., 2019) failed to calculate for the following reasons:    Cannot find a previous HDL lab    Cannot find a previous total cholesterol lab    Note: For patients with SBP <90 or >200, Total Cholesterol <130 or >320, HDL <20 or >100 which are outside of the allowable range, the calculator will use these upper or lower values to calculate the patient???s risk score.     Statin therapy: Indicated; currently on no statin  Plan: Repeat lipid panel. Consider statin in the future. Continue to monitor.       BP: Goal < 140/90. Clinic vitals reported above  Home BP ranges: 100-110s, 60-70s    Current meds include: amlodipine 5 mg daily, carvedilol 6.25 mg BID  Plan: within goal. Continue to monitor    Anemia of CKD:  H/H:   Lab Results   Component Value Date    HGB 11.2 05/14/2023     Lab Results   Component Value Date    HCT 34.1 05/14/2023     Iron panel:  Lab Results   Component Value Date    IRON 51 03/22/2023    TIBC 229 (L) 03/22/2023    FERRITIN 396.4 (H) 03/08/2023     Lab Results   Component Value Date    Iron Saturation (%) 22 03/22/2023     Prior ESA use: none post txp    Plan: stable. Continue to monitor.     DM:   Lab Results   Component Value Date    A1C 4.6 (L) 02/14/2023   . Goal A1c < 7  History of Dm?  No - post txp hyperglycemia  Established with endocrinologist/PCP for BG managment? No  Currently on:   sitagliptin 100 mg daily  insulin glargine 18 units nightly  insulin lispro 4 units TID AC + correction  FBG: 120-130s, a few FBG in 140s    Diet: not assessed  Exercise: not assessed  Hypoglycemia: None in past week - last hypoglycemia ~3 weeks ago  Plan: No change.  Recommend check A1c at next visit. Continue to monitor.    Electrolytes: wnl  Lab Results   Component Value Date    Potassium 4.1 05/14/2023    Potassium, Bld 3.8 09/07/2022    Sodium 143 05/14/2023    Sodium Whole Blood 131 (L) 09/07/2022    Magnesium 1.8 05/14/2023    CO2 24 05/14/2023       Meds currently on: magnesium plus protein 133 mg BID  Plan: No change.  Continue to monitor     GI/BM: pt reports constipation - using scheduled docusate, Miralax and having 1 BM a day  Meds currently on: docusate BID, Miralax BID, pantoprazole 40 mg daily  Plan: No change.  Continue to monitor    Pain: pt reports no pain  Meds currently on: APAP PRN (not using), methocarbamol 500 mg QID (using every other  day), gabapentin 400 mg BID   Plan: No change.  Continue to monitor.    Bone health:   Vitamin D Level: none available. Goal > 30.   No results found for: VITDTOT, VITDTOTAL    Lab Results   Component Value Date    Calcium 8.5 (L) 05/14/2023    Calcium 8.8 05/12/2023       Last DEXA results:   normal in 2018  Current meds include: none  Plan: Vitamin D level  needs to be drawn with next lab schedule. Continue to monitor.     Women's/Men's Health:  Alicia Armstrong is a 47 y.o. Female perimenopausal. Patient reports no men's/women's health issues  Plan: Continue to monitor    Immunizations:  Influenza [Annual]: Received 2023    PPSV23: Received 08/2016  PCV20: Received 08/2022    Shingrix Zoster [2 doses, 2 - 6 months apart]: 1st dose given 08/2022 and 2nd dose due 10/2022    COVID-19 [3 primary doses, 2 boosters]: 1st dose given 09/22/22    Immunization status: up to date and documented. Getting COVID and Flu today     Pharmacy preference:  [facility]  Medication Refills:  Lantus, tacrolimus  Medication Access:  N/a    Adherence: Patient has good understanding of medications; was not able to independently identify names/doses of immunosuppressants and OI meds.  Patient  does fill their own pill box on a regular basis at home.  Patient brought medication card:no  Pill box:did not bring  Plan: provided moderate adherence counseling/intervention      During this visit, the following was completed:   BG log data assessment  BP log data assessment  Labs ordered and evaluated  complex treatment plan >1 DS   I spent a total of 20 minutes face to face with the patient delivering clinical care and providing education/counseling.    All questions/concerns were addressed to the patient's satisfaction.  __________________________________________  Charlestine Massed, PharmD  PGY2 Pharmacy Resident - Ambulatory Care    Olivia Mackie, PharmD, CPP  Solid Organ Transplant Clinical Pharmacist Practitioner

## 2023-05-19 ENCOUNTER — Telehealth: Admit: 2023-05-19 | Discharge: 2023-05-20 | Payer: PRIVATE HEALTH INSURANCE

## 2023-05-19 DIAGNOSIS — Z944 Liver transplant status: Principal | ICD-10-CM

## 2023-05-19 LAB — BILIRUBIN, DIRECT: BILIRUBIN DIRECT: 0.23 mg/dL (ref 0.00–0.40)

## 2023-05-19 LAB — CBC W/ DIFFERENTIAL
BANDED NEUTROPHILS ABSOLUTE COUNT: 0 10*3/uL (ref 0.0–0.1)
BASOPHILS ABSOLUTE COUNT: 0 10*3/uL (ref 0.0–0.2)
BASOPHILS RELATIVE PERCENT: 1 %
EOSINOPHILS ABSOLUTE COUNT: 0.1 10*3/uL (ref 0.0–0.4)
EOSINOPHILS RELATIVE PERCENT: 3 %
HEMATOCRIT: 37.4 % (ref 34.0–46.6)
HEMOGLOBIN: 12 g/dL (ref 11.1–15.9)
IMMATURE GRANULOCYTES: 0 %
LYMPHOCYTES ABSOLUTE COUNT: 0.8 10*3/uL (ref 0.7–3.1)
LYMPHOCYTES RELATIVE PERCENT: 22 %
MEAN CORPUSCULAR HEMOGLOBIN CONC: 32.1 g/dL (ref 31.5–35.7)
MEAN CORPUSCULAR HEMOGLOBIN: 28.8 pg (ref 26.6–33.0)
MEAN CORPUSCULAR VOLUME: 90 fL (ref 79–97)
MONOCYTES ABSOLUTE COUNT: 0.3 10*3/uL (ref 0.1–0.9)
MONOCYTES RELATIVE PERCENT: 8 %
NEUTROPHILS ABSOLUTE COUNT: 2.3 10*3/uL (ref 1.4–7.0)
NEUTROPHILS RELATIVE PERCENT: 66 %
PLATELET COUNT: 105 10*3/uL — ABNORMAL LOW (ref 150–450)
RED BLOOD CELL COUNT: 4.16 x10E6/uL (ref 3.77–5.28)
RED CELL DISTRIBUTION WIDTH: 14.9 % (ref 11.7–15.4)
WHITE BLOOD CELL COUNT: 3.5 10*3/uL (ref 3.4–10.8)

## 2023-05-19 LAB — COMPREHENSIVE METABOLIC PANEL
ALBUMIN: 4.3 g/dL (ref 3.9–4.9)
ALKALINE PHOSPHATASE: 97 IU/L (ref 44–121)
ALT (SGPT): 16 IU/L (ref 0–32)
AST (SGOT): 14 IU/L (ref 0–40)
BILIRUBIN TOTAL (MG/DL) IN SER/PLAS: 0.6 mg/dL (ref 0.0–1.2)
BLOOD UREA NITROGEN: 14 mg/dL (ref 6–24)
BUN / CREAT RATIO: 17 (ref 9–23)
CALCIUM: 9 mg/dL (ref 8.7–10.2)
CHLORIDE: 104 mmol/L (ref 96–106)
CO2: 24 mmol/L (ref 20–29)
CREATININE: 0.84 mg/dL (ref 0.57–1.00)
EGFR: 87 mL/min/{1.73_m2}
GLOBULIN, TOTAL: 1.6 g/dL (ref 1.5–4.5)
GLUCOSE: 120 mg/dL — ABNORMAL HIGH (ref 70–99)
POTASSIUM: 4.2 mmol/L (ref 3.5–5.2)
SODIUM: 143 mmol/L (ref 134–144)
TOTAL PROTEIN: 5.9 g/dL — ABNORMAL LOW (ref 6.0–8.5)

## 2023-05-19 LAB — GAMMA GT: GAMMA GLUTAMYL TRANSFERASE: 18 IU/L (ref 0–60)

## 2023-05-19 LAB — MAGNESIUM: MAGNESIUM: 1.9 mg/dL (ref 1.6–2.3)

## 2023-05-19 LAB — PHOSPHORUS: PHOSPHORUS, SERUM: 3.5 mg/dL (ref 3.0–4.3)

## 2023-05-19 NOTE — Unmapped (Signed)
[facility] Outpatient Nutrition Services   Medical Nutrition Therapy Consultation       Visit Type:    Return Assessment    Referral Reason: :  Liver Transplant 02/15/23      Alicia Armstrong is a 47 y.o. female seen for medical nutrition therapy post liver transplant. Her active problem list, medication list, and allergies were reviewed.     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 Coli septic shock.    I utilized interpreter services for communication assistance between [place] and Spanish (Interpreter Name: Judith Part).      Anthropometrics   Estimated body mass index is 32.98 kg/m?? as calculated from the following:    Height as of 05/18/23: 140 cm (4' 7.12).    Weight as of 05/18/23: 64.6 kg (142 lb 8 oz).    Wt Readings from Last 5 Encounters:   05/18/23 64.6 kg (142 lb 8 oz)   05/18/23 64.6 kg (142 lb 8 oz)   05/05/23 62.1 kg (137 lb)   04/22/23 63.5 kg (139 lb 14.4 oz)   04/12/23 62 kg (136 lb 9.6 oz)      Usual body weight: patient unsure of dry weight ; though does report gaining weight recently with improved appetite   Ideal Body Weight:   49.5 kg       Nutrition Risk Screening:     Nutrition Focused Physical Exam:  Unable to complete at this time due to virtual visit        Malnutrition Screening:   Malnutrition assessment not yet completed at this time due to inability to complete nutrition focused physical exam (NFPE).      Biochemical Data, Medical Tests and Procedures:  All pertinent labs and imaging reviewed by Idolina Primer, RD/LDN at 9:52 AM 05/19/2023.    Patient reports her blood glucose levels have been ~124- 144 on average when she checks. She has had 1 low level in the past month, she reports having a 300 blood glucose level when eating bread. She checks her blood glucose every time she eats.      Absolute Neutrophil Count= 2.1- 2.3    Lab Results   Component Value Date    Hemoglobin A1C 4.6 (L) 02/14/2023    Hemoglobin A1C 4.7 (L) 12/29/2022    Hemoglobin A1C 5.0 11/11/2016      No results found for: VITAMINA  No results found for: VITDTOTAL  No results found for: [facility]  Lab Results   Component Value Date    CRP 44.0 (H) 10/31/2022     Lab Results   Component Value Date    Zinc 30 (L) 09/10/2022     Lab Results   Component Value Date    Copper 56 (L) 09/10/2022       Lab Results   Component Value Date    BUN 14 05/18/2023    CREATININE 0.84 05/18/2023    GFRAA >=60 11/25/2017    GFRNONAA >=60 11/25/2017    NA 143 05/18/2023    K 4.2 05/18/2023    CL 104 05/18/2023    CO2 24 05/18/2023    CALCIUM 9.0 05/18/2023    PHOS 3.7 04/12/2023    ALBUMIN 3.7 04/12/2023       Medications and Vitamin/Mineral Supplementation:   All nutritionally pertinent medications reviewed on 05/19/2023.   Nutritionally pertinent medications include: colace,  lantus + humalog, prednisone, Januvia, Miralax  She is taking nutrition supplements. magnesium oxide- AA chelate    Current Outpatient Medications   Medication Sig Dispense Refill    acetaminophen (TYLENOL) 325 MG tablet Take 1-2 tablets (325-650 mg total) by mouth every eight (8) hours as needed for pain or fever. 60 tablet 11    amlodipine (NORVASC) 5 MG tablet Take 1 tablet (5 mg total) by mouth daily. 30 tablet 11    aspirin (ECOTRIN) 81 MG tablet Take 1 tablet (81 mg total) by mouth daily. 30 tablet 11    blood sugar diagnostic (ACCU-CHEK GUIDE TEST STRIPS) Strp by Other route four (4) times a day. 100 strip 11    carvedilol (COREG) 6.25 MG tablet Take 1 tablet (6.25 mg total) by mouth two (2) times a day. 60 tablet 11    docusate sodium (COLACE) 100 MG capsule Take 1 capsule (100 mg total) by mouth two (2) times a day as needed for constipation. 60 capsule 11    gabapentin (NEURONTIN) 400 MG capsule Take 1 capsule (400 mg total) by mouth two (2) times a day. 90 capsule 11    insulin glargine (BASAGLAR, LANTUS) 100 unit/mL (3 mL) injection pen Inject 0.18 mL (18 Units total) under the skin nightly. 15 mL 3    insulin lispro (HUMALOG) 100 unit/mL injection pen Inject 4 Units under the skin Three (3) times a day before meals. Take in addition to sliding scale. Max 50 units per day 15 mL 11    lancets Misc Use to check blood sugar as directed with insulin 3 times a day & for symptoms of high or low blood sugar. 100 each 11    magnesium oxide-Mg AA chelate (MAGNESIUM, AMINO ACID CHELATE,) 133 mg Take 1 tablet by mouth two (2) times a day. 100 tablet 6    methocarbamol (ROBAXIN) 500 MG tablet Take 1 tablet (500 mg total) by mouth four (4) times a day as needed. 120 tablet 1    mycophenolate (CELLCEPT) 250 mg capsule Take 2 capsules (500 mg total) by mouth two (2) times a day. 120 capsule 11    pantoprazole (PROTONIX) 40 MG tablet Take 1 tablet (40 mg total) by mouth daily. 30 tablet 11    pen needle, diabetic 32 gauge x 5/32 (4 mm) Ndle Use with insulin up to 4 times/day as needed. 100 each 11    polyethylene glycol (MIRALAX) 17 gram packet Mix 1 packet in  4 to 8 ounces of liquid and drink daily as needed. 30 packet 11    predniSONE (DELTASONE) 5 MG tablet Take 1 tablet (5 mg total) by mouth daily. 30 tablet 5    SITagliptin phosphate (JANUVIA) 100 MG tablet Take 1 tablet (100 mg total) by mouth daily. 30 tablet 11    tacrolimus (PROGRAF) 1 MG capsule Take 2 capsules (2 mg total) by mouth two (2) times a day. 120 capsule 11     No current facility-administered medications for this visit.       Nutrition History:     Dietary Restrictions: No known food allergies or food intolerances.   Patient does not cook with salt and does not use lots of butter or oil.     Gastrointestinal Issues: Constipation  - patient is taking Miralax with good effect     Hunger and Satiety: Denied issues.   Patient with too good of an appetite right now, she reports trying not to over eat.     Food  Safety and Access: No to little issues noted. Diet Recall:     Time Intake   Breakfast Eggs   OR corn tamales   OR pancakes    Snack (AM)    Lunch Beef and veggies   Snack (PM)    Dinner Chicken and veggies   Snack (HS)      Food-Related History:    Beverages: Ensure 1-2x daily, water throughout the day, coffee in the morning   Dining Out:  minimal   Usual Food Choices: only fruits she wants to eat are mangos or watermelon     Physical Activity:  Physical activity level is light with some exercise.   Patient with minimal fatigue, and no issues with activities of daily living. Patient reports she was told to walk daily when she was in clinic this week, this writer also encouraged walking and exercising regularly.     Daily Estimated Nutrient Needs:  Energy: 1800- 2160 kcals [25-30 kcal/kg]  Protein: 80- 95 gm [1.0- 1.2 gm/kg]  Fluid:   [per MD team]    Nutrition Goals & Evaluation      1. Meet estimated daily needs (Met)  2. Normal vitamin levels (Progressing)  3. Balanced macronutrient intake (Progressing)  4. Hemoglobin A1c <7% (Met)    Nutrition goals reviewed, and relevant barriers identified and addressed: none evident. She is evaluated to have fair willingness and ability to achieve nutrition goals.     Nutrition Assessment     Per the patient's diet recall, nutrition intake has improved and is likely meeting or exceeding estimated nutrition needs. Applauded patient for improvement in intake, and did encourage her to stop the Ensure supplementation as she is likely eating enough solid foods to meet her needs. Patient was amenable with this, as Ensure is expensive. Discussed liberalizing food safety precautions, and patient was very excited to have some [place] cheeses and uncooked vegetables now. Anticipate intake will only improve more with less diet restrictions. Also encouraged patient to start regular walking/exercise now that she is ~3 months out from transplant.     Nutrition Intervention      Nutrition Education: liberalization of food safety precautions     Materials Provided were:  contact information          Nutrition Plan:     Liberalize food safety precautions   Discontinue Ensure supplementation given good PO intake and weight gain   Increase exercise to 30 minutes, 5 days per week     Follow up will occur at 6 months post transplant.     Food/Nutrition-related history, Anthropometric measurements, and Biochemical data, medical tests, procedures will be assessed at time of follow-up.       Recommendations for Care Team:     Liberalize food safety precautions. Stop Ensure supplementation      Patient seen according to established protocol for transplant care.      Time spent 25 minutes     The patient reports they are physically located in [place] and is currently: at home. I conducted a audio/video visit. I spent  110m 55s on the video call with the patient. I spent an additional 24 minutes on pre- and post-visit activities on the date of service .       I am located on-site and the patient is located off-site for this visit.       Lanelle Bal, RD, LDN  Abdominal Transplant Dietitian   Pager: [phone]

## 2023-05-19 NOTE — Unmapped (Addendum)
[facility] Pharmacist has reviewed a new prescription for tacrolimus that indicates a dose decrease.  Patient was counseled on this dosage change by coordinator CG- see epic note from 9/24.  Next refill call date adjusted if necessary.        Clinical Assessment Needed For: Dose Change  Medication: Tacrolimus 1mg  capsule  Last Fill Date/Day Supply: 04/07/2023 / 30 days  Copay $0  Was previous dose already scheduled to fill: No    Notes to Pharmacist: N/A

## 2023-05-19 NOTE — Unmapped (Signed)
Pt lvm asking for clarification re appts tomorrow after receiving call about 12 pm appt and needing her to arrive at 11:30.    Called pt and told her that appt at 12:00 is still virtual regardless of what messaged said. She expressed relief and then specifically asked that an interpreter be included in SW appt tomorrow at 12:00. Told her this tpa had requested that, and she verbalized understanding of all discussed.    Entered extra comment in appt notes above Spanish Interpreter Needed that pt specifically asked that interpreter be included.

## 2023-05-20 ENCOUNTER — Institutional Professional Consult (permissible substitution): Admit: 2023-05-20 | Discharge: 2023-05-21 | Payer: PRIVATE HEALTH INSURANCE

## 2023-05-20 DIAGNOSIS — Z944 Liver transplant status: Principal | ICD-10-CM

## 2023-05-20 DIAGNOSIS — Z796 Long-term use of immunosuppressant medication: Principal | ICD-10-CM

## 2023-05-20 LAB — TACROLIMUS LEVEL: TACROLIMUS BLOOD: 5.1 ng/mL (ref 2.0–20.0)

## 2023-05-20 MED ORDER — TACROLIMUS 1 MG CAPSULE, IMMEDIATE-RELEASE
ORAL_CAPSULE | Freq: Two times a day (BID) | ORAL | 11 refills | 30 days | Status: CP
Start: 2023-05-20 — End: ?
  Filled 2023-05-20: qty 180, 30d supply, fill #0

## 2023-05-20 MED FILL — DOCUSATE SODIUM 100 MG CAPSULE: ORAL | 30 days supply | Qty: 60 | Fill #1

## 2023-05-20 MED FILL — ACETAMINOPHEN 325 MG TABLET: ORAL | 10 days supply | Qty: 60 | Fill #0

## 2023-05-20 MED FILL — HEALTHYLAX 17 GRAM ORAL POWDER PACKET: ORAL | 7 days supply | Qty: 7 | Fill #2

## 2023-05-20 MED FILL — UNIFINE PENTIPS 32 GAUGE X 5/32" NEEDLE (4 MM): 25 days supply | Qty: 100 | Fill #1

## 2023-05-20 MED FILL — MYCOPHENOLATE MOFETIL 250 MG CAPSULE: ORAL | 30 days supply | Qty: 120 | Fill #1

## 2023-05-20 MED FILL — LANTUS SOLOSTAR U-100 INSULIN 100 UNIT/ML (3 ML) SUBCUTANEOUS PEN: SUBCUTANEOUS | 83 days supply | Qty: 15 | Fill #0

## 2023-05-20 MED FILL — PANTOPRAZOLE 40 MG TABLET,DELAYED RELEASE: ORAL | 30 days supply | Qty: 30 | Fill #1

## 2023-05-20 MED FILL — ACCU-CHEK SOFTCLIX LANCETS: 30 days supply | Qty: 100 | Fill #1

## 2023-05-20 MED FILL — ACCU-CHEK GUIDE TEST STRIPS: 25 days supply | Qty: 100 | Fill #0

## 2023-05-20 NOTE — Unmapped (Addendum)
[facility] Pharmacist has reviewed a new prescription for tacrolimus that indicates a dose increase.  Patient was counseled on this dosage change by [facility]- see epic note from 05/20/23.  Next refill call date adjusted if necessary.      Clinical Assessment Needed For: Dose Change  Medication: Tacrolimus 1mg  capsule  Last Fill Date/Day Supply: 04/07/2023 / 30 days  Copay $0  Was previous dose already scheduled to fill: No    Notes to Pharmacist: N/A

## 2023-05-20 NOTE — Unmapped (Signed)
Patient's 9/24 Tac level resulted this morning at 5.1. Patient's dose reduced in clinic that day, since she is now 3 mos out from liver txp. Level likely dropped that day d/t stopping letermovir on 9/19. Per PharmD Chargualaf, should increase dose to 3mg  bid. Spoke with patient this morning, who reports she had not adjusted her dose down yet to the 2mg  bid ordered, but continued to take 3mg /2mg  daily and repeated labs this morning. Relayed new dose adjustment and instructed her to repeat labs again on Monday. She verbalized understanding.

## 2023-05-20 NOTE — Unmapped (Signed)
The patient reports they are physically located in [place] and is currently: at home. I conducted a phone visit.  I spent 20 minutes on the phone call with the patient on the date of service .       **THIS PATIENT WAS NOT SEEN IN PERSON TO MINIMIZE POTENTIAL SPREAD OF COVID-19, PROTECT PATIENTS/PROVIDERS, AND REDUCE PPE UTILIZATION.**    PATIENT NAME: Alicia Armstrong     MR#: [id number]    DOB: July 18, 1976    [facility]  CONFIDENTIAL SOCIAL WORK   3 MONTH LIVER POST TRANSPLANT FOLLOW UP      DATE OF EVALUATION: 05/20/2023    INFORMANTS: Patient/Alicia Armstrong    PREFERRED LANGUAGE: English     INTERPRETER UTILIZED: [employer]/#[id number]    TXP CARE TEAM:   Post Transplant RN Coordinator: Alicia Armstrong [phone]  Primary Transplant Provider: Trinda Armstrong, Alicia Armstrong, Alicia Armstrong, Alicia Armstrong    REFERRAL INFORMATION:    Alicia Armstrong is a 47 y.o. Hispanic female is s/p transplant for liver transplantation . CSW follows up to assess 3 month post phase.     TRANSPLANT DATE:   02/15/2023 (Liver)    MOST RECENT HOSPITAL ADMISSION (@ [place]):   Previous admit date: 03/30/2023 to 05/09/23    FUTURE APPOINTMENTS (@ [place]):   Future Appointments   Date Time Provider Department Center   06/17/2023  2:00 PM SURTRA NURSE [facility]   07/15/2023  2:00 PM SUR[facility] NURSE [facility]   08/05/2023  9:30 AM Suzan Nailer, PsyD [facility]   08/05/2023  2:00 PM TRANSPLANT SURGERY PHARMACY [facility]   08/10/2023 10:00 AM Janyth Pupa, MD [facility]   08/12/2023  2:00 PM SUR[facility] NURSE [facility]   11/15/2023  2:00 PM TRANSPLANT SURGERY PHARMACY [facility]   11/16/2023 10:00 AM Janyth Pupa, MD [facility]   02/11/2024 10:00 AM Suzan Nailer, PsyD [facility]   02/11/2024  1:00 PM Smiley Houseman [facility]   02/14/2024  1:00 PM Bridgens, Jorja Loa, RD/LDN nutr [facility]   02/14/2024  2:00 PM TRANSPLANT SURGERY PHARMACY [facility]   02/15/2024 10:30 AM Barritt, Melonie [place], MD [facility]       HOME HEALTH/DME NEEDS AT LAST DC:   HH: N/A   Services: N/A   Contact: n/a  DME: N/A  Other: N/A  Other Remaining:   Ureter stent: No  Staples: No  Surgical drains x0: No  PD Cath: N/A  Central line: N/A    LIFESTYLE:  Physical activity:  Good  Nutrition/Appetite:  Excellent  Sleep:  reports that insomnia has improved;     SOCIAL HISTORY:  Marital Status: married  Lives with: adult son/Alicia Armstrong and spouse  Children/Dependents: adult son (lives with patient and father and adult daughter (married)   Other Social support: church family, relatives on [place]   Housing: rental property;  good Psychologist, forensic, utilities function, and appliances function    CAREGIVING PLAN:  Location:  no change;   Support/Caregivers:  spouse, son, church family   Household tasks/errands:  independent; taking care of household, cooking   Medication:  self administered   Transportation: no changes     COMPLIANCE HISTORY:  Medication Adherence: Excellent  Medication Concerns: denied problems taking medications, concerns about side effects, affordability, problems obtaining medications, and  difficulty remembering medications  Other Adherence: Excellent     Side Effects: none    INSURANCE:  [employer] assistance: N/A  Payer/Plan Subscriber Name Rel Member # Group #   BCBS Mauri Pole PLAN [facility] Spouse  [id number]      [address]   COMMERCIAL INSURANCE * Kamp,Erianna KARINA Self [id number]       [address], [place] [address]       INCOME:   Reports that she attempted to apply and was denied again; she does not want to try anymore. Upon further discussion, it was determined that she declined based on her spouse being over income and a lack of work credits.     ATTITUDE ABOUT TRANSPLANT:  Expectations: grateful; all Glory to God   Fears/Concerns: denied   What would you change?:  denied   Rec'd Info on Ltr to Donor Family: yes    MENTAL HEALTH HISTORY: reflective of current   Current issues/mood: denies  Medications: denies  Therapy: denies  SI/HI: denies    PHQ-2 Score:  0  GAD-7 Score:  0    SUBSTANCE HISTORY: reflective of current   Tobacco: denies  Alcohol: denies  Illicit Substances: denies  OTC/Supplements: denies    PAIN HISTORY: reflective of current  Current : denied   Current use of pain medication/pain control: denied     SUMMARY:  Overall, Alicia Armstrong is doing very well at the phase of her post-transplant journey.       Anticipatory guidance/education provided on the following:  --Possibility of readmissions, unplanned clinic visits, extra lab work        RECOMMENDATIONS:   1. F/U as scheduled with CSW       Alicia Melter, Alicia Armstrong, CCTSW  Transplant Case Manager/Social Worker  [facility] for Transplant Care  Completed: 05/20/23

## 2023-05-21 LAB — PHOSPHORUS: PHOSPHORUS, SERUM: 3.4 mg/dL (ref 3.0–4.3)

## 2023-05-21 LAB — BILIRUBIN, DIRECT: BILIRUBIN DIRECT: 0.2 mg/dL (ref 0.00–0.40)

## 2023-05-21 LAB — COMPREHENSIVE METABOLIC PANEL
ALBUMIN: 4.2 g/dL (ref 3.9–4.9)
ALKALINE PHOSPHATASE: 91 IU/L (ref 44–121)
ALT (SGPT): 16 IU/L (ref 0–32)
AST (SGOT): 16 IU/L (ref 0–40)
BILIRUBIN TOTAL (MG/DL) IN SER/PLAS: 0.6 mg/dL (ref 0.0–1.2)
BLOOD UREA NITROGEN: 16 mg/dL (ref 6–24)
BUN / CREAT RATIO: 21 (ref 9–23)
CALCIUM: 8.9 mg/dL (ref 8.7–10.2)
CHLORIDE: 104 mmol/L (ref 96–106)
CO2: 25 mmol/L (ref 20–29)
CREATININE: 0.78 mg/dL (ref 0.57–1.00)
EGFR: 95 mL/min/{1.73_m2}
GLOBULIN, TOTAL: 1.8 g/dL (ref 1.5–4.5)
GLUCOSE: 120 mg/dL — ABNORMAL HIGH (ref 70–99)
POTASSIUM: 4.1 mmol/L (ref 3.5–5.2)
SODIUM: 140 mmol/L (ref 134–144)
TOTAL PROTEIN: 6 g/dL (ref 6.0–8.5)

## 2023-05-21 LAB — CBC W/ DIFFERENTIAL
BANDED NEUTROPHILS ABSOLUTE COUNT: 0 10*3/uL (ref 0.0–0.1)
BASOPHILS ABSOLUTE COUNT: 0 10*3/uL (ref 0.0–0.2)
BASOPHILS RELATIVE PERCENT: 0 %
EOSINOPHILS ABSOLUTE COUNT: 0.1 10*3/uL (ref 0.0–0.4)
EOSINOPHILS RELATIVE PERCENT: 4 %
HEMATOCRIT: 36.6 % (ref 34.0–46.6)
HEMOGLOBIN: 11.9 g/dL (ref 11.1–15.9)
IMMATURE GRANULOCYTES: 0 %
LYMPHOCYTES ABSOLUTE COUNT: 0.5 10*3/uL — ABNORMAL LOW (ref 0.7–3.1)
LYMPHOCYTES RELATIVE PERCENT: 18 %
MEAN CORPUSCULAR HEMOGLOBIN CONC: 32.5 g/dL (ref 31.5–35.7)
MEAN CORPUSCULAR HEMOGLOBIN: 28.8 pg (ref 26.6–33.0)
MEAN CORPUSCULAR VOLUME: 89 fL (ref 79–97)
MONOCYTES ABSOLUTE COUNT: 0.3 10*3/uL (ref 0.1–0.9)
MONOCYTES RELATIVE PERCENT: 11 %
NEUTROPHILS ABSOLUTE COUNT: 1.9 10*3/uL (ref 1.4–7.0)
NEUTROPHILS RELATIVE PERCENT: 67 %
PLATELET COUNT: 102 10*3/uL — ABNORMAL LOW (ref 150–450)
RED BLOOD CELL COUNT: 4.13 x10E6/uL (ref 3.77–5.28)
RED CELL DISTRIBUTION WIDTH: 14.4 % (ref 11.7–15.4)
WHITE BLOOD CELL COUNT: 2.8 10*3/uL — ABNORMAL LOW (ref 3.4–10.8)

## 2023-05-21 LAB — GAMMA GT: GAMMA GLUTAMYL TRANSFERASE: 17 IU/L (ref 0–60)

## 2023-05-21 LAB — MAGNESIUM: MAGNESIUM: 1.9 mg/dL (ref 1.6–2.3)

## 2023-05-22 LAB — TACROLIMUS LEVEL: TACROLIMUS BLOOD: 4.2 ng/mL (ref 2.0–20.0)

## 2023-05-24 DIAGNOSIS — Z796 Long-term use of immunosuppressant medication: Principal | ICD-10-CM

## 2023-05-24 DIAGNOSIS — Z944 Liver transplant status: Principal | ICD-10-CM

## 2023-05-24 MED ORDER — TACROLIMUS 1 MG CAPSULE, IMMEDIATE-RELEASE
ORAL_CAPSULE | 11 refills | 0 days | Status: CP
Start: 2023-05-24 — End: ?

## 2023-05-24 NOTE — Unmapped (Signed)
Patient stopped letermovir on 9/19 and was told to reduce her Tac dose on 9/24 to 2mg , since she is 3 mos out and goal is 6-8. This TNC contacted patient last Thurs after her 9/24 Tac resulted at 5.1. Patient said she had not reduced herTac dose and was still taking 3mg /2mg , so she was told to increase her dose back to 3mg  bid. Today, results show her 9/26 Tac level was 4.2. Discussed with PharmD Chargualaf, who recommended increasing her dose to 5mg /4mg  daily.    Spoke with patient, who confirmed she had been taking the 3mg  bid dose since our last conversation and that the level was reliable. Relayed dose adjustment. She verbalized understanding and agreed to take 2 more mg this morning and repeat labs again on Wed.

## 2023-05-25 DIAGNOSIS — D702 Other drug-induced agranulocytosis: Principal | ICD-10-CM

## 2023-05-25 DIAGNOSIS — Z944 Liver transplant status: Principal | ICD-10-CM

## 2023-05-25 LAB — COMPREHENSIVE METABOLIC PANEL
ALBUMIN: 4.2 g/dL (ref 3.9–4.9)
ALKALINE PHOSPHATASE: 88 IU/L (ref 44–121)
ALT (SGPT): 17 IU/L (ref 0–32)
AST (SGOT): 17 IU/L (ref 0–40)
BILIRUBIN TOTAL (MG/DL) IN SER/PLAS: 0.5 mg/dL (ref 0.0–1.2)
BLOOD UREA NITROGEN: 13 mg/dL (ref 6–24)
BUN / CREAT RATIO: 19 (ref 9–23)
CALCIUM: 9.1 mg/dL (ref 8.7–10.2)
CHLORIDE: 103 mmol/L (ref 96–106)
CO2: 25 mmol/L (ref 20–29)
CREATININE: 0.69 mg/dL (ref 0.57–1.00)
EGFR: 108 mL/min/{1.73_m2}
GLOBULIN, TOTAL: 1.8 g/dL (ref 1.5–4.5)
GLUCOSE: 147 mg/dL — ABNORMAL HIGH (ref 70–99)
POTASSIUM: 4.3 mmol/L (ref 3.5–5.2)
SODIUM: 141 mmol/L (ref 134–144)
TOTAL PROTEIN: 6 g/dL (ref 6.0–8.5)

## 2023-05-25 LAB — CBC W/ DIFFERENTIAL
BANDED NEUTROPHILS ABSOLUTE COUNT: 0 10*3/uL (ref 0.0–0.1)
BASOPHILS ABSOLUTE COUNT: 0 10*3/uL (ref 0.0–0.2)
BASOPHILS RELATIVE PERCENT: 1 %
EOSINOPHILS ABSOLUTE COUNT: 0.1 10*3/uL (ref 0.0–0.4)
EOSINOPHILS RELATIVE PERCENT: 4 %
HEMATOCRIT: 38 % (ref 34.0–46.6)
HEMOGLOBIN: 12.2 g/dL (ref 11.1–15.9)
IMMATURE GRANULOCYTES: 0 %
LYMPHOCYTES ABSOLUTE COUNT: 0.6 10*3/uL — ABNORMAL LOW (ref 0.7–3.1)
LYMPHOCYTES RELATIVE PERCENT: 19 %
MEAN CORPUSCULAR HEMOGLOBIN CONC: 32.1 g/dL (ref 31.5–35.7)
MEAN CORPUSCULAR HEMOGLOBIN: 29 pg (ref 26.6–33.0)
MEAN CORPUSCULAR VOLUME: 91 fL (ref 79–97)
MONOCYTES ABSOLUTE COUNT: 0.2 10*3/uL (ref 0.1–0.9)
MONOCYTES RELATIVE PERCENT: 7 %
NEUTROPHILS ABSOLUTE COUNT: 2.1 10*3/uL (ref 1.4–7.0)
NEUTROPHILS RELATIVE PERCENT: 69 %
PLATELET COUNT: 107 10*3/uL — ABNORMAL LOW (ref 150–450)
RED BLOOD CELL COUNT: 4.2 x10E6/uL (ref 3.77–5.28)
RED CELL DISTRIBUTION WIDTH: 14.9 % (ref 11.7–15.4)
WHITE BLOOD CELL COUNT: 3.1 10*3/uL — ABNORMAL LOW (ref 3.4–10.8)

## 2023-05-25 LAB — PHOSPHORUS: PHOSPHORUS, SERUM: 3.5 mg/dL (ref 3.0–4.3)

## 2023-05-25 LAB — MAGNESIUM: MAGNESIUM: 1.8 mg/dL (ref 1.6–2.3)

## 2023-05-25 LAB — GAMMA GT: GAMMA GLUTAMYL TRANSFERASE: 15 IU/L (ref 0–60)

## 2023-05-25 LAB — BILIRUBIN, DIRECT: BILIRUBIN DIRECT: 0.18 mg/dL (ref 0.00–0.40)

## 2023-05-26 LAB — TACROLIMUS LEVEL: TACROLIMUS BLOOD: 7.2 ng/mL (ref 2.0–20.0)

## 2023-05-26 NOTE — Unmapped (Addendum)
[facility] Pharmacist has reviewed a new prescription for tacrolimus that indicates a dose increase.  Patient was counseled on this dosage change by [facility]- see epic note from 05/24/23.  Next refill call date adjusted if necessary.      Clinical Assessment Needed For: Dose Change  Medication: tacrolimus 1 MG capsule (PROGRAF)  Last Fill Date/Day Supply: 05/20/23 / 30  Refill Too Soon until 06/11/23  Was previous dose already scheduled to fill: No    Notes to Pharmacist: n/a

## 2023-05-26 NOTE — Unmapped (Signed)
Sent green 3 mos post-liver txp education book to patient and requested she contact TNC on receipt. Will plan to return call with interpreter to discuss.

## 2023-05-27 LAB — CBC W/ DIFFERENTIAL
BANDED NEUTROPHILS ABSOLUTE COUNT: 0 10*3/uL (ref 0.0–0.1)
BASOPHILS ABSOLUTE COUNT: 0 10*3/uL (ref 0.0–0.2)
BASOPHILS RELATIVE PERCENT: 0 %
EOSINOPHILS ABSOLUTE COUNT: 0.1 10*3/uL (ref 0.0–0.4)
EOSINOPHILS RELATIVE PERCENT: 4 %
HEMATOCRIT: 37.1 % (ref 34.0–46.6)
HEMOGLOBIN: 12 g/dL (ref 11.1–15.9)
IMMATURE GRANULOCYTES: 0 %
LYMPHOCYTES ABSOLUTE COUNT: 0.7 10*3/uL (ref 0.7–3.1)
LYMPHOCYTES RELATIVE PERCENT: 25 %
MEAN CORPUSCULAR HEMOGLOBIN CONC: 32.3 g/dL (ref 31.5–35.7)
MEAN CORPUSCULAR HEMOGLOBIN: 28.6 pg (ref 26.6–33.0)
MEAN CORPUSCULAR VOLUME: 89 fL (ref 79–97)
MONOCYTES ABSOLUTE COUNT: 0.3 10*3/uL (ref 0.1–0.9)
MONOCYTES RELATIVE PERCENT: 9 %
NEUTROPHILS ABSOLUTE COUNT: 1.8 10*3/uL (ref 1.4–7.0)
NEUTROPHILS RELATIVE PERCENT: 62 %
PLATELET COUNT: 108 10*3/uL — ABNORMAL LOW (ref 150–450)
RED BLOOD CELL COUNT: 4.19 x10E6/uL (ref 3.77–5.28)
RED CELL DISTRIBUTION WIDTH: 14.5 % (ref 11.7–15.4)
WHITE BLOOD CELL COUNT: 2.9 10*3/uL — ABNORMAL LOW (ref 3.4–10.8)

## 2023-05-27 LAB — COMPREHENSIVE METABOLIC PANEL
ALBUMIN: 4.1 g/dL (ref 3.9–4.9)
ALKALINE PHOSPHATASE: 84 IU/L (ref 44–121)
ALT (SGPT): 16 IU/L (ref 0–32)
AST (SGOT): 16 IU/L (ref 0–40)
BILIRUBIN TOTAL (MG/DL) IN SER/PLAS: 0.4 mg/dL (ref 0.0–1.2)
BLOOD UREA NITROGEN: 16 mg/dL (ref 6–24)
BUN / CREAT RATIO: 22 (ref 9–23)
CALCIUM: 8.6 mg/dL — ABNORMAL LOW (ref 8.7–10.2)
CHLORIDE: 104 mmol/L (ref 96–106)
CO2: 23 mmol/L (ref 20–29)
CREATININE: 0.74 mg/dL (ref 0.57–1.00)
EGFR: 101 mL/min/{1.73_m2}
GLOBULIN, TOTAL: 1.6 g/dL (ref 1.5–4.5)
GLUCOSE: 137 mg/dL — ABNORMAL HIGH (ref 70–99)
POTASSIUM: 4.2 mmol/L (ref 3.5–5.2)
SODIUM: 142 mmol/L (ref 134–144)
TOTAL PROTEIN: 5.7 g/dL — ABNORMAL LOW (ref 6.0–8.5)

## 2023-05-27 LAB — BILIRUBIN, DIRECT: BILIRUBIN DIRECT: 0.17 mg/dL (ref 0.00–0.40)

## 2023-05-27 LAB — PHOSPHORUS: PHOSPHORUS, SERUM: 3.3 mg/dL (ref 3.0–4.3)

## 2023-05-27 LAB — MAGNESIUM: MAGNESIUM: 1.9 mg/dL (ref 1.6–2.3)

## 2023-05-27 LAB — GAMMA GT: GAMMA GLUTAMYL TRANSFERASE: 14 IU/L (ref 0–60)

## 2023-05-28 LAB — TACROLIMUS LEVEL: TACROLIMUS BLOOD: 7.9 ng/mL (ref 2.0–20.0)

## 2023-06-01 LAB — CBC W/ DIFFERENTIAL
BANDED NEUTROPHILS ABSOLUTE COUNT: 0 10*3/uL (ref 0.0–0.1)
BASOPHILS ABSOLUTE COUNT: 0 10*3/uL (ref 0.0–0.2)
BASOPHILS RELATIVE PERCENT: 0 %
EOSINOPHILS ABSOLUTE COUNT: 0.1 10*3/uL (ref 0.0–0.4)
EOSINOPHILS RELATIVE PERCENT: 3 %
HEMATOCRIT: 35.1 % (ref 34.0–46.6)
HEMOGLOBIN: 11.4 g/dL (ref 11.1–15.9)
IMMATURE GRANULOCYTES: 0 %
LYMPHOCYTES ABSOLUTE COUNT: 0.7 10*3/uL (ref 0.7–3.1)
LYMPHOCYTES RELATIVE PERCENT: 26 %
MEAN CORPUSCULAR HEMOGLOBIN CONC: 32.5 g/dL (ref 31.5–35.7)
MEAN CORPUSCULAR HEMOGLOBIN: 28.3 pg (ref 26.6–33.0)
MEAN CORPUSCULAR VOLUME: 87 fL (ref 79–97)
MONOCYTES ABSOLUTE COUNT: 0.3 10*3/uL (ref 0.1–0.9)
MONOCYTES RELATIVE PERCENT: 10 %
NEUTROPHILS ABSOLUTE COUNT: 1.6 10*3/uL (ref 1.4–7.0)
NEUTROPHILS RELATIVE PERCENT: 61 %
PLATELET COUNT: 91 10*3/uL — CL (ref 150–450)
RED BLOOD CELL COUNT: 4.03 x10E6/uL (ref 3.77–5.28)
RED CELL DISTRIBUTION WIDTH: 13.7 % (ref 11.7–15.4)
WHITE BLOOD CELL COUNT: 2.6 10*3/uL — ABNORMAL LOW (ref 3.4–10.8)

## 2023-06-01 LAB — COMPREHENSIVE METABOLIC PANEL
ALBUMIN: 4.3 g/dL (ref 3.9–4.9)
ALKALINE PHOSPHATASE: 84 IU/L (ref 44–121)
ALT (SGPT): 15 IU/L (ref 0–32)
AST (SGOT): 14 IU/L (ref 0–40)
BILIRUBIN TOTAL (MG/DL) IN SER/PLAS: 0.5 mg/dL (ref 0.0–1.2)
BLOOD UREA NITROGEN: 22 mg/dL (ref 6–24)
BUN / CREAT RATIO: 27 — ABNORMAL HIGH (ref 9–23)
CALCIUM: 8.7 mg/dL (ref 8.7–10.2)
CHLORIDE: 104 mmol/L (ref 96–106)
CO2: 22 mmol/L (ref 20–29)
CREATININE: 0.83 mg/dL (ref 0.57–1.00)
EGFR: 88 mL/min/{1.73_m2}
GLOBULIN, TOTAL: 1.5 g/dL (ref 1.5–4.5)
GLUCOSE: 116 mg/dL — ABNORMAL HIGH (ref 70–99)
POTASSIUM: 4.1 mmol/L (ref 3.5–5.2)
SODIUM: 140 mmol/L (ref 134–144)
TOTAL PROTEIN: 5.8 g/dL — ABNORMAL LOW (ref 6.0–8.5)

## 2023-06-01 LAB — BILIRUBIN, DIRECT: BILIRUBIN DIRECT: 0.15 mg/dL (ref 0.00–0.40)

## 2023-06-01 LAB — PHOSPHORUS: PHOSPHORUS, SERUM: 3.4 mg/dL (ref 3.0–4.3)

## 2023-06-01 LAB — TACROLIMUS LEVEL: TACROLIMUS BLOOD: 9.3 ng/mL (ref 2.0–20.0)

## 2023-06-01 LAB — GAMMA GT: GAMMA GLUTAMYL TRANSFERASE: 13 IU/L (ref 0–60)

## 2023-06-01 LAB — MAGNESIUM: MAGNESIUM: 1.7 mg/dL (ref 1.6–2.3)

## 2023-06-01 NOTE — Unmapped (Signed)
Patient contacted TNC to report that she received the green post-liver txp book. Let her know that this TNC would call her back later with interpreter assistance to review it.

## 2023-06-01 NOTE — Unmapped (Signed)
[address] [place] [id number], Alicia Armstrong [id number], and [place] [id number]. Contacted patient and reviewed entire 3 mo post-liver txp education green booklet for 60+ minutes. Patient verbalized understanding of all discussed.

## 2023-06-02 MED ORDER — TACROLIMUS 1 MG CAPSULE, IMMEDIATE-RELEASE
ORAL_CAPSULE | Freq: Two times a day (BID) | ORAL | 11 refills | 30 days | Status: CP
Start: 2023-06-02 — End: ?

## 2023-06-02 NOTE — Unmapped (Signed)
[facility] Specialty and Home Delivery Pharmacy Clinical Assessment & Refill Coordination Note    Alicia Armstrong, DOB: 1976-04-08  Phone: [phone] (home) [phone] (work)    All above HIPAA information was verified with patient.     Was a Nurse, learning disability used for this call? No    Specialty Medication(s):   Transplant: mycophenolate mofetil 250mg  and tacrolimus 1mg      Current Outpatient Medications   Medication Sig Dispense Refill    acetaminophen (TYLENOL) 325 MG tablet Take 1-2 tablets (325-650 mg total) by mouth every eight (8) hours as needed for pain or fever. 60 tablet 11    amlodipine (NORVASC) 5 MG tablet Take 1 tablet (5 mg total) by mouth daily. 30 tablet 11    aspirin (ECOTRIN) 81 MG tablet Take 1 tablet (81 mg total) by mouth daily. 30 tablet 11    blood sugar diagnostic (ACCU-CHEK GUIDE TEST STRIPS) Strp by Other route four (4) times a day. 100 strip 11    carvedilol (COREG) 6.25 MG tablet Take 1 tablet (6.25 mg total) by mouth two (2) times a day. 60 tablet 11    docusate sodium (COLACE) 100 MG capsule Take 1 capsule (100 mg total) by mouth two (2) times a day as needed for constipation. 60 capsule 11    gabapentin (NEURONTIN) 400 MG capsule Take 1 capsule (400 mg total) by mouth two (2) times a day. 90 capsule 11    insulin glargine (BASAGLAR, LANTUS) 100 unit/mL (3 mL) injection pen Inject 0.18 mL (18 Units total) under the skin nightly. 15 mL 3    insulin lispro (HUMALOG) 100 unit/mL injection pen Inject 4 Units under the skin Three (3) times a day before meals. Take in addition to sliding scale. Max 50 units per day 15 mL 11    lancets Misc Use to check blood sugar as directed with insulin 3 times a day & for symptoms of high or low blood sugar. 100 each 11    magnesium oxide-Mg AA chelate (MAGNESIUM, AMINO ACID CHELATE,) 133 mg Take 1 tablet by mouth two (2) times a day. 100 tablet 6    methocarbamol (ROBAXIN) 500 MG tablet Take 1 tablet (500 mg total) by mouth four (4) times a day as needed. 120 tablet 1    mycophenolate (CELLCEPT) 250 mg capsule Take 2 capsules (500 mg total) by mouth two (2) times a day. 120 capsule 11    pantoprazole (PROTONIX) 40 MG tablet Take 1 tablet (40 mg total) by mouth daily. 30 tablet 11    pen needle, diabetic 32 gauge x 5/32 (4 mm) Ndle Use with insulin up to 4 times/day as needed. 100 each 11    polyethylene glycol (MIRALAX) 17 gram packet Mix 1 packet in  4 to 8 ounces of liquid and drink daily as needed. 30 packet 11    predniSONE (DELTASONE) 5 MG tablet Take 1 tablet (5 mg total) by mouth daily. 30 tablet 5    SITagliptin phosphate (JANUVIA) 100 MG tablet Take 1 tablet (100 mg total) by mouth daily. 30 tablet 11    tacrolimus (PROGRAF) 1 MG capsule Take 5 capsules (5 mg total) by mouth in morning and take 4 capsules (4 mg total) by mouth in the evening. 270 capsule 11     No current facility-administered medications for this visit.        Changes to medications: Alicia Armstrong reports no changes at this time.    Allergies   Allergen Reactions    Penicillins  Other (See Comments)     Had a rash with penicillin when she was 47 years old. She tolerated piperacillin/tazobactam 08/25/2016-08/28/2016 with itching without rash. Tolerated Augmentin.       Changes to allergies: No    SPECIALTY MEDICATION ADHERENCE     Mycophenolate 250 mg: 17 days of medicine on hand   Tacrolimus 1 mg: 15 days of medicine on hand       Medication Adherence    Patient reported X missed doses in the last month: 0  Specialty Medication: Tacrolimus 1mg   Patient is on additional specialty medications: Yes  Additional Specialty Medications: Mycophenolate 250mg   Patient Reported Additional Medication X Missed Doses in the Last Month: 0  Patient is on more than two specialty medications: No          Specialty medication(s) dose(s) confirmed: Regimen is correct and unchanged.     Are there any concerns with adherence? No    Adherence counseling provided? Not needed    CLINICAL MANAGEMENT AND INTERVENTION      Clinical Benefit Assessment:    Do you feel the medicine is effective or helping your condition? Yes    Clinical Benefit counseling provided? Not needed    Adverse Effects Assessment:    Are you experiencing any side effects? No    Are you experiencing difficulty administering your medicine? No    Quality of Life Assessment:    Quality of Life    Rheumatology  Oncology  Dermatology  Cystic Fibrosis          How many days over the past month did your liver tranpsplant  keep you from your normal activities? For example, brushing your teeth or getting up in the morning. 0    Have you discussed this with your provider? Not needed    Acute Infection Status:    Acute infections noted within Epic:  No active infections  Patient reported infection: None    Therapy Appropriateness:    Is therapy appropriate based on current medication list, adverse reactions, adherence, clinical benefit and progress toward achieving therapeutic goals? Yes, therapy is appropriate and should be continued     DISEASE/MEDICATION-SPECIFIC INFORMATION      N/A    Solid Organ Transplant: Not Applicable    PATIENT SPECIFIC NEEDS     Does the patient have any physical, cognitive, or cultural barriers? No    Is the patient high risk? Yes, patient is taking a REMS drug. Medication is dispensed in compliance with REMS program    Did the patient require a clinical intervention? No    Does the patient require physician intervention or other additional services (i.e., nutrition, smoking cessation, social work)? No    SOCIAL DETERMINANTS OF HEALTH     At the [facility], we have learned that life circumstances - like trouble affording food, housing, utilities, or transportation can affect the health of many of our patients.   That is why we wanted to ask: are you currently experiencing any life circumstances that are negatively impacting your health and/or quality of life? Patient declined to answer    Social Determinants of Health     Food Insecurity: No Food Insecurity (02/23/2023)    Hunger Vital Sign     Worried About Running Out of Food in the Last Year: Never true     Ran Out of Food in the Last Year: Never true   Internet Connectivity: No Internet connectivity concern identified (02/23/2023)    Insurance account manager  Do you have access to internet services: Yes     How do you connect to the internet: Personal Device at home     Is your internet connection strong enough for you to watch video on your device without major problems?: Yes     Do you have enough data to get through the month?: Yes     Does at least one of the devices have a camera that you can use for video chat?: Yes   Housing/Utilities: Low Risk  (02/23/2023)    Housing/Utilities     Within the past 12 months, have you ever stayed: outside, in a car, in a tent, in an overnight shelter, or temporarily in someone else's home (i.e. couch-surfing)?: No     Are you worried about losing your housing?: No     Within the past 12 months, have you been unable to get utilities (heat, electricity) when it was really needed?: No   Tobacco Use: Low Risk  (05/18/2023)    Patient History     Smoking Tobacco Use: Never     Smokeless Tobacco Use: Never     Passive Exposure: Not on file   Transportation Needs: No Transportation Needs (02/23/2023)    PRAPARE - Transportation     Lack of Transportation (Medical): No     Lack of Transportation (Non-Medical): No   Alcohol Use: Not At Risk (02/23/2023)    Alcohol Use     How often do you have a drink containing alcohol?: Never     How many drinks containing alcohol do you have on a typical day when you are drinking?: 1 - 2     How often do you have 5 or more drinks on one occasion?: Never   Interpersonal Safety: Not At Risk (02/23/2023)    Interpersonal Safety     Unsafe Where You Currently Live: No     Physically Hurt by Anyone: No     Abused by Anyone: No   Physical Activity: Inactive (02/23/2023)    Exercise Vital Sign     Days of Exercise per Week: 0 days     Minutes of Exercise per Session: 0 min   Intimate Partner Violence: Not At Risk (02/23/2023)    Humiliation, Afraid, Rape, and Kick questionnaire     Fear of Current or Ex-Partner: No     Emotionally Abused: No     Physically Abused: No     Sexually Abused: No   Stress: No Stress Concern Present (02/23/2023)    [employer] of Occupational Health - Occupational Stress Questionnaire     Feeling of Stress : Not at all   Substance Use: Low Risk  (02/23/2023)    Substance Use     Taken prescription drugs for non-medical reasons: Never     Taken illegal drugs: Never     Patient indicated they have taken drugs in the past year for non-medical reasons: Yes, [positive answer(s)]: Not on file   Social Connections: Socially Integrated (02/23/2023)    Social Connection and Isolation Panel [NHANES]     Frequency of Communication with Friends and Family: More than three times a week     Frequency of Social Gatherings with Friends and Family: More than three times a week     Attends Religious Services: More than 4 times per year     Active Member of [employer] or Organizations: Yes     Attends Banker Meetings: More than 4 times per year  Marital Status: Married   Programmer, applications: Low Risk  (02/23/2023)    Overall Financial Resource Strain (CARDIA)     Difficulty of Paying Living Expenses: Not very hard   Depression: Not at risk (02/23/2023)    PHQ-2     PHQ-2 Score: 0   Health Literacy: Medium Risk (02/23/2023)    Health Literacy     : Sometimes       Would you be willing to receive help with any of the needs that you have identified today? Not applicable       SHIPPING     Specialty Medication(s) to be Shipped:   Transplant: None- patient declined tacrolimus and mycophenolate today.    Other medication(s) to be shipped:  amlodipine, carvedilol     Changes to insurance: No    Delivery Scheduled: Yes, Expected medication delivery date: 06/04/23.     Medication will be delivered via UPS to the confirmed prescription address in [facility].    The patient will receive a drug information handout for each medication shipped and additional FDA Medication Guides as required.  Verified that patient has previously received a Conservation officer, historic buildings and a Surveyor, mining.    The patient or caregiver noted above participated in the development of this care plan and knows that they can request review of or adjustments to the care plan at any time.      All of the patient's questions and concerns have been addressed.    Tera Helper, [facility]   [facility] Specialty and Home Delivery Pharmacy Specialty Pharmacist

## 2023-06-02 NOTE — Unmapped (Signed)
Patient's 10/7 Tac level at 9.3. Reviewed with PharmD Chargualaf, who recommended reducing her dose to 4mg  bid.     Spoke with patient, who confirmed the level was reliable and that she had been taking the correct dosage. Relayed dose change. She verbalized understanding.     Inquired if she has been monitoring her bp at home. She said she has not yet this week, but did so up until last week, and it has been in the 120-130/70-80 range.

## 2023-06-03 LAB — COMPREHENSIVE METABOLIC PANEL
ALBUMIN: 4.4 g/dL (ref 3.9–4.9)
ALKALINE PHOSPHATASE: 89 IU/L (ref 44–121)
ALT (SGPT): 16 IU/L (ref 0–32)
AST (SGOT): 13 IU/L (ref 0–40)
BILIRUBIN TOTAL (MG/DL) IN SER/PLAS: 0.4 mg/dL (ref 0.0–1.2)
BLOOD UREA NITROGEN: 20 mg/dL (ref 6–24)
BUN / CREAT RATIO: 24 — ABNORMAL HIGH (ref 9–23)
CALCIUM: 9.1 mg/dL (ref 8.7–10.2)
CHLORIDE: 104 mmol/L (ref 96–106)
CO2: 22 mmol/L (ref 20–29)
CREATININE: 0.82 mg/dL (ref 0.57–1.00)
EGFR: 89 mL/min/{1.73_m2}
GLOBULIN, TOTAL: 1.9 g/dL (ref 1.5–4.5)
GLUCOSE: 126 mg/dL — ABNORMAL HIGH (ref 70–99)
POTASSIUM: 4.5 mmol/L (ref 3.5–5.2)
SODIUM: 141 mmol/L (ref 134–144)
TOTAL PROTEIN: 6.3 g/dL (ref 6.0–8.5)

## 2023-06-03 LAB — CBC W/ DIFFERENTIAL
BANDED NEUTROPHILS ABSOLUTE COUNT: 0 10*3/uL (ref 0.0–0.1)
BASOPHILS ABSOLUTE COUNT: 0 10*3/uL (ref 0.0–0.2)
BASOPHILS RELATIVE PERCENT: 0 %
EOSINOPHILS ABSOLUTE COUNT: 0.1 10*3/uL (ref 0.0–0.4)
EOSINOPHILS RELATIVE PERCENT: 3 %
HEMATOCRIT: 38.9 % (ref 34.0–46.6)
HEMOGLOBIN: 12.4 g/dL (ref 11.1–15.9)
IMMATURE GRANULOCYTES: 0 %
LYMPHOCYTES ABSOLUTE COUNT: 0.7 10*3/uL (ref 0.7–3.1)
LYMPHOCYTES RELATIVE PERCENT: 22 %
MEAN CORPUSCULAR HEMOGLOBIN CONC: 31.9 g/dL (ref 31.5–35.7)
MEAN CORPUSCULAR HEMOGLOBIN: 28.5 pg (ref 26.6–33.0)
MEAN CORPUSCULAR VOLUME: 89 fL (ref 79–97)
MONOCYTES ABSOLUTE COUNT: 0.3 10*3/uL (ref 0.1–0.9)
MONOCYTES RELATIVE PERCENT: 8 %
NEUTROPHILS ABSOLUTE COUNT: 2.3 10*3/uL (ref 1.4–7.0)
NEUTROPHILS RELATIVE PERCENT: 67 %
PLATELET COUNT: 111 10*3/uL — ABNORMAL LOW (ref 150–450)
RED BLOOD CELL COUNT: 4.35 x10E6/uL (ref 3.77–5.28)
RED CELL DISTRIBUTION WIDTH: 14.1 % (ref 11.7–15.4)
WHITE BLOOD CELL COUNT: 3.4 10*3/uL (ref 3.4–10.8)

## 2023-06-03 LAB — PHOSPHORUS: PHOSPHORUS, SERUM: 3.4 mg/dL (ref 3.0–4.3)

## 2023-06-03 LAB — BILIRUBIN, DIRECT: BILIRUBIN DIRECT: 0.14 mg/dL (ref 0.00–0.40)

## 2023-06-03 LAB — MAGNESIUM: MAGNESIUM: 1.8 mg/dL (ref 1.6–2.3)

## 2023-06-03 LAB — GAMMA GT: GAMMA GLUTAMYL TRANSFERASE: 13 IU/L (ref 0–60)

## 2023-06-03 MED FILL — AMLODIPINE 5 MG TABLET: ORAL | 30 days supply | Qty: 30 | Fill #1

## 2023-06-03 MED FILL — CARVEDILOL 6.25 MG TABLET: ORAL | 30 days supply | Qty: 60 | Fill #1

## 2023-06-04 DIAGNOSIS — Z796 Long-term use of immunosuppressant medication: Principal | ICD-10-CM

## 2023-06-04 DIAGNOSIS — Z944 Liver transplant status: Principal | ICD-10-CM

## 2023-06-04 LAB — TACROLIMUS LEVEL: TACROLIMUS BLOOD: 10.1 ng/mL (ref 2.0–20.0)

## 2023-06-08 LAB — COMPREHENSIVE METABOLIC PANEL
ALBUMIN: 4.3 g/dL (ref 3.9–4.9)
ALKALINE PHOSPHATASE: 107 IU/L (ref 44–121)
ALT (SGPT): 15 IU/L (ref 0–32)
AST (SGOT): 14 IU/L (ref 0–40)
BILIRUBIN TOTAL (MG/DL) IN SER/PLAS: 0.3 mg/dL (ref 0.0–1.2)
BLOOD UREA NITROGEN: 20 mg/dL (ref 6–24)
BUN / CREAT RATIO: 24 — ABNORMAL HIGH (ref 9–23)
CALCIUM: 8.8 mg/dL (ref 8.7–10.2)
CHLORIDE: 106 mmol/L (ref 96–106)
CO2: 20 mmol/L (ref 20–29)
CREATININE: 0.84 mg/dL (ref 0.57–1.00)
EGFR: 87 mL/min/{1.73_m2}
GLOBULIN, TOTAL: 1.9 g/dL (ref 1.5–4.5)
GLUCOSE: 156 mg/dL — ABNORMAL HIGH (ref 70–99)
POTASSIUM: 4.6 mmol/L (ref 3.5–5.2)
SODIUM: 141 mmol/L (ref 134–144)
TOTAL PROTEIN: 6.2 g/dL (ref 6.0–8.5)

## 2023-06-08 LAB — GAMMA GT: GAMMA GLUTAMYL TRANSFERASE: 14 IU/L (ref 0–60)

## 2023-06-08 LAB — CBC W/ DIFFERENTIAL
BANDED NEUTROPHILS ABSOLUTE COUNT: 0 10*3/uL (ref 0.0–0.1)
BASOPHILS ABSOLUTE COUNT: 0 10*3/uL (ref 0.0–0.2)
BASOPHILS RELATIVE PERCENT: 1 %
EOSINOPHILS ABSOLUTE COUNT: 0.1 10*3/uL (ref 0.0–0.4)
EOSINOPHILS RELATIVE PERCENT: 3 %
HEMATOCRIT: 40.8 % (ref 34.0–46.6)
HEMOGLOBIN: 13.1 g/dL (ref 11.1–15.9)
IMMATURE GRANULOCYTES: 0 %
LYMPHOCYTES ABSOLUTE COUNT: 0.7 10*3/uL (ref 0.7–3.1)
LYMPHOCYTES RELATIVE PERCENT: 22 %
MEAN CORPUSCULAR HEMOGLOBIN CONC: 32.1 g/dL (ref 31.5–35.7)
MEAN CORPUSCULAR HEMOGLOBIN: 28.4 pg (ref 26.6–33.0)
MEAN CORPUSCULAR VOLUME: 89 fL (ref 79–97)
MONOCYTES ABSOLUTE COUNT: 0.3 10*3/uL (ref 0.1–0.9)
MONOCYTES RELATIVE PERCENT: 9 %
NEUTROPHILS ABSOLUTE COUNT: 2.2 10*3/uL (ref 1.4–7.0)
NEUTROPHILS RELATIVE PERCENT: 65 %
PLATELET COUNT: 107 10*3/uL — ABNORMAL LOW (ref 150–450)
RED BLOOD CELL COUNT: 4.61 x10E6/uL (ref 3.77–5.28)
RED CELL DISTRIBUTION WIDTH: 13.8 % (ref 11.7–15.4)
WHITE BLOOD CELL COUNT: 3.3 10*3/uL — ABNORMAL LOW (ref 3.4–10.8)

## 2023-06-08 LAB — TACROLIMUS LEVEL: TACROLIMUS BLOOD: 11.4 ng/mL (ref 2.0–20.0)

## 2023-06-08 LAB — PHOSPHORUS: PHOSPHORUS, SERUM: 3 mg/dL (ref 3.0–4.3)

## 2023-06-08 LAB — MAGNESIUM: MAGNESIUM: 1.9 mg/dL (ref 1.6–2.3)

## 2023-06-08 LAB — BILIRUBIN, DIRECT: BILIRUBIN DIRECT: 0.12 mg/dL (ref 0.00–0.40)

## 2023-06-09 DIAGNOSIS — Z796 Long-term use of immunosuppressant medication: Principal | ICD-10-CM

## 2023-06-09 DIAGNOSIS — Z944 Liver transplant status: Principal | ICD-10-CM

## 2023-06-09 MED ORDER — TACROLIMUS 1 MG CAPSULE, IMMEDIATE-RELEASE
ORAL_CAPSULE | Freq: Two times a day (BID) | ORAL | 11 refills | 30 days | Status: CP
Start: 2023-06-09 — End: ?

## 2023-06-09 NOTE — Unmapped (Signed)
Her Tac dose was reduced on 10/9 to 4 bid, and her repeat Tac on 10/14 resulted at 11.4. Discussed with PharmD Chargualaf, who recommended her dose be reduced to 3mg  bid. Spoke with patient, who confirmed that the level was reliable and said she had been taking 4mg  bid. Relayed dose adjustment and instructed her to adjust her pill box and med sheet. She verbalized understanding.

## 2023-06-09 NOTE — Unmapped (Addendum)
[facility] Pharmacist has reviewed a new prescription for tacrolimus that indicates a dose  decrease from our last fill, increase from current dose per epic notes .  Patient was counseled on this dosage change by coordinator [facility]- see epic note from 10/9.  Next refill call date adjusted if necessary.        Clinical Assessment Needed For: Dose Change  Medication: tacrolimus 1 MG capsule (PROGRAF)  Last Fill Date/Day Supply: 05/20/2023 / 30  Refill Too Soon until 06/11/2023  Was previous dose already scheduled to fill: No    Notes to Pharmacist:

## 2023-06-10 LAB — COMPREHENSIVE METABOLIC PANEL
ALBUMIN: 4.4 g/dL (ref 3.9–4.9)
ALKALINE PHOSPHATASE: 100 IU/L (ref 44–121)
ALT (SGPT): 20 IU/L (ref 0–32)
AST (SGOT): 17 IU/L (ref 0–40)
BILIRUBIN TOTAL (MG/DL) IN SER/PLAS: 0.3 mg/dL (ref 0.0–1.2)
BLOOD UREA NITROGEN: 18 mg/dL (ref 6–24)
BUN / CREAT RATIO: 21 (ref 9–23)
CALCIUM: 9 mg/dL (ref 8.7–10.2)
CHLORIDE: 104 mmol/L (ref 96–106)
CO2: 22 mmol/L (ref 20–29)
CREATININE: 0.87 mg/dL (ref 0.57–1.00)
EGFR: 83 mL/min/{1.73_m2}
GLOBULIN, TOTAL: 1.8 g/dL (ref 1.5–4.5)
GLUCOSE: 152 mg/dL — ABNORMAL HIGH (ref 70–99)
POTASSIUM: 4.3 mmol/L (ref 3.5–5.2)
SODIUM: 140 mmol/L (ref 134–144)
TOTAL PROTEIN: 6.2 g/dL (ref 6.0–8.5)

## 2023-06-10 LAB — CBC W/ DIFFERENTIAL
BANDED NEUTROPHILS ABSOLUTE COUNT: 0 10*3/uL (ref 0.0–0.1)
BASOPHILS ABSOLUTE COUNT: 0 10*3/uL (ref 0.0–0.2)
BASOPHILS RELATIVE PERCENT: 1 %
EOSINOPHILS ABSOLUTE COUNT: 0.1 10*3/uL (ref 0.0–0.4)
EOSINOPHILS RELATIVE PERCENT: 4 %
HEMATOCRIT: 40.5 % (ref 34.0–46.6)
HEMOGLOBIN: 12.9 g/dL (ref 11.1–15.9)
IMMATURE GRANULOCYTES: 1 %
LYMPHOCYTES ABSOLUTE COUNT: 0.8 10*3/uL (ref 0.7–3.1)
LYMPHOCYTES RELATIVE PERCENT: 23 %
MEAN CORPUSCULAR HEMOGLOBIN CONC: 31.9 g/dL (ref 31.5–35.7)
MEAN CORPUSCULAR HEMOGLOBIN: 27.8 pg (ref 26.6–33.0)
MEAN CORPUSCULAR VOLUME: 87 fL (ref 79–97)
MONOCYTES ABSOLUTE COUNT: 0.3 10*3/uL (ref 0.1–0.9)
MONOCYTES RELATIVE PERCENT: 8 %
NEUTROPHILS ABSOLUTE COUNT: 2.2 10*3/uL (ref 1.4–7.0)
NEUTROPHILS RELATIVE PERCENT: 63 %
PLATELET COUNT: 118 10*3/uL — ABNORMAL LOW (ref 150–450)
RED BLOOD CELL COUNT: 4.64 x10E6/uL (ref 3.77–5.28)
RED CELL DISTRIBUTION WIDTH: 13.7 % (ref 11.7–15.4)
WHITE BLOOD CELL COUNT: 3.5 10*3/uL (ref 3.4–10.8)

## 2023-06-10 LAB — PHOSPHORUS: PHOSPHORUS, SERUM: 3.1 mg/dL (ref 3.0–4.3)

## 2023-06-10 LAB — BILIRUBIN, DIRECT: BILIRUBIN DIRECT: 0.14 mg/dL (ref 0.00–0.40)

## 2023-06-10 LAB — GAMMA GT: GAMMA GLUTAMYL TRANSFERASE: 15 IU/L (ref 0–60)

## 2023-06-10 LAB — MAGNESIUM: MAGNESIUM: 1.8 mg/dL (ref 1.6–2.3)

## 2023-06-11 LAB — TACROLIMUS LEVEL: TACROLIMUS BLOOD: 13 ng/mL (ref 2.0–20.0)

## 2023-06-15 LAB — COMPREHENSIVE METABOLIC PANEL
ALBUMIN: 4.4 g/dL (ref 3.9–4.9)
ALKALINE PHOSPHATASE: 94 IU/L (ref 44–121)
ALT (SGPT): 16 IU/L (ref 0–32)
AST (SGOT): 14 IU/L (ref 0–40)
BILIRUBIN TOTAL (MG/DL) IN SER/PLAS: 0.3 mg/dL (ref 0.0–1.2)
BLOOD UREA NITROGEN: 14 mg/dL (ref 6–24)
BUN / CREAT RATIO: 21 (ref 9–23)
CALCIUM: 8.6 mg/dL — ABNORMAL LOW (ref 8.7–10.2)
CHLORIDE: 105 mmol/L (ref 96–106)
CO2: 22 mmol/L (ref 20–29)
CREATININE: 0.67 mg/dL (ref 0.57–1.00)
EGFR: 108 mL/min/{1.73_m2}
GLOBULIN, TOTAL: 1.6 g/dL (ref 1.5–4.5)
GLUCOSE: 137 mg/dL — ABNORMAL HIGH (ref 70–99)
POTASSIUM: 4.1 mmol/L (ref 3.5–5.2)
SODIUM: 142 mmol/L (ref 134–144)
TOTAL PROTEIN: 6 g/dL (ref 6.0–8.5)

## 2023-06-15 LAB — MAGNESIUM: MAGNESIUM: 1.7 mg/dL (ref 1.6–2.3)

## 2023-06-15 LAB — CBC W/ DIFFERENTIAL
BANDED NEUTROPHILS ABSOLUTE COUNT: 0 10*3/uL (ref 0.0–0.1)
BASOPHILS ABSOLUTE COUNT: 0 10*3/uL (ref 0.0–0.2)
BASOPHILS RELATIVE PERCENT: 1 %
EOSINOPHILS ABSOLUTE COUNT: 0.1 10*3/uL (ref 0.0–0.4)
EOSINOPHILS RELATIVE PERCENT: 3 %
HEMATOCRIT: 39.9 % (ref 34.0–46.6)
HEMOGLOBIN: 12.8 g/dL (ref 11.1–15.9)
IMMATURE GRANULOCYTES: 0 %
LYMPHOCYTES ABSOLUTE COUNT: 0.7 10*3/uL (ref 0.7–3.1)
LYMPHOCYTES RELATIVE PERCENT: 23 %
MEAN CORPUSCULAR HEMOGLOBIN CONC: 32.1 g/dL (ref 31.5–35.7)
MEAN CORPUSCULAR HEMOGLOBIN: 28 pg (ref 26.6–33.0)
MEAN CORPUSCULAR VOLUME: 87 fL (ref 79–97)
MONOCYTES ABSOLUTE COUNT: 0.2 10*3/uL (ref 0.1–0.9)
MONOCYTES RELATIVE PERCENT: 7 %
NEUTROPHILS ABSOLUTE COUNT: 1.9 10*3/uL (ref 1.4–7.0)
NEUTROPHILS RELATIVE PERCENT: 66 %
PLATELET COUNT: 100 10*3/uL — CL (ref 150–450)
RED BLOOD CELL COUNT: 4.57 x10E6/uL (ref 3.77–5.28)
RED CELL DISTRIBUTION WIDTH: 13.5 % (ref 11.7–15.4)
WHITE BLOOD CELL COUNT: 2.9 10*3/uL — ABNORMAL LOW (ref 3.4–10.8)

## 2023-06-15 LAB — GAMMA GT: GAMMA GLUTAMYL TRANSFERASE: 13 IU/L (ref 0–60)

## 2023-06-15 LAB — PHOSPHORUS: PHOSPHORUS, SERUM: 3.5 mg/dL (ref 3.0–4.3)

## 2023-06-15 LAB — BILIRUBIN, DIRECT: BILIRUBIN DIRECT: <0.1 mg/dL (ref 0.00–0.40)

## 2023-06-16 LAB — TACROLIMUS LEVEL: TACROLIMUS BLOOD: 7.4 ng/mL (ref 2.0–20.0)

## 2023-06-17 ENCOUNTER — Institutional Professional Consult (permissible substitution): Admit: 2023-06-17 | Discharge: 2023-06-18 | Payer: PRIVATE HEALTH INSURANCE

## 2023-06-17 DIAGNOSIS — Z944 Liver transplant status: Principal | ICD-10-CM

## 2023-06-17 DIAGNOSIS — D702 Other drug-induced agranulocytosis: Principal | ICD-10-CM

## 2023-06-17 MED ADMIN — albuterol 2.5 mg /3 mL (0.083 %) nebulizer solution 2.5 mg: 2.5 mg | RESPIRATORY_TRACT | @ 17:00:00 | Stop: 2023-06-17

## 2023-06-17 MED ADMIN — pentamidine (PENTAM) inhalation solution: 300 mg | RESPIRATORY_TRACT | @ 17:00:00 | Stop: 2023-06-17

## 2023-06-17 NOTE — Unmapped (Signed)
 Per providers orders, Albuterol and Pentamidine treatments were administered.  Patient tolerated it well with no complication.  See MAR for administration info.

## 2023-06-21 DIAGNOSIS — Z944 Liver transplant status: Principal | ICD-10-CM

## 2023-06-21 DIAGNOSIS — E559 Vitamin D deficiency, unspecified: Principal | ICD-10-CM

## 2023-06-21 DIAGNOSIS — E612 Magnesium deficiency: Principal | ICD-10-CM

## 2023-06-21 DIAGNOSIS — Z1389 Encounter for screening for other disorder: Principal | ICD-10-CM

## 2023-06-21 DIAGNOSIS — R739 Hyperglycemia, unspecified: Principal | ICD-10-CM

## 2023-06-21 DIAGNOSIS — Z5181 Encounter for therapeutic drug level monitoring: Principal | ICD-10-CM

## 2023-06-21 DIAGNOSIS — E789 Disorder of lipoprotein metabolism, unspecified: Principal | ICD-10-CM

## 2023-06-21 NOTE — Unmapped (Signed)
Patient called to report that there were no more lab orders available at [facility]. Placed new standing orders and additional orders to monitor her cholesterol. Let patient know about add'l orders and instructed her to remain NPO for 6 hrs before labs. She verbalized understanding.

## 2023-06-22 LAB — COMPREHENSIVE METABOLIC PANEL
ALBUMIN: 4.5 g/dL (ref 3.9–4.9)
ALKALINE PHOSPHATASE: 90 IU/L (ref 44–121)
ALT (SGPT): 16 IU/L (ref 0–32)
AST (SGOT): 17 IU/L (ref 0–40)
BILIRUBIN TOTAL (MG/DL) IN SER/PLAS: 0.4 mg/dL (ref 0.0–1.2)
BLOOD UREA NITROGEN: 13 mg/dL (ref 6–24)
BUN / CREAT RATIO: 19 (ref 9–23)
CALCIUM: 9 mg/dL (ref 8.7–10.2)
CHLORIDE: 105 mmol/L (ref 96–106)
CO2: 26 mmol/L (ref 20–29)
CREATININE: 0.68 mg/dL (ref 0.57–1.00)
EGFR: 108 mL/min/{1.73_m2}
GLOBULIN, TOTAL: 1.3 g/dL — ABNORMAL LOW (ref 1.5–4.5)
GLUCOSE: 148 mg/dL — ABNORMAL HIGH (ref 70–99)
POTASSIUM: 4.1 mmol/L (ref 3.5–5.2)
SODIUM: 143 mmol/L (ref 134–144)
TOTAL PROTEIN: 5.8 g/dL — ABNORMAL LOW (ref 6.0–8.5)

## 2023-06-22 LAB — CBC W/ DIFFERENTIAL
BANDED NEUTROPHILS ABSOLUTE COUNT: 0 10*3/uL (ref 0.0–0.1)
BASOPHILS ABSOLUTE COUNT: 0 10*3/uL (ref 0.0–0.2)
BASOPHILS RELATIVE PERCENT: 0 %
EOSINOPHILS ABSOLUTE COUNT: 0.1 10*3/uL (ref 0.0–0.4)
EOSINOPHILS RELATIVE PERCENT: 4 %
HEMATOCRIT: 39.1 % (ref 34.0–46.6)
HEMOGLOBIN: 12.8 g/dL (ref 11.1–15.9)
IMMATURE GRANULOCYTES: 0 %
LYMPHOCYTES ABSOLUTE COUNT: 0.7 10*3/uL (ref 0.7–3.1)
LYMPHOCYTES RELATIVE PERCENT: 23 %
MEAN CORPUSCULAR HEMOGLOBIN CONC: 32.7 g/dL (ref 31.5–35.7)
MEAN CORPUSCULAR HEMOGLOBIN: 28.6 pg (ref 26.6–33.0)
MEAN CORPUSCULAR VOLUME: 87 fL (ref 79–97)
MONOCYTES ABSOLUTE COUNT: 0.3 10*3/uL (ref 0.1–0.9)
MONOCYTES RELATIVE PERCENT: 9 %
NEUTROPHILS ABSOLUTE COUNT: 1.9 10*3/uL (ref 1.4–7.0)
NEUTROPHILS RELATIVE PERCENT: 64 %
PLATELET COUNT: 91 10*3/uL — CL (ref 150–450)
RED BLOOD CELL COUNT: 4.48 x10E6/uL (ref 3.77–5.28)
RED CELL DISTRIBUTION WIDTH: 13.6 % (ref 11.7–15.4)
WHITE BLOOD CELL COUNT: 3 10*3/uL — ABNORMAL LOW (ref 3.4–10.8)

## 2023-06-22 LAB — TACROLIMUS LEVEL: TACROLIMUS BLOOD: 5.9 ng/mL (ref 2.0–20.0)

## 2023-06-22 LAB — GAMMA GT: GAMMA GLUTAMYL TRANSFERASE: 15 IU/L (ref 0–60)

## 2023-06-22 LAB — MAGNESIUM: MAGNESIUM: 1.8 mg/dL (ref 1.6–2.3)

## 2023-06-22 LAB — PHOSPHORUS: PHOSPHORUS, SERUM: 3.2 mg/dL (ref 3.0–4.3)

## 2023-06-22 LAB — BILIRUBIN, DIRECT: BILIRUBIN DIRECT: 0.14 mg/dL (ref 0.00–0.40)

## 2023-06-23 LAB — LIPID PANEL
CHOLESTEROL, TOTAL: 164 mg/dL (ref 100–199)
HDL CHOLESTEROL: 49 mg/dL
LDL CHOLESTEROL CALCULATED: 100 mg/dL — ABNORMAL HIGH (ref 0–99)
LDL/HDL RATIO: 2 ratio (ref 0.0–3.2)
TRIGLYCERIDES: 77 mg/dL (ref 0–149)
VLDL CHOLESTEROL CAL: 15 mg/dL (ref 5–40)

## 2023-06-23 LAB — CBC W/ DIFFERENTIAL
BANDED NEUTROPHILS ABSOLUTE COUNT: 0 10*3/uL (ref 0.0–0.1)
BASOPHILS ABSOLUTE COUNT: 0 10*3/uL (ref 0.0–0.2)
BASOPHILS RELATIVE PERCENT: 0 %
EOSINOPHILS ABSOLUTE COUNT: 0.1 10*3/uL (ref 0.0–0.4)
EOSINOPHILS RELATIVE PERCENT: 4 %
HEMATOCRIT: 40.1 % (ref 34.0–46.6)
HEMOGLOBIN: 13 g/dL (ref 11.1–15.9)
IMMATURE GRANULOCYTES: 0 %
LYMPHOCYTES ABSOLUTE COUNT: 0.7 10*3/uL (ref 0.7–3.1)
LYMPHOCYTES RELATIVE PERCENT: 24 %
MEAN CORPUSCULAR HEMOGLOBIN CONC: 32.4 g/dL (ref 31.5–35.7)
MEAN CORPUSCULAR HEMOGLOBIN: 27.7 pg (ref 26.6–33.0)
MEAN CORPUSCULAR VOLUME: 86 fL (ref 79–97)
MONOCYTES ABSOLUTE COUNT: 0.3 10*3/uL (ref 0.1–0.9)
MONOCYTES RELATIVE PERCENT: 9 %
NEUTROPHILS ABSOLUTE COUNT: 1.7 10*3/uL (ref 1.4–7.0)
NEUTROPHILS RELATIVE PERCENT: 63 %
PLATELET COUNT: 99 10*3/uL — CL (ref 150–450)
RED BLOOD CELL COUNT: 4.69 x10E6/uL (ref 3.77–5.28)
RED CELL DISTRIBUTION WIDTH: 13.1 % (ref 11.7–15.4)
WHITE BLOOD CELL COUNT: 2.8 10*3/uL — ABNORMAL LOW (ref 3.4–10.8)

## 2023-06-23 LAB — COMPREHENSIVE METABOLIC PANEL
ALBUMIN: 4.5 g/dL (ref 3.9–4.9)
ALKALINE PHOSPHATASE: 96 IU/L (ref 44–121)
ALT (SGPT): 16 IU/L (ref 0–32)
AST (SGOT): 15 IU/L (ref 0–40)
BILIRUBIN TOTAL (MG/DL) IN SER/PLAS: 0.4 mg/dL (ref 0.0–1.2)
BLOOD UREA NITROGEN: 16 mg/dL (ref 6–24)
BUN / CREAT RATIO: 22 (ref 9–23)
CALCIUM: 8.9 mg/dL (ref 8.7–10.2)
CHLORIDE: 104 mmol/L (ref 96–106)
CO2: 25 mmol/L (ref 20–29)
CREATININE: 0.74 mg/dL (ref 0.57–1.00)
EGFR: 100 mL/min/{1.73_m2}
GLOBULIN, TOTAL: 1.6 g/dL (ref 1.5–4.5)
GLUCOSE: 143 mg/dL — ABNORMAL HIGH (ref 70–99)
POTASSIUM: 4.2 mmol/L (ref 3.5–5.2)
SODIUM: 141 mmol/L (ref 134–144)
TOTAL PROTEIN: 6.1 g/dL (ref 6.0–8.5)

## 2023-06-23 LAB — VITAMIN D 25 HYDROXY: VITAMIN D 25-HYDROXY: 33.2 ng/mL (ref 30.0–100.0)

## 2023-06-23 LAB — MAGNESIUM: MAGNESIUM: 1.9 mg/dL (ref 1.6–2.3)

## 2023-06-23 LAB — GAMMA GT: GAMMA GLUTAMYL TRANSFERASE: 16 IU/L (ref 0–60)

## 2023-06-23 LAB — PHOSPHORUS: PHOSPHORUS, SERUM: 3.6 mg/dL (ref 3.0–4.3)

## 2023-06-23 LAB — HEMOGLOBIN A1C: HEMOGLOBIN A1C: 6 % — ABNORMAL HIGH (ref 4.8–5.6)

## 2023-06-23 LAB — BILIRUBIN, DIRECT: BILIRUBIN DIRECT: 0.13 mg/dL (ref 0.00–0.40)

## 2023-06-24 DIAGNOSIS — E612 Magnesium deficiency: Principal | ICD-10-CM

## 2023-06-24 DIAGNOSIS — Z944 Liver transplant status: Principal | ICD-10-CM

## 2023-06-24 DIAGNOSIS — Z5181 Encounter for therapeutic drug level monitoring: Principal | ICD-10-CM

## 2023-06-24 DIAGNOSIS — Z796 Long-term use of immunosuppressant medication: Principal | ICD-10-CM

## 2023-06-24 LAB — TACROLIMUS LEVEL: TACROLIMUS BLOOD: 5.1 ng/mL (ref 2.0–20.0)

## 2023-06-24 MED ORDER — TACROLIMUS 1 MG CAPSULE, IMMEDIATE-RELEASE
ORAL_CAPSULE | ORAL | 11 refills | 30 days | Status: CP
Start: 2023-06-24 — End: ?
  Filled 2023-06-29: qty 210, 30d supply, fill #0

## 2023-06-24 NOTE — Unmapped (Addendum)
Patient's last two Tac levels have been subpar (5.9 and now 5.1) on the 3mg  bid dose. Reached out to PharmD Chargualaf, who recommended increasing her dose to 4mg /3mg  now.        Spoke to patient and relayed recommendation, letting her know she could take 1 extra 1mg  cap now. She verbalized understanding and agreed to write new Rx on her med list. She will repeat labs at [facility] next week. New standing [facility] orders placed.

## 2023-06-24 NOTE — Unmapped (Addendum)
[facility] Pharmacist has reviewed a new prescription for tacrolimus that indicates a dose increase.  Patient was counseled on this dosage change by coordinator [facility]- see epic note from 10/31.  Next refill call date adjusted if necessary.        Clinical Assessment Needed For: Dose Change  Medication: Tacrolimus 1mg  capsule  Last Fill Date/Day Supply: 05/20/2023 / 30 days  Copay $0  Was previous dose already scheduled to fill: No    Notes to Pharmacist: N/A

## 2023-06-26 LAB — TACROLIMUS LEVEL: TACROLIMUS BLOOD: 5.4 ng/mL (ref 2.0–20.0)

## 2023-06-27 LAB — PHOSPHATIDYLETHANOL (PETH): PHOSPHATIDYLETHANOL (PETH): NEGATIVE ng/mL

## 2023-06-28 DIAGNOSIS — Z944 Liver transplant status: Principal | ICD-10-CM

## 2023-06-28 DIAGNOSIS — Z5181 Encounter for therapeutic drug level monitoring: Principal | ICD-10-CM

## 2023-06-28 DIAGNOSIS — Z796 Long-term use of immunosuppressant medication: Principal | ICD-10-CM

## 2023-06-28 DIAGNOSIS — E612 Magnesium deficiency: Principal | ICD-10-CM

## 2023-06-28 LAB — CBC W/ DIFFERENTIAL
BASOPHILS ABSOLUTE COUNT: 0 10*9/L
BASOPHILS RELATIVE PERCENT: 0 %
EOSINOPHILS ABSOLUTE COUNT: 0.1 10*9/L
EOSINOPHILS RELATIVE PERCENT: 5 %
HEMATOCRIT: 38.8 %
HEMOGLOBIN: 12.4 g/dL
LYMPHOCYTES ABSOLUTE COUNT: 0.7 10*9/L
LYMPHOCYTES RELATIVE PERCENT: 27 %
MEAN CORPUSCULAR HEMOGLOBIN CONC: 32 g/dL
MEAN CORPUSCULAR HEMOGLOBIN: 27.5 pg
MEAN CORPUSCULAR VOLUME: 86 fL
MONOCYTES ABSOLUTE COUNT: 0.3 10*9/L
MONOCYTES RELATIVE PERCENT: 9 %
NEUTROPHILS ABSOLUTE COUNT: 1.5 10*9/L
NEUTROPHILS RELATIVE PERCENT: 59 %
PLATELET COUNT: 102 10*9/L — ABNORMAL LOW
RED BLOOD CELL COUNT: 4.51 10*12/L
RED CELL DISTRIBUTION WIDTH: 13.3 %
WHITE BLOOD CELL COUNT: 2.7 10*9/L — ABNORMAL LOW

## 2023-06-28 LAB — VITAMIN D 25 HYDROXY: VITAMIN D 25-HYDROXY: 27.2 — ABNORMAL LOW

## 2023-06-28 LAB — LIPID PANEL
CHOLESTEROL: 158 mg/dL
HDL CHOLESTEROL: 46 mg/dL
LDL CHOLESTEROL CALCULATED: 98 mg/dL
NON-HDL CHOLESTEROL: 2.1 mg/dL
TRIGLYCERIDES: 71 mg/dL

## 2023-06-28 LAB — COMPREHENSIVE METABOLIC PANEL
ALBUMIN: 4.2
ALKALINE PHOSPHATASE: 89 U/L
ALT (SGPT): 18 U/L
AST (SGOT): 15 U/L
BILIRUBIN TOTAL: 0.3 mg/dL
BLOOD UREA NITROGEN: 14 mg/dL
CALCIUM: 8.8 mg/dL
CHLORIDE: 104 mmol/L
CO2: 25 mmol/L
CREATININE: 0.72 mg/dL
EGFR CKD-EPI NON-AA FEMALE: 104 mL/min/{1.73_m2}
GLUCOSE RANDOM: 133 mg/dL — ABNORMAL HIGH
POTASSIUM: 4.1 mmol/L
PROTEIN TOTAL: 6 g/dL
SODIUM: 143 mmol/L

## 2023-06-28 LAB — GAMMA GT: GAMMA GLUTAMYL TRANSFERASE: 16 U/L

## 2023-06-28 LAB — MAGNESIUM: MAGNESIUM: 1.8 mg/dL

## 2023-06-28 LAB — HEMOGLOBIN A1C: HEMOGLOBIN A1C: 6.1 % — ABNORMAL HIGH

## 2023-06-28 LAB — PHOSPHORUS: PHOSPHORUS: 3.3 mg/dL

## 2023-06-28 LAB — BILIRUBIN, DIRECT: BILIRUBIN DIRECT: 0.11 mg/dL

## 2023-06-29 DIAGNOSIS — Z944 Liver transplant status: Principal | ICD-10-CM

## 2023-06-29 DIAGNOSIS — D702 Other drug-induced agranulocytosis: Principal | ICD-10-CM

## 2023-06-29 MED FILL — PREDNISONE 5 MG TABLET: ORAL | 30 days supply | Qty: 30 | Fill #0

## 2023-06-29 MED FILL — PANTOPRAZOLE 40 MG TABLET,DELAYED RELEASE: ORAL | 30 days supply | Qty: 30 | Fill #0

## 2023-06-29 MED FILL — JANUVIA 100 MG TABLET: ORAL | 30 days supply | Qty: 30 | Fill #0

## 2023-06-29 MED FILL — MYCOPHENOLATE MOFETIL 250 MG CAPSULE: ORAL | 30 days supply | Qty: 120 | Fill #0

## 2023-06-29 MED FILL — CARVEDILOL 6.25 MG TABLET: ORAL | 30 days supply | Qty: 60 | Fill #2

## 2023-06-29 MED FILL — AMLODIPINE 5 MG TABLET: ORAL | 30 days supply | Qty: 30 | Fill #2

## 2023-06-29 NOTE — Unmapped (Signed)
[facility] Specialty and Home Delivery Pharmacy Refill Coordination Note    Specialty Medication(s) to be Shipped:   Transplant: mycophenolate mofetil 250mg , tacrolimus 1mg , and Prednisone 5mg     Other medication(s) to be shipped: No additional medications requested for fill at this time     Alicia Armstrong, DOB: 1976-03-19  Phone: [phone] (home) [phone] (work)      All above HIPAA information was verified with patient.     Was a Nurse, learning disability used for this call? No    Completed refill call assessment today to schedule patient's medication shipment from the [facility]  [phone]).  All relevant notes have been reviewed.     Specialty medication(s) and dose(s) confirmed: Regimen is correct and unchanged.   Changes to medications: Alicia Armstrong reports no changes at this time.  Changes to insurance: No  New side effects reported not previously addressed with a pharmacist or physician: None reported  Questions for the pharmacist: No    Confirmed patient received a Conservation officer, historic buildings and a Surveyor, mining with first shipment. The patient will receive a drug information handout for each medication shipped and additional FDA Medication Guides as required.       DISEASE/MEDICATION-SPECIFIC INFORMATION        N/A    SPECIALTY MEDICATION ADHERENCE     Medication Adherence    Patient reported X missed doses in the last month: 0  Specialty Medication: mycophenolate 250 mg capsule (CELLCEPT)  Patient is on additional specialty medications: Yes  Additional Specialty Medications: tacrolimus 1 MG capsule (PROGRAF)  Patient Reported Additional Medication X Missed Doses in the Last Month: 0  Patient is on more than two specialty medications: Yes  Specialty Medication: predniSONE 5 MG tablet (DELTASONE)  Patient Reported Additional Medication X Missed Doses in the Last Month: 0              Were doses missed due to medication being on hold? No    mycophenolate 250 mg: 0 days of medicine on hand prednisone 5 mg: 0 days of medicine on hand   tacrolimus 1 mg: 4 days of medicine on hand       REFERRAL TO PHARMACIST     Referral to the pharmacist: Not needed      [facility]     Shipping address confirmed in Epic.       Delivery Scheduled: Yes, Expected medication delivery date: 06/30/23.     Medication will be delivered via UPS to the prescription address in Epic [facility].    Alicia Armstrong   [facility] Specialty and Home Delivery Pharmacy  Specialty Technician

## 2023-06-29 NOTE — Unmapped (Signed)
Patient called to say that she contacted the [facility] mail order pharmacy on 10/31, but was told she could not refill her cellcept or other prescriptions until 11/14, and she only has enough cellcept for today. Contacted the pharmacy and was told that she did not request a refill on cellcept, only her other medications, which were scheduled for shipment in 2 days. Confirmed with them that they could mail out the cellcept today for delivery tomorrow. Notified patient, encouraged her to call them for shipping arrangements, and requested she call this TNC tomorrow when she receives them. Explained that it would likely be in the afternoon, but she could take her morning dose as late in the afternoon. She verbalized understanding.

## 2023-06-30 LAB — GAMMA GT: GAMMA GLUTAMYL TRANSFERASE: 16 IU/L (ref 0–60)

## 2023-06-30 LAB — CBC W/ DIFFERENTIAL
BANDED NEUTROPHILS ABSOLUTE COUNT: 0 10*3/uL (ref 0.0–0.1)
BASOPHILS ABSOLUTE COUNT: 0 10*3/uL (ref 0.0–0.2)
BASOPHILS RELATIVE PERCENT: 0 %
EOSINOPHILS ABSOLUTE COUNT: 0.2 10*3/uL (ref 0.0–0.4)
EOSINOPHILS RELATIVE PERCENT: 5 %
HEMATOCRIT: 41.9 % (ref 34.0–46.6)
HEMOGLOBIN: 13.5 g/dL (ref 11.1–15.9)
IMMATURE GRANULOCYTES: 0 %
LYMPHOCYTES ABSOLUTE COUNT: 0.7 10*3/uL (ref 0.7–3.1)
LYMPHOCYTES RELATIVE PERCENT: 20 %
MEAN CORPUSCULAR HEMOGLOBIN CONC: 32.2 g/dL (ref 31.5–35.7)
MEAN CORPUSCULAR HEMOGLOBIN: 27.8 pg (ref 26.6–33.0)
MEAN CORPUSCULAR VOLUME: 86 fL (ref 79–97)
MONOCYTES ABSOLUTE COUNT: 0.4 10*3/uL (ref 0.1–0.9)
MONOCYTES RELATIVE PERCENT: 11 %
NEUTROPHILS ABSOLUTE COUNT: 2.3 10*3/uL (ref 1.4–7.0)
NEUTROPHILS RELATIVE PERCENT: 64 %
PLATELET COUNT: 125 10*3/uL — ABNORMAL LOW (ref 150–450)
RED BLOOD CELL COUNT: 4.86 x10E6/uL (ref 3.77–5.28)
RED CELL DISTRIBUTION WIDTH: 12.9 % (ref 11.7–15.4)
WHITE BLOOD CELL COUNT: 3.6 10*3/uL (ref 3.4–10.8)

## 2023-06-30 LAB — PHOSPHORUS: PHOSPHORUS, SERUM: 2.8 mg/dL — ABNORMAL LOW (ref 3.0–4.3)

## 2023-06-30 LAB — COMPREHENSIVE METABOLIC PANEL
ALBUMIN: 4.1 g/dL (ref 3.9–4.9)
ALKALINE PHOSPHATASE: 118 IU/L (ref 44–121)
ALT (SGPT): 23 IU/L (ref 0–32)
AST (SGOT): 16 IU/L (ref 0–40)
BILIRUBIN TOTAL (MG/DL) IN SER/PLAS: 0.3 mg/dL (ref 0.0–1.2)
BLOOD UREA NITROGEN: 16 mg/dL (ref 6–24)
BUN / CREAT RATIO: 21 (ref 9–23)
CALCIUM: 9 mg/dL (ref 8.7–10.2)
CHLORIDE: 103 mmol/L (ref 96–106)
CO2: 21 mmol/L (ref 20–29)
CREATININE: 0.75 mg/dL (ref 0.57–1.00)
EGFR: 99 mL/min/{1.73_m2}
GLOBULIN, TOTAL: 1.8 g/dL (ref 1.5–4.5)
GLUCOSE: 154 mg/dL — ABNORMAL HIGH (ref 70–99)
POTASSIUM: 4.3 mmol/L (ref 3.5–5.2)
SODIUM: 141 mmol/L (ref 134–144)
TOTAL PROTEIN: 5.9 g/dL — ABNORMAL LOW (ref 6.0–8.5)

## 2023-06-30 LAB — MAGNESIUM: MAGNESIUM: 1.8 mg/dL (ref 1.6–2.3)

## 2023-06-30 LAB — BILIRUBIN, DIRECT: BILIRUBIN DIRECT: 0.16 mg/dL (ref 0.00–0.40)

## 2023-07-02 LAB — TACROLIMUS LEVEL: TACROLIMUS BLOOD: 9.3 ng/mL (ref 2.0–20.0)

## 2023-07-02 LAB — PHOSPHATIDYLETHANOL (PETH): PHOSPHATIDYLETHANOL (PETH): NEGATIVE ng/mL

## 2023-07-05 DIAGNOSIS — E612 Magnesium deficiency: Principal | ICD-10-CM

## 2023-07-05 DIAGNOSIS — Z5181 Encounter for therapeutic drug level monitoring: Principal | ICD-10-CM

## 2023-07-05 DIAGNOSIS — Z944 Liver transplant status: Principal | ICD-10-CM

## 2023-07-05 DIAGNOSIS — Z796 Long-term use of immunosuppressant medication: Principal | ICD-10-CM

## 2023-07-06 NOTE — Unmapped (Signed)
Patient's Tac level was running too low on 3mg  bid so dose was increased to 4mg /3mg  and is now supratherapeutic with 11/5 results. Reviewed with PharmD Chargualaf, who recommended monitoring with another set of labs, which should be drawn this week.

## 2023-07-07 LAB — CBC W/ DIFFERENTIAL
BANDED NEUTROPHILS ABSOLUTE COUNT: 0 10*3/uL (ref 0.0–0.1)
BASOPHILS ABSOLUTE COUNT: 0 10*3/uL (ref 0.0–0.2)
BASOPHILS RELATIVE PERCENT: 1 %
EOSINOPHILS ABSOLUTE COUNT: 0.2 10*3/uL (ref 0.0–0.4)
EOSINOPHILS RELATIVE PERCENT: 5 %
HEMATOCRIT: 40.4 % (ref 34.0–46.6)
HEMOGLOBIN: 12.9 g/dL (ref 11.1–15.9)
IMMATURE GRANULOCYTES: 0 %
LYMPHOCYTES ABSOLUTE COUNT: 0.7 10*3/uL (ref 0.7–3.1)
LYMPHOCYTES RELATIVE PERCENT: 24 %
MEAN CORPUSCULAR HEMOGLOBIN CONC: 31.9 g/dL (ref 31.5–35.7)
MEAN CORPUSCULAR HEMOGLOBIN: 27.3 pg (ref 26.6–33.0)
MEAN CORPUSCULAR VOLUME: 85 fL (ref 79–97)
MONOCYTES ABSOLUTE COUNT: 0.3 10*3/uL (ref 0.1–0.9)
MONOCYTES RELATIVE PERCENT: 9 %
NEUTROPHILS ABSOLUTE COUNT: 1.7 10*3/uL (ref 1.4–7.0)
NEUTROPHILS RELATIVE PERCENT: 61 %
PLATELET COUNT: 99 10*3/uL — CL (ref 150–450)
RED BLOOD CELL COUNT: 4.73 x10E6/uL (ref 3.77–5.28)
RED CELL DISTRIBUTION WIDTH: 13.3 % (ref 11.7–15.4)
WHITE BLOOD CELL COUNT: 2.8 10*3/uL — ABNORMAL LOW (ref 3.4–10.8)

## 2023-07-07 LAB — COMPREHENSIVE METABOLIC PANEL
ALBUMIN: 4.3 g/dL (ref 3.9–4.9)
ALKALINE PHOSPHATASE: 98 IU/L (ref 44–121)
ALT (SGPT): 18 IU/L (ref 0–32)
AST (SGOT): 14 IU/L (ref 0–40)
BILIRUBIN TOTAL (MG/DL) IN SER/PLAS: 0.4 mg/dL (ref 0.0–1.2)
BLOOD UREA NITROGEN: 14 mg/dL (ref 6–24)
BUN / CREAT RATIO: 19 (ref 9–23)
CALCIUM: 8.9 mg/dL (ref 8.7–10.2)
CHLORIDE: 104 mmol/L (ref 96–106)
CO2: 26 mmol/L (ref 20–29)
CREATININE: 0.74 mg/dL (ref 0.57–1.00)
EGFR: 100 mL/min/{1.73_m2}
GLOBULIN, TOTAL: 1.7 g/dL (ref 1.5–4.5)
GLUCOSE: 158 mg/dL — ABNORMAL HIGH (ref 70–99)
POTASSIUM: 4.3 mmol/L (ref 3.5–5.2)
SODIUM: 142 mmol/L (ref 134–144)
TOTAL PROTEIN: 6 g/dL (ref 6.0–8.5)

## 2023-07-07 LAB — MAGNESIUM: MAGNESIUM: 1.7 mg/dL (ref 1.6–2.3)

## 2023-07-07 LAB — GAMMA GT: GAMMA GLUTAMYL TRANSFERASE: 19 IU/L (ref 0–60)

## 2023-07-07 LAB — PHOSPHORUS: PHOSPHORUS, SERUM: 2.9 mg/dL — ABNORMAL LOW (ref 3.0–4.3)

## 2023-07-07 LAB — BILIRUBIN, DIRECT: BILIRUBIN DIRECT: 0.13 mg/dL (ref 0.00–0.40)

## 2023-07-08 DIAGNOSIS — Z796 Long-term use of immunosuppressant medication: Principal | ICD-10-CM

## 2023-07-08 DIAGNOSIS — Z944 Liver transplant status: Principal | ICD-10-CM

## 2023-07-08 LAB — TACROLIMUS LEVEL: TACROLIMUS BLOOD: 8.8 ng/mL (ref 2.0–20.0)

## 2023-07-08 MED ORDER — TACROLIMUS 1 MG CAPSULE, IMMEDIATE-RELEASE
ORAL_CAPSULE | Freq: Two times a day (BID) | ORAL | 11 refills | 30 days | Status: CP
Start: 2023-07-08 — End: ?

## 2023-07-09 NOTE — Unmapped (Addendum)
[facility] Pharmacist has reviewed a new prescription for tacrolimus that indicates a dose decrease.  Patient was counseled on this dosage change by coordinator [facility] see epic note from 11/14.  Next refill call date adjusted if necessary.        Clinical Assessment Needed For: Dose Change  Medication: Tacrolimus 1mg  capsule  Last Fill Date/Day Supply: 06/29/2023 / 30 days  Refill Too Soon until 07/21/2023  Was previous dose already scheduled to fill: No    Notes to Pharmacist: Will re-test on 11/27

## 2023-07-09 NOTE — Unmapped (Signed)
Patient's Tac level has been supratherapeutic for the last 2 weeks after increasing dose back up to 4mg /3mg  daily. Discussed with PharmD Chargualaf, who recommended reducing her dose back to 3mg  bid.     Contacted patient and relayed recommendation. She verbalized understanding.

## 2023-07-12 DIAGNOSIS — Z796 Long-term use of immunosuppressant medication: Principal | ICD-10-CM

## 2023-07-12 DIAGNOSIS — E612 Magnesium deficiency: Principal | ICD-10-CM

## 2023-07-12 DIAGNOSIS — Z944 Liver transplant status: Principal | ICD-10-CM

## 2023-07-12 DIAGNOSIS — Z5181 Encounter for therapeutic drug level monitoring: Principal | ICD-10-CM

## 2023-07-14 LAB — MAGNESIUM: MAGNESIUM: 1.7 mg/dL (ref 1.6–2.3)

## 2023-07-14 LAB — CBC W/ DIFFERENTIAL
BANDED NEUTROPHILS ABSOLUTE COUNT: 0 10*3/uL (ref 0.0–0.1)
BASOPHILS ABSOLUTE COUNT: 0 10*3/uL (ref 0.0–0.2)
BASOPHILS RELATIVE PERCENT: 1 %
EOSINOPHILS ABSOLUTE COUNT: 0.2 10*3/uL (ref 0.0–0.4)
EOSINOPHILS RELATIVE PERCENT: 5 %
HEMATOCRIT: 41.4 % (ref 34.0–46.6)
HEMOGLOBIN: 13.4 g/dL (ref 11.1–15.9)
IMMATURE GRANULOCYTES: 0 %
LYMPHOCYTES ABSOLUTE COUNT: 0.6 10*3/uL — ABNORMAL LOW (ref 0.7–3.1)
LYMPHOCYTES RELATIVE PERCENT: 22 %
MEAN CORPUSCULAR HEMOGLOBIN CONC: 32.4 g/dL (ref 31.5–35.7)
MEAN CORPUSCULAR HEMOGLOBIN: 27.1 pg (ref 26.6–33.0)
MEAN CORPUSCULAR VOLUME: 84 fL (ref 79–97)
MONOCYTES ABSOLUTE COUNT: 0.3 10*3/uL (ref 0.1–0.9)
MONOCYTES RELATIVE PERCENT: 9 %
NEUTROPHILS ABSOLUTE COUNT: 1.9 10*3/uL (ref 1.4–7.0)
NEUTROPHILS RELATIVE PERCENT: 63 %
PLATELET COUNT: 100 10*3/uL — CL (ref 150–450)
RED BLOOD CELL COUNT: 4.94 x10E6/uL (ref 3.77–5.28)
RED CELL DISTRIBUTION WIDTH: 13 % (ref 11.7–15.4)
WHITE BLOOD CELL COUNT: 3 10*3/uL — ABNORMAL LOW (ref 3.4–10.8)

## 2023-07-14 LAB — COMPREHENSIVE METABOLIC PANEL
ALBUMIN: 4.3 g/dL (ref 3.9–4.9)
ALKALINE PHOSPHATASE: 107 IU/L (ref 44–121)
ALT (SGPT): 22 IU/L (ref 0–32)
AST (SGOT): 17 IU/L (ref 0–40)
BILIRUBIN TOTAL (MG/DL) IN SER/PLAS: 0.4 mg/dL (ref 0.0–1.2)
BLOOD UREA NITROGEN: 17 mg/dL (ref 6–24)
BUN / CREAT RATIO: 23 (ref 9–23)
CALCIUM: 9 mg/dL (ref 8.7–10.2)
CHLORIDE: 102 mmol/L (ref 96–106)
CO2: 24 mmol/L (ref 20–29)
CREATININE: 0.74 mg/dL (ref 0.57–1.00)
EGFR: 100 mL/min/{1.73_m2}
GLOBULIN, TOTAL: 1.9 g/dL (ref 1.5–4.5)
GLUCOSE: 160 mg/dL — ABNORMAL HIGH (ref 70–99)
POTASSIUM: 4.1 mmol/L (ref 3.5–5.2)
SODIUM: 140 mmol/L (ref 134–144)
TOTAL PROTEIN: 6.2 g/dL (ref 6.0–8.5)

## 2023-07-14 LAB — GAMMA GT: GAMMA GLUTAMYL TRANSFERASE: 21 IU/L (ref 0–60)

## 2023-07-14 LAB — PHOSPHORUS: PHOSPHORUS, SERUM: 3.3 mg/dL (ref 3.0–4.3)

## 2023-07-14 LAB — BILIRUBIN, DIRECT: BILIRUBIN DIRECT: 0.13 mg/dL (ref 0.00–0.40)

## 2023-07-15 ENCOUNTER — Institutional Professional Consult (permissible substitution): Admit: 2023-07-15 | Discharge: 2023-07-16 | Payer: BLUE CROSS/BLUE SHIELD

## 2023-07-15 DIAGNOSIS — Z944 Liver transplant status: Principal | ICD-10-CM

## 2023-07-15 DIAGNOSIS — Z796 Long-term use of immunosuppressant medication: Principal | ICD-10-CM

## 2023-07-15 DIAGNOSIS — D702 Other drug-induced agranulocytosis: Principal | ICD-10-CM

## 2023-07-15 LAB — TACROLIMUS LEVEL: TACROLIMUS BLOOD: 9.5 ng/mL (ref 2.0–20.0)

## 2023-07-15 MED ORDER — TACROLIMUS 1 MG CAPSULE, IMMEDIATE-RELEASE
ORAL_CAPSULE | 11 refills | 0 days | Status: CP
Start: 2023-07-15 — End: ?

## 2023-07-15 MED ADMIN — albuterol 2.5 mg /3 mL (0.083 %) nebulizer solution 2.5 mg: 2.5 mg | RESPIRATORY_TRACT | @ 18:00:00 | Stop: 2023-07-15

## 2023-07-15 MED ADMIN — pentamidine (PENTAM) inhalation solution: 300 mg | RESPIRATORY_TRACT | @ 19:00:00 | Stop: 2023-07-15

## 2023-07-15 NOTE — Unmapped (Addendum)
Pt arrived to the [facility] today for her pentamidine tx.  Administered her albuterol and pentamidine txs per [facility] and Therapy plan, pt tolerated txs well.  JJF

## 2023-07-16 NOTE — Unmapped (Signed)
Patient's Tac dose reduced to 3mg  bid on 11/14 and her 11/19 level is still high at 9.5 Discussed with PharmD Chargualaf, who recommended reducing her dose to 3mg /2mg .      Spoke with patient and confirmed that she has been taking the correct dosage and that he lab result was reliable. Instructed her to change dose as per pharmacy recommendation. She verbalized understanding. Patient completed her last pentamidine treatment today.

## 2023-07-17 NOTE — Unmapped (Addendum)
[facility] Pharmacist has reviewed a new prescription for tacrolimus that indicates a dose decrease.  Patient was counseled on this dosage change by [facility]- see epic note from 07/15/23.  Next refill call date adjusted if necessary.        Clinical Assessment Needed For: Dose Change  Medication: Tacrolimus 1mg  capsule  Last Fill Date/Day Supply: 06/29/2023 / 30 days  Refill Too Soon until 07/21/2023  Was previous dose already scheduled to fill: No    Notes to Pharmacist: Will re-test on 11/27

## 2023-07-19 DIAGNOSIS — Z796 Long-term use of immunosuppressant medication: Principal | ICD-10-CM

## 2023-07-19 DIAGNOSIS — Z944 Liver transplant status: Principal | ICD-10-CM

## 2023-07-19 DIAGNOSIS — E612 Magnesium deficiency: Principal | ICD-10-CM

## 2023-07-19 DIAGNOSIS — Z5181 Encounter for therapeutic drug level monitoring: Principal | ICD-10-CM

## 2023-07-21 LAB — COMPREHENSIVE METABOLIC PANEL
ALBUMIN: 4.2 g/dL (ref 3.9–4.9)
ALKALINE PHOSPHATASE: 113 IU/L (ref 44–121)
ALT (SGPT): 23 IU/L (ref 0–32)
AST (SGOT): 16 IU/L (ref 0–40)
BILIRUBIN TOTAL (MG/DL) IN SER/PLAS: 0.5 mg/dL (ref 0.0–1.2)
BLOOD UREA NITROGEN: 15 mg/dL (ref 6–24)
BUN / CREAT RATIO: 18 (ref 9–23)
CALCIUM: 9.1 mg/dL (ref 8.7–10.2)
CHLORIDE: 101 mmol/L (ref 96–106)
CO2: 25 mmol/L (ref 20–29)
CREATININE: 0.83 mg/dL (ref 0.57–1.00)
EGFR: 87 mL/min/{1.73_m2}
GLOBULIN, TOTAL: 1.8 g/dL (ref 1.5–4.5)
GLUCOSE: 185 mg/dL — ABNORMAL HIGH (ref 70–99)
POTASSIUM: 4.3 mmol/L (ref 3.5–5.2)
SODIUM: 140 mmol/L (ref 134–144)
TOTAL PROTEIN: 6 g/dL (ref 6.0–8.5)

## 2023-07-21 LAB — CBC W/ DIFFERENTIAL
BANDED NEUTROPHILS ABSOLUTE COUNT: 0 10*3/uL (ref 0.0–0.1)
BASOPHILS ABSOLUTE COUNT: 0 10*3/uL (ref 0.0–0.2)
BASOPHILS RELATIVE PERCENT: 0 %
EOSINOPHILS ABSOLUTE COUNT: 0.2 10*3/uL (ref 0.0–0.4)
EOSINOPHILS RELATIVE PERCENT: 6 %
HEMATOCRIT: 40.2 % (ref 34.0–46.6)
HEMOGLOBIN: 13 g/dL (ref 11.1–15.9)
IMMATURE GRANULOCYTES: 0 %
LYMPHOCYTES ABSOLUTE COUNT: 0.6 10*3/uL — ABNORMAL LOW (ref 0.7–3.1)
LYMPHOCYTES RELATIVE PERCENT: 22 %
MEAN CORPUSCULAR HEMOGLOBIN CONC: 32.3 g/dL (ref 31.5–35.7)
MEAN CORPUSCULAR HEMOGLOBIN: 26.7 pg (ref 26.6–33.0)
MEAN CORPUSCULAR VOLUME: 83 fL (ref 79–97)
MONOCYTES ABSOLUTE COUNT: 0.3 10*3/uL (ref 0.1–0.9)
MONOCYTES RELATIVE PERCENT: 10 %
NEUTROPHILS ABSOLUTE COUNT: 1.7 10*3/uL (ref 1.4–7.0)
NEUTROPHILS RELATIVE PERCENT: 62 %
PLATELET COUNT: 93 10*3/uL — CL (ref 150–450)
RED BLOOD CELL COUNT: 4.87 x10E6/uL (ref 3.77–5.28)
RED CELL DISTRIBUTION WIDTH: 13.3 % (ref 11.7–15.4)
WHITE BLOOD CELL COUNT: 2.8 10*3/uL — ABNORMAL LOW (ref 3.4–10.8)

## 2023-07-21 LAB — BILIRUBIN, DIRECT: BILIRUBIN DIRECT: 0.17 mg/dL (ref 0.00–0.40)

## 2023-07-21 LAB — PHOSPHORUS: PHOSPHORUS, SERUM: 3.1 mg/dL (ref 3.0–4.3)

## 2023-07-21 LAB — MAGNESIUM: MAGNESIUM: 1.8 mg/dL (ref 1.6–2.3)

## 2023-07-21 LAB — GAMMA GT: GAMMA GLUTAMYL TRANSFERASE: 19 IU/L (ref 0–60)

## 2023-07-21 NOTE — Unmapped (Signed)
 Therapy Update Follow Up: No issues - Copay = $0

## 2023-07-22 LAB — TACROLIMUS LEVEL: TACROLIMUS BLOOD: 6.9 ng/mL (ref 2.0–20.0)

## 2023-07-26 DIAGNOSIS — E612 Magnesium deficiency: Principal | ICD-10-CM

## 2023-07-26 DIAGNOSIS — D702 Other drug-induced agranulocytosis: Principal | ICD-10-CM

## 2023-07-26 DIAGNOSIS — Z796 Long-term use of immunosuppressant medication: Principal | ICD-10-CM

## 2023-07-26 DIAGNOSIS — Z5181 Encounter for therapeutic drug level monitoring: Principal | ICD-10-CM

## 2023-07-26 DIAGNOSIS — Z944 Liver transplant status: Principal | ICD-10-CM

## 2023-07-28 LAB — CBC W/ DIFFERENTIAL
BANDED NEUTROPHILS ABSOLUTE COUNT: 0 10*3/uL (ref 0.0–0.1)
BASOPHILS ABSOLUTE COUNT: 0 10*3/uL (ref 0.0–0.2)
BASOPHILS RELATIVE PERCENT: 1 %
EOSINOPHILS ABSOLUTE COUNT: 0.1 10*3/uL (ref 0.0–0.4)
EOSINOPHILS RELATIVE PERCENT: 5 %
HEMATOCRIT: 40.9 % (ref 34.0–46.6)
HEMOGLOBIN: 13.3 g/dL (ref 11.1–15.9)
IMMATURE GRANULOCYTES: 0 %
LYMPHOCYTES ABSOLUTE COUNT: 0.7 10*3/uL (ref 0.7–3.1)
LYMPHOCYTES RELATIVE PERCENT: 27 %
MEAN CORPUSCULAR HEMOGLOBIN CONC: 32.5 g/dL (ref 31.5–35.7)
MEAN CORPUSCULAR HEMOGLOBIN: 26.8 pg (ref 26.6–33.0)
MEAN CORPUSCULAR VOLUME: 82 fL (ref 79–97)
MONOCYTES ABSOLUTE COUNT: 0.3 10*3/uL (ref 0.1–0.9)
MONOCYTES RELATIVE PERCENT: 10 %
NEUTROPHILS ABSOLUTE COUNT: 1.5 10*3/uL (ref 1.4–7.0)
NEUTROPHILS RELATIVE PERCENT: 57 %
PLATELET COUNT: 86 10*3/uL — CL (ref 150–450)
RED BLOOD CELL COUNT: 4.97 x10E6/uL (ref 3.77–5.28)
RED CELL DISTRIBUTION WIDTH: 13 % (ref 11.7–15.4)
WHITE BLOOD CELL COUNT: 2.6 10*3/uL — ABNORMAL LOW (ref 3.4–10.8)

## 2023-07-28 LAB — GAMMA GT: GAMMA GLUTAMYL TRANSFERASE: 20 IU/L (ref 0–60)

## 2023-07-28 LAB — COMPREHENSIVE METABOLIC PANEL
ALBUMIN: 4.5 g/dL (ref 3.9–4.9)
ALKALINE PHOSPHATASE: 139 IU/L — ABNORMAL HIGH (ref 44–121)
ALT (SGPT): 26 IU/L (ref 0–32)
AST (SGOT): 14 IU/L (ref 0–40)
BILIRUBIN TOTAL (MG/DL) IN SER/PLAS: 0.4 mg/dL (ref 0.0–1.2)
BLOOD UREA NITROGEN: 15 mg/dL (ref 6–24)
BUN / CREAT RATIO: 20 (ref 9–23)
CALCIUM: 8.9 mg/dL (ref 8.7–10.2)
CHLORIDE: 101 mmol/L (ref 96–106)
CO2: 25 mmol/L (ref 20–29)
CREATININE: 0.75 mg/dL (ref 0.57–1.00)
EGFR: 99 mL/min/{1.73_m2}
GLOBULIN, TOTAL: 1.8 g/dL (ref 1.5–4.5)
GLUCOSE: 285 mg/dL — ABNORMAL HIGH (ref 70–99)
POTASSIUM: 4.4 mmol/L (ref 3.5–5.2)
SODIUM: 140 mmol/L (ref 134–144)
TOTAL PROTEIN: 6.3 g/dL (ref 6.0–8.5)

## 2023-07-28 LAB — MAGNESIUM: MAGNESIUM: 1.9 mg/dL (ref 1.6–2.3)

## 2023-07-28 LAB — PHOSPHORUS: PHOSPHORUS, SERUM: 2.9 mg/dL — ABNORMAL LOW (ref 3.0–4.3)

## 2023-07-28 LAB — BILIRUBIN, DIRECT: BILIRUBIN DIRECT: 0.15 mg/dL (ref 0.00–0.40)

## 2023-07-28 NOTE — Unmapped (Signed)
[facility] Refill Coordination Note    Specialty Medication(s) to be Shipped:   Transplant: mycophenolate mofetil 250mg , tacrolimus 1mg , and Prednisone 5mg     Other medication(s) to be shipped:  test strips, lancets, tylenol, amlodipine, aspirin, carvedilol, docusate sodium, Healthylax, Humalog, Lantus, Januvia, magnesium, pantoprazole, pen needles     Alicia Armstrong, DOB: 1976-05-02  Phone: [phone] (home) [phone] (work)      All above HIPAA information was verified with patient.     Was a Nurse, learning disability used for this call? No    Completed refill call assessment today to schedule patient's medication shipment from the [facility]  [phone]).  All relevant notes have been reviewed.     Specialty medication(s) and dose(s) confirmed: Regimen is correct and unchanged.   Changes to medications: Riya reports no changes at this time.  Changes to insurance: No  New side effects reported not previously addressed with a pharmacist or physician: None reported  Questions for the pharmacist: No    Confirmed patient received a Conservation officer, historic buildings and a Surveyor, mining with first shipment. The patient will receive a drug information handout for each medication shipped and additional FDA Medication Guides as required.       DISEASE/MEDICATION-SPECIFIC INFORMATION        N/A    SPECIALTY MEDICATION ADHERENCE     Medication Adherence    Patient reported X missed doses in the last month: 0  Specialty Medication: mycophenolate 250 mg capsule (CELLCEPT)  Patient is on additional specialty medications: Yes  Additional Specialty Medications: predniSONE 5 MG tablet (DELTASONE)  Patient Reported Additional Medication X Missed Doses in the Last Month: 0  Patient is on more than two specialty medications: Yes  Specialty Medication: tacrolimus 1 MG capsule (PROGRAF)  Patient Reported Additional Medication X Missed Doses in the Last Month: 0  Any gaps in refill history greater than 2 weeks in the last 3 months: no  Demonstrates understanding of importance of adherence: yes  Informant: patient  Confirmed plan for next specialty medication refill: delivery by pharmacy  Refills needed for supportive medications: not needed          Refill Coordination    Has the Patients' Contact Information Changed: No  Is the Shipping Address Different: No         Were doses missed due to medication being on hold? No    mycophenolate 250  mg: 1 days of medicine on hand   predniSONE 5  mg: 1 days of medicine on hand   tacrolimus 1  mg: 1 days of medicine on hand       REFERRAL TO PHARMACIST     Referral to the pharmacist: Not needed      [facility]     Shipping address confirmed in Epic.       Delivery Scheduled: Yes, Expected medication delivery date: 07/30/23.     Medication will be delivered via UPS to the prescription address in Epic WAM.    Kerby Less   [facility]  Specialty Technician

## 2023-07-29 DIAGNOSIS — Z944 Liver transplant status: Principal | ICD-10-CM

## 2023-07-29 DIAGNOSIS — Z796 Long-term use of immunosuppressant medication: Principal | ICD-10-CM

## 2023-07-29 LAB — TACROLIMUS LEVEL: TACROLIMUS BLOOD: 9.6 ng/mL (ref 2.0–20.0)

## 2023-07-29 MED ORDER — TACROLIMUS 1 MG CAPSULE, IMMEDIATE-RELEASE
ORAL_CAPSULE | Freq: Two times a day (BID) | ORAL | 11 refills | 30 days | Status: CP
Start: 2023-07-29 — End: ?
  Filled 2023-07-30: qty 150, 30d supply, fill #0

## 2023-07-29 MED FILL — TRUEPLUS PEN NEEDLE 32 GAUGE X 5/32" (4 MM): 25 days supply | Qty: 100 | Fill #0

## 2023-07-29 MED FILL — ACCU-CHEK GUIDE TEST STRIPS: 25 days supply | Qty: 100 | Fill #0

## 2023-07-29 MED FILL — MYCOPHENOLATE MOFETIL 250 MG CAPSULE: ORAL | 30 days supply | Qty: 120 | Fill #1

## 2023-07-29 MED FILL — ACCU-CHEK SOFTCLIX LANCETS: 30 days supply | Qty: 100 | Fill #0

## 2023-07-29 MED FILL — PREDNISONE 5 MG TABLET: ORAL | 30 days supply | Qty: 30 | Fill #1

## 2023-07-29 MED FILL — AMLODIPINE 5 MG TABLET: ORAL | 30 days supply | Qty: 30 | Fill #3

## 2023-07-29 MED FILL — LANTUS SOLOSTAR U-100 INSULIN 100 UNIT/ML (3 ML) SUBCUTANEOUS PEN: SUBCUTANEOUS | 83 days supply | Qty: 15 | Fill #0

## 2023-07-29 MED FILL — PANTOPRAZOLE 40 MG TABLET,DELAYED RELEASE: ORAL | 30 days supply | Qty: 30 | Fill #1

## 2023-07-29 MED FILL — INSULIN LISPRO (U-100) 100 UNIT/ML SUBCUTANEOUS PEN: SUBCUTANEOUS | 30 days supply | Qty: 15 | Fill #0

## 2023-07-29 MED FILL — JANUVIA 100 MG TABLET: ORAL | 30 days supply | Qty: 30 | Fill #1

## 2023-07-29 MED FILL — CARVEDILOL 6.25 MG TABLET: ORAL | 30 days supply | Qty: 60 | Fill #3

## 2023-07-29 NOTE — Unmapped (Addendum)
Patient's Glucose and Tac appear elevated with 12/3 lab results. Spoke with patient, who reports she did not take her Tac before labs that morning and confirmed that she is taking the prescribed dose and the trough timing was reliable. She also stated that she had eaten nothing before labs and when she returned home and checked her blood sugar before breakfast, it was ~140.     Reached out to PharmD Alicia Armstrong, who felt that since her hemoglobin A1c was table on 10/31, then we will plan to just monitor her glucose. However, she did recommend reducing his Tac to 2mg  bid.    Returned to call patient and relayed medication change, instructing her to change her pill box and medication sheet. She verbalized understanding.

## 2023-08-02 DIAGNOSIS — Z944 Liver transplant status: Principal | ICD-10-CM

## 2023-08-02 DIAGNOSIS — Z796 Long-term use of immunosuppressant medication: Principal | ICD-10-CM

## 2023-08-02 DIAGNOSIS — E612 Magnesium deficiency: Principal | ICD-10-CM

## 2023-08-02 DIAGNOSIS — Z5181 Encounter for therapeutic drug level monitoring: Principal | ICD-10-CM

## 2023-08-02 NOTE — Unmapped (Signed)
Pt lvm on Friday afternoon 12/6 re appts on 12/17 and asked for call back.    Today called pt and she wanted to know if pentamidine appt on 12/19 could be moved to 12/17, same day as her 6 mo appts. Told her this tpa would talk to her primary coord about it and get back to her. She verbalized understanding.    Emailed primary coord who responded that pt no longer needs pentamidine appt and said she would call pt.

## 2023-08-04 LAB — CBC W/ DIFFERENTIAL
BANDED NEUTROPHILS ABSOLUTE COUNT: 0 10*3/uL (ref 0.0–0.1)
BASOPHILS ABSOLUTE COUNT: 0 10*3/uL (ref 0.0–0.2)
BASOPHILS RELATIVE PERCENT: 0 %
EOSINOPHILS ABSOLUTE COUNT: 0.1 10*3/uL (ref 0.0–0.4)
EOSINOPHILS RELATIVE PERCENT: 4 %
HEMATOCRIT: 42.7 % (ref 34.0–46.6)
HEMOGLOBIN: 13.6 g/dL (ref 11.1–15.9)
IMMATURE GRANULOCYTES: 0 %
LYMPHOCYTES ABSOLUTE COUNT: 0.7 10*3/uL (ref 0.7–3.1)
LYMPHOCYTES RELATIVE PERCENT: 26 %
MEAN CORPUSCULAR HEMOGLOBIN CONC: 31.9 g/dL (ref 31.5–35.7)
MEAN CORPUSCULAR HEMOGLOBIN: 26.5 pg — ABNORMAL LOW (ref 26.6–33.0)
MEAN CORPUSCULAR VOLUME: 83 fL (ref 79–97)
MONOCYTES ABSOLUTE COUNT: 0.3 10*3/uL (ref 0.1–0.9)
MONOCYTES RELATIVE PERCENT: 10 %
NEUTROPHILS ABSOLUTE COUNT: 1.5 10*3/uL (ref 1.4–7.0)
NEUTROPHILS RELATIVE PERCENT: 60 %
PLATELET COUNT: 94 10*3/uL — CL (ref 150–450)
RED BLOOD CELL COUNT: 5.14 x10E6/uL (ref 3.77–5.28)
RED CELL DISTRIBUTION WIDTH: 13.4 % (ref 11.7–15.4)
WHITE BLOOD CELL COUNT: 2.5 10*3/uL — CL (ref 3.4–10.8)

## 2023-08-04 LAB — COMPREHENSIVE METABOLIC PANEL
ALBUMIN: 4.4 g/dL (ref 3.9–4.9)
ALKALINE PHOSPHATASE: 116 IU/L (ref 44–121)
ALT (SGPT): 18 IU/L (ref 0–32)
AST (SGOT): 12 IU/L (ref 0–40)
BILIRUBIN TOTAL (MG/DL) IN SER/PLAS: 0.6 mg/dL (ref 0.0–1.2)
BLOOD UREA NITROGEN: 14 mg/dL (ref 6–24)
BUN / CREAT RATIO: 17 (ref 9–23)
CALCIUM: 9 mg/dL (ref 8.7–10.2)
CHLORIDE: 100 mmol/L (ref 96–106)
CO2: 25 mmol/L (ref 20–29)
CREATININE: 0.84 mg/dL (ref 0.57–1.00)
EGFR: 86 mL/min/{1.73_m2}
GLOBULIN, TOTAL: 1.6 g/dL (ref 1.5–4.5)
GLUCOSE: 236 mg/dL — ABNORMAL HIGH (ref 70–99)
POTASSIUM: 4 mmol/L (ref 3.5–5.2)
SODIUM: 141 mmol/L (ref 134–144)
TOTAL PROTEIN: 6 g/dL (ref 6.0–8.5)

## 2023-08-04 LAB — MAGNESIUM: MAGNESIUM: 1.9 mg/dL (ref 1.6–2.3)

## 2023-08-04 LAB — PHOSPHORUS: PHOSPHORUS, SERUM: 2.9 mg/dL — ABNORMAL LOW (ref 3.0–4.3)

## 2023-08-04 LAB — GAMMA GT: GAMMA GLUTAMYL TRANSFERASE: 18 IU/L (ref 0–60)

## 2023-08-04 LAB — BILIRUBIN, DIRECT: BILIRUBIN DIRECT: 0.19 mg/dL (ref 0.00–0.40)

## 2023-08-05 ENCOUNTER — Telehealth: Admit: 2023-08-05 | Discharge: 2023-08-06 | Payer: BLUE CROSS/BLUE SHIELD

## 2023-08-05 ENCOUNTER — Ambulatory Visit: Admit: 2023-08-05 | Discharge: 2023-08-06 | Payer: BLUE CROSS/BLUE SHIELD

## 2023-08-05 NOTE — Unmapped (Signed)
POST-TRANSPLANT PSYCHOLOGICAL EVALUATION    Patient Name: Enma Nappier  Medical Record Number: [id number]  Date of Service: August 05, 2023  Clinical Psychologist: Hilbert Corrigan, PsyD  Evaluation Duration and Procedures:  Clinical interview; record review  Procedure Code(s): [phone] (health & behavior assessment) x 1 units      The patient reports they are physically located in [place] and is currently: at home. I conducted a phone visit.  I spent 30 minutes on the phone call with the patient on the date of service . This Clinical research associate used [employer] for the phone call and the interpreter's name and identification number was Camila [id number].     This evaluation note may contain sensitive and confidential information regarding the patient???s psychosocial adjustment to living with a chronic medical condition. DO NOT share this information outside [facility] without written consent from the patient explicitly stating that mental health records may be released.     The limits of confidentiality and the purpose of the evaluation were reviewed. The patient was provided with a verbal description of the nature and purpose of the psychological evaluation. I also reviewed the referral source, specific referral question for this evaluation, foreseeable risks/discomforts, benefits, limits of confidentiality, and mandatory reporting requirements of this provider. The patient was given the opportunity to ask questions and receive answers about the present evaluation. Oral consent was provided by the patient.     BACKGROUND INFORMATION: Ms.  Khatoon was seen for a post-transplant psychological evaluation. She is a 47 y.o. married Hispanic female from [place], [place]. 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       BEHAVIORAL OBSERVATIONS:   Ms. Vertucci arrived for her appointment on time. She was interviewed with the interpreter Camilla from [facility] [employer]  present. Rapport was easily established. She did not seem motivated to present herself in an overly favorable light.      MENTAL STATUS EXAM:  Appearance:  Not assessed due to phone visit  Motor:  Not assessed due to phone visit  Speech/Language: Normal rate, volume, tone, fluency and Language intact, well formed  Mood:  Good  Affect: Mood congruent  Thought Process: Logical, linear, clear, coherent, goal directed  Thought Content: Denies SI, HI, self harm, delusions, obsessions, paranoid ideation, or ideas of reference  Perceptual Disturbances: Denies auditory and visual hallucinations, behavior not concerning for response to internal stimuli  Orientation: Oriented to person, place, time, and general circumstances  Attention: Able to fully attend without fluctuations in consciousness  Concentration: Able to fully concentrate and attend  Memory: Immediate, short-term, long-term, and recall grossly intact  Fund of Knowledge: Consistent with level of education and development  Insight: Intact  Judgment: Intact  Impulse Control: Intact      INTERVIEW:    Adherence:  Medication Adherence: Excellent  Medication Concerns: denied problems taking medications, concerns about side effects, affordability, problems obtaining medications, and difficulty remembering medications  Other Adherence: Excellent       Lifestyle Issues:  Physical activity:  Good. Ms. Mehrens is active at home by maintaining her home, and goes to the gym two to three times weekly working out between 1.5 to 2 hours each time.   Nutrition/Appetite:  Good. Ms. Paluch is getting between two to three meals per day. Ms. Bartman has one protein shake in the morning.   Sleep: Fair. Ms. Buchan is getting between 5-6 hours of sleep per night.   Pain (0=no pain; 10=worst pain imaginable): 0/10  Pain Medications:   NA  Substance Issues:  Nicotine/Tobacco: Denied  Current alcohol use: Denied  Illicit drug use: Denied  -If yes type: N/A  Licit substance abuse or misuse: Denied    Social Issues:  Support/Caregiving Issues: Denied  Social Stressors/Changes: Denied    Psychological/Adjustment Issues:    Regret/Remorse/Guilt Over Transplant: Denied  Feelings about transplant: Ms. Freeberg feels like she has more life in her. Ms. Jimison has been taking care of herself ans she feels happier following transplant.     Mood Over Past 30 Days (1:NONE to 10:HIGHEST):   --Depression: 1  --Anxiety: 1  --Irritability: 1  --Happiness: 10    Depressive Disorder Symptoms: no    Cognitive Symptoms:  Ms. Lerew occasionally experiences forgetfulness.     Psychotic Symptoms: No    Anxiety:  Worry: Ms. Vilorio experiences worries about every day life events.   GAD: Denied  Panic: no  Agoraphobia: Denied  Phobias: Denied    PTSD: Denied    Other DSM-V Diagnoses: no    Coping Style: Functional          INTERVENTION: Health and Behavior      PSYCHIATRIC DIAGNOSES:   None                               IMPRESSIONS, RECOMMENDATIONS, AND PLAN: Overall, Ms. Takahashi presents as doing well.She is adherent to taking her medications and denied experiencing challenges with her medications. She is active evidenced by maintaining her home and working out at the gym two to three times weekly working out for 1.5 to 2 hours each time. She eats between 2-3 meals regularly and described herself as having a good appetite. She gets between 5-6 hours of sleep nightly. She denied nicotine/tobacco [place], alcohol use, illicit/licit substance use. She denied any caregiver issues and indicated that she is not experiencing stressors that are impeding her ability to function. She has no remorse or regret regarding receiving a liver. Ms. Lenius feels like she has more life in her following the transplant. Ms. Craddick indicated that she experiences daily stressors, however nothing that impedes her ability to function. Ms. Tracy denied experiencing MH symptoms at this time. Ms. Mathey also appears to have a functional coping style.     Recommendations  Encouraged her to monitor her mood for any changes, and she may be re-referred to transplant psychology if she reports any worsening in her mood.    Should the patient or treatment team notice a change in functioning, please refer back to transplant psychology for further evaluation and treatment.        Recommendations discussed with patient? yes  Agreed upon by patient? yes

## 2023-08-05 NOTE — Unmapped (Addendum)
[facility] Pharmacist has reviewed a new prescription for tacrolimus that indicates a dose decrease.  Patient was counseled on this dosage change by [facility]- see epic note from 07/29/23.  Next refill call date adjusted if necessary.      Clinical Assessment Needed For: Dose Change  Medication: Tacrolimus 1mg  capsule  Last Fill Date/Day Supply: 07/30/2023 / 30 days  Refill Too Soon until 08/20/2023  Was previous dose already scheduled to fill: No    Notes to Pharmacist: Will re-test on 12/27

## 2023-08-05 NOTE — Unmapped (Signed)
Patient left VM requesting return call from [facility] with interpreter. Contacted patient with assistance from [place] [phone]) with [employer]. Patient verbalized concern that she was shipped all her medications, but they did not send her 81mg  aspirin and mentioned something about a down payment. Reassured patient that she could obtain this medication OTC and provided name/description of it. With interpreter assistance, reached out to [facility] pharmacy to discuss changes in the pharmacy acct mgmt.    Spoke with Dorene Grebe in pharmacy who said that the Mg and ASA were not shipped b/c they did not have a credit card on file for her to cover her copays, but then she found one. She would attempt to get the medication out to her for Saturday delivery, and there should be no issue with the patient obtaining her medications in the future. She verbalized understanding.

## 2023-08-06 DIAGNOSIS — Z1389 Encounter for screening for other disorder: Principal | ICD-10-CM

## 2023-08-06 DIAGNOSIS — Z5181 Encounter for therapeutic drug level monitoring: Principal | ICD-10-CM

## 2023-08-06 DIAGNOSIS — Z944 Liver transplant status: Principal | ICD-10-CM

## 2023-08-06 DIAGNOSIS — E612 Magnesium deficiency: Principal | ICD-10-CM

## 2023-08-06 LAB — TACROLIMUS LEVEL: TACROLIMUS BLOOD: 4.6 ng/mL (ref 2.0–20.0)

## 2023-08-06 MED FILL — MG-PLUS-PROTEIN 133 MG TABLET: ORAL | 50 days supply | Qty: 100 | Fill #1

## 2023-08-06 MED FILL — ASPIRIN 81 MG TABLET,DELAYED RELEASE: ORAL | 30 days supply | Qty: 30 | Fill #0

## 2023-08-06 NOTE — Unmapped (Addendum)
Contacted patient to request she repeat labs on Tuesday at the main hospital before her 6 mo f/up visit. Placed lab orders per guidelines. Patient verbalized understanding.     Messaged PharmD Chargualaf re.low 12/10 Tac at 4.6. She recommended no change in her dose of Tac for now, will continue to monitor.

## 2023-08-09 DIAGNOSIS — Z1389 Encounter for screening for other disorder: Principal | ICD-10-CM

## 2023-08-09 DIAGNOSIS — E612 Magnesium deficiency: Principal | ICD-10-CM

## 2023-08-09 DIAGNOSIS — Z944 Liver transplant status: Principal | ICD-10-CM

## 2023-08-09 DIAGNOSIS — Z796 Long-term use of immunosuppressant medication: Principal | ICD-10-CM

## 2023-08-09 DIAGNOSIS — Z5181 Encounter for therapeutic drug level monitoring: Principal | ICD-10-CM

## 2023-08-09 NOTE — Unmapped (Signed)
[facility] TRANSPLANT CLINIC PHARMACY NOTE  Alicia Armstrong  [id number]    Medication changes today:   Decrease MMF to 250 mg BID  Increase Lantus to 18 units at bedtime  Resume Novolog 4 units TID AC plus SSI  Start Senna 8.6 mg daily PRN  Start Biotin 5 mg daily    Education/Adherence tools provided today:  - Provided updated medication list  - Provided additional education on immunosuppression and transplant related medications including reviewing indications of medications, dosing and side effects  - Discussed adherence reminder tools such as cell phone alarms    Follow up items:  1. Goal of understanding indications and dosing of immunosuppression medications  2. BG, A1c ~09/24/23  3. BP    Next visit with pharmacy in 1-3 months  ____________________________________________________________________    Alicia Armstrong is a 47 y.o. 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     Immunologic Risk: first transplant    Other PMH significant for n/a    Induction: Methylpred    Post op course uncomplicated    Readmitted 7/6-03/02/23 with back pain and elevated bilirubin  Liver ultrasound showed concern for possible thrombosis or stenosis at common hepatic artery for which LMWH ppx 30 mg BID with plans for continuation for ~6 weeks.    7/15-7/17/24 admitted to [facility] from clinic with cytopenia, received dose of gCSF, 2 units PRBC, transitioned from valganciclovir to letermovir, enoxaparin increased to 40 mg BID    04/12/23: Increase MMF to 500 mg BID, decrease prednisone to 15 mg daily x7d then 10 mg daily indefinitely    Biliary stent removed 05/05/23    Rejection History:  Admitted 8/6-8/14/24 for ACR treated with high dose steroids. 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    Infection History: NTD  ___________________________________________________________________    Last seen by pharmacy 3 months ago    Interval History:   Tac reduced to 2 mg BID 12/5  Pentam 11/21  Elevated BG on labs    Seen by pharmacy today for: medication management, blood glucose management and education, and pill box fill and adherence education    CC:  Patient complains of     General: No issues  Neuro: Vision changes  CV: No issues  Resp: No issues  GI: Constipation  GU: No issues  Derm: Hair loss  Psych: No issues.    Vitals:    08/10/23 0832   BP: 104/54   Pulse: 80   Temp: 36.7 ??C (98.1 ??F)   SpO2: 100%       _________________________________________________________________    Allergies   Allergen Reactions    Penicillins Other (See Comments)     Had a rash with penicillin when she was 47 years old. She tolerated piperacillin/tazobactam 08/25/2016-08/28/2016 with itching without rash. Tolerated Augmentin.       Medications reviewed in EPIC medication station and updated today by the clinical pharmacist practitioner.    Current Outpatient Medications   Medication Instructions    acetaminophen (TYLENOL) 325-650 mg, Oral, Every 8 hours PRN    amlodipine (NORVASC) 5 mg, Oral, Daily (standard)    aspirin (ECOTRIN) 81 mg, Oral, Daily (standard)    biotin 5,000 mcg, Oral, Daily (standard)    blood sugar diagnostic (ACCU-CHEK GUIDE TEST STRIPS) Strp Other, 4 times a day    blood-glucose meter kit Use as instructed    carvedilol (COREG) 6.25 mg, Oral, 2 times a day (standard)    docusate  sodium (COLACE) 100 mg, Oral, 2 times a day PRN    gabapentin (NEURONTIN) 400 mg, Oral, 2 times a day    insulin lispro (HUMALOG) 4 Units, Subcutaneous, 3 times a day (AC), Take in addition to sliding scale. Max 50 units per day    JANUVIA 100 mg, Oral, Daily (standard)    lancets Misc Use to check blood sugar as directed with insulin 3 times a day & for symptoms of high or low blood sugar.    LANTUS SOLOSTAR U-100 INSULIN 18 Units, Subcutaneous, Nightly    magnesium oxide-Mg AA chelate (MAGNESIUM, AMINO ACID CHELATE,) 133 mg 1 tablet, Oral, 2 times a day (standard)    methocarbamol (ROBAXIN) 500 mg, Oral, 4 times a day PRN    mycophenolate (CELLCEPT) 500 mg, Oral, 2 times a day (standard)    pantoprazole (PROTONIX) 40 mg, Oral, Daily (standard)    pen needle, diabetic 32 gauge x 5/32 (4 mm) Ndle Use with insulin up to 4 times/day as needed.    polyethylene glycol (MIRALAX) 17 gram packet Mix 1 packet in  4 to 8 ounces of liquid and drink daily as needed.    predniSONE (DELTASONE) 5 mg, Oral, Daily (standard)    SENNA 8.6 mg tablet 1 tablet, Oral, Daily PRN    tacrolimus (PROGRAF) 2 mg, Oral, 2 times a day     GRAFT FUNCTION: stable    Lab Results   Component Value Date    AST 13 08/10/2023    AST 12 08/03/2023    ALT 18 08/10/2023    ALT 18 08/03/2023    Total Bilirubin 0.7 08/10/2023    Total Bilirubin 0.6 08/03/2023      Zero hour biopsy: negative for malignancy    Biopsies to date:  8/9: Minimal to mild portal inflammation, mild bile duct injury and focal endotheliitis, compatible with minimal acute cellular rejection, Banff score=3 (1+1+1)      Renal Function: stable    Lab Results   Component Value Date    Creatinine 0.71 08/10/2023    Creatinine 0.84 08/03/2023    Creatinine 0.75 07/27/2023    Creatinine 0.83 07/20/2023    Creatinine 0.74 07/13/2023     Proteinuria/UPC:  none.  No results found for: PCRATIOUR      CURRENT IMMUNOSUPPRESSION:    Tacrolimus (Prograf) 2 mg every morning + 2 mg every evening    Tacrolimus Goal: 6 - 8  Mycophenolate mofetil (Cellcept) 500 mg BID   Prednisone 5 mg daily    IMMUNOSUPPRESSION DRUG LEVELS:  Lab Results   Component Value Date    Tacrolimus, Trough 16.7 (H) 03/10/2023    Tacrolimus, Trough 10.4 03/09/2023    Tacrolimus, Trough 10.7 02/23/2023    Tacrolimus Lvl 4.6 08/03/2023    Tacrolimus Lvl 9.6 07/27/2023    Tacrolimus Lvl 6.9 07/20/2023     Prograf level is accurate 12 hour trough    Patient is tolerating immunosuppression well    WBC/ANC:   low  Lab Results   Component Value Date    WBC 3.3 (L) 08/10/2023    WBC 2.5 (LL) 08/03/2023       Plan: Will decrease MMF to 250 mg BID.      OI Prophylaxis:   CMV Status: D-/ R+, moderate risk.   CMV prophylaxis:  letermovir 480 mg  x 3 months per protocol (completed 05/13/23)  Valcyte stopped for neturopenia  No results found for: CMVCP    HSV ppx: acyclovir 400 mg BID (  end 05/18/23)     PCP Prophylaxis: pentamidine 300 mg inhalation monthly x 6 months (completed)     Thrush: completed in hospital    Patient is  tolerating infectious prophylaxis well    Plan: Continue per protocol.  Stop acyclovir (therapy completed).    HAT ppx:  Meds currently on: aspirin 81 mg daily    CAD prevention:  The 10-year ASCVD risk score (Arnett DK, et al., 2019) is: 1.5%    Values used to calculate the score:      Age: 47 years      Sex: Female      Is Non-Hispanic African American: No      Diabetic: Yes      Tobacco smoker: No      Systolic Blood Pressure: 104 mmHg      Is BP treated: Yes      HDL Cholesterol: 46 mg/dL      Total Cholesterol: 158 mg/dL    Note: For patients with SBP <90 or >200, Total Cholesterol <130 or >320, HDL <20 or >100 which are outside of the allowable range, the calculator will use these upper or lower values to calculate the patient???s risk score.     Statin therapy: Indicated; currently on no statin  Plan: Repeat lipid panel. Consider statin in the future. Continue to monitor.       BP: Goal < 140/90. Clinic vitals reported above  Home BP ranges: 110-140s/60-70s    Current meds include: amlodipine 5 mg daily, carvedilol 6.25 mg BID  Plan: within goal. Continue to monitor    Anemia of CKD:  H/H:   Lab Results   Component Value Date    HGB 14.0 08/10/2023     Lab Results   Component Value Date    HCT 41.3 08/10/2023     Iron panel:  Lab Results   Component Value Date    IRON 51 03/22/2023    TIBC 229 (L) 03/22/2023    FERRITIN 396.4 (H) 03/08/2023     Lab Results   Component Value Date    Iron Saturation (%) 22 03/22/2023     Prior ESA use: none post txp    Plan: stable. Continue to monitor.     DM:   Lab Results   Component Value Date A1C 6.1 (H) 06/24/2023   . Goal A1c < 7  History of Dm?  No - post txp hyperglycemia    Established with endocrinologist/PCP for BG managment? No  Currently on:   sitagliptin 100 mg daily  insulin glargine 16 units nightly  insulin lispro 4 units TID AC + correction (not using, meter broken)    Not checking recently  Elevated FBG with labs. 288 today    Diet: Pt reports increased carbs    Exercise: not assessed  Hypoglycemia: No    Plan: Increase Lantus to 18 units at bedtime. Resume Novolog use. Meter Rx sent to [facility]. Recommend check A1c at next visit. Continue to monitor.    Electrolytes: wnl  Lab Results   Component Value Date    Potassium 4.0 08/10/2023    Potassium 4.0 08/03/2023    Potassium, Bld 3.8 09/07/2022    Sodium 137 08/10/2023    Sodium 141 08/03/2023    Sodium Whole Blood 131 (L) 09/07/2022    Magnesium 2.0 08/10/2023    Magnesium 1.9 08/03/2023    CO2 26.0 08/10/2023    CO2 25 08/03/2023     Meds currently on: magnesium plus protein 133  mg BID  Plan: No change.  Continue to monitor     GI/BM: pt reports constipation   Meds currently on: docusate BID, Miralax daily, pantoprazole 40 mg daily  Plan: Increase Miralax to BID. and Start Senna 8.6 mg daily PRN. Continue to monitor    Pain: pt reports no pain  Meds currently on: APAP PRN  Plan: No change.  Continue to monitor.    Bone health:   Vitamin D Level: none available. Goal > 30.   No results found for: VITDTOT, VITDTOTAL    Lab Results   Component Value Date    Calcium 9.5 08/10/2023    Calcium 9.0 08/03/2023    Calcium 8.9 07/27/2023       Last DEXA results:   normal in 2018  Current meds include: none  Plan: Vitamin D level  needs to be drawn with next lab schedule. Continue to monitor.     Women's/Men's Health:  Alicia Armstrong is a 47 y.o. Female perimenopausal. Patient reports no men's/women's health issues  Plan: Continue to monitor    Immunizations:  Influenza [Annual]: Received 04/2023    PPSV23: Received 08/2016  PCV20: Received 08/2022    Shingrix Zoster [2 doses, 2 - 6 months apart]: 1st dose given 08/2022 and 2nd dose due 10/2022    COVID-19 [3 primary doses, 2 boosters]: Received 04/2023      Pharmacy preference:  [facility]  Medication Refills:  [facility] - [facility]  Medication Access:  N/a    Adherence: Patient has good understanding of medications; was able to independently identify names/doses of immunosuppressants and OI meds.  Patient  does fill their own pill box on a regular basis at home.  Patient brought medication card:no  Pill box:was correct  Plan: provided basic adherence counseling/intervention      During this visit, the following was completed:   BG log data assessment  BP log data assessment  Labs ordered and evaluated  complex treatment plan >1 DS   I spent a total of 35 minutes face to face with the patient delivering clinical care and providing education/counseling.    All questions/concerns were addressed to the patient's satisfaction.  __________________________________________    Olivia Mackie, PharmD, CPP  Solid Organ Transplant Clinical Pharmacist Practitioner

## 2023-08-10 ENCOUNTER — Ambulatory Visit: Admit: 2023-08-10 | Discharge: 2023-08-10 | Payer: BLUE CROSS/BLUE SHIELD

## 2023-08-10 ENCOUNTER — Institutional Professional Consult (permissible substitution): Admit: 2023-08-10 | Discharge: 2023-08-10 | Payer: BLUE CROSS/BLUE SHIELD

## 2023-08-10 ENCOUNTER — Ambulatory Visit
Admit: 2023-08-10 | Discharge: 2023-08-10 | Payer: BLUE CROSS/BLUE SHIELD | Attending: Internal Medicine | Primary: Internal Medicine

## 2023-08-10 DIAGNOSIS — Z796 Long-term use of immunosuppressant medication: Principal | ICD-10-CM

## 2023-08-10 DIAGNOSIS — K5900 Constipation, unspecified: Principal | ICD-10-CM

## 2023-08-10 DIAGNOSIS — Z944 Liver transplant status: Principal | ICD-10-CM

## 2023-08-10 LAB — CBC W/ AUTO DIFF
BASOPHILS ABSOLUTE COUNT: 0 10*9/L (ref 0.0–0.1)
BASOPHILS RELATIVE PERCENT: 0.3 %
EOSINOPHILS ABSOLUTE COUNT: 0.1 10*9/L (ref 0.0–0.5)
EOSINOPHILS RELATIVE PERCENT: 3 %
HEMATOCRIT: 41.3 % (ref 34.0–44.0)
HEMOGLOBIN: 14 g/dL (ref 11.3–14.9)
LYMPHOCYTES ABSOLUTE COUNT: 0.9 10*9/L — ABNORMAL LOW (ref 1.1–3.6)
LYMPHOCYTES RELATIVE PERCENT: 28.4 %
MEAN CORPUSCULAR HEMOGLOBIN CONC: 33.9 g/dL (ref 32.0–36.0)
MEAN CORPUSCULAR HEMOGLOBIN: 26.5 pg (ref 25.9–32.4)
MEAN CORPUSCULAR VOLUME: 78.1 fL (ref 77.6–95.7)
MEAN PLATELET VOLUME: 8.2 fL (ref 6.8–10.7)
MONOCYTES ABSOLUTE COUNT: 0.4 10*9/L (ref 0.3–0.8)
MONOCYTES RELATIVE PERCENT: 10.7 %
NEUTROPHILS ABSOLUTE COUNT: 1.9 10*9/L (ref 1.8–7.8)
NEUTROPHILS RELATIVE PERCENT: 57.6 %
PLATELET COUNT: 95 10*9/L — ABNORMAL LOW (ref 150–450)
RED BLOOD CELL COUNT: 5.3 10*12/L — ABNORMAL HIGH (ref 3.95–5.13)
RED CELL DISTRIBUTION WIDTH: 14.7 % (ref 12.2–15.2)
WBC ADJUSTED: 3.3 10*9/L — ABNORMAL LOW (ref 3.6–11.2)

## 2023-08-10 LAB — COMPREHENSIVE METABOLIC PANEL
ALBUMIN: 4.4 g/dL (ref 3.4–5.0)
ALKALINE PHOSPHATASE: 135 U/L — ABNORMAL HIGH (ref 46–116)
ALT (SGPT): 18 U/L (ref 10–49)
ANION GAP: 13 mmol/L (ref 5–14)
AST (SGOT): 13 U/L (ref <=34)
BILIRUBIN TOTAL: 0.7 mg/dL (ref 0.3–1.2)
BLOOD UREA NITROGEN: 19 mg/dL (ref 9–23)
BUN / CREAT RATIO: 27
CALCIUM: 9.5 mg/dL (ref 8.7–10.4)
CHLORIDE: 98 mmol/L (ref 98–107)
CO2: 26 mmol/L (ref 20.0–31.0)
CREATININE: 0.71 mg/dL (ref 0.55–1.02)
EGFR CKD-EPI (2021) FEMALE: >90 mL/min/{1.73_m2} (ref >=60)
GLUCOSE RANDOM: 288 mg/dL — ABNORMAL HIGH (ref 70–99)
POTASSIUM: 4 mmol/L (ref 3.4–4.8)
PROTEIN TOTAL: 6.8 g/dL (ref 5.7–8.2)
SODIUM: 137 mmol/L (ref 135–145)

## 2023-08-10 LAB — TACROLIMUS LEVEL: TACROLIMUS BLOOD: 4.5 ng/mL

## 2023-08-10 LAB — MAGNESIUM: MAGNESIUM: 2 mg/dL (ref 1.6–2.6)

## 2023-08-10 LAB — PHOSPHORUS: PHOSPHORUS: 3.1 mg/dL (ref 2.4–5.1)

## 2023-08-10 LAB — BILIRUBIN, DIRECT: BILIRUBIN DIRECT: 0.2 mg/dL (ref 0.00–0.30)

## 2023-08-10 LAB — GAMMA GT: GAMMA GLUTAMYL TRANSFERASE: 21 U/L (ref 0–38)

## 2023-08-10 MED ORDER — MYCOPHENOLATE MOFETIL 250 MG CAPSULE
ORAL_CAPSULE | Freq: Two times a day (BID) | ORAL | 3 refills | 90.00 days | Status: CP
Start: 2023-08-10 — End: 2024-08-09
  Filled 2023-08-30: qty 180, 90d supply, fill #0

## 2023-08-10 MED ORDER — BIOTIN 5 MG CAPSULE
ORAL_CAPSULE | Freq: Every day | ORAL | 11 refills | 30 days | Status: CP
Start: 2023-08-10 — End: ?

## 2023-08-10 MED ORDER — TACROLIMUS 1 MG CAPSULE, IMMEDIATE-RELEASE
ORAL_CAPSULE | ORAL | 11 refills | 30.00 days | Status: CP
Start: 2023-08-10 — End: ?

## 2023-08-10 MED ORDER — BLOOD-GLUCOSE METER KIT WRAPPER
0 refills | 0.00 days | Status: CP
Start: 2023-08-10 — End: 2024-08-09
  Filled 2023-08-30: qty 100, 25d supply, fill #1

## 2023-08-10 MED ORDER — SENNOSIDES 8.6 MG TABLET
ORAL_TABLET | Freq: Every day | ORAL | 2 refills | 30.00 days | Status: CP | PRN
Start: 2023-08-10 — End: 2024-08-09

## 2023-08-10 MED ORDER — POLYETHYLENE GLYCOL 3350 17 GRAM ORAL POWDER PACKET
PACK | Freq: Every day | ORAL | 11 refills | 30.00 days | Status: CP | PRN
Start: 2023-08-10 — End: ?

## 2023-08-10 NOTE — Unmapped (Signed)
Patient seen in clinic today for her 6mos post-liver txp and return to hepatology appt with Spanish interpreter present. She was also seen today by pharmacy. Patient reports her greatest concern is her constipation. Encouraged her with increased fluids, whole grain foods/fruits/vegetables and let her know she could use miralax daily. She reported to pharmacist, that her glucometer had broken, so she had not been administering her insulin. Reinforced the importance of contacting the [facility] when this occurs, since there should be no delay or omission in her med schedule. She denies any n/v/d/chronic pain/fever/chills/dysuria/hematuria or blood in her stools. She did mention experiencing discomfort to her LUQ when she bumps her side, but denies any pain to liver txp site. Notified Dr. Ruffin Frederick. She is taking her IS meds as ordered and today's result will be reliable. Patient to have ercp in Feb to remove her metal biliary stent. Will plan to continue weekly labs for now with and rtc in 3 mos. Spent about 10 min on post-txp health maintenance education. Patient verbalized understanding of all discussed.

## 2023-08-10 NOTE — Unmapped (Signed)
 Please see patient pharmacy visit for documentation.

## 2023-08-10 NOTE — Unmapped (Signed)
Interpreter has been requested.

## 2023-08-10 NOTE — Unmapped (Deleted)
[facility]  08/10/2023    Reason for visit: {New/Return/Post:94740}    Assessment/Plan:    47 y.o. female with ***    No follow-ups on file.    Subjective   History of Present Illness   {Accompanied by:94748}    47 y.o. 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     Her post transplant course has been ACR in 03/2023 which was treated with increased steroids. She also had a biliary stricture which was managed with ERCP with placemetn of biliary stent on 04/06/2023. Se has since had an EGD with removal of her pancreatic stent and is planned for a repeat ERCP in 10/2023 for biliary stent removal.     Objective   Physical Exam   Vital Signs: BP 104/54 (BP Site: L Arm, BP Position: Sitting, BP Cuff Size: Large)  - Pulse 80  - Temp 36.7 ??C (98.1 ??F) (Tympanic)  - Ht 140 cm (4' 7.12)  - Wt 71.4 kg (157 lb 4.8 oz)  - SpO2 100%  - BMI 36.40 kg/m??   Constitutional: She is in no apparent distress  Eyes: Anicteric sclerae  Cardiovascular: No peripheral edema  Gastrointestinal: Soft, nontender abdomen without hepatosplenomegaly, hernias, or masses  Neurologic: Awake, alert, and oriented to person, place, and time with normal speech and no asterixis    {Labs/Imaging (Optional):104318}    {Optional documentation elements (Optional):94651}

## 2023-08-10 NOTE — Unmapped (Signed)
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         -IS regimen reviewed with pharmacy.  Due to AIH and history of ACR will need to keep on triple therapy for now but will dose reduce myfortic.  -counseled on age appropriate cancer screening and annual dermatology visits.  -will need to improve glucose control.  May nee GLP1.  -LFTs reviewed and normal.  -RTC 3 months      Subjective   History of Present Illness   Accompanied by: N/A (unaccompanied)    47 y.o. 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  Did require biliary stent and had episode of ACR, but since then has been doing very well.  Normal LFTs, adherent with all meds.  No jaundice, encephalopathy, ascites or GI bleeding.      Objective   Physical Exam   Vital Signs: BP 104/54 (BP Site: L Arm, BP Position: Sitting, BP Cuff Size: Large)  - Pulse 80  - Temp 36.7 ??C (98.1 ??F) (Tympanic)  - Ht 140 cm (4' 7.12)  - Wt 71.4 kg (157 lb 4.8 oz)  - SpO2 100%  - BMI 36.40 kg/m??   Constitutional: She is in no apparent distress  Eyes: Anicteric sclerae  Cardiovascular: No peripheral edema  Gastrointestinal: Soft, nontender abdomen without hepatosplenomegaly, hernias, or masses  Neurologic: Awake, alert, and oriented to person, place, and time with normal speech and no asterixis      Lab Results   Component Value Date    WBC 3.3 (L) 08/10/2023    HGB 14.0 08/10/2023    HCT 41.3 08/10/2023    PLT 95 (L) 08/10/2023       Lab Results   Component Value Date    NA 137 08/10/2023    K 4.0 08/10/2023    CL 98 08/10/2023    CO2 26.0 08/10/2023    BUN 19 08/10/2023    CREATININE 0.71 08/10/2023    GLU 288 (H) 08/10/2023    CALCIUM 9.5 08/10/2023    MG 2.0 08/10/2023    PHOS 3.1 08/10/2023       Lab Results   Component Value Date    BILITOT 0.7 08/10/2023    BILIDIR 0.20 08/10/2023    PROT 6.8 08/10/2023    ALBUMIN 4.4 08/10/2023    ALT 18 08/10/2023    AST 13 08/10/2023    ALKPHOS 135 (H) 08/10/2023    GGT 21 08/10/2023       Lab Results   Component Value Date    PT 12.0 03/30/2023    INR 1.07 03/30/2023    APTT 31.8 03/30/2023

## 2023-08-10 NOTE — Unmapped (Signed)
Int id# [id number]

## 2023-08-11 NOTE — Unmapped (Addendum)
[facility] Pharmacist has reviewed a new prescription for mycophenolate that indicates a dose decrease.  Patient was counseled on this dosage change by [facility]- see epic note from 08/10/23.  Next refill call date adjusted if necessary.          Clinical Assessment Needed For: Dose Change  Medication: Mycophenolate 250mg  capsule  Last Fill Date/Day Supply: 07/29/2023 / 30 days  Refill Too Soon until 08/20/2023  Was previous dose already scheduled to fill: No    Notes to Pharmacist: Will re-test on 12/27. Healthylax - $50.58 (not covered by ins)

## 2023-08-11 NOTE — Unmapped (Signed)
 I was the supervising physician in the delivery of the service. Vickii Chafe, MD

## 2023-08-11 NOTE — Unmapped (Signed)
Patient's Tac level again below goal. Received notice from PharmD [name] to increase dose to 3mg /2mg  daily. Contacted patient and relayed dose change. She verbalized understanding.

## 2023-08-16 DIAGNOSIS — Z5181 Encounter for therapeutic drug level monitoring: Principal | ICD-10-CM

## 2023-08-16 DIAGNOSIS — E612 Magnesium deficiency: Principal | ICD-10-CM

## 2023-08-16 DIAGNOSIS — Z944 Liver transplant status: Principal | ICD-10-CM

## 2023-08-16 DIAGNOSIS — Z796 Long-term use of immunosuppressant medication: Principal | ICD-10-CM

## 2023-08-18 LAB — CBC W/ DIFFERENTIAL
BANDED NEUTROPHILS ABSOLUTE COUNT: 0 10*3/uL (ref 0.0–0.1)
BASOPHILS ABSOLUTE COUNT: 0 10*3/uL (ref 0.0–0.2)
BASOPHILS RELATIVE PERCENT: 0 %
EOSINOPHILS ABSOLUTE COUNT: 0.1 10*3/uL (ref 0.0–0.4)
EOSINOPHILS RELATIVE PERCENT: 3 %
HEMATOCRIT: 40.6 % (ref 34.0–46.6)
HEMOGLOBIN: 12.7 g/dL (ref 11.1–15.9)
IMMATURE GRANULOCYTES: 0 %
LYMPHOCYTES ABSOLUTE COUNT: 0.7 10*3/uL (ref 0.7–3.1)
LYMPHOCYTES RELATIVE PERCENT: 30 %
MEAN CORPUSCULAR HEMOGLOBIN CONC: 31.3 g/dL — ABNORMAL LOW (ref 31.5–35.7)
MEAN CORPUSCULAR HEMOGLOBIN: 25.9 pg — ABNORMAL LOW (ref 26.6–33.0)
MEAN CORPUSCULAR VOLUME: 83 fL (ref 79–97)
MONOCYTES ABSOLUTE COUNT: 0.2 10*3/uL (ref 0.1–0.9)
MONOCYTES RELATIVE PERCENT: 10 %
NEUTROPHILS ABSOLUTE COUNT: 1.4 10*3/uL (ref 1.4–7.0)
NEUTROPHILS RELATIVE PERCENT: 57 %
PLATELET COUNT: 104 10*3/uL — ABNORMAL LOW (ref 150–450)
RED BLOOD CELL COUNT: 4.9 x10E6/uL (ref 3.77–5.28)
RED CELL DISTRIBUTION WIDTH: 14.5 % (ref 11.7–15.4)
WHITE BLOOD CELL COUNT: 2.5 10*3/uL — CL (ref 3.4–10.8)

## 2023-08-18 LAB — COMPREHENSIVE METABOLIC PANEL
ALBUMIN: 4.3 g/dL (ref 3.9–4.9)
ALKALINE PHOSPHATASE: 119 IU/L (ref 44–121)
ALT (SGPT): 18 IU/L (ref 0–32)
AST (SGOT): 12 IU/L (ref 0–40)
BILIRUBIN TOTAL (MG/DL) IN SER/PLAS: 0.4 mg/dL (ref 0.0–1.2)
BLOOD UREA NITROGEN: 14 mg/dL (ref 6–24)
BUN / CREAT RATIO: 18 (ref 9–23)
CALCIUM: 8.6 mg/dL — ABNORMAL LOW (ref 8.7–10.2)
CHLORIDE: 100 mmol/L (ref 96–106)
CO2: 24 mmol/L (ref 20–29)
CREATININE: 0.79 mg/dL (ref 0.57–1.00)
EGFR: 93 mL/min/{1.73_m2}
GLOBULIN, TOTAL: 1.4 g/dL — ABNORMAL LOW (ref 1.5–4.5)
GLUCOSE: 257 mg/dL — ABNORMAL HIGH (ref 70–99)
POTASSIUM: 4.3 mmol/L (ref 3.5–5.2)
SODIUM: 139 mmol/L (ref 134–144)
TOTAL PROTEIN: 5.7 g/dL — ABNORMAL LOW (ref 6.0–8.5)

## 2023-08-18 LAB — PHOSPHORUS: PHOSPHORUS, SERUM: 3.1 mg/dL (ref 3.0–4.3)

## 2023-08-18 LAB — GAMMA GT: GAMMA GLUTAMYL TRANSFERASE: 16 IU/L (ref 0–60)

## 2023-08-18 LAB — MAGNESIUM: MAGNESIUM: 1.8 mg/dL (ref 1.6–2.3)

## 2023-08-18 LAB — BILIRUBIN, DIRECT: BILIRUBIN DIRECT: 0.14 mg/dL (ref 0.00–0.40)

## 2023-08-20 LAB — TACROLIMUS LEVEL: TACROLIMUS BLOOD: 5.6 ng/mL (ref 2.0–20.0)

## 2023-08-23 DIAGNOSIS — Z944 Liver transplant status: Principal | ICD-10-CM

## 2023-08-23 DIAGNOSIS — Z796 Long-term use of immunosuppressant medication: Principal | ICD-10-CM

## 2023-08-23 DIAGNOSIS — Z5181 Encounter for therapeutic drug level monitoring: Principal | ICD-10-CM

## 2023-08-23 DIAGNOSIS — E119 Type 2 diabetes mellitus without complications: Principal | ICD-10-CM

## 2023-08-23 DIAGNOSIS — E612 Magnesium deficiency: Principal | ICD-10-CM

## 2023-08-23 MED ORDER — TACROLIMUS 1 MG CAPSULE, IMMEDIATE-RELEASE
ORAL_CAPSULE | Freq: Two times a day (BID) | ORAL | 11 refills | 30.00 days | Status: CP
Start: 2023-08-23 — End: ?
  Filled 2023-08-30: qty 180, 30d supply, fill #0

## 2023-08-23 MED ORDER — EMPAGLIFLOZIN 10 MG TABLET
ORAL_TABLET | Freq: Every day | ORAL | 3 refills | 90.00 days | Status: CP
Start: 2023-08-23 — End: ?

## 2023-08-23 NOTE — Unmapped (Signed)
Patient's 12/24 Tac level was again subpar and glucose was at >250. Reached out to PharmD Chargualaf, who recommended increasing her  Tac to 3 mg BID and starting, Jardiance 10 mg once daily for diabetes and set up a virtual pharmacist visit for 2-3 weeks out on a Monday for DM management.     Spoke to patient and relayed Tac dose change and recommendation for Jardiance. Patient was concerned about this new medication and SE, which can included increased UO and potential for UTI/yeast infections. She requested return call with a Spanish interpreter to discuss this further.    Reached out to [employer] and obtained the assistance of Zollie Beckers [phone]). Called patient back and explained again about the new medication and potential SE, encouraging her to reach out to the txp team if she develops concerning SE. Let her know a virtual pharmacy appt was secured on 1/6. She verbalized understanding of all discussed.

## 2023-08-24 NOTE — Unmapped (Addendum)
[facility] Pharmacist has reviewed a new prescription for tacrolimus that indicates a dose increase.  Patient was counseled on this dosage change by [facility]- see epic note from 08/23/23.  Next refill call date adjusted if necessary.      Clinical Assessment Needed For: Dose Change  Medication: Tacrolimus 1mg  capsule  Last Fill Date/Day Supply: 07/30/2023 / 30 days  Copay $0  Was previous dose already scheduled to fill: No    Notes to Pharmacist: N/A

## 2023-08-25 LAB — CBC W/ DIFFERENTIAL
BASOPHILS ABSOLUTE COUNT: 0 10*3/uL (ref 0.0–0.2)
BASOPHILS RELATIVE PERCENT: 0 %
EOSINOPHILS ABSOLUTE COUNT: 0.1 10*3/uL (ref 0.0–0.4)
EOSINOPHILS RELATIVE PERCENT: 3 %
HEMATOCRIT: 40.3 % (ref 34.0–46.6)
HEMOGLOBIN: 12.6 g/dL (ref 11.1–15.9)
LYMPHOCYTES ABSOLUTE COUNT: 0.9 10*3/uL (ref 0.7–3.1)
LYMPHOCYTES RELATIVE PERCENT: 38 %
MEAN CORPUSCULAR HEMOGLOBIN CONC: 31.3 g/dL — ABNORMAL LOW (ref 31.5–35.7)
MEAN CORPUSCULAR HEMOGLOBIN: 26.1 pg — ABNORMAL LOW (ref 26.6–33.0)
MEAN CORPUSCULAR VOLUME: 84 fL (ref 79–97)
MONOCYTES ABSOLUTE COUNT: 0.1 10*3/uL (ref 0.1–0.9)
MONOCYTES RELATIVE PERCENT: 5 %
NEUTROPHILS ABSOLUTE COUNT: 1.2 10*3/uL — ABNORMAL LOW (ref 1.4–7.0)
NEUTROPHILS RELATIVE PERCENT: 54 %
PLATELET COUNT: 88 10*3/uL — CL (ref 150–450)
RED BLOOD CELL COUNT: 4.82 x10E6/uL (ref 3.77–5.28)
RED CELL DISTRIBUTION WIDTH: 14.8 % (ref 11.7–15.4)
WHITE BLOOD CELL COUNT: 2.3 10*3/uL — CL (ref 3.4–10.8)

## 2023-08-25 LAB — COMPREHENSIVE METABOLIC PANEL
ALBUMIN: 3.8 g/dL — ABNORMAL LOW (ref 3.9–4.9)
ALKALINE PHOSPHATASE: 178 IU/L — ABNORMAL HIGH (ref 44–121)
ALT (SGPT): 101 IU/L — ABNORMAL HIGH (ref 0–32)
AST (SGOT): 33 IU/L (ref 0–40)
BILIRUBIN TOTAL (MG/DL) IN SER/PLAS: 0.4 mg/dL (ref 0.0–1.2)
BLOOD UREA NITROGEN: 14 mg/dL (ref 6–24)
BUN / CREAT RATIO: 19 (ref 9–23)
CALCIUM: 8.8 mg/dL (ref 8.7–10.2)
CHLORIDE: 102 mmol/L (ref 96–106)
CO2: 22 mmol/L (ref 20–29)
CREATININE: 0.75 mg/dL (ref 0.57–1.00)
EGFR: 99 mL/min/{1.73_m2}
GLOBULIN, TOTAL: 2.1 g/dL (ref 1.5–4.5)
GLUCOSE: 157 mg/dL — ABNORMAL HIGH (ref 70–99)
POTASSIUM: 4.4 mmol/L (ref 3.5–5.2)
SODIUM: 139 mmol/L (ref 134–144)
TOTAL PROTEIN: 5.9 g/dL — ABNORMAL LOW (ref 6.0–8.5)

## 2023-08-25 LAB — MAGNESIUM: MAGNESIUM: 1.9 mg/dL (ref 1.6–2.3)

## 2023-08-25 LAB — PHOSPHORUS: PHOSPHORUS, SERUM: 3.1 mg/dL (ref 3.0–4.3)

## 2023-08-25 LAB — BILIRUBIN, DIRECT: BILIRUBIN DIRECT: 0.16 mg/dL (ref 0.00–0.40)

## 2023-08-25 LAB — GAMMA GT: GAMMA GLUTAMYL TRANSFERASE: 185 IU/L — ABNORMAL HIGH (ref 0–60)

## 2023-08-26 DIAGNOSIS — Z944 Liver transplant status: Principal | ICD-10-CM

## 2023-08-26 DIAGNOSIS — R748 Abnormal levels of other serum enzymes: Principal | ICD-10-CM

## 2023-08-26 NOTE — Unmapped (Signed)
[facility] Refill Coordination Note    Specialty Medication(s) to be Shipped:   Transplant: mycophenolate mofetil 250mg , tacrolimus 1mg , and Prednisone 5mg     Other medication(s) to be shipped:  test strips, lancets, amlodipine, aspirin, carvedilol, Colace, Healthylax, Humalog, Januvia, magnesium, pantoprazole, Pen needles     Alicia Armstrong, DOB: 1976/03/16  Phone: [phone] (home) [phone] (work)      All above HIPAA information was verified with patient.     Was a Nurse, learning disability used for this call? No    Completed refill call assessment today to schedule patient's medication shipment from the [facility]  [phone]).  All relevant notes have been reviewed.     Specialty medication(s) and dose(s) confirmed: Patient reports changes to the regimen as follows: 2 tabs twice daily. Pt is confirming with provider on instructions due to a new prescription sent 12/17 that states pt is to take 1 tab twice daily.    Changes to medications: [name] reports no changes at this time.  Changes to insurance: No  New side effects reported not previously addressed with a pharmacist or physician: None reported  Questions for the pharmacist: No    Confirmed patient received a Conservation officer, historic buildings and a Surveyor, mining with first shipment. The patient will receive a drug information handout for each medication shipped and additional FDA Medication Guides as required.       DISEASE/MEDICATION-SPECIFIC INFORMATION        N/A    SPECIALTY MEDICATION ADHERENCE     Medication Adherence    Patient reported X missed doses in the last month: 0              Were doses missed due to medication being on hold? No    tacrolimus 1 mg: 5 days of medicine on hand   mycophenolate 250  mg: 5 days of medicine on hand   predniSONE 5  mg/ml: 5 days of medicine on hand       REFERRAL TO PHARMACIST     Referral to the pharmacist: Not needed      [facility]     Shipping address confirmed in Epic.       Delivery Scheduled: Yes, Expected medication delivery date: 08/31/23.     Medication will be delivered via UPS to the prescription address in Epic WAM.    Alicia Armstrong   [facility]  Specialty Technician

## 2023-08-26 NOTE — Unmapped (Signed)
Patient's 12/31 lab results with significant bump in ALT/ALP/GGT. Tac pending at this time. Also received msg from pharmacy, reporting that the patient believed she should still be taking 500mg  cellcept bid, when the dose was reduced by the pharmacist on 12/17.    Reached out to [employer], but was only able to leave VM for patient. She returned call and explained that she was operating off the old med sheet regarding her cellcept dose and had not adjusted it to 250mg  bid. She denied any recent illness or sx of v/d/fever/chills or pruritus. She reports having constipation, but has started using miralax and colace regularly and reported a brief episode of nausea the other day. Communicated lab concerns and patient status to Dr.Barritt who recommended liver US and repeat labs on Monday, and if no concerns and enzymes remain elevated, will consider liver bx. Arranged liver US for tomorrow at 2pm.     [facility] [employer], Byrd Hesselbach [phone]) and called patient to relay all information, including prep  and appt time for the [place]. She verbalized understanding and reported that the Jardiance that was recently prescribed would cost her >$1500/month, but that she has been careful about her diet and her blood sugars have not exceeded 180. Let her know this would be shared with the pharmacist for alternative recommendations if needed. Verified that she had applied the other recommendations from her 12/17 pharmacy visit, including the lantus, and insulin adjustments, since she had not been referring to the most updated med list. She said she would find the most current med list and refer to it going forward.

## 2023-08-27 ENCOUNTER — Inpatient Hospital Stay: Admit: 2023-08-27 | Discharge: 2023-08-28 | Payer: BLUE CROSS/BLUE SHIELD

## 2023-08-27 DIAGNOSIS — Z944 Liver transplant status: Principal | ICD-10-CM

## 2023-08-27 LAB — TACROLIMUS LEVEL: TACROLIMUS BLOOD: 7.8 ng/mL (ref 2.0–20.0)

## 2023-08-27 MED ORDER — EMPAGLIFLOZIN 10 MG TABLET
ORAL_TABLET | Freq: Every day | ORAL | 3 refills | 90.00 days | Status: CP
Start: 2023-08-27 — End: ?

## 2023-08-30 ENCOUNTER — Encounter: Admit: 2023-08-30 | Discharge: 2023-08-31 | Payer: BLUE CROSS/BLUE SHIELD

## 2023-08-30 DIAGNOSIS — Z944 Liver transplant status: Principal | ICD-10-CM

## 2023-08-30 DIAGNOSIS — Z796 Long-term use of immunosuppressant medication: Principal | ICD-10-CM

## 2023-08-30 DIAGNOSIS — Z5181 Encounter for therapeutic drug level monitoring: Principal | ICD-10-CM

## 2023-08-30 DIAGNOSIS — E612 Magnesium deficiency: Principal | ICD-10-CM

## 2023-08-30 MED ORDER — METFORMIN ER 500 MG TABLET,EXTENDED RELEASE 24 HR
ORAL_TABLET | ORAL | 5 refills | 34.00 days | Status: CP
Start: 2023-08-30 — End: 2024-09-05
  Filled 2023-08-30: qty 60, 34d supply, fill #0

## 2023-08-30 MED FILL — ASPIRIN 81 MG TABLET,DELAYED RELEASE: ORAL | 30 days supply | Qty: 30 | Fill #1

## 2023-08-30 MED FILL — INSULIN LISPRO (U-100) 100 UNIT/ML SUBCUTANEOUS PEN: SUBCUTANEOUS | 30 days supply | Qty: 15 | Fill #1

## 2023-08-30 MED FILL — ACCU-CHEK SOFTCLIX LANCETS: 30 days supply | Qty: 100 | Fill #1

## 2023-08-30 MED FILL — CARVEDILOL 6.25 MG TABLET: ORAL | 30 days supply | Qty: 60 | Fill #4

## 2023-08-30 MED FILL — AMLODIPINE 5 MG TABLET: ORAL | 30 days supply | Qty: 30 | Fill #4

## 2023-08-30 MED FILL — PREDNISONE 5 MG TABLET: ORAL | 30 days supply | Qty: 30 | Fill #2

## 2023-08-30 MED FILL — JANUVIA 100 MG TABLET: ORAL | 30 days supply | Qty: 30 | Fill #2

## 2023-08-30 MED FILL — ACCU-CHEK GUIDE TEST STRIPS: 25 days supply | Qty: 100 | Fill #1

## 2023-08-30 MED FILL — DOCUSATE SODIUM 100 MG CAPSULE: ORAL | 50 days supply | Qty: 100 | Fill #0

## 2023-08-30 MED FILL — MG-PLUS-PROTEIN 133 MG TABLET: ORAL | 50 days supply | Qty: 100 | Fill #2

## 2023-08-30 MED FILL — PANTOPRAZOLE 40 MG TABLET,DELAYED RELEASE: ORAL | 30 days supply | Qty: 30 | Fill #2

## 2023-08-30 NOTE — Unmapped (Signed)
 I was the supervising physician in the delivery of the service. Vickii Chafe, MD

## 2023-08-30 NOTE — Unmapped (Signed)
[facility]   PHARMACY DIABETES FOLLOW UP  08/30/2023  Alicia Armstrong  [id number]      Alicia Armstrong is a 48 y.o. 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     Other PMH significant for  PTDM    Seen by pharmacy today for: blood glucose management     Interval History:  -Alicia Armstrong is too expensive     Current Outpatient Medications   Medication Instructions    acetaminophen (TYLENOL) 325-650 mg, Oral, Every 8 hours PRN    amlodipine (NORVASC) 5 mg, Oral, Daily (standard)    aspirin (ECOTRIN) 81 mg, Oral, Daily (standard)    biotin 5,000 mcg, Oral, Daily (standard)    blood sugar diagnostic (ACCU-CHEK GUIDE TEST STRIPS) Strp Other, 4 times a day    blood-glucose meter kit Use as instructed    carvedilol (COREG) 6.25 mg, Oral, 2 times a day (standard)    docusate sodium (COLACE) 100 mg, Oral, 2 times a day PRN    empagliflozin (JARDIANCE) 10 mg, Oral, Daily (standard)    insulin lispro (HUMALOG) 4 Units, Subcutaneous, 3 times a day (AC), Take in addition to sliding scale. Max 50 units per day    JANUVIA 100 mg, Oral, Daily (standard)    lancets Misc Use to check blood sugar as directed with insulin 3 times a day & for symptoms of high or low blood sugar.    LANTUS SOLOSTAR U-100 INSULIN 18 Units, Subcutaneous, Nightly    magnesium oxide-Mg AA chelate (MAGNESIUM, AMINO ACID CHELATE,) 133 mg 1 tablet, Oral, 2 times a day (standard)    methocarbamol (ROBAXIN) 500 mg, Oral, 4 times a day PRN    mycophenolate (CELLCEPT) 250 mg, Oral, 2 times a day (standard)    pantoprazole (PROTONIX) 40 mg, Oral, Daily (standard)    pen needle, diabetic 32 gauge x 5/32 (4 mm) Ndle Use with insulin up to 4 times/day as needed.    polyethylene glycol (MIRALAX) 17 gram packet Mix 1 packet in  4 to 8 ounces of liquid and drink daily as needed.    predniSONE (DELTASONE) 5 mg, Oral, Daily (standard)    SENNA 8.6 mg tablet 1 tablet, Oral, Daily PRN    tacrolimus (PROGRAF) 3 mg, Oral, 2 times a day Diabetes:  Type: PTDM  A1C:   Lab Results   Component Value Date    A1C 6.1 (H) 06/24/2023    Goal A1c < 7  BMI: There is no height or weight on file to calculate BMI.    On a statin: No   The 10-year ASCVD risk score (Arnett DK, et al., 2019) is: 1.5%  On appropriate strength statin: No     Current diabetes medications: Lantus 18 units HS, Humalog 4 units + SSI    Hypoglycemia: No   Home blood glucose log:   FBG 100-140  Pre lunch/dinner: 140-190    Plan:    Medication changes today:   1. Start metformin XR 500 mg daily x7d then increase to BID      Follow up items:  Statin at future visit  2.  Consider stopping Humalog  3. GLP-1 agonist at a later date    Next visit with pharmacy in  4 weeks    During this visit, the following was completed:   BG log data assessment  Labs ordered and evaluated  simple treatment plan - 1 DS     All questions/concerns were addressed to  the patient's satisfaction.  __________________________________________  Cecilie Lowers PHARMD, CPP  [facility] CLINICAL PHARMACIST PRACTITIONER      The patient reports they are physically located in [place] and is currently: at home. I conducted a audio/video visit. I spent  5m 08s on the video call with the patient. I spent an additional 5 minutes on pre- and post-visit activities on the date of service .

## 2023-08-31 DIAGNOSIS — R188 Other ascites: Principal | ICD-10-CM

## 2023-08-31 DIAGNOSIS — K7460 Unspecified cirrhosis of liver: Principal | ICD-10-CM

## 2023-08-31 LAB — MAGNESIUM: MAGNESIUM: 1.6 mg/dL (ref 1.6–2.3)

## 2023-08-31 LAB — COMPREHENSIVE METABOLIC PANEL
ALBUMIN: 4.2 g/dL (ref 3.9–4.9)
ALKALINE PHOSPHATASE: 179 IU/L — ABNORMAL HIGH (ref 44–121)
ALT (SGPT): 42 IU/L — ABNORMAL HIGH (ref 0–32)
AST (SGOT): 19 IU/L (ref 0–40)
BILIRUBIN TOTAL (MG/DL) IN SER/PLAS: 0.3 mg/dL (ref 0.0–1.2)
BLOOD UREA NITROGEN: 12 mg/dL (ref 6–24)
BUN / CREAT RATIO: 18 (ref 9–23)
CALCIUM: 8.4 mg/dL — ABNORMAL LOW (ref 8.7–10.2)
CHLORIDE: 103 mmol/L (ref 96–106)
CO2: 24 mmol/L (ref 20–29)
CREATININE: 0.65 mg/dL (ref 0.57–1.00)
EGFR: 109 mL/min/{1.73_m2}
GLOBULIN, TOTAL: 2 g/dL (ref 1.5–4.5)
GLUCOSE: 117 mg/dL — ABNORMAL HIGH (ref 70–99)
POTASSIUM: 4 mmol/L (ref 3.5–5.2)
SODIUM: 141 mmol/L (ref 134–144)
TOTAL PROTEIN: 6.2 g/dL (ref 6.0–8.5)

## 2023-08-31 LAB — CBC W/ DIFFERENTIAL
BANDED NEUTROPHILS ABSOLUTE COUNT: 0 10*3/uL (ref 0.0–0.1)
BASOPHILS ABSOLUTE COUNT: 0 10*3/uL (ref 0.0–0.2)
BASOPHILS RELATIVE PERCENT: 0 %
EOSINOPHILS ABSOLUTE COUNT: 0.1 10*3/uL (ref 0.0–0.4)
EOSINOPHILS RELATIVE PERCENT: 2 %
HEMATOCRIT: 40.5 % (ref 34.0–46.6)
HEMOGLOBIN: 12.9 g/dL (ref 11.1–15.9)
IMMATURE GRANULOCYTES: 0 %
LYMPHOCYTES ABSOLUTE COUNT: 1 10*3/uL (ref 0.7–3.1)
LYMPHOCYTES RELATIVE PERCENT: 33 %
MEAN CORPUSCULAR HEMOGLOBIN CONC: 31.9 g/dL (ref 31.5–35.7)
MEAN CORPUSCULAR HEMOGLOBIN: 26.1 pg — ABNORMAL LOW (ref 26.6–33.0)
MEAN CORPUSCULAR VOLUME: 82 fL (ref 79–97)
MONOCYTES ABSOLUTE COUNT: 0.2 10*3/uL (ref 0.1–0.9)
MONOCYTES RELATIVE PERCENT: 7 %
NEUTROPHILS ABSOLUTE COUNT: 1.6 10*3/uL (ref 1.4–7.0)
NEUTROPHILS RELATIVE PERCENT: 58 %
PLATELET COUNT: 116 10*3/uL — ABNORMAL LOW (ref 150–450)
RED BLOOD CELL COUNT: 4.94 x10E6/uL (ref 3.77–5.28)
RED CELL DISTRIBUTION WIDTH: 14.5 % (ref 11.7–15.4)
WHITE BLOOD CELL COUNT: 2.9 10*3/uL — ABNORMAL LOW (ref 3.4–10.8)

## 2023-08-31 LAB — GAMMA GT: GAMMA GLUTAMYL TRANSFERASE: 141 IU/L — ABNORMAL HIGH (ref 0–60)

## 2023-08-31 LAB — BILIRUBIN, DIRECT: BILIRUBIN DIRECT: 0.11 mg/dL (ref 0.00–0.40)

## 2023-08-31 LAB — PHOSPHORUS: PHOSPHORUS, SERUM: 3.2 mg/dL (ref 3.0–4.3)

## 2023-08-31 NOTE — Unmapped (Addendum)
Patient left VM for TNC, reporting that her medicaid was no longer active and that her medicines were not delivered. Spoke with patient, who reported that she has one pred tab for tomorrow and Tac for tonight's dose, but she has no remaining cellcept. She said she called pharmacy and was told that she should get the delivery today. She confirmed that her medicaid was not active and said that her daughter will be calling them today, since she was unable to reach them yesterday. Encouraged her to update this TNC if she does not receive her medications this afternoon, and if she needs assistance with getting her medicaid activated. She verbalized understanding.    ALT and GGT improved with yesterday's lab results, ALP remains stable. Patient continues to report that she is feeling fine and denies any sx.

## 2023-08-31 NOTE — Unmapped (Incomplete Revision)
Patient left VM for TNC, reporting that her medicaid was no longer active and that her medicines were not delivered. Spoke with patient, who reported that she has one pred tab for tomorrow and Tac for tonight's dose, but she has no remaining cellcept. She said she called pharmacy and was told that she should get the delivery today. She confirmed that her medicaid was not active and said that her daughter will be calling them today, since she was unable to reach them yesterday. Encouraged her to update this TNC if she does not receive her medications this afternoon, and if she needs assistance with getting her medicaid activated. She verbalized understanding.    ALT and GGT improved with yesterday's lab results, ALP remains stable. Patient continues to report that she is feeling fine and denies any sx. Reviewed labs with Dr.Barritt, who recommended getting her ercp for stent removal scheduled within the next few weeks.

## 2023-09-01 LAB — TACROLIMUS LEVEL: TACROLIMUS BLOOD: 22.6 ng/mL (ref 2.0–20.0)

## 2023-09-01 NOTE — Unmapped (Signed)
ERCP  Procedure #1     Procedure #2   [id number]  MRN   Hathorn to do  Endoscopist     Is the patient's health insurance [employer], [employer], [place] [employer] (UHC), [employer] [place], [employer], or [place]?     Urgent procedure     Are you pregnant?     Are you in the process of scheduling or awaiting results of a heart ultrasound, stress test, or catheterization to evaluate new or worsening chest pain, dizziness, or shortness of breath?     Do you take: Plavix (clopidogrel), Coumadin (warfarin), Lovenox (enoxaparin), Pradaxa (dabigatran), Effient (prasugrel), Xarelto (rivaroxaban), Eliquis (apixaban), Pletal (cilostazol), or Brilinta (ticagrelor)?          Did ordering provider indicate how long to hold this medication in the order comments?          Which of the above medications are you taking?          What is the name of the medical practice that manages this medication?          What is the name of the medical provider who manages this medication?     Do you have hemophilia, von Willebrand disease, or low platelets?     Do you have a pacemaker or implanted cardiac defibrillator?     Has a [facility] provider specified the location(s)?     Which location(s) did the [facility] provider specify?          [facility]          [place]          [facility]   TRUE  Do you see a liver specialist for chronic liver disease?     Is the procedure indication for variceal screening?     Is procedure indication for variceal banding (this does NOT include variceal screening)?     Have you had a heart attack, stroke or heart stent placement within the past 6 months?     Month of event     Year of event (ONLY ENTER LAST 2 DIGITS)        5  Height (feet)   2  Height (inches)   150  Weight (pounds)   27.4  BMI          Did the ordering provider specify a bowel prep?          What bowel prep was specified?     Do you have an ostomy (bag on your stomach that collects your stool)?          Is it an ileostomy?          Is it a colostomy?          Patient doesn't know.     Do you have chronic kidney disease?     Do you have chronic constipation or have you had poor quality bowel preps for past colonoscopies?     Do you have Crohn's disease or ulcerative colitis?     Have you had weight loss surgery?          When you walk around your house or grocery store, do you have to stop and rest due to shortness of breath, chest pain, or light-headedness?     Do you ever use supplemental oxygen?     Have you been hospitalized for cirrhosis of the liver or heart failure in the last 12 months?     Have you been treated for mouth  or throat cancer with radiation or surgery?     Have you been told that it is difficult for doctors to insert a breathing tube in you during anesthesia?     Have you had a heart or lung transplant?          Are you on dialysis?     Do you have cirrhosis of the liver?     Do you have myasthenia gravis?     Is the patient a prisoner?   ################# ## ###################################################################################################################   MRN:  [id number]   Anticoag Review  No   Nurse Triage  No   GI clinic consult  No   Procedure(s):  ERCP     0   Endoscopist:  Hathorn to do   Urgent:  No   Prep:                    --------------------------- --- ----------------------------------------------------------------------------------------------------------------------------------------------------------------------------   [facility] Locations:  [facility]                  Requested Locations:              ################# ## ###################################################################################################################

## 2023-09-02 DIAGNOSIS — Z796 Long-term use of immunosuppressant medication: Principal | ICD-10-CM

## 2023-09-02 DIAGNOSIS — Z944 Liver transplant status: Principal | ICD-10-CM

## 2023-09-02 MED ORDER — TACROLIMUS 1 MG CAPSULE, IMMEDIATE-RELEASE
ORAL_CAPSULE | 11 refills | 0.00 days | Status: CP
Start: 2023-09-02 — End: ?
  Filled 2023-11-02: qty 150, 30d supply, fill #0

## 2023-09-02 NOTE — Unmapped (Addendum)
Patient's 1/6 Tac level at 22.6 after increasing her dose to 3mg  bid on 12/30.  Spoke with patient, who confirmed she had taken the correct dose and had not taken her Tac before labs were drawn. Reached out to PharmD [facility] who recommended she skip her dose tonight and return to 3mg  /2mg  daily Tac dose.Spoke with patient and relayed recommendations. She verbalized understanding and repeated back instructions. She will continue weekly labs at this time.     Noted in her appt schedule that ercp is scheduled for 1/22 for stent removal, and patient confirmed she had all the details and prep info.

## 2023-09-04 NOTE — Unmapped (Signed)
TRF

## 2023-09-05 NOTE — Unmapped (Signed)
Clinical Assessment Needed For: Dose Change  Medication: Tacrolimus 1mg  capsule  Last Fill Date/Day Supply: 08/30/2023 / 30 days  Refill Too Soon until 09/19/2023  Was previous dose already scheduled to fill: No    Notes to Pharmacist: Will re-test on 01/26

## 2023-09-06 DIAGNOSIS — Z944 Liver transplant status: Principal | ICD-10-CM

## 2023-09-06 DIAGNOSIS — Z5181 Encounter for therapeutic drug level monitoring: Principal | ICD-10-CM

## 2023-09-06 DIAGNOSIS — Z796 Long-term use of immunosuppressant medication: Principal | ICD-10-CM

## 2023-09-06 DIAGNOSIS — E612 Magnesium deficiency: Principal | ICD-10-CM

## 2023-09-08 LAB — CBC W/ DIFFERENTIAL
BANDED NEUTROPHILS ABSOLUTE COUNT: 0 10*3/uL (ref 0.0–0.1)
BASOPHILS ABSOLUTE COUNT: 0 10*3/uL (ref 0.0–0.2)
BASOPHILS RELATIVE PERCENT: 0 %
EOSINOPHILS ABSOLUTE COUNT: 0.1 10*3/uL (ref 0.0–0.4)
EOSINOPHILS RELATIVE PERCENT: 3 %
HEMATOCRIT: 41.8 % (ref 34.0–46.6)
HEMOGLOBIN: 13.8 g/dL (ref 11.1–15.9)
IMMATURE GRANULOCYTES: 0 %
LYMPHOCYTES ABSOLUTE COUNT: 0.9 10*3/uL (ref 0.7–3.1)
LYMPHOCYTES RELATIVE PERCENT: 19 %
MEAN CORPUSCULAR HEMOGLOBIN CONC: 33 g/dL (ref 31.5–35.7)
MEAN CORPUSCULAR HEMOGLOBIN: 26.4 pg — ABNORMAL LOW (ref 26.6–33.0)
MEAN CORPUSCULAR VOLUME: 80 fL (ref 79–97)
MONOCYTES ABSOLUTE COUNT: 0.4 10*3/uL (ref 0.1–0.9)
MONOCYTES RELATIVE PERCENT: 9 %
NEUTROPHILS ABSOLUTE COUNT: 3.2 10*3/uL (ref 1.4–7.0)
NEUTROPHILS RELATIVE PERCENT: 69 %
PLATELET COUNT: 135 10*3/uL — ABNORMAL LOW (ref 150–450)
RED BLOOD CELL COUNT: 5.22 x10E6/uL (ref 3.77–5.28)
RED CELL DISTRIBUTION WIDTH: 14.5 % (ref 11.7–15.4)
WHITE BLOOD CELL COUNT: 4.6 10*3/uL (ref 3.4–10.8)

## 2023-09-08 LAB — COMPREHENSIVE METABOLIC PANEL
ALBUMIN: 4.3 g/dL (ref 3.9–4.9)
ALKALINE PHOSPHATASE: 131 IU/L — ABNORMAL HIGH (ref 44–121)
ALT (SGPT): 19 IU/L (ref 0–32)
AST (SGOT): 13 IU/L (ref 0–40)
BILIRUBIN TOTAL (MG/DL) IN SER/PLAS: 0.4 mg/dL (ref 0.0–1.2)
BLOOD UREA NITROGEN: 9 mg/dL (ref 6–24)
BUN / CREAT RATIO: 13 (ref 9–23)
CALCIUM: 8.6 mg/dL — ABNORMAL LOW (ref 8.7–10.2)
CHLORIDE: 103 mmol/L (ref 96–106)
CO2: 21 mmol/L (ref 20–29)
CREATININE: 0.71 mg/dL (ref 0.57–1.00)
EGFR: 105 mL/min/{1.73_m2}
GLOBULIN, TOTAL: 2.2 g/dL (ref 1.5–4.5)
GLUCOSE: 151 mg/dL — ABNORMAL HIGH (ref 70–99)
POTASSIUM: 4 mmol/L (ref 3.5–5.2)
SODIUM: 142 mmol/L (ref 134–144)
TOTAL PROTEIN: 6.5 g/dL (ref 6.0–8.5)

## 2023-09-08 LAB — PHOSPHORUS: PHOSPHORUS, SERUM: 2.8 mg/dL — ABNORMAL LOW (ref 3.0–4.3)

## 2023-09-08 LAB — BILIRUBIN, DIRECT: BILIRUBIN DIRECT: 0.16 mg/dL (ref 0.00–0.40)

## 2023-09-08 LAB — MAGNESIUM: MAGNESIUM: 1.8 mg/dL (ref 1.6–2.3)

## 2023-09-08 LAB — GAMMA GT: GAMMA GLUTAMYL TRANSFERASE: 68 IU/L — ABNORMAL HIGH (ref 0–60)

## 2023-09-08 NOTE — Unmapped (Signed)
[facility] reached out to the patients plan to see where claims go since case manager never called back per rep in transplant case manager got a new position and no longer handles patients case. Rep stated claims should go     Agricultural consultant [employer]   [address]   [place], [place] [address]    [phone]  Email: [email or web address]

## 2023-09-11 LAB — TACROLIMUS LEVEL: TACROLIMUS BLOOD: 6.4 ng/mL (ref 2.0–20.0)

## 2023-09-13 DIAGNOSIS — E612 Magnesium deficiency: Principal | ICD-10-CM

## 2023-09-13 DIAGNOSIS — Z5181 Encounter for therapeutic drug level monitoring: Principal | ICD-10-CM

## 2023-09-13 DIAGNOSIS — Z944 Liver transplant status: Principal | ICD-10-CM

## 2023-09-13 DIAGNOSIS — Z796 Long-term use of immunosuppressant medication: Principal | ICD-10-CM

## 2023-09-15 ENCOUNTER — Ambulatory Visit: Admit: 2023-09-15 | Discharge: 2023-09-15 | Payer: BLUE CROSS/BLUE SHIELD

## 2023-09-15 ENCOUNTER — Encounter: Admit: 2023-09-15 | Discharge: 2023-09-15 | Payer: BLUE CROSS/BLUE SHIELD

## 2023-09-15 ENCOUNTER — Inpatient Hospital Stay: Admit: 2023-09-15 | Discharge: 2023-09-15 | Payer: BLUE CROSS/BLUE SHIELD

## 2023-09-15 DIAGNOSIS — Z9889 Other specified postprocedural states: Principal | ICD-10-CM

## 2023-09-15 DIAGNOSIS — Z944 Liver transplant status: Principal | ICD-10-CM

## 2023-09-15 LAB — CBC W/ DIFFERENTIAL
BANDED NEUTROPHILS ABSOLUTE COUNT: 0 10*3/uL (ref 0.0–0.1)
BASOPHILS ABSOLUTE COUNT: 0 10*3/uL (ref 0.0–0.2)
BASOPHILS RELATIVE PERCENT: 1 %
EOSINOPHILS ABSOLUTE COUNT: 0.1 10*3/uL (ref 0.0–0.4)
EOSINOPHILS RELATIVE PERCENT: 4 %
HEMATOCRIT: 42.8 % (ref 34.0–46.6)
HEMOGLOBIN: 13.4 g/dL (ref 11.1–15.9)
IMMATURE GRANULOCYTES: 0 %
LYMPHOCYTES ABSOLUTE COUNT: 1 10*3/uL (ref 0.7–3.1)
LYMPHOCYTES RELATIVE PERCENT: 31 %
MEAN CORPUSCULAR HEMOGLOBIN CONC: 31.3 g/dL — ABNORMAL LOW (ref 31.5–35.7)
MEAN CORPUSCULAR HEMOGLOBIN: 25.7 pg — ABNORMAL LOW (ref 26.6–33.0)
MEAN CORPUSCULAR VOLUME: 82 fL (ref 79–97)
MONOCYTES ABSOLUTE COUNT: 0.3 10*3/uL (ref 0.1–0.9)
MONOCYTES RELATIVE PERCENT: 8 %
NEUTROPHILS ABSOLUTE COUNT: 1.8 10*3/uL (ref 1.4–7.0)
NEUTROPHILS RELATIVE PERCENT: 56 %
PLATELET COUNT: 134 10*3/uL — ABNORMAL LOW (ref 150–450)
RED BLOOD CELL COUNT: 5.21 x10E6/uL (ref 3.77–5.28)
RED CELL DISTRIBUTION WIDTH: 14.5 % (ref 11.7–15.4)
WHITE BLOOD CELL COUNT: 3.3 10*3/uL — ABNORMAL LOW (ref 3.4–10.8)

## 2023-09-15 LAB — GAMMA GT: GAMMA GLUTAMYL TRANSFERASE: 40 IU/L (ref 0–60)

## 2023-09-15 LAB — COMPREHENSIVE METABOLIC PANEL
ALBUMIN: 4.4 g/dL (ref 3.9–4.9)
ALKALINE PHOSPHATASE: 107 IU/L (ref 44–121)
ALT (SGPT): 12 IU/L (ref 0–32)
AST (SGOT): 15 IU/L (ref 0–40)
BILIRUBIN TOTAL (MG/DL) IN SER/PLAS: 0.3 mg/dL (ref 0.0–1.2)
BLOOD UREA NITROGEN: 17 mg/dL (ref 6–24)
BUN / CREAT RATIO: 23 (ref 9–23)
CALCIUM: 8.7 mg/dL (ref 8.7–10.2)
CHLORIDE: 102 mmol/L (ref 96–106)
CO2: 23 mmol/L (ref 20–29)
CREATININE: 0.73 mg/dL (ref 0.57–1.00)
EGFR: 102 mL/min/{1.73_m2}
GLOBULIN, TOTAL: 2.1 g/dL (ref 1.5–4.5)
GLUCOSE: 99 mg/dL (ref 70–99)
POTASSIUM: 4.2 mmol/L (ref 3.5–5.2)
SODIUM: 139 mmol/L (ref 134–144)
TOTAL PROTEIN: 6.5 g/dL (ref 6.0–8.5)

## 2023-09-15 LAB — PHOSPHORUS: PHOSPHORUS, SERUM: 3.3 mg/dL (ref 3.0–4.3)

## 2023-09-15 LAB — BILIRUBIN, DIRECT: BILIRUBIN DIRECT: 0.09 mg/dL (ref 0.00–0.40)

## 2023-09-15 LAB — MAGNESIUM: MAGNESIUM: 1.8 mg/dL (ref 1.6–2.3)

## 2023-09-15 MED ADMIN — levoFLOXacin (LEVAQUIN) 500 mg/100 mL IVPB: INTRAVENOUS | @ 15:00:00 | Stop: 2023-09-15

## 2023-09-15 MED ADMIN — Propofol (DIPRIVAN) injection: INTRAVENOUS | @ 16:00:00 | Stop: 2023-09-15

## 2023-09-15 MED ADMIN — haloperidol LACTATE (HALDOL) injection: INTRAVENOUS | @ 16:00:00 | Stop: 2023-09-15

## 2023-09-15 MED ADMIN — lidocaine (PF) (XYLOCAINE-MPF) 20 mg/mL (2 %) injection: INTRAVENOUS | @ 15:00:00 | Stop: 2023-09-15

## 2023-09-15 MED ADMIN — fentaNYL (PF) (SUBLIMAZE) injection: INTRAVENOUS | @ 15:00:00 | Stop: 2023-09-15

## 2023-09-15 MED ADMIN — lactated Ringers infusion: INTRAVENOUS | @ 15:00:00 | Stop: 2023-09-15

## 2023-09-15 MED ADMIN — propofol (DIPRIVAN) infusion 10 mg/mL: INTRAVENOUS | @ 15:00:00 | Stop: 2023-09-15

## 2023-09-15 MED ADMIN — glycopyrrolate (ROBINUL) injection: INTRAVENOUS | @ 15:00:00 | Stop: 2023-09-15

## 2023-09-15 MED ADMIN — succinylcholine chloride (ANECTINE) injection: INTRAVENOUS | @ 15:00:00 | Stop: 2023-09-15

## 2023-09-15 MED ADMIN — ondansetron (ZOFRAN) injection: INTRAVENOUS | @ 16:00:00 | Stop: 2023-09-15

## 2023-09-15 MED ADMIN — dexAMETHasone (DECADRON) 4 mg/mL injection: INTRAVENOUS | @ 16:00:00 | Stop: 2023-09-15

## 2023-09-15 NOTE — Unmapped (Addendum)
 Patient completed ercp today for stent removal. Sludge was swept and stone removed. Discussed with Dr. Claudean Severance, whor ecommended continuing levaquin 500 mg daily x 4 more days, beginning tomorrow.    Spoke with patient and relayed recommendation and rationale, emphasizing that the oral abx should begin tomorrow, since she already had a dose today. She verbalized understanding and deferred need for an interpreters, stating she understood the instructions.

## 2023-09-16 LAB — TACROLIMUS LEVEL: TACROLIMUS BLOOD: 7.8 ng/mL (ref 2.0–20.0)

## 2023-09-16 MED ORDER — LEVOFLOXACIN 500 MG TABLET
ORAL_TABLET | ORAL | 0 refills | 4.00 days | Status: CP
Start: 2023-09-16 — End: 2023-09-20

## 2023-09-20 DIAGNOSIS — E612 Magnesium deficiency: Principal | ICD-10-CM

## 2023-09-20 DIAGNOSIS — Z5181 Encounter for therapeutic drug level monitoring: Principal | ICD-10-CM

## 2023-09-20 DIAGNOSIS — Z944 Liver transplant status: Principal | ICD-10-CM

## 2023-09-20 DIAGNOSIS — Z796 Long-term use of immunosuppressant medication: Principal | ICD-10-CM

## 2023-09-22 LAB — COMPREHENSIVE METABOLIC PANEL
ALBUMIN: 4.2 g/dL (ref 3.9–4.9)
ALKALINE PHOSPHATASE: 102 IU/L (ref 44–121)
ALT (SGPT): 10 IU/L (ref 0–32)
AST (SGOT): 13 IU/L (ref 0–40)
BILIRUBIN TOTAL (MG/DL) IN SER/PLAS: 0.3 mg/dL (ref 0.0–1.2)
BLOOD UREA NITROGEN: 15 mg/dL (ref 6–24)
BUN / CREAT RATIO: 21 (ref 9–23)
CALCIUM: 8.7 mg/dL (ref 8.7–10.2)
CHLORIDE: 104 mmol/L (ref 96–106)
CO2: 23 mmol/L (ref 20–29)
CREATININE: 0.71 mg/dL (ref 0.57–1.00)
EGFR: 105 mL/min/{1.73_m2}
GLOBULIN, TOTAL: 2.1 g/dL (ref 1.5–4.5)
GLUCOSE: 122 mg/dL — ABNORMAL HIGH (ref 70–99)
POTASSIUM: 3.8 mmol/L (ref 3.5–5.2)
SODIUM: 140 mmol/L (ref 134–144)
TOTAL PROTEIN: 6.3 g/dL (ref 6.0–8.5)

## 2023-09-22 LAB — MAGNESIUM: MAGNESIUM: 1.8 mg/dL (ref 1.6–2.3)

## 2023-09-22 LAB — CBC W/ DIFFERENTIAL
BANDED NEUTROPHILS ABSOLUTE COUNT: 0 10*3/uL (ref 0.0–0.1)
BASOPHILS ABSOLUTE COUNT: 0 10*3/uL (ref 0.0–0.2)
BASOPHILS RELATIVE PERCENT: 0 %
EOSINOPHILS ABSOLUTE COUNT: 0.2 10*3/uL (ref 0.0–0.4)
EOSINOPHILS RELATIVE PERCENT: 4 %
HEMATOCRIT: 43.7 % (ref 34.0–46.6)
HEMOGLOBIN: 14.1 g/dL (ref 11.1–15.9)
IMMATURE GRANULOCYTES: 0 %
LYMPHOCYTES ABSOLUTE COUNT: 1.3 10*3/uL (ref 0.7–3.1)
LYMPHOCYTES RELATIVE PERCENT: 28 %
MEAN CORPUSCULAR HEMOGLOBIN CONC: 32.3 g/dL (ref 31.5–35.7)
MEAN CORPUSCULAR HEMOGLOBIN: 26.1 pg — ABNORMAL LOW (ref 26.6–33.0)
MEAN CORPUSCULAR VOLUME: 81 fL (ref 79–97)
MONOCYTES ABSOLUTE COUNT: 0.3 10*3/uL (ref 0.1–0.9)
MONOCYTES RELATIVE PERCENT: 6 %
NEUTROPHILS ABSOLUTE COUNT: 2.9 10*3/uL (ref 1.4–7.0)
NEUTROPHILS RELATIVE PERCENT: 62 %
PLATELET COUNT: 133 10*3/uL — ABNORMAL LOW (ref 150–450)
RED BLOOD CELL COUNT: 5.41 x10E6/uL — ABNORMAL HIGH (ref 3.77–5.28)
RED CELL DISTRIBUTION WIDTH: 15 % (ref 11.7–15.4)
WHITE BLOOD CELL COUNT: 4.8 10*3/uL (ref 3.4–10.8)

## 2023-09-22 LAB — GAMMA GT: GAMMA GLUTAMYL TRANSFERASE: 27 IU/L (ref 0–60)

## 2023-09-22 LAB — PHOSPHORUS: PHOSPHORUS, SERUM: 2.8 mg/dL — ABNORMAL LOW (ref 3.0–4.3)

## 2023-09-22 LAB — BILIRUBIN, DIRECT: BILIRUBIN DIRECT: 0.11 mg/dL (ref 0.00–0.40)

## 2023-09-23 LAB — TACROLIMUS LEVEL: TACROLIMUS BLOOD: 8.1 ng/mL (ref 2.0–20.0)

## 2023-09-27 ENCOUNTER — Encounter: Admit: 2023-09-27 | Discharge: 2023-09-28 | Payer: PRIVATE HEALTH INSURANCE

## 2023-09-27 DIAGNOSIS — Z5181 Encounter for therapeutic drug level monitoring: Principal | ICD-10-CM

## 2023-09-27 DIAGNOSIS — E612 Magnesium deficiency: Principal | ICD-10-CM

## 2023-09-27 DIAGNOSIS — Z796 Long-term use of immunosuppressant medication: Principal | ICD-10-CM

## 2023-09-27 DIAGNOSIS — Z944 Liver transplant status: Principal | ICD-10-CM

## 2023-09-27 MED ORDER — METFORMIN ER 500 MG TABLET,EXTENDED RELEASE 24 HR
ORAL_TABLET | Freq: Two times a day (BID) | ORAL | 11 refills | 30.00 days | Status: CP
Start: 2023-09-27 — End: ?
  Filled 2023-10-01: qty 120, 30d supply, fill #0

## 2023-09-27 NOTE — Unmapped (Signed)
[facility]   PHARMACY DIABETES FOLLOW UP  09/27/2023  Alicia Armstrong  [id number]      Alicia Armstrong is a 48 y.o. 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     Other PMH significant for  PTDM    Seen by pharmacy today for: blood glucose management     Interval History:  -Started metformin 1 month ago    Current Outpatient Medications   Medication Instructions    acetaminophen (TYLENOL) 325-650 mg, Oral, Every 8 hours PRN    amlodipine (NORVASC) 5 mg, Oral, Daily (standard)    aspirin (ECOTRIN) 81 mg, Oral, Daily (standard)    biotin 5,000 mcg, Oral, Daily (standard)    blood sugar diagnostic (ACCU-CHEK GUIDE TEST STRIPS) Strp Other, 4 times a day    blood-glucose meter kit Use as instructed    carvedilol (COREG) 6.25 mg, Oral, 2 times a day (standard)    docusate sodium (COLACE) 100 mg, Oral, 2 times a day PRN    empagliflozin (JARDIANCE) 10 mg, Oral, Daily (standard)    insulin lispro (HUMALOG) 4 Units, Subcutaneous, 3 times a day (AC), Take in addition to sliding scale. Max 50 units per day    JANUVIA 100 mg, Oral, Daily (standard)    lancets Misc Use to check blood sugar as directed with insulin 3 times a day & for symptoms of high or low blood sugar.    LANTUS SOLOSTAR U-100 INSULIN 18 Units, Subcutaneous, Nightly    magnesium oxide-Mg AA chelate (MAGNESIUM, AMINO ACID CHELATE,) 133 mg 1 tablet, Oral, 2 times a day (standard)    metFORMIN (GLUCOPHAGE-XR) 500 MG 24 hr tablet Take 1 tablet (500 mg total) by mouth daily for 7 days, THEN 1 tablet (500 mg total) two (2) times a day.    methocarbamol (ROBAXIN) 500 mg, Oral, 4 times a day PRN    mycophenolate (CELLCEPT) 250 mg, Oral, 2 times a day (standard)    pantoprazole (PROTONIX) 40 mg, Oral, Daily (standard)    pen needle, diabetic 32 gauge x 5/32 (4 mm) Ndle Use with insulin up to 4 times/day as needed.    polyethylene glycol (MIRALAX) 17 gram packet Mix 1 packet in  4 to 8 ounces of liquid and drink daily as needed. predniSONE (DELTASONE) 5 mg, Oral, Daily (standard)    SENNA 8.6 mg tablet 1 tablet, Oral, Daily PRN    tacrolimus (PROGRAF) 1 MG capsule Take 3 capsules (3 mg total) by mouth daily AND 2 capsules (2 mg total) nightly.       Diabetes:  Type: PTDM  A1C:   Lab Results   Component Value Date    A1C 6.1 (H) 06/24/2023    Goal A1c < 7  BMI: There is no height or weight on file to calculate BMI.    On a statin: No   The 10-year ASCVD risk score (Arnett DK, et al., 2019) is: 1.5%  On appropriate strength statin: No     Current diabetes medications: Lantus 18 units HS, Humalog 4 units + SSI    Hypoglycemia: No   Home blood glucose log:   FBG 121  Pre lunch/dinner: 130-140    Plan:    Medication changes today:   1. Increase metformin XR to 1g BID   2. Stop Humalog    Follow up items:  Statin at future visit  2.  GLP-1 agonist at a later date    Next visit with pharmacy  in  4 weeks    During this visit, the following was completed:   BG log data assessment  Labs ordered and evaluated  simple treatment plan - 1 DS     All questions/concerns were addressed to the patient's satisfaction.  __________________________________________  Cecilie Lowers PHARMD, CPP  [facility] CLINICAL PHARMACIST PRACTITIONER    .    The patient reports they are physically located in [place] and is currently: at home. I conducted a phone visit.  I spent 8 minutes on the phone call with the patient on the date of service .

## 2023-09-27 NOTE — Unmapped (Signed)
Therapy Update Follow Up: No issues - Copay = $36.70

## 2023-09-27 NOTE — Unmapped (Signed)
 I was the supervising physician in the delivery of the service. Vickii Chafe, MD

## 2023-09-29 LAB — COMPREHENSIVE METABOLIC PANEL
ALBUMIN: 4.1 g/dL (ref 3.9–4.9)
ALKALINE PHOSPHATASE: 80 IU/L (ref 44–121)
ALT (SGPT): 10 IU/L (ref 0–32)
AST (SGOT): 11 IU/L (ref 0–40)
BILIRUBIN TOTAL (MG/DL) IN SER/PLAS: 0.4 mg/dL (ref 0.0–1.2)
BLOOD UREA NITROGEN: 11 mg/dL (ref 6–24)
BUN / CREAT RATIO: 15 (ref 9–23)
CALCIUM: 8.4 mg/dL — ABNORMAL LOW (ref 8.7–10.2)
CHLORIDE: 106 mmol/L (ref 96–106)
CO2: 22 mmol/L (ref 20–29)
CREATININE: 0.74 mg/dL (ref 0.57–1.00)
EGFR: 100 mL/min/{1.73_m2}
GLOBULIN, TOTAL: 1.9 g/dL (ref 1.5–4.5)
GLUCOSE: 132 mg/dL — ABNORMAL HIGH (ref 70–99)
POTASSIUM: 3.8 mmol/L (ref 3.5–5.2)
SODIUM: 142 mmol/L (ref 134–144)
TOTAL PROTEIN: 6 g/dL (ref 6.0–8.5)

## 2023-09-29 LAB — CBC W/ DIFFERENTIAL
BANDED NEUTROPHILS ABSOLUTE COUNT: 0 10*3/uL (ref 0.0–0.1)
BASOPHILS ABSOLUTE COUNT: 0 10*3/uL (ref 0.0–0.2)
BASOPHILS RELATIVE PERCENT: 1 %
EOSINOPHILS ABSOLUTE COUNT: 0.2 10*3/uL (ref 0.0–0.4)
EOSINOPHILS RELATIVE PERCENT: 6 %
HEMATOCRIT: 38.1 % (ref 34.0–46.6)
HEMOGLOBIN: 12.5 g/dL (ref 11.1–15.9)
IMMATURE GRANULOCYTES: 0 %
LYMPHOCYTES ABSOLUTE COUNT: 0.9 10*3/uL (ref 0.7–3.1)
LYMPHOCYTES RELATIVE PERCENT: 35 %
MEAN CORPUSCULAR HEMOGLOBIN CONC: 32.8 g/dL (ref 31.5–35.7)
MEAN CORPUSCULAR HEMOGLOBIN: 26.7 pg (ref 26.6–33.0)
MEAN CORPUSCULAR VOLUME: 81 fL (ref 79–97)
MONOCYTES ABSOLUTE COUNT: 0.2 10*3/uL (ref 0.1–0.9)
MONOCYTES RELATIVE PERCENT: 8 %
NEUTROPHILS ABSOLUTE COUNT: 1.3 10*3/uL — ABNORMAL LOW (ref 1.4–7.0)
NEUTROPHILS RELATIVE PERCENT: 50 %
PLATELET COUNT: 115 10*3/uL — ABNORMAL LOW (ref 150–450)
RED BLOOD CELL COUNT: 4.69 x10E6/uL (ref 3.77–5.28)
RED CELL DISTRIBUTION WIDTH: 14.7 % (ref 11.7–15.4)
WHITE BLOOD CELL COUNT: 2.6 10*3/uL — ABNORMAL LOW (ref 3.4–10.8)

## 2023-09-29 LAB — MAGNESIUM: MAGNESIUM: 2.1 mg/dL (ref 1.6–2.3)

## 2023-09-29 LAB — PHOSPHORUS: PHOSPHORUS, SERUM: 2.5 mg/dL — ABNORMAL LOW (ref 3.0–4.3)

## 2023-09-29 LAB — GAMMA GT: GAMMA GLUTAMYL TRANSFERASE: 19 IU/L (ref 0–60)

## 2023-09-29 LAB — BILIRUBIN, DIRECT: BILIRUBIN DIRECT: 0.12 mg/dL (ref 0.00–0.40)

## 2023-09-30 LAB — TACROLIMUS LEVEL: TACROLIMUS BLOOD: 8.4 ng/mL (ref 5.0–20.0)

## 2023-10-01 MED FILL — PREDNISONE 5 MG TABLET: ORAL | 30 days supply | Qty: 30 | Fill #3

## 2023-10-01 MED FILL — JANUVIA 100 MG TABLET: ORAL | 30 days supply | Qty: 30 | Fill #3

## 2023-10-01 MED FILL — MG-PLUS-PROTEIN 133 MG TABLET: ORAL | 50 days supply | Qty: 100 | Fill #3

## 2023-10-01 MED FILL — PANTOPRAZOLE 40 MG TABLET,DELAYED RELEASE: ORAL | 30 days supply | Qty: 30 | Fill #3

## 2023-10-01 MED FILL — CARVEDILOL 6.25 MG TABLET: ORAL | 30 days supply | Qty: 60 | Fill #5

## 2023-10-01 MED FILL — AMLODIPINE 5 MG TABLET: ORAL | 30 days supply | Qty: 30 | Fill #5

## 2023-10-01 MED FILL — ASPIRIN 81 MG TABLET,DELAYED RELEASE: ORAL | 30 days supply | Qty: 30 | Fill #2

## 2023-10-04 DIAGNOSIS — Z944 Liver transplant status: Principal | ICD-10-CM

## 2023-10-04 DIAGNOSIS — Z796 Long-term use of immunosuppressant medication: Principal | ICD-10-CM

## 2023-10-04 DIAGNOSIS — E612 Magnesium deficiency: Principal | ICD-10-CM

## 2023-10-04 DIAGNOSIS — Z5181 Encounter for therapeutic drug level monitoring: Principal | ICD-10-CM

## 2023-10-06 LAB — COMPREHENSIVE METABOLIC PANEL
ALBUMIN: 4.4 g/dL (ref 3.9–4.9)
ALKALINE PHOSPHATASE: 89 IU/L (ref 44–121)
ALT (SGPT): 10 IU/L (ref 0–32)
AST (SGOT): 12 IU/L (ref 0–40)
BILIRUBIN TOTAL (MG/DL) IN SER/PLAS: 0.4 mg/dL (ref 0.0–1.2)
BLOOD UREA NITROGEN: 18 mg/dL (ref 6–24)
BUN / CREAT RATIO: 24 — ABNORMAL HIGH (ref 9–23)
CALCIUM: 8.9 mg/dL (ref 8.7–10.2)
CHLORIDE: 102 mmol/L (ref 96–106)
CO2: 23 mmol/L (ref 20–29)
CREATININE: 0.75 mg/dL (ref 0.57–1.00)
EGFR: 99 mL/min/{1.73_m2}
GLOBULIN, TOTAL: 2.2 g/dL (ref 1.5–4.5)
GLUCOSE: 132 mg/dL — ABNORMAL HIGH (ref 70–99)
POTASSIUM: 4.2 mmol/L (ref 3.5–5.2)
SODIUM: 140 mmol/L (ref 134–144)
TOTAL PROTEIN: 6.6 g/dL (ref 6.0–8.5)

## 2023-10-06 LAB — CBC W/ DIFFERENTIAL
BANDED NEUTROPHILS ABSOLUTE COUNT: 0 10*3/uL (ref 0.0–0.1)
BASOPHILS ABSOLUTE COUNT: 0 10*3/uL (ref 0.0–0.2)
BASOPHILS RELATIVE PERCENT: 0 %
EOSINOPHILS ABSOLUTE COUNT: 0.2 10*3/uL (ref 0.0–0.4)
EOSINOPHILS RELATIVE PERCENT: 6 %
HEMATOCRIT: 40.1 % (ref 34.0–46.6)
HEMOGLOBIN: 13.4 g/dL (ref 11.1–15.9)
IMMATURE GRANULOCYTES: 0 %
LYMPHOCYTES ABSOLUTE COUNT: 1 10*3/uL (ref 0.7–3.1)
LYMPHOCYTES RELATIVE PERCENT: 30 %
MEAN CORPUSCULAR HEMOGLOBIN CONC: 33.4 g/dL (ref 31.5–35.7)
MEAN CORPUSCULAR HEMOGLOBIN: 26.7 pg (ref 26.6–33.0)
MEAN CORPUSCULAR VOLUME: 80 fL (ref 79–97)
MONOCYTES ABSOLUTE COUNT: 0.2 10*3/uL (ref 0.1–0.9)
MONOCYTES RELATIVE PERCENT: 7 %
NEUTROPHILS ABSOLUTE COUNT: 1.8 10*3/uL (ref 1.4–7.0)
NEUTROPHILS RELATIVE PERCENT: 57 %
PLATELET COUNT: 108 10*3/uL — ABNORMAL LOW (ref 150–450)
RED BLOOD CELL COUNT: 5.02 x10E6/uL (ref 3.77–5.28)
RED CELL DISTRIBUTION WIDTH: 14.3 % (ref 11.7–15.4)
WHITE BLOOD CELL COUNT: 3.2 10*3/uL — ABNORMAL LOW (ref 3.4–10.8)

## 2023-10-06 LAB — BILIRUBIN, DIRECT: BILIRUBIN DIRECT: 0.11 mg/dL (ref 0.00–0.40)

## 2023-10-06 LAB — MAGNESIUM: MAGNESIUM: 1.8 mg/dL (ref 1.6–2.3)

## 2023-10-06 LAB — PHOSPHORUS: PHOSPHORUS, SERUM: 2.9 mg/dL — ABNORMAL LOW (ref 3.0–4.3)

## 2023-10-06 LAB — GAMMA GT: GAMMA GLUTAMYL TRANSFERASE: 19 IU/L (ref 0–60)

## 2023-10-07 LAB — TACROLIMUS LEVEL: TACROLIMUS BLOOD: 6.4 ng/mL (ref 5.0–20.0)

## 2023-10-11 DIAGNOSIS — E612 Magnesium deficiency: Principal | ICD-10-CM

## 2023-10-11 DIAGNOSIS — Z5181 Encounter for therapeutic drug level monitoring: Principal | ICD-10-CM

## 2023-10-11 DIAGNOSIS — Z944 Liver transplant status: Principal | ICD-10-CM

## 2023-10-11 DIAGNOSIS — Z796 Long-term use of immunosuppressant medication: Principal | ICD-10-CM

## 2023-10-13 LAB — CBC W/ DIFFERENTIAL
BANDED NEUTROPHILS ABSOLUTE COUNT: 0 10*3/uL (ref 0.0–0.1)
BASOPHILS ABSOLUTE COUNT: 0 10*3/uL (ref 0.0–0.2)
BASOPHILS RELATIVE PERCENT: 0 %
EOSINOPHILS ABSOLUTE COUNT: 0.1 10*3/uL (ref 0.0–0.4)
EOSINOPHILS RELATIVE PERCENT: 3 %
HEMATOCRIT: 40.1 % (ref 34.0–46.6)
HEMOGLOBIN: 13.2 g/dL (ref 11.1–15.9)
IMMATURE GRANULOCYTES: 0 %
LYMPHOCYTES ABSOLUTE COUNT: 0.6 10*3/uL — ABNORMAL LOW (ref 0.7–3.1)
LYMPHOCYTES RELATIVE PERCENT: 21 %
MEAN CORPUSCULAR HEMOGLOBIN CONC: 32.9 g/dL (ref 31.5–35.7)
MEAN CORPUSCULAR HEMOGLOBIN: 26.7 pg (ref 26.6–33.0)
MEAN CORPUSCULAR VOLUME: 81 fL (ref 79–97)
MONOCYTES ABSOLUTE COUNT: 0.2 10*3/uL (ref 0.1–0.9)
MONOCYTES RELATIVE PERCENT: 9 %
NEUTROPHILS ABSOLUTE COUNT: 1.7 10*3/uL (ref 1.4–7.0)
NEUTROPHILS RELATIVE PERCENT: 67 %
PLATELET COUNT: 98 10*3/uL — CL (ref 150–450)
RED BLOOD CELL COUNT: 4.94 x10E6/uL (ref 3.77–5.28)
RED CELL DISTRIBUTION WIDTH: 14.2 % (ref 11.7–15.4)
WHITE BLOOD CELL COUNT: 2.6 10*3/uL — ABNORMAL LOW (ref 3.4–10.8)

## 2023-10-13 LAB — COMPREHENSIVE METABOLIC PANEL
ALBUMIN: 4.2 g/dL (ref 3.9–4.9)
ALKALINE PHOSPHATASE: 95 IU/L (ref 44–121)
ALT (SGPT): 12 IU/L (ref 0–32)
AST (SGOT): 12 IU/L (ref 0–40)
BILIRUBIN TOTAL (MG/DL) IN SER/PLAS: 0.4 mg/dL (ref 0.0–1.2)
BLOOD UREA NITROGEN: 10 mg/dL (ref 6–24)
BUN / CREAT RATIO: 14 (ref 9–23)
CALCIUM: 8.5 mg/dL — ABNORMAL LOW (ref 8.7–10.2)
CHLORIDE: 102 mmol/L (ref 96–106)
CO2: 23 mmol/L (ref 20–29)
CREATININE: 0.73 mg/dL (ref 0.57–1.00)
EGFR: 102 mL/min/{1.73_m2}
GLOBULIN, TOTAL: 1.7 g/dL (ref 1.5–4.5)
GLUCOSE: 142 mg/dL — ABNORMAL HIGH (ref 70–99)
POTASSIUM: 3.9 mmol/L (ref 3.5–5.2)
SODIUM: 140 mmol/L (ref 134–144)
TOTAL PROTEIN: 5.9 g/dL — ABNORMAL LOW (ref 6.0–8.5)

## 2023-10-13 LAB — BILIRUBIN, DIRECT: BILIRUBIN DIRECT: 0.14 mg/dL (ref 0.00–0.40)

## 2023-10-13 LAB — MAGNESIUM: MAGNESIUM: 1.7 mg/dL (ref 1.6–2.3)

## 2023-10-13 LAB — GAMMA GT: GAMMA GLUTAMYL TRANSFERASE: 14 IU/L (ref 0–60)

## 2023-10-13 LAB — PHOSPHORUS: PHOSPHORUS, SERUM: 2.6 mg/dL — ABNORMAL LOW (ref 3.0–4.3)

## 2023-10-14 LAB — TACROLIMUS LEVEL: TACROLIMUS BLOOD: 5.3 ng/mL (ref 5.0–20.0)

## 2023-10-18 DIAGNOSIS — Z5181 Encounter for therapeutic drug level monitoring: Principal | ICD-10-CM

## 2023-10-18 DIAGNOSIS — Z796 Long-term use of immunosuppressant medication: Principal | ICD-10-CM

## 2023-10-18 DIAGNOSIS — Z944 Liver transplant status: Principal | ICD-10-CM

## 2023-10-18 DIAGNOSIS — E612 Magnesium deficiency: Principal | ICD-10-CM

## 2023-10-20 LAB — CBC W/ DIFFERENTIAL
BANDED NEUTROPHILS ABSOLUTE COUNT: 0 10*3/uL (ref 0.0–0.1)
BASOPHILS ABSOLUTE COUNT: 0 10*3/uL (ref 0.0–0.2)
BASOPHILS RELATIVE PERCENT: 0 %
EOSINOPHILS ABSOLUTE COUNT: 0.1 10*3/uL (ref 0.0–0.4)
EOSINOPHILS RELATIVE PERCENT: 3 %
HEMATOCRIT: 42.7 % (ref 34.0–46.6)
HEMOGLOBIN: 13.7 g/dL (ref 11.1–15.9)
IMMATURE GRANULOCYTES: 0 %
LYMPHOCYTES ABSOLUTE COUNT: 1 10*3/uL (ref 0.7–3.1)
LYMPHOCYTES RELATIVE PERCENT: 30 %
MEAN CORPUSCULAR HEMOGLOBIN CONC: 32.1 g/dL (ref 31.5–35.7)
MEAN CORPUSCULAR HEMOGLOBIN: 26.8 pg (ref 26.6–33.0)
MEAN CORPUSCULAR VOLUME: 83 fL (ref 79–97)
MONOCYTES ABSOLUTE COUNT: 0.3 10*3/uL (ref 0.1–0.9)
MONOCYTES RELATIVE PERCENT: 9 %
NEUTROPHILS ABSOLUTE COUNT: 1.8 10*3/uL (ref 1.4–7.0)
NEUTROPHILS RELATIVE PERCENT: 58 %
PLATELET COUNT: 110 10*3/uL — ABNORMAL LOW (ref 150–450)
RED BLOOD CELL COUNT: 5.12 x10E6/uL (ref 3.77–5.28)
RED CELL DISTRIBUTION WIDTH: 14.6 % (ref 11.7–15.4)
WHITE BLOOD CELL COUNT: 3.2 10*3/uL — ABNORMAL LOW (ref 3.4–10.8)

## 2023-10-20 LAB — COMPREHENSIVE METABOLIC PANEL
ALBUMIN: 4.3 g/dL (ref 3.9–4.9)
ALKALINE PHOSPHATASE: 93 IU/L (ref 44–121)
ALT (SGPT): 15 IU/L (ref 0–32)
AST (SGOT): 12 IU/L (ref 0–40)
BILIRUBIN TOTAL (MG/DL) IN SER/PLAS: 0.3 mg/dL (ref 0.0–1.2)
BLOOD UREA NITROGEN: 15 mg/dL (ref 6–24)
BUN / CREAT RATIO: 23 (ref 9–23)
CALCIUM: 8.9 mg/dL (ref 8.7–10.2)
CHLORIDE: 103 mmol/L (ref 96–106)
CO2: 21 mmol/L (ref 20–29)
CREATININE: 0.66 mg/dL (ref 0.57–1.00)
EGFR: 109 mL/min/{1.73_m2}
GLOBULIN, TOTAL: 1.8 g/dL (ref 1.5–4.5)
GLUCOSE: 136 mg/dL — ABNORMAL HIGH (ref 70–99)
POTASSIUM: 4.2 mmol/L (ref 3.5–5.2)
SODIUM: 141 mmol/L (ref 134–144)
TOTAL PROTEIN: 6.1 g/dL (ref 6.0–8.5)

## 2023-10-20 LAB — GAMMA GT: GAMMA GLUTAMYL TRANSFERASE: 14 IU/L (ref 0–60)

## 2023-10-20 LAB — MAGNESIUM: MAGNESIUM: 1.7 mg/dL (ref 1.6–2.3)

## 2023-10-20 LAB — BILIRUBIN, DIRECT: BILIRUBIN DIRECT: 0.12 mg/dL (ref 0.00–0.40)

## 2023-10-20 LAB — PHOSPHORUS: PHOSPHORUS, SERUM: 3.1 mg/dL (ref 3.0–4.3)

## 2023-10-21 LAB — TACROLIMUS LEVEL: TACROLIMUS BLOOD: 7.4 ng/mL (ref 5.0–20.0)

## 2023-10-21 NOTE — Unmapped (Signed)
 Patient's 2/25 lab results continue to be stable. Discussed with Dr.Barritt, who approved of her reducing her lab frequency to every 2 wks. Spoke with patient and relayed recommendation. She verbalized understanding and repeated back instruction.

## 2023-10-25 DIAGNOSIS — Z944 Liver transplant status: Principal | ICD-10-CM

## 2023-10-25 DIAGNOSIS — E612 Magnesium deficiency: Principal | ICD-10-CM

## 2023-10-25 DIAGNOSIS — Z5181 Encounter for therapeutic drug level monitoring: Principal | ICD-10-CM

## 2023-10-25 DIAGNOSIS — Z796 Long-term use of immunosuppressant medication: Principal | ICD-10-CM

## 2023-10-28 NOTE — Unmapped (Signed)
 [facility] Refill Coordination Note    Specialty Medication(s) to be Shipped:   Transplant: tacrolimus 1mg     Other medication(s) to be shipped:  lancets, test strips, pantoprazole, metformin, docusate sodium, aspirin, carvedilol, amlodipine, januvia and prednisone     Alicia Armstrong, DOB: 1975/11/24  Phone: [phone] (home) [phone] (work)      All above HIPAA information was verified with patient.     Was a Nurse, learning disability used for this call? No    Completed refill call assessment today to schedule patient's medication shipment from the [facility]  [phone]).  All relevant notes have been reviewed.     Specialty medication(s) and dose(s) confirmed: Regimen is correct and unchanged.   Changes to medications: Maiko reports no changes at this time.  Changes to insurance: No  New side effects reported not previously addressed with a pharmacist or physician: None reported  Questions for the pharmacist: No    Confirmed patient received a Conservation officer, historic buildings and a Surveyor, mining with first shipment. The patient will receive a drug information handout for each medication shipped and additional FDA Medication Guides as required.       DISEASE/MEDICATION-SPECIFIC INFORMATION        N/A    SPECIALTY MEDICATION ADHERENCE     Medication Adherence    Patient reported X missed doses in the last month: 0  Specialty Medication: tacrolimus 1 MG capsule (PROGRAF)  Patient is on additional specialty medications: No              Were doses missed due to medication being on hold? No     tacrolimus 1 MG capsule (PROGRAF): 4 days of medicine on hand       REFERRAL TO PHARMACIST     Referral to the pharmacist: Not needed      [facility]     Shipping address confirmed in Epic.       Delivery Scheduled: Yes, Expected medication delivery date: 11/01/2023.     Medication will be delivered via UPS to the prescription address in Epic WAM.    Craige Cotta   [facility]  Specialty Technician

## 2023-11-01 ENCOUNTER — Encounter: Admit: 2023-11-01 | Discharge: 2023-11-02 | Payer: PRIVATE HEALTH INSURANCE

## 2023-11-01 DIAGNOSIS — E612 Magnesium deficiency: Principal | ICD-10-CM

## 2023-11-01 DIAGNOSIS — Z5181 Encounter for therapeutic drug level monitoring: Principal | ICD-10-CM

## 2023-11-01 DIAGNOSIS — Z796 Long-term use of immunosuppressant medication: Principal | ICD-10-CM

## 2023-11-01 DIAGNOSIS — Z944 Liver transplant status: Principal | ICD-10-CM

## 2023-11-01 MED ORDER — SEMAGLUTIDE 0.25 MG OR 0.5 MG (2 MG/1.5 ML) SUBCUTANEOUS PEN INJECTOR
SUBCUTANEOUS | 0 refills | 56.00 days | Status: CN
Start: 2023-11-01 — End: ?

## 2023-11-01 MED ORDER — ATORVASTATIN 10 MG TABLET
ORAL_TABLET | Freq: Every day | ORAL | 3 refills | 90 days | Status: CP
Start: 2023-11-01 — End: 2024-12-05
  Filled 2023-11-02: qty 90, 90d supply, fill #0

## 2023-11-01 MED ORDER — OZEMPIC 0.25 MG OR 0.5 MG (2 MG/3 ML) SUBCUTANEOUS PEN INJECTOR
11 refills | 0.00 days | Status: CP
Start: 2023-11-01 — End: ?
  Filled 2023-12-02: qty 3, 42d supply, fill #0

## 2023-11-01 NOTE — Unmapped (Signed)
 Bradley [facility] [employer] 's Tacrolimus shipment will be sent out as a result of copay is now approved by patient/caregiver.      I have spoken with the patient  via incoming phone call and communicated the delivery change. We will reschedule the medication for the delivery date that the patient agreed upon.  We have confirmed the delivery date as 11/03/23, via ups.

## 2023-11-01 NOTE — Unmapped (Cosign Needed)
 [facility] CLINIC PHARMACY NOTE  Alicia Armstrong  [id number]    Medication changes today:   Start Atorvastatin 10mg  daily  Investigate insurance coverage/cost for Ozempic 0.25 mg weekly for 4 weeks then increase to 0.5 mg weekly    Education/Adherence tools provided today:  - Provided updated medication list  - Provided additional education on immunosuppression and transplant related medications including reviewing indications of medications, dosing and side effects  - Discussed adherence reminder tools such as cell phone alarms    Follow up items:  1. Lab follow up  2. BG plan at 11/09/23 follow up appointment    Next visit with pharmacy in 1-3 months  ____________________________________________________________________    Alicia Armstrong is a 48 y.o. 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     Immunologic Risk: first transplant    Other PMH significant for n/a    Induction: Methylpred    Post op course uncomplicated    Readmitted 7/6-03/02/23 with back pain and elevated bilirubin  Liver ultrasound showed concern for possible thrombosis or stenosis at common hepatic artery for which LMWH ppx 30 mg BID with plans for continuation for ~6 weeks.    7/15-7/17/24 admitted to [facility] from clinic with cytopenia, received dose of gCSF, 2 units PRBC, transitioned from valganciclovir to letermovir, enoxaparin increased to 40 mg BID    04/12/23: Increase MMF to 500 mg BID, decrease prednisone to 15 mg daily x7d then 10 mg daily indefinitely    Biliary stent removed 05/05/23    Rejection History:  Admitted 8/6-8/14/24 for ACR treated with high dose steroids. 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    Infection History: NTD  ___________________________________________________________________    Last seen by pharmacy 3 months ago    Interval History:   Tac reduced to 2 mg BID 12/5  Pentam 11/21  Elevated BG on labs    Seen by pharmacy today for: medication management and blood glucose management and education    CC:  Patient has no complaints today    General: No issues  Neuro: No issues  CV: No issues  Resp: No issues  GI: Constipation -improved with docusate and senna  GU: No issues  Derm: No issues - improved hair growth with biotin  Psych: No issues.    There were no vitals filed for this visit.    _________________________________________________________________    Allergies   Allergen Reactions    Penicillins Other (See Comments)     Had a rash with penicillin when she was 48 years old. She tolerated piperacillin/tazobactam 08/25/2016-08/28/2016 with itching without rash. Tolerated Augmentin.       Medications reviewed in EPIC medication station and updated today by the clinical pharmacist practitioner.    Current Outpatient Medications   Medication Instructions    acetaminophen (TYLENOL) 325-650 mg, Oral, Every 8 hours PRN    amlodipine (NORVASC) 5 mg, Oral, Daily (standard)    aspirin (ECOTRIN) 81 mg, Oral, Daily (standard)    biotin 5,000 mcg, Oral, Daily (standard)    blood sugar diagnostic (ACCU-CHEK GUIDE TEST STRIPS) Strp Other, 4 times a day    blood-glucose meter kit Use as instructed    carvedilol (COREG) 6.25 mg, Oral, 2 times a day (standard)    docusate sodium (COLACE) 100 mg, Oral, 2 times a day PRN    JANUVIA 100 mg, Oral, Daily (standard)    lancets Misc Use to check blood sugar as directed with insulin  3 times a day & for symptoms of high or low blood sugar.    LANTUS SOLOSTAR U-100 INSULIN 18 Units, Subcutaneous, Nightly    magnesium oxide-Mg AA chelate (MAGNESIUM, AMINO ACID CHELATE,) 133 mg 1 tablet, Oral, 2 times a day (standard)    metFORMIN (GLUCOPHAGE-XR) 1,000 mg, Oral, 2 times a day    methocarbamol (ROBAXIN) 500 mg, Oral, 4 times a day PRN    mycophenolate (CELLCEPT) 250 mg, Oral, 2 times a day (standard)    pantoprazole (PROTONIX) 40 mg, Oral, Daily (standard)    pen needle, diabetic 32 gauge x 5/32 (4 mm) Ndle Use with insulin up to 4 times/day as needed.    polyethylene glycol (MIRALAX) 17 gram packet Mix 1 packet in  4 to 8 ounces of liquid and drink daily as needed.    predniSONE (DELTASONE) 5 mg, Oral, Daily (standard)    SENNA 8.6 mg tablet 1 tablet, Oral, Daily PRN    tacrolimus (PROGRAF) 1 MG capsule Take 3 capsules (3 mg total) by mouth daily AND 2 capsules (2 mg total) nightly.     GRAFT FUNCTION: stable    Lab Results   Component Value Date    AST 12 10/19/2023    ALT 15 10/19/2023    Total Bilirubin 0.3 10/19/2023      Zero hour biopsy: negative for malignancy    Biopsies to date:  8/9: Minimal to mild portal inflammation, mild bile duct injury and focal endotheliitis, compatible with minimal acute cellular rejection, Banff score=3 (1+1+1)      Renal Function: stable    Lab Results   Component Value Date    Creatinine 0.66 10/19/2023    Creatinine 0.73 10/12/2023    Creatinine 0.75 10/05/2023    Creatinine 0.74 09/28/2023     Proteinuria/UPC:  none.  No results found for: PCRATIOUR      CURRENT IMMUNOSUPPRESSION:    Tacrolimus (Prograf) 3 mg every morning + 2 mg every evening    Tacrolimus Goal: 6 - 8  Mycophenolate mofetil (Cellcept) 250 mg BID   Prednisone 5 mg daily    IMMUNOSUPPRESSION DRUG LEVELS:  Lab Results   Component Value Date    Tacrolimus, Trough 16.7 (H) 03/10/2023    Tacrolimus, Trough 10.4 03/09/2023    Tacrolimus, Trough 10.7 02/23/2023    Tacrolimus Lvl 7.4 10/19/2023    Tacrolimus Lvl 5.3 10/12/2023    Tacrolimus Lvl 6.4 10/05/2023     Prograf level not assessed at today's visit. Will follow up with labs at visit next week.    Patient is tolerating immunosuppression well    WBC/ANC:   low  Lab Results   Component Value Date    WBC 3.2 (L) 10/19/2023       Plan: Will maintain current immunosuppression. Continue to monitor      OI Prophylaxis:   CMV Status: D-/ R+, moderate risk.   CMV prophylaxis:  letermovir 480 mg  x 3 months per protocol (completed 05/13/23)  Valcyte stopped for neturopenia  No results found for: CMVCP    HSV ppx: acyclovir 400 mg BID (end 05/18/23)     PCP Prophylaxis: pentamidine 300 mg inhalation monthly x 6 months (completed)     Thrush: completed in hospital    Patient is  off prophylaxis    Plan: Continue per protocol.     HAT ppx:  Meds currently on: aspirin 81 mg daily    CAD prevention:  The 10-year ASCVD risk score (Arnett DK, et al.,  2019) is: 1.5%    Values used to calculate the score:      Age: 89 years      Sex: Female      Is Non-Hispanic African American: No      Diabetic: Yes      Tobacco smoker: No      Systolic Blood Pressure: 104 mmHg      Is BP treated: Yes      HDL Cholesterol: 46 mg/dL      Total Cholesterol: 158 mg/dL    Note: For patients with SBP <90 or >200, Total Cholesterol <130 or >320, HDL <20 or >100 which are outside of the allowable range, the calculator will use these upper or lower values to calculate the patient???s risk score.     Statin therapy: Indicated; currently on no statin  Plan: Start Atorvastatin 10mg  daily. Repeat lipid panel. Continue to monitor.     BP: Goal < 140/90. Clinic vitals reported above  Home BP ranges: Assesses infrequently at home. Last readings 110-140s/60-70s    Current meds include: Amlodipine 5 mg daily, Carvedilol 6.25 mg BID  Plan: within goal. Continue to monitor    Anemia of CKD:  H/H:   Lab Results   Component Value Date    HGB 13.7 10/19/2023     Lab Results   Component Value Date    HCT 42.7 10/19/2023     Iron panel:  Lab Results   Component Value Date    IRON 51 03/22/2023    TIBC 229 (L) 03/22/2023    FERRITIN 396.4 (H) 03/08/2023     Lab Results   Component Value Date    Iron Saturation (%) 22 03/22/2023     Prior ESA use: none post txp    Plan: stable. Continue to monitor.     DM:   Lab Results   Component Value Date    A1C 6.1 (H) 06/24/2023   . Goal A1c < 7  History of Dm?  No - post txp hyperglycemia    Established with endocrinologist/PCP for BG managment? No  Currently on:   sitagliptin 100 mg daily  insulin glargine 18 units nightly  Metformin XR 1000mg  BID    Has been checking regularly at home. Fasting BGs in 100s-115s (109 today)    Diet: Pt reports increased vegetable intake and has been conscious about carb intake. Gets proteins through lean meats (chicken, fish, eggs).    Exercise: Goes to the gym (2-3x/week). Likes to walk on the treadmill and use light weights.   Hypoglycemia: No recent events    Plan: No change. Recommend check A1c at next visit (11/09/23) and update blood glucose plan as follows:   - Start GLP-1 (Ozempic 0.25mg  weekly) pending insurance approval  - Decrease basal insulin to 14u nightly (20% decrease)  - Stop Sitagliptin  - Continue Metformin    Electrolytes: wnl  Lab Results   Component Value Date    Potassium 4.2 10/19/2023    Potassium, Bld 3.8 09/07/2022    Sodium 141 10/19/2023    Sodium Whole Blood 131 (L) 09/07/2022    Magnesium 1.7 10/19/2023    CO2 21 10/19/2023     Meds currently on: magnesium plus protein 133 mg BID  Plan: No change.  Continue to monitor     GI/BM: pt reports constipation that is improved with docusate use  Meds currently on: docusate BID, Miralax daily, pantoprazole 40 mg daily  Plan: No change.  Continue to monitor  Pain: pt reports mild pain with menstrual cycle that is alleviated with tylenol use.  Meds currently on: APAP PRN, Methocarbamol PRN (not using)  Plan: No change.  Continue to monitor.    Bone health:   Vitamin D Level: none available. Goal > 30.   No results found for: VITDTOT, VITDTOTAL    Lab Results   Component Value Date    Calcium 8.9 10/19/2023    Calcium 8.5 (L) 10/12/2023     Last DEXA results:   normal in 2018  Current meds include: none  Plan: Vitamin D level  within goal (33 in Oct 2024). Continue to monitor.     Women's/Men's Health:  Kieanna Piera Downs is a 48 y.o. Female perimenopausal. Patient reports no men's/women's health issues  Plan: Continue to monitor    Immunizations:  Influenza [Annual]: Received 04/2023    PPSV23: Received 08/2016  PCV20: Received 08/2022    Shingrix Zoster [2 doses, 2 - 6 months apart]: 1st dose given 08/2022 and 2nd dose due 10/2022    COVID-19 [3 primary doses, 2 boosters]: Received 04/2023    Pharmacy preference:  [facility]  Medication Refills:  [facility] - [facility]  Medication Access:  N/a    Adherence: Patient has good understanding of medications; was able to independently identify names/doses of immunosuppressants and OI meds.  Patient  does fill their own pill box on a regular basis at home.  Patient brought medication card:no  Pill box: unable to assess via phone call  Plan: provided basic adherence counseling/intervention    During this visit, the following was completed:   BG log data assessment  BP log data assessment  Labs ordered and evaluated  complex treatment plan >1 DS   I spent a total of 35 minutes on the phone with the patient delivering clinical care and providing education/counseling.    All questions/concerns were addressed to the patient's satisfaction.    Orbie Pyo, PharmD  PGY-1 Acute Care Pharmacy Resident   __________________________________________    Hazeline Junker, PharmD, CPP  Abdominal Transplant Clinical Pharmacist Practitioner      The patient reports they are physically located in [facility] and is currently: at home. I conducted a phone visit.  I spent 35 minutes on the phone call with the patient on the date of service .

## 2023-11-01 NOTE — Unmapped (Signed)
 Alicia Armstrong 's entire shipment will be delayed as a result of credit card ending in [phone] declined     I have reached out to the patient via interpreter at [phone]  and left a voicemail message.  We will wait for a call back from the patient to reschedule the delivery.  We have not confirmed the new delivery date.     Text and MyChart messages sent.

## 2023-11-02 DIAGNOSIS — E669 Obesity, unspecified: Principal | ICD-10-CM

## 2023-11-02 DIAGNOSIS — E119 Type 2 diabetes mellitus without complications: Principal | ICD-10-CM

## 2023-11-02 MED FILL — PANTOPRAZOLE 40 MG TABLET,DELAYED RELEASE: ORAL | 30 days supply | Qty: 30 | Fill #4

## 2023-11-02 MED FILL — ASPIRIN 81 MG TABLET,DELAYED RELEASE: ORAL | 30 days supply | Qty: 30 | Fill #3

## 2023-11-02 MED FILL — JANUVIA 100 MG TABLET: ORAL | 30 days supply | Qty: 30 | Fill #4

## 2023-11-02 MED FILL — AMLODIPINE 5 MG TABLET: ORAL | 30 days supply | Qty: 30 | Fill #6

## 2023-11-02 MED FILL — ACCU-CHEK GUIDE TEST STRIPS: 25 days supply | Qty: 100 | Fill #2

## 2023-11-02 MED FILL — ACCU-CHEK SOFTCLIX LANCETS: 30 days supply | Qty: 100 | Fill #2

## 2023-11-02 MED FILL — CARVEDILOL 6.25 MG TABLET: ORAL | 30 days supply | Qty: 60 | Fill #6

## 2023-11-02 MED FILL — DOCUSATE SODIUM 100 MG CAPSULE: ORAL | 50 days supply | Qty: 100 | Fill #1

## 2023-11-02 MED FILL — PREDNISONE 5 MG TABLET: ORAL | 30 days supply | Qty: 30 | Fill #4

## 2023-11-02 MED FILL — METFORMIN ER 500 MG TABLET,EXTENDED RELEASE 24 HR: ORAL | 30 days supply | Qty: 120 | Fill #1

## 2023-11-03 LAB — CBC W/ DIFFERENTIAL
BANDED NEUTROPHILS ABSOLUTE COUNT: 0 10*3/uL (ref 0.0–0.1)
BASOPHILS ABSOLUTE COUNT: 0 10*3/uL (ref 0.0–0.2)
BASOPHILS RELATIVE PERCENT: 0 %
EOSINOPHILS ABSOLUTE COUNT: 0.1 10*3/uL (ref 0.0–0.4)
EOSINOPHILS RELATIVE PERCENT: 2 %
HEMATOCRIT: 41.2 % (ref 34.0–46.6)
HEMOGLOBIN: 13 g/dL (ref 11.1–15.9)
IMMATURE GRANULOCYTES: 0 %
LYMPHOCYTES ABSOLUTE COUNT: 1.2 10*3/uL (ref 0.7–3.1)
LYMPHOCYTES RELATIVE PERCENT: 35 %
MEAN CORPUSCULAR HEMOGLOBIN CONC: 31.6 g/dL (ref 31.5–35.7)
MEAN CORPUSCULAR HEMOGLOBIN: 26.2 pg — ABNORMAL LOW (ref 26.6–33.0)
MEAN CORPUSCULAR VOLUME: 83 fL (ref 79–97)
MONOCYTES ABSOLUTE COUNT: 0.3 10*3/uL (ref 0.1–0.9)
MONOCYTES RELATIVE PERCENT: 8 %
NEUTROPHILS ABSOLUTE COUNT: 1.8 10*3/uL (ref 1.4–7.0)
NEUTROPHILS RELATIVE PERCENT: 55 %
PLATELET COUNT: 126 10*3/uL — ABNORMAL LOW (ref 150–450)
RED BLOOD CELL COUNT: 4.97 x10E6/uL (ref 3.77–5.28)
RED CELL DISTRIBUTION WIDTH: 14.6 % (ref 11.7–15.4)
WHITE BLOOD CELL COUNT: 3.3 10*3/uL — ABNORMAL LOW (ref 3.4–10.8)

## 2023-11-03 LAB — COMPREHENSIVE METABOLIC PANEL
ALBUMIN: 4.4 g/dL (ref 3.9–4.9)
ALKALINE PHOSPHATASE: 89 IU/L (ref 44–121)
ALT (SGPT): 14 IU/L (ref 0–32)
AST (SGOT): 13 IU/L (ref 0–40)
BILIRUBIN TOTAL (MG/DL) IN SER/PLAS: 0.4 mg/dL (ref 0.0–1.2)
BLOOD UREA NITROGEN: 10 mg/dL (ref 6–24)
BUN / CREAT RATIO: 11 (ref 9–23)
CALCIUM: 8.9 mg/dL (ref 8.7–10.2)
CHLORIDE: 102 mmol/L (ref 96–106)
CO2: 23 mmol/L (ref 20–29)
CREATININE: 0.87 mg/dL (ref 0.57–1.00)
EGFR: 83 mL/min/{1.73_m2}
GLOBULIN, TOTAL: 1.9 g/dL (ref 1.5–4.5)
GLUCOSE: 129 mg/dL — ABNORMAL HIGH (ref 70–99)
POTASSIUM: 4.1 mmol/L (ref 3.5–5.2)
SODIUM: 140 mmol/L (ref 134–144)
TOTAL PROTEIN: 6.3 g/dL (ref 6.0–8.5)

## 2023-11-03 LAB — MAGNESIUM: MAGNESIUM: 1.6 mg/dL (ref 1.6–2.3)

## 2023-11-03 LAB — GAMMA GT: GAMMA GLUTAMYL TRANSFERASE: 15 IU/L (ref 0–60)

## 2023-11-03 LAB — PHOSPHORUS: PHOSPHORUS, SERUM: 3 mg/dL (ref 3.0–4.3)

## 2023-11-03 LAB — BILIRUBIN, DIRECT: BILIRUBIN DIRECT: 0.11 mg/dL (ref 0.00–0.40)

## 2023-11-03 NOTE — Unmapped (Signed)
 Received a call from [facility] [phone] with [employer], she stated she is one of the nurses who works with the pricing dept and was calling to get the pt's admit date and discharge date for her liver transplant. Information was provided to her. Admit date 02/14/23, discharge date 02/23/23. She said she will call back if she has any other additional question. She called back about post transplant admission dates and discharge dates.

## 2023-11-03 NOTE — Unmapped (Signed)
 I was the supervising physician in the delivery of the service. Vickii Chafe, MD

## 2023-11-04 LAB — TACROLIMUS LEVEL: TACROLIMUS BLOOD: 8.3 ng/mL (ref 5.0–20.0)

## 2023-11-08 DIAGNOSIS — E612 Magnesium deficiency: Principal | ICD-10-CM

## 2023-11-08 DIAGNOSIS — Z944 Liver transplant status: Principal | ICD-10-CM

## 2023-11-08 DIAGNOSIS — Z5181 Encounter for therapeutic drug level monitoring: Principal | ICD-10-CM

## 2023-11-08 DIAGNOSIS — Z796 Long-term use of immunosuppressant medication: Principal | ICD-10-CM

## 2023-11-08 NOTE — Unmapped (Signed)
 Pt called to confirm time of appts tomorrow. She asked if she needs to have labs drawn. Told her primary coord out today and this tpa will ask covering coord and call her back. She verbalized understanding.    Called pt back and told her this tpa made lab appt for 9:30 since covering coord said she did need to get labs before appt. She verbalized understanding of all discussed.

## 2023-11-09 ENCOUNTER — Ambulatory Visit
Admit: 2023-11-09 | Discharge: 2023-11-09 | Payer: PRIVATE HEALTH INSURANCE | Attending: Internal Medicine | Primary: Internal Medicine

## 2023-11-09 ENCOUNTER — Ambulatory Visit: Admit: 2023-11-09 | Discharge: 2023-11-09 | Payer: PRIVATE HEALTH INSURANCE

## 2023-11-09 DIAGNOSIS — D849 Immunodeficiency, unspecified: Principal | ICD-10-CM

## 2023-11-09 DIAGNOSIS — E119 Type 2 diabetes mellitus without complications: Principal | ICD-10-CM

## 2023-11-09 DIAGNOSIS — K635 Polyp of colon: Principal | ICD-10-CM

## 2023-11-09 DIAGNOSIS — Z944 Liver transplant status: Principal | ICD-10-CM

## 2023-11-09 LAB — CBC W/ AUTO DIFF
BASOPHILS ABSOLUTE COUNT: 0 10*9/L (ref 0.0–0.1)
BASOPHILS RELATIVE PERCENT: 0.5 %
EOSINOPHILS ABSOLUTE COUNT: 0.1 10*9/L (ref 0.0–0.5)
EOSINOPHILS RELATIVE PERCENT: 2.5 %
HEMATOCRIT: 39.6 % (ref 34.0–44.0)
HEMOGLOBIN: 13.5 g/dL (ref 11.3–14.9)
LYMPHOCYTES ABSOLUTE COUNT: 1 10*9/L — ABNORMAL LOW (ref 1.1–3.6)
LYMPHOCYTES RELATIVE PERCENT: 26.2 %
MEAN CORPUSCULAR HEMOGLOBIN CONC: 34.2 g/dL (ref 32.0–36.0)
MEAN CORPUSCULAR HEMOGLOBIN: 26.8 pg (ref 25.9–32.4)
MEAN CORPUSCULAR VOLUME: 78.4 fL (ref 77.6–95.7)
MEAN PLATELET VOLUME: 8 fL (ref 6.8–10.7)
MONOCYTES ABSOLUTE COUNT: 0.3 10*9/L (ref 0.3–0.8)
MONOCYTES RELATIVE PERCENT: 7.1 %
NEUTROPHILS ABSOLUTE COUNT: 2.5 10*9/L (ref 1.8–7.8)
NEUTROPHILS RELATIVE PERCENT: 63.7 %
PLATELET COUNT: 115 10*9/L — ABNORMAL LOW (ref 150–450)
RED BLOOD CELL COUNT: 5.05 10*12/L (ref 3.95–5.13)
RED CELL DISTRIBUTION WIDTH: 15.1 % (ref 12.2–15.2)
WBC ADJUSTED: 3.9 10*9/L (ref 3.6–11.2)

## 2023-11-09 LAB — PHOSPHORUS: PHOSPHORUS: 3.3 mg/dL (ref 2.4–5.1)

## 2023-11-09 LAB — COMPREHENSIVE METABOLIC PANEL
ALBUMIN: 4.2 g/dL (ref 3.4–5.0)
ALKALINE PHOSPHATASE: 86 U/L (ref 46–116)
ALT (SGPT): 15 U/L (ref 10–49)
ANION GAP: 8 mmol/L (ref 5–14)
AST (SGOT): 16 U/L (ref <=34)
BILIRUBIN TOTAL: 0.3 mg/dL (ref 0.3–1.2)
BLOOD UREA NITROGEN: 14 mg/dL (ref 9–23)
BUN / CREAT RATIO: 20
CALCIUM: 9.5 mg/dL (ref 8.7–10.4)
CHLORIDE: 105 mmol/L (ref 98–107)
CO2: 27 mmol/L (ref 20.0–31.0)
CREATININE: 0.71 mg/dL (ref 0.55–1.02)
EGFR CKD-EPI (2021) FEMALE: >90 mL/min/{1.73_m2} (ref >=60)
GLUCOSE RANDOM: 152 mg/dL — ABNORMAL HIGH (ref 70–99)
POTASSIUM: 4.6 mmol/L (ref 3.4–4.8)
PROTEIN TOTAL: 6.8 g/dL (ref 5.7–8.2)
SODIUM: 140 mmol/L (ref 135–145)

## 2023-11-09 LAB — BILIRUBIN, DIRECT: BILIRUBIN DIRECT: <0.1 mg/dL (ref 0.00–0.30)

## 2023-11-09 LAB — TACROLIMUS LEVEL: TACROLIMUS BLOOD: 5.3 ng/mL

## 2023-11-09 LAB — GAMMA GT: GAMMA GLUTAMYL TRANSFERASE: 20 U/L (ref 0–38)

## 2023-11-09 LAB — HEMOGLOBIN A1C
ESTIMATED AVERAGE GLUCOSE: 137 mg/dL
HEMOGLOBIN A1C: 6.4 % — ABNORMAL HIGH (ref 4.8–5.6)

## 2023-11-09 LAB — MAGNESIUM: MAGNESIUM: 1.6 mg/dL (ref 1.6–2.6)

## 2023-11-09 LAB — HCG QUANTITATIVE, BLOOD: GONADOTROPIN, CHORIONIC (HCG) QUANT: <2.6 m[IU]/mL

## 2023-11-09 NOTE — Unmapped (Signed)
 I was the supervising physician in the delivery of the service. Vickii Chafe, MD

## 2023-11-09 NOTE — Unmapped (Signed)
 [facility] CLINIC PHARMACY NOTE  Alicia Armstrong  [id number]    Medication changes today:   Investigate insurance coverage/cost for Ozempic 0.25 mg weekly for 4 weeks then increase to 0.5 mg weekly    Education/Adherence tools provided today:  - Provided updated medication list  - Provided additional education on immunosuppression and transplant related medications including reviewing indications of medications, dosing and side effects  - Discussed adherence reminder tools such as cell phone alarms    Follow up items:  1. Lab follow up  2. Insurance follow up    Next visit with pharmacy in 1-3 months  ____________________________________________________________________    Alicia Armstrong is a 48 y.o. female s/p deceased liver transplant on Feb 24, 2023 (Liver) 2/2  AIH .     Immunologic Risk: first transplant    Other PMH significant for n/a    Induction: Methylpred    Post op course uncomplicated    Readmitted 7/6-03/02/23 with back pain and elevated bilirubin  Liver ultrasound showed concern for possible thrombosis or stenosis at common hepatic artery for which LMWH ppx 30 mg BID with plans for continuation for ~6 weeks.    7/15-7/17/24 admitted to [facility] from clinic with cytopenia, received dose of gCSF, 2 units PRBC, transitioned from valganciclovir to letermovir, enoxaparin increased to 40 mg BID    04/12/23: Increase MMF to 500 mg BID, decrease prednisone to 15 mg daily x7d then 10 mg daily indefinitely    Biliary stent removed 05/05/23    Rejection History:  Admitted 8/6-8/14/24 for ACR treated with high dose steroids. 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    Infection History: NTD  ___________________________________________________________________    Last seen by pharmacy 3 months ago    Interval History:   Tac reduced to 2 mg BID 12/5  Pentam 11/21  Elevated BG on labs    Seen by pharmacy today for: medication management and blood glucose management and education    CC:  Patient has no complaints today    General: No issues  Neuro: No issues  CV: No issues  Resp: No issues  GI: No issues   GU: No issues  Derm: No issues   Psych: No issues.    Fluid intake: 80 fl oz water daily    There were no vitals filed for this visit.    _________________________________________________________________    Allergies   Allergen Reactions    Penicillins Other (See Comments)     Had a rash with penicillin when she was 48 years old. She tolerated piperacillin/tazobactam 08/25/2016-08/28/2016 with itching without rash. Tolerated Augmentin.       Medications reviewed in EPIC medication station and updated today by the clinical pharmacist practitioner.    Current Outpatient Medications   Medication Instructions    acetaminophen (TYLENOL) 325-650 mg, Oral, Every 8 hours PRN    amlodipine (NORVASC) 5 mg, Oral, Daily (standard)    aspirin (ECOTRIN) 81 mg, Oral, Daily (standard)    atorvastatin (LIPITOR) 10 mg, Oral, Daily (standard)    biotin 5,000 mcg, Oral, Daily (standard)    blood sugar diagnostic (ACCU-CHEK GUIDE TEST STRIPS) Strp Other, 4 times a day    blood-glucose meter kit Use as instructed    carvedilol (COREG) 6.25 mg, Oral, 2 times a day (standard)    docusate sodium (COLACE) 100 mg, Oral, 2 times a day PRN    JANUVIA 100 mg, Oral, Daily (standard)    lancets Misc Use to check blood sugar as  directed with insulin 3 times a day & for symptoms of high or low blood sugar.    LANTUS SOLOSTAR U-100 INSULIN 18 Units, Subcutaneous, Nightly    magnesium oxide-Mg AA chelate (MAGNESIUM, AMINO ACID CHELATE,) 133 mg 1 tablet, Oral, 2 times a day (standard)    metFORMIN (GLUCOPHAGE-XR) 1,000 mg, Oral, 2 times a day    methocarbamol (ROBAXIN) 500 mg, Oral, 4 times a day PRN    mycophenolate (CELLCEPT) 250 mg, Oral, 2 times a day (standard)    pantoprazole (PROTONIX) 40 mg, Oral, Daily (standard)    pen needle, diabetic 32 gauge x 5/32 (4 mm) Ndle Use with insulin up to 4 times/day as needed. polyethylene glycol (MIRALAX) 17 gram packet Mix 1 packet in  4 to 8 ounces of liquid and drink daily as needed.    predniSONE (DELTASONE) 5 mg, Oral, Daily (standard)    semaglutide (OZEMPIC) 0.25 mg or 0.5 mg (2 mg/3 mL) PnIj Inject 0.25 mg under the skin weekly for 4 weeks then increase to 0.5 mg weekly    SENNA 8.6 mg tablet 1 tablet, Oral, Daily PRN    tacrolimus (PROGRAF) 1 MG capsule Take 3 capsules (3 mg total) by mouth daily AND 2 capsules (2 mg total) nightly.     GRAFT FUNCTION: stable    Lab Results   Component Value Date    AST 13 11/02/2023    ALT 14 11/02/2023    Total Bilirubin 0.4 11/02/2023      Zero hour biopsy: negative for malignancy    Biopsies to date:  8/9: Minimal to mild portal inflammation, mild bile duct injury and focal endotheliitis, compatible with minimal acute cellular rejection, Banff score=3 (1+1+1)      Renal Function: stable    Lab Results   Component Value Date    Creatinine 0.87 11/02/2023    Creatinine 0.66 10/19/2023    Creatinine 0.73 10/12/2023    Creatinine 0.75 10/05/2023     Proteinuria/UPC:  none.  No results found for: PCRATIOUR      CURRENT IMMUNOSUPPRESSION:    Tacrolimus (Prograf) 3 mg every morning + 2 mg every evening    Tacrolimus Goal: 6 - 8  Mycophenolate mofetil (Cellcept) 250 mg BID   Prednisone 5 mg daily    IMMUNOSUPPRESSION DRUG LEVELS:  Lab Results   Component Value Date    Tacrolimus, Trough 16.7 (H) 03/10/2023    Tacrolimus, Trough 10.4 03/09/2023    Tacrolimus, Trough 10.7 02/23/2023    Tacrolimus Lvl 8.3 11/02/2023    Tacrolimus Lvl 7.4 10/19/2023    Tacrolimus Lvl 5.3 10/12/2023     Prograf level not assessed at today's visit. Will follow up with labs at visit next week.    Patient is tolerating immunosuppression well    WBC/ANC:   low  Lab Results   Component Value Date    WBC 3.3 (L) 11/02/2023       Plan: Will maintain current immunosuppression. Continue to monitor      OI Prophylaxis:   CMV Status: D-/ R+, moderate risk.   CMV prophylaxis: letermovir 480 mg  x 3 months per protocol (completed 05/13/23)  Valcyte stopped for neturopenia  No results found for: CMVCP    HSV ppx: acyclovir 400 mg BID (end 05/18/23)     PCP Prophylaxis: pentamidine 300 mg inhalation monthly x 6 months (completed)     Thrush: completed in hospital    Patient is  off prophylaxis    Plan: Continue per protocol.  HAT ppx:  Meds currently on: aspirin 81 mg daily    CAD prevention:  The 10-year ASCVD risk score (Arnett DK, et al., 2019) is: 1.5%    Values used to calculate the score:      Age: 68 years      Sex: Female      Is Non-Hispanic African American: No      Diabetic: Yes      Tobacco smoker: No      Systolic Blood Pressure: 104 mmHg      Is BP treated: Yes      HDL Cholesterol: 46 mg/dL      Total Cholesterol: 158 mg/dL    Note: For patients with SBP <90 or >200, Total Cholesterol <130 or >320, HDL <20 or >100 which are outside of the allowable range, the calculator will use these upper or lower values to calculate the patient???s risk score.     Statin therapy: Indicated; currently taking Atorvastatin 10mg  daily  Plan: No changes. Continue to monitor.     BP: Goal < 140/90. Clinic vitals reported above  Home BP ranges: Assesses infrequently at home. Last readings 110-140s/60-70s    Current meds include: Amlodipine 5 mg daily, Carvedilol 6.25 mg BID  Plan: within goal. Continue to monitor    Anemia of CKD:  H/H:   Lab Results   Component Value Date    HGB 13.0 11/02/2023     Lab Results   Component Value Date    HCT 41.2 11/02/2023     Iron panel:  Lab Results   Component Value Date    IRON 51 03/22/2023    TIBC 229 (L) 03/22/2023    FERRITIN 396.4 (H) 03/08/2023     Lab Results   Component Value Date    Iron Saturation (%) 22 03/22/2023     Prior ESA use: none post txp    Plan: stable. Continue to monitor.     DM:   Lab Results   Component Value Date    A1C 6.1 (H) 06/24/2023   . Goal A1c < 7  History of Dm?  No - post txp hyperglycemia    Established with endocrinologist/PCP for BG managment? No  Currently on:   Sitagliptin 100 mg daily  Insulin glargine 18 units nightly  Metformin XR 1000mg  BID    Has been checking regularly at home. Fasting BGs in 110-120s.    Diet: Pt reports increased vegetable intake and has been conscious about carb intake. Gets proteins through lean meats (chicken, fish, eggs).    Exercise: Goes to the gym (2-3x/week). Likes to walk on the treadmill and use light weights.   Hypoglycemia: No recent events    Plan: No change. Repeating A1C today. Investigating insurance coverage for Ozempic 0.25mg  weekly injection. Once initiated, BG plan as follows:   - Decrease basal insulin to 14u nightly (20% decrease)  - Stop Sitagliptin  - Continue Metformin    Electrolytes: wnl  Lab Results   Component Value Date    Potassium 4.1 11/02/2023    Potassium, Bld 3.8 09/07/2022    Sodium 140 11/02/2023    Sodium Whole Blood 131 (L) 09/07/2022    Magnesium 1.6 11/02/2023    CO2 23 11/02/2023     Meds currently on: Magnesium plus protein 133 mg BID  Plan: No change.  Continue to monitor     GI/BM: pt reports 1-2BM. Constipation improved.  Meds currently on: docusate BID, Miralax daily, pantoprazole 40 mg daily  Plan:  No change.  Continue to monitor    Pain: pt reports mild pain with menstrual cycle that is alleviated with tylenol use.  Meds currently on: APAP PRN, Methocarbamol PRN (not using)  Plan: No change.  Continue to monitor.    Bone health:   Vitamin D Level: none available. Goal > 30.   No results found for: VITDTOT, VITDTOTAL    Lab Results   Component Value Date    Calcium 8.9 11/02/2023    Calcium 8.9 10/19/2023     Last DEXA results:   normal in 2018  Current meds include: none  Plan: Vitamin D level  within goal (33 in Oct 2024). Continue to monitor.     Women's/Men's Health:  Alicia Armstrong is a 48 y.o. Female perimenopausal. Patient reports no men's/women's health issues  Plan: Continue to monitor    Immunizations:  Influenza [Annual]: Received 04/2023    PPSV23: Received 08/2016  PCV20: Received 08/2022    Shingrix Zoster [2 doses, 2 - 6 months apart]: 1st dose given 08/2022 and 2nd dose due 10/2022    COVID-19 [3 primary doses, 2 boosters]: Received 04/2023    Pharmacy preference:  [facility]  Medication Refills:  [facility]  Medication Access:  N/a    Adherence: Patient has good understanding of medications; was able to independently identify names/doses of immunosuppressants and OI meds.  Patient  does fill their own pill box on a regular basis at home.  Patient brought medication card:no  Pill box: unable to assess via phone call  Plan: provided basic adherence counseling/intervention    During this visit, the following was completed:   BG log data assessment  BP log data assessment  Labs ordered and evaluated  complex treatment plan >1 DS   I spent a total of 15 minutes face to face with the patient delivering clinical care and providing education/counseling.    All questions/concerns were addressed to the patient's satisfaction.    Orbie Pyo, PharmD  PGY-1 Acute Care Pharmacy Resident   __________________________________________    Hazeline Junker, PharmD, CPP  Abdominal Transplant Clinical Pharmacist Practitioner

## 2023-11-09 NOTE — Unmapped (Signed)
 Pt has multiple visits in the clinic today.  Pt states she will not need an interpreter today.

## 2023-11-09 NOTE — Unmapped (Signed)
 Repeat colonoscopy ordered for completion after 02/15/24 given 09/11/22 recommendation.

## 2023-11-12 LAB — PHOSPHATIDYLETHANOL (PETH)
PETH 16:0/18:1 (POPETH) BY LC-MS/MS: <10 ng/mL
PETH 16:0/18:2 (PLPETH) BY LC-MS/MS: <10 ng/mL
PETH INTERPRETATION: NEGATIVE

## 2023-11-14 NOTE — Unmapped (Signed)
 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     Now doing very well overall.        -IS regimen reviewed with pharmacy., continue current meds  -counseled on age appropriate cancer screening and annual dermatology visits.  -LFTs reviewed and normal.  -RTC 3 months for annulas      Subjective   History of Present Illness   Accompanied by: N/A (unaccompanied)    48 y.o. 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  Did require biliary stent and had episode of ACR, but since then has been doing very well.  Normal LFTs, adherent with all meds.  No jaundice, encephalopathy, ascites or GI bleeding.    Interval history  9 month post transplant visit.  Doing very well overall.  Normal liver tests.Adherent with all meds.    Objective   Physical Exam   Vital Signs: BP 140/93 (BP Site: R Arm, BP Position: Sitting, BP Cuff Size: Large)  - Pulse 67  - Temp 35.8 ??C (96.5 ??F) (Tympanic)  - Wt 74.8 kg (165 lb)  - SpO2 99%  - BMI 38.19 kg/m??   Constitutional: She is in no apparent distress  Eyes: Anicteric sclerae  Cardiovascular: No peripheral edema  Gastrointestinal: Soft, nontender abdomen without hepatosplenomegaly, hernias, or masses  Neurologic: Awake, alert, and oriented to person, place, and time with normal speech and no asterixis      Lab Results   Component Value Date    WBC 3.9 11/09/2023    HGB 13.5 11/09/2023    HCT 39.6 11/09/2023    PLT 115 (L) 11/09/2023       Lab Results   Component Value Date    NA 140 11/09/2023    K 4.6 11/09/2023    CL 105 11/09/2023    CO2 27.0 11/09/2023    BUN 14 11/09/2023    CREATININE 0.71 11/09/2023    GLU 152 (H) 11/09/2023    CALCIUM 9.5 11/09/2023 MG 1.6 11/09/2023    PHOS 3.3 11/09/2023       Lab Results   Component Value Date    BILITOT 0.3 11/09/2023    BILIDIR <0.10 11/09/2023    PROT 6.8 11/09/2023    ALBUMIN 4.2 11/09/2023    ALT 15 11/09/2023    AST 16 11/09/2023    ALKPHOS 86 11/09/2023    GGT 20 11/09/2023       Lab Results   Component Value Date    PT 12.0 03/30/2023    INR 1.07 03/30/2023    APTT 31.8 03/30/2023

## 2023-11-14 NOTE — Unmapped (Signed)
 Patient is seen in clinic today for her nine month liver transplant follow up visit. She denied need for spanish interpreter today. She reports she is doing well and has no complaints of nausea, vomiting, diarrhea, constipation, fever, chills, diarrhea, or swelling and denies any incisional pain or issues with hernias. She denies intake of alcohol or use of tobacco or recreational drugs. Patient reports she hydrates well with at least 1.5 L of fluids daily. She mentioned that she is planning a church mission visit to [place] in May and inquired about being prescribed an antibiotic for travelers diarrhea or infection. Dr. Ruffin Frederick suggested reaching out to ID for travel planning. Spent about five minutes on post transplant, education and reinforced the importance that she drink bottled water only on this trip, adding that international trips are generally not recommended for the first year post transplant, but will plan to get recommendations from ID. Reviewed recommendation for yearly follow up with Dermatology and reconnecting with PCP at one year anniversary. Patient reports she needs to find a replacement for her previous primary care. She is due for repeat colonoscopy in July or August due to poor prep at last visit so sent note to [facility] to assist with getting this scheduled for her. Patient to continue labs every two weeks with return visit for annual in three months. She verbalized understanding of all discussed and had pharmacy appointment as well at this visit.

## 2023-11-15 DIAGNOSIS — Z5181 Encounter for therapeutic drug level monitoring: Principal | ICD-10-CM

## 2023-11-15 DIAGNOSIS — E612 Magnesium deficiency: Principal | ICD-10-CM

## 2023-11-15 DIAGNOSIS — Z796 Long-term use of immunosuppressant medication: Principal | ICD-10-CM

## 2023-11-15 DIAGNOSIS — Z944 Liver transplant status: Principal | ICD-10-CM

## 2023-11-15 MED ORDER — AZITHROMYCIN 500 MG TABLET
ORAL_TABLET | Freq: Every day | ORAL | 0 refills | 3 days
Start: 2023-11-15 — End: 2023-11-18

## 2023-11-18 MED ORDER — AZITHROMYCIN 500 MG TABLET
ORAL_TABLET | Freq: Every day | ORAL | 0 refills | 3 days | Status: CP
Start: 2023-11-18 — End: 2023-11-18

## 2023-11-18 MED ORDER — TYPHIM VI 25 MCG/0.5 ML INTRAMUSCULAR SOLUTION
Freq: Once | INTRAMUSCULAR | 0 refills | 0 days | Status: CP
Start: 2023-11-18 — End: 2023-11-18

## 2023-11-18 NOTE — Unmapped (Signed)
 Patient notified Dr.Barritt and [facility] during her 9 mos visit that she planned on doing a church mission trip to [place] in May and requested a prophylactic abx for protection. Per Dr.Barritt's request, reached out to Dr.Lachiewicz who recommended azithromycin 500mg  daily x 3 days for traveler's diarrhea and  that she have Typhim Vi IM 0.2mL IM every 2 years, explaining that it is not usually covered by insurance but an rx could be sent to her pharmacy to price it. She also added that  depending on location of travel she may need malaria prophylaxis: Recommended for visitors to the departments of [address], [place], [place], [place], and [place] [no transmission in the cities of [place] or [place] or [place].    Spoke with patient, reinforcing that it is not recommended that txp patients travel internationally during their first yr of txp, but if she chose to do so,then she should assure she always drinks bottled water and relayed all of Dr.Lachieiwicz recommendations. Patient verbalized understanding and said she was not staying in any of the departments mentioned by Dr.Lachiewicz. Scripts sent to patient's preferred pharmacy.

## 2023-11-22 DIAGNOSIS — Z5181 Encounter for therapeutic drug level monitoring: Principal | ICD-10-CM

## 2023-11-22 DIAGNOSIS — Z944 Liver transplant status: Principal | ICD-10-CM

## 2023-11-22 DIAGNOSIS — E612 Magnesium deficiency: Principal | ICD-10-CM

## 2023-11-22 DIAGNOSIS — Z796 Long-term use of immunosuppressant medication: Principal | ICD-10-CM

## 2023-11-24 LAB — CBC W/ DIFFERENTIAL
BANDED NEUTROPHILS ABSOLUTE COUNT: 0 10*3/uL (ref 0.0–0.1)
BASOPHILS ABSOLUTE COUNT: 0 10*3/uL (ref 0.0–0.2)
BASOPHILS RELATIVE PERCENT: 0 %
EOSINOPHILS ABSOLUTE COUNT: 0.1 10*3/uL (ref 0.0–0.4)
EOSINOPHILS RELATIVE PERCENT: 3 %
HEMATOCRIT: 41.6 % (ref 34.0–46.6)
HEMOGLOBIN: 13.3 g/dL (ref 11.1–15.9)
IMMATURE GRANULOCYTES: 0 %
LYMPHOCYTES ABSOLUTE COUNT: 0.9 10*3/uL (ref 0.7–3.1)
LYMPHOCYTES RELATIVE PERCENT: 26 %
MEAN CORPUSCULAR HEMOGLOBIN CONC: 32 g/dL (ref 31.5–35.7)
MEAN CORPUSCULAR HEMOGLOBIN: 26.7 pg (ref 26.6–33.0)
MEAN CORPUSCULAR VOLUME: 83 fL (ref 79–97)
MONOCYTES ABSOLUTE COUNT: 0.3 10*3/uL (ref 0.1–0.9)
MONOCYTES RELATIVE PERCENT: 8 %
NEUTROPHILS ABSOLUTE COUNT: 2.2 10*3/uL (ref 1.4–7.0)
NEUTROPHILS RELATIVE PERCENT: 63 %
PLATELET COUNT: 101 10*3/uL — ABNORMAL LOW (ref 150–450)
RED BLOOD CELL COUNT: 4.99 x10E6/uL (ref 3.77–5.28)
RED CELL DISTRIBUTION WIDTH: 14.8 % (ref 11.7–15.4)
WHITE BLOOD CELL COUNT: 3.5 10*3/uL (ref 3.4–10.8)

## 2023-11-24 LAB — COMPREHENSIVE METABOLIC PANEL
ALBUMIN: 4.2 g/dL (ref 3.9–4.9)
ALKALINE PHOSPHATASE: 113 IU/L (ref 44–121)
ALT (SGPT): 19 IU/L (ref 0–32)
AST (SGOT): 16 IU/L (ref 0–40)
BILIRUBIN TOTAL (MG/DL) IN SER/PLAS: 0.3 mg/dL (ref 0.0–1.2)
BLOOD UREA NITROGEN: 14 mg/dL (ref 6–24)
BUN / CREAT RATIO: 17 (ref 9–23)
CALCIUM: 8.8 mg/dL (ref 8.7–10.2)
CHLORIDE: 102 mmol/L (ref 96–106)
CO2: 20 mmol/L (ref 20–29)
CREATININE: 0.82 mg/dL (ref 0.57–1.00)
EGFR: 89 mL/min/{1.73_m2}
GLOBULIN, TOTAL: 2 g/dL (ref 1.5–4.5)
GLUCOSE: 217 mg/dL — ABNORMAL HIGH (ref 70–99)
POTASSIUM: 4.3 mmol/L (ref 3.5–5.2)
SODIUM: 138 mmol/L (ref 134–144)
TOTAL PROTEIN: 6.2 g/dL (ref 6.0–8.5)

## 2023-11-24 LAB — GAMMA GT: GAMMA GLUTAMYL TRANSFERASE: 18 IU/L (ref 0–60)

## 2023-11-24 LAB — PHOSPHORUS: PHOSPHORUS, SERUM: 2.9 mg/dL — ABNORMAL LOW (ref 3.0–4.3)

## 2023-11-24 LAB — BILIRUBIN, DIRECT: BILIRUBIN DIRECT: 0.09 mg/dL (ref 0.00–0.40)

## 2023-11-24 LAB — MAGNESIUM: MAGNESIUM: 1.7 mg/dL (ref 1.6–2.3)

## 2023-11-25 LAB — TACROLIMUS LEVEL: TACROLIMUS BLOOD: 7.5 ng/mL (ref 5.0–20.0)

## 2023-11-26 DIAGNOSIS — T8641 Liver transplant rejection: Principal | ICD-10-CM

## 2023-11-26 DIAGNOSIS — Z944 Liver transplant status: Principal | ICD-10-CM

## 2023-11-26 MED ORDER — PREDNISONE 5 MG TABLET
ORAL_TABLET | Freq: Every day | ORAL | 5 refills | 30 days | Status: CP
Start: 2023-11-26 — End: ?
  Filled 2023-11-29: qty 30, 30d supply, fill #0

## 2023-11-26 NOTE — Unmapped (Signed)
 Pt request for RX Refill

## 2023-11-26 NOTE — Unmapped (Signed)
 [facility] Special[facility] and [facility] Delivery Pharmacy Refill Coordination Note    Specialty Medication(s) to be Shipped:   Transplant: mycophenolate mofetil 250mg , tacrolimus 1mg , and Prednisone 5mg     Other medication(s) to be shipped:  Test strips, Lancets, aspirin, carvedilol, Colace, Januvia, metformin, pantoprazole, magnesium, Lantus, pen needles     Alicia Renelda Mom, DOB: 1975-09-24  Phone: [phone] (home) [phone] (work)      All above HIPAA information was verified with patient.     Was a Nurse, learning disability used for this call? No    Completed refill call assessment today to schedule patient's medication shipment from the [facility] Delivery Pharm[facility]  [phone]).  All relevant notes have been reviewed.     Specialty medication(s) and dose(s) confirmed: Regimen is correct and unchanged.   Changes to medications: Jeremie reports no changes at this time.  Changes to insurance: No  New side effects reported not previously addressed with a pharmacist or physician: None reported  Questions for the pharmacist: No    Confirmed patient received a Conservation officer, historic buildings and a Surveyor, mining with first shipment. The patient will receive a drug information handout for each medication shipped and additional FDA Medication Guides as required.       DISEASE/MEDICATION-SPECIFIC INFORMATION        N/A    SPECIALTY MEDICATION ADHERENCE     Medication Adherence    Patient reported X missed doses in the last month: 0  Specialty Medication: tacrolimus 1 MG capsule (PROGRAF)  Patient is on additional specialty medications: Yes  Additional Specialty Medications: mycophenolate 250 mg capsule (CELLCEPT)  Patient Reported Additional Medication X Missed Doses in the Last Month: 0  Patient is on more than two specialty medications: Yes  Specialty Medication: predniSONE 5 MG tablet (DELTASONE)  Patient Reported Additional Medication X Missed Doses in the Last Month: 0  Any gaps in refill history greater than 2 weeks in the last 3 months: no  Demonstrates understanding of importance of adherence: yes  Informant: patient  Confirmed plan for next specialty medication refill: delivery by pharmacy  Refills needed for supportive medications: not needed          Refill Coordination    Has the Patients' Contact Information Changed: No  Is the Shipping Address Different: No         Were doses missed due to medication being on hold? No    predniSONE 5 mg: 3 days of medicine on hand   tacrolimus 1  mg: 3 days of medicine on hand   mycophenolate 250 mg: 3 days of medicine on hand       REFERRAL TO PHARMACIST     Referral to the pharmacist: Not needed      [facility]     Shipping address confirmed in Epic.     Cost and Payment: Patient has a copay of $100.60. They are aware and have authorized the pharmacy to charge the credit card on file.    Delivery Scheduled: Yes, Expected medication delivery date: 11/30/23.  However, Rx request for refills was sent to the provider as there are none remaining.     Medication will be delivered via UPS to the prescription address in Epic [place].    Kerby Less   [facility] and [facility] Delivery Pharm[facility]  Specialty Technician

## 2023-11-26 NOTE — Unmapped (Signed)
 TRANSPLANT PHARMACIST PHONE CALL    Reason for call: Ozempic appeal approved    Plan:  Pt will start Ozempic 0.25 mg weekly x4 weeks then increase to 0.5 mg weekly if tolerated.    Stop sitagliptin with Ozempic initiation.    Medication Access/Assistance:  Copay card will be applied    Hazeline Junker, PharmD, CPP  Abdominal Transplant Clinical Pharmacist Practitioner

## 2023-11-29 DIAGNOSIS — E612 Magnesium deficiency: Principal | ICD-10-CM

## 2023-11-29 DIAGNOSIS — Z944 Liver transplant status: Principal | ICD-10-CM

## 2023-11-29 DIAGNOSIS — Z5181 Encounter for therapeutic drug level monitoring: Principal | ICD-10-CM

## 2023-11-29 DIAGNOSIS — Z796 Long-term use of immunosuppressant medication: Principal | ICD-10-CM

## 2023-11-29 MED FILL — PANTOPRAZOLE 40 MG TABLET,DELAYED RELEASE: ORAL | 30 days supply | Qty: 30 | Fill #5

## 2023-11-29 MED FILL — ACCU-CHEK GUIDE TEST STRIPS: 25 days supply | Qty: 100 | Fill #3

## 2023-11-29 MED FILL — MYCOPHENOLATE MOFETIL 250 MG CAPSULE: ORAL | 90 days supply | Qty: 180 | Fill #1

## 2023-11-29 MED FILL — METFORMIN ER 500 MG TABLET,EXTENDED RELEASE 24 HR: ORAL | 30 days supply | Qty: 120 | Fill #2

## 2023-11-29 MED FILL — ASPIRIN 81 MG TABLET,DELAYED RELEASE: ORAL | 30 days supply | Qty: 30 | Fill #4

## 2023-11-29 MED FILL — LANTUS SOLOSTAR U-100 INSULIN 100 UNIT/ML (3 ML) SUBCUTANEOUS PEN: SUBCUTANEOUS | 83 days supply | Qty: 15 | Fill #1

## 2023-11-29 MED FILL — MG-PLUS-PROTEIN 133 MG TABLET: ORAL | 50 days supply | Qty: 100 | Fill #4

## 2023-11-29 MED FILL — TACROLIMUS 1 MG CAPSULE, IMMEDIATE-RELEASE: ORAL | 30 days supply | Qty: 150 | Fill #1

## 2023-11-29 MED FILL — CARVEDILOL 6.25 MG TABLET: ORAL | 30 days supply | Qty: 60 | Fill #7

## 2023-11-29 MED FILL — ACCU-CHEK SOFTCLIX LANCETS: 30 days supply | Qty: 100 | Fill #3

## 2023-11-29 MED FILL — TRUEPLUS PEN NEEDLE 32 GAUGE X 5/32" (4 MM): 25 days supply | Qty: 100 | Fill #2

## 2023-11-29 MED FILL — DOCUSATE SODIUM 100 MG CAPSULE: ORAL | 50 days supply | Qty: 100 | Fill #2

## 2023-11-30 NOTE — Unmapped (Signed)
 [facility] Specialty and Home Delivery Pharmacy Refill Coordination Note    Specialty Lite Medication(s) to be Shipped:   Ozempic    Other medication(s) to be shipped: No additional medications requested for fill at this time     Pallavi Clifton, DOB: 11/14/1975  Phone: [phone] (home) [phone] (work)      All above HIPAA information was verified with patient.     Was a Nurse, learning disability used for this call? No    Changes to medications: Avaleigh reports no changes at this time.  Changes to insurance: No      REFERRAL TO PHARMACIST     Referral to the pharmacist: Not needed      [facility]     Shipping address confirmed in Epic.     Cost and Payment: Patient has a copay of $93.60. They are aware and have authorized the pharmacy to charge the credit card on file.    Delivery Scheduled: Yes, Expected medication delivery date: 4/11.     Medication will be delivered via UPS to the prescription address in Epic WAM.    [facility]

## 2023-12-02 MED FILL — AMLODIPINE 5 MG TABLET: ORAL | 30 days supply | Qty: 30 | Fill #7

## 2023-12-06 DIAGNOSIS — Z944 Liver transplant status: Principal | ICD-10-CM

## 2023-12-06 DIAGNOSIS — E612 Magnesium deficiency: Principal | ICD-10-CM

## 2023-12-06 DIAGNOSIS — Z796 Long-term use of immunosuppressant medication: Principal | ICD-10-CM

## 2023-12-06 DIAGNOSIS — Z5181 Encounter for therapeutic drug level monitoring: Principal | ICD-10-CM

## 2023-12-08 LAB — PHOSPHORUS: PHOSPHORUS, SERUM: 2.3 mg/dL — ABNORMAL LOW (ref 3.0–4.3)

## 2023-12-08 LAB — COMPREHENSIVE METABOLIC PANEL
ALBUMIN: 4.3 g/dL (ref 3.9–4.9)
ALKALINE PHOSPHATASE: 100 IU/L (ref 44–121)
ALT (SGPT): 19 IU/L (ref 0–32)
AST (SGOT): 17 IU/L (ref 0–40)
BILIRUBIN TOTAL (MG/DL) IN SER/PLAS: 0.4 mg/dL (ref 0.0–1.2)
BLOOD UREA NITROGEN: 11 mg/dL (ref 6–24)
BUN / CREAT RATIO: 15 (ref 9–23)
CALCIUM: 8.7 mg/dL (ref 8.7–10.2)
CHLORIDE: 102 mmol/L (ref 96–106)
CO2: 22 mmol/L (ref 20–29)
CREATININE: 0.72 mg/dL (ref 0.57–1.00)
EGFR: 104 mL/min/{1.73_m2}
GLOBULIN, TOTAL: 2 g/dL (ref 1.5–4.5)
GLUCOSE: 206 mg/dL — ABNORMAL HIGH (ref 70–99)
POTASSIUM: 4.1 mmol/L (ref 3.5–5.2)
SODIUM: 137 mmol/L (ref 134–144)
TOTAL PROTEIN: 6.3 g/dL (ref 6.0–8.5)

## 2023-12-08 LAB — CBC W/ DIFFERENTIAL
BANDED NEUTROPHILS ABSOLUTE COUNT: 0 10*3/uL (ref 0.0–0.1)
BASOPHILS ABSOLUTE COUNT: 0 10*3/uL (ref 0.0–0.2)
BASOPHILS RELATIVE PERCENT: 0 %
EOSINOPHILS ABSOLUTE COUNT: 0.1 10*3/uL (ref 0.0–0.4)
EOSINOPHILS RELATIVE PERCENT: 2 %
HEMATOCRIT: 41.9 % (ref 34.0–46.6)
HEMOGLOBIN: 13.4 g/dL (ref 11.1–15.9)
IMMATURE GRANULOCYTES: 0 %
LYMPHOCYTES ABSOLUTE COUNT: 1 10*3/uL (ref 0.7–3.1)
LYMPHOCYTES RELATIVE PERCENT: 34 %
MEAN CORPUSCULAR HEMOGLOBIN CONC: 32 g/dL (ref 31.5–35.7)
MEAN CORPUSCULAR HEMOGLOBIN: 26.9 pg (ref 26.6–33.0)
MEAN CORPUSCULAR VOLUME: 84 fL (ref 79–97)
MONOCYTES ABSOLUTE COUNT: 0.3 10*3/uL (ref 0.1–0.9)
MONOCYTES RELATIVE PERCENT: 11 %
NEUTROPHILS ABSOLUTE COUNT: 1.6 10*3/uL (ref 1.4–7.0)
NEUTROPHILS RELATIVE PERCENT: 53 %
PLATELET COUNT: 101 10*3/uL — ABNORMAL LOW (ref 150–450)
RED BLOOD CELL COUNT: 4.99 x10E6/uL (ref 3.77–5.28)
RED CELL DISTRIBUTION WIDTH: 15.3 % (ref 11.7–15.4)
WHITE BLOOD CELL COUNT: 3.1 10*3/uL — ABNORMAL LOW (ref 3.4–10.8)

## 2023-12-08 LAB — GAMMA GT: GAMMA GLUTAMYL TRANSFERASE: 16 IU/L (ref 0–60)

## 2023-12-08 LAB — BILIRUBIN, DIRECT: BILIRUBIN DIRECT: 0.13 mg/dL (ref 0.00–0.40)

## 2023-12-08 LAB — MAGNESIUM: MAGNESIUM: 1.9 mg/dL (ref 1.6–2.3)

## 2023-12-09 LAB — TACROLIMUS LEVEL: TACROLIMUS BLOOD: 6.9 ng/mL (ref 5.0–20.0)

## 2023-12-13 DIAGNOSIS — Z944 Liver transplant status: Principal | ICD-10-CM

## 2023-12-13 DIAGNOSIS — Z796 Long-term use of immunosuppressant medication: Principal | ICD-10-CM

## 2023-12-13 DIAGNOSIS — Z5181 Encounter for therapeutic drug level monitoring: Principal | ICD-10-CM

## 2023-12-13 DIAGNOSIS — E612 Magnesium deficiency: Principal | ICD-10-CM

## 2023-12-20 DIAGNOSIS — Z944 Liver transplant status: Principal | ICD-10-CM

## 2023-12-20 DIAGNOSIS — E612 Magnesium deficiency: Principal | ICD-10-CM

## 2023-12-20 DIAGNOSIS — Z796 Long-term use of immunosuppressant medication: Principal | ICD-10-CM

## 2023-12-20 DIAGNOSIS — Z5181 Encounter for therapeutic drug level monitoring: Principal | ICD-10-CM

## 2023-12-22 LAB — COMPREHENSIVE METABOLIC PANEL
ALBUMIN: 4.6 g/dL (ref 3.9–4.9)
ALKALINE PHOSPHATASE: 108 IU/L (ref 44–121)
ALT (SGPT): 15 IU/L (ref 0–32)
AST (SGOT): 10 IU/L (ref 0–40)
BILIRUBIN TOTAL (MG/DL) IN SER/PLAS: 0.4 mg/dL (ref 0.0–1.2)
BLOOD UREA NITROGEN: 13 mg/dL (ref 6–24)
BUN / CREAT RATIO: 18 (ref 9–23)
CALCIUM: 8.8 mg/dL (ref 8.7–10.2)
CHLORIDE: 98 mmol/L (ref 96–106)
CO2: 24 mmol/L (ref 20–29)
CREATININE: 0.71 mg/dL (ref 0.57–1.00)
EGFR: 105 mL/min/{1.73_m2}
GLOBULIN, TOTAL: 2 g/dL (ref 1.5–4.5)
GLUCOSE: 265 mg/dL — ABNORMAL HIGH (ref 70–99)
POTASSIUM: 4.4 mmol/L (ref 3.5–5.2)
SODIUM: 138 mmol/L (ref 134–144)
TOTAL PROTEIN: 6.6 g/dL (ref 6.0–8.5)

## 2023-12-22 LAB — CBC W/ DIFFERENTIAL
BANDED NEUTROPHILS ABSOLUTE COUNT: 0 10*3/uL (ref 0.0–0.1)
BASOPHILS ABSOLUTE COUNT: 0 10*3/uL (ref 0.0–0.2)
BASOPHILS RELATIVE PERCENT: 0 %
EOSINOPHILS ABSOLUTE COUNT: 0.1 10*3/uL (ref 0.0–0.4)
EOSINOPHILS RELATIVE PERCENT: 3 %
HEMATOCRIT: 43.4 % (ref 34.0–46.6)
HEMOGLOBIN: 14.3 g/dL (ref 11.1–15.9)
IMMATURE GRANULOCYTES: 0 %
LYMPHOCYTES ABSOLUTE COUNT: 1 10*3/uL (ref 0.7–3.1)
LYMPHOCYTES RELATIVE PERCENT: 33 %
MEAN CORPUSCULAR HEMOGLOBIN CONC: 32.9 g/dL (ref 31.5–35.7)
MEAN CORPUSCULAR HEMOGLOBIN: 27.3 pg (ref 26.6–33.0)
MEAN CORPUSCULAR VOLUME: 83 fL (ref 79–97)
MONOCYTES ABSOLUTE COUNT: 0.3 10*3/uL (ref 0.1–0.9)
MONOCYTES RELATIVE PERCENT: 10 %
NEUTROPHILS ABSOLUTE COUNT: 1.7 10*3/uL (ref 1.4–7.0)
NEUTROPHILS RELATIVE PERCENT: 54 %
PLATELET COUNT: 121 10*3/uL — ABNORMAL LOW (ref 150–450)
RED BLOOD CELL COUNT: 5.23 x10E6/uL (ref 3.77–5.28)
RED CELL DISTRIBUTION WIDTH: 14.5 % (ref 11.7–15.4)
WHITE BLOOD CELL COUNT: 3.1 10*3/uL — ABNORMAL LOW (ref 3.4–10.8)

## 2023-12-22 LAB — BILIRUBIN, DIRECT: BILIRUBIN DIRECT: 0.15 mg/dL (ref 0.00–0.40)

## 2023-12-22 LAB — GAMMA GT: GAMMA GLUTAMYL TRANSFERASE: 15 IU/L (ref 0–60)

## 2023-12-22 LAB — PHOSPHORUS: PHOSPHORUS, SERUM: 2.7 mg/dL — ABNORMAL LOW (ref 3.0–4.3)

## 2023-12-22 LAB — MAGNESIUM: MAGNESIUM: 1.9 mg/dL (ref 1.6–2.3)

## 2023-12-22 NOTE — Unmapped (Signed)
 [facility] Specialty and Home Delivery Pharmacy Refill Coordination Note    Specialty Medication(s) to be Shipped:   Transplant: tacrolimus 1mg     Other medication(s) to be shipped:  amlodipine 5 MG tablet (NORVASC), aspirin 81 MG tablet (ECOTRIN), blood sugar diagnostic: ACCU-CHEK GUIDE TEST STRIPS Strp, carvedilol 6.25 MG tablet (COREG),docusate sodium 100 MG capsule (COLACE), lancets: ACCU-CHEK SOFTCLIX LANCETS lancets,  magnesium oxide-Mg AA chelate: magnesium (amino acid chelate) 133 mg tablet, metFORMIN 500 MG 24 hr tablet (GLUCOPHAGE-XR), pantoprazole 40 MG tablet (Protonix), pen needle, diabetic: TRUEPLUS PEN NEEDLE 32 gauge x 5/32 (4 mm) Ndle, predniSONE 5 MG tablet (DELTASONE)     Alicia Armstrong, DOB: 1975-12-06  Phone: [phone] (home) [phone] (work)      All above HIPAA information was verified with patient.     Was a Nurse, learning disability used for this call? No    Completed refill call assessment today to schedule patient's medication shipment from the [facility] and Home Delivery Pharmacy  [phone]).  All relevant notes have been reviewed.     Specialty medication(s) and dose(s) confirmed: Regimen is correct and unchanged.   Changes to medications: Dyamon reports no changes at this time.  Changes to insurance: No  New side effects reported not previously addressed with a pharmacist or physician: None reported  Questions for the pharmacist: No    Confirmed patient received a Conservation officer, historic buildings and a Surveyor, mining with first shipment. The patient will receive a drug information handout for each medication shipped and additional FDA Medication Guides as required.       DISEASE/MEDICATION-SPECIFIC INFORMATION        N/A    SPECIALTY MEDICATION ADHERENCE     Medication Adherence    Patient reported X missed doses in the last month: 0  Specialty Medication: tacrolimus 1 MG capsule (PROGRAF)  Patient is on additional specialty medications: No              Were doses missed due to medication being on hold? No     tacrolimus 1 MG capsule (PROGRAF): 9 days of medicine on hand       REFERRAL TO PHARMACIST     Referral to the pharmacist: Not needed      [facility]     Shipping address confirmed in Epic.     Cost and Payment: Patient has a copay of $40.60. They are aware and have authorized the pharmacy to charge the credit card on file.    Delivery Scheduled: Yes, Expected medication delivery date: 12/30/2023.     Medication will be delivered via UPS to the prescription address in Epic WAM.    Lanny Plan   [facility] and [facility] Delivery Pharm[facility]  Specialty Technician

## 2023-12-23 LAB — TACROLIMUS LEVEL: TACROLIMUS BLOOD: 7.6 ng/mL (ref 5.0–20.0)

## 2023-12-27 DIAGNOSIS — E612 Magnesium deficiency: Principal | ICD-10-CM

## 2023-12-27 DIAGNOSIS — Z796 Long-term use of immunosuppressant medication: Principal | ICD-10-CM

## 2023-12-27 DIAGNOSIS — Z944 Liver transplant status: Principal | ICD-10-CM

## 2023-12-27 DIAGNOSIS — Z5181 Encounter for therapeutic drug level monitoring: Principal | ICD-10-CM

## 2023-12-27 NOTE — Unmapped (Signed)
 Patient contacted [facility] today to inquire about the vaccines needed for her trip to [place], planned for 5/12-5/20. Explained that the typhoid vaccine prescription was sent to her pharmacy on 3/27 and would take about 10-14 days to be effective. Let her know that she might be able to obtain it from her local health dept. Also sent her the recommendations via MyChart, for areas where she would need antimalarial prophy as well. She verbalized understanding.

## 2023-12-29 MED FILL — TRUEPLUS PEN NEEDLE 32 GAUGE X 5/32" (4 MM): ORAL | 25 days supply | Qty: 100 | Fill #3

## 2023-12-29 MED FILL — METFORMIN ER 500 MG TABLET,EXTENDED RELEASE 24 HR: ORAL | 30 days supply | Qty: 120 | Fill #3

## 2023-12-29 MED FILL — TACROLIMUS 1 MG CAPSULE, IMMEDIATE-RELEASE: ORAL | 30 days supply | Qty: 150 | Fill #2

## 2023-12-29 MED FILL — ACCU-CHEK SOFTCLIX LANCETS: 30 days supply | Qty: 100 | Fill #4

## 2023-12-29 MED FILL — DOCUSATE SODIUM 100 MG CAPSULE: ORAL | 50 days supply | Qty: 100 | Fill #3

## 2023-12-29 MED FILL — AMLODIPINE 5 MG TABLET: ORAL | 30 days supply | Qty: 30 | Fill #8

## 2023-12-29 MED FILL — ACCU-CHEK GUIDE TEST STRIPS: 25 days supply | Qty: 100 | Fill #4

## 2023-12-29 MED FILL — ASPIRIN 81 MG TABLET,DELAYED RELEASE: ORAL | 30 days supply | Qty: 30 | Fill #5

## 2023-12-29 MED FILL — PANTOPRAZOLE 40 MG TABLET,DELAYED RELEASE: ORAL | 30 days supply | Qty: 30 | Fill #6

## 2023-12-29 MED FILL — MG-PLUS-PROTEIN 133 MG TABLET: ORAL | 50 days supply | Qty: 100 | Fill #5

## 2023-12-29 MED FILL — CARVEDILOL 6.25 MG TABLET: ORAL | 30 days supply | Qty: 60 | Fill #8

## 2023-12-29 MED FILL — PREDNISONE 5 MG TABLET: ORAL | 30 days supply | Qty: 30 | Fill #1

## 2023-12-31 ENCOUNTER — Encounter: Admit: 2023-12-31 | Discharge: 2024-01-01 | Payer: PRIVATE HEALTH INSURANCE

## 2023-12-31 MED ORDER — OZEMPIC 0.25 MG OR 0.5 MG (2 MG/3 ML) SUBCUTANEOUS PEN INJECTOR
SUBCUTANEOUS | 11 refills | 28.00000 days | Status: CP
Start: 2023-12-31 — End: ?
  Filled 2024-01-13: qty 3, 28d supply, fill #0

## 2023-12-31 NOTE — Unmapped (Signed)
 [facility]   PHARMACY DIABETES FOLLOW UP  12/31/2023  Alicia Armstrong  [phone]      Alicia Armstrong is a 48 y.o. 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     Other PMH significant for PTDM    Seen by pharmacy today for: blood glucose management     DM treatment::  -Started metformin in December 2024  -Started Ozempic in mid April, stopped sitagliptin and Humalog    Current Outpatient Medications   Medication Instructions    acetaminophen (TYLENOL) 325-650 mg, Oral, Every 8 hours PRN    amlodipine (NORVASC) 5 mg, Oral, Daily (standard)    aspirin (ECOTRIN) 81 mg, Oral, Daily (standard)    atorvastatin (LIPITOR) 10 mg, Oral, Daily (standard)    biotin 5,000 mcg, Oral, Daily (standard)    blood sugar diagnostic (ACCU-CHEK GUIDE TEST STRIPS) Strp Other, 4 times a day    blood-glucose meter kit Use as instructed    carvedilol (COREG) 6.25 mg, Oral, 2 times a day (standard)    docusate sodium (COLACE) 100 mg, Oral, 2 times a day PRN    lancets Misc Use to check blood sugar as directed with insulin 3 times a day & for symptoms of high or low blood sugar.    LANTUS SOLOSTAR U-100 INSULIN 18 Units, Subcutaneous, Nightly    magnesium oxide-Mg AA chelate (MAGNESIUM, AMINO ACID CHELATE,) 133 mg 1 tablet, Oral, 2 times a day (standard)    metFORMIN (GLUCOPHAGE-XR) 1,000 mg, Oral, 2 times a day    methocarbamol (ROBAXIN) 500 mg, Oral, 4 times a day PRN    mycophenolate (CELLCEPT) 250 mg, Oral, 2 times a day (standard)    pantoprazole (PROTONIX) 40 mg, Oral, Daily (standard)    pen needle, diabetic 32 gauge x 5/32 (4 mm) Ndle Use with insulin up to 4 times/day as needed.    polyethylene glycol (MIRALAX) 17 gram packet Mix 1 packet in  4 to 8 ounces of liquid and drink daily as needed.    predniSONE (DELTASONE) 5 mg, Oral, Daily (standard)    semaglutide (OZEMPIC) 0.25 mg or 0.5 mg (2 mg/3 mL) PnIj Inject 0.25 mg under the skin weekly for 4 weeks then increase to 0.5 mg weekly    SENNA 8.6 mg tablet 1 tablet, Oral, Daily PRN    tacrolimus (PROGRAF) 1 MG capsule Take 3 capsules (3 mg total) by mouth daily AND 2 capsules (2 mg total) nightly.       Diabetes:  Type: PTDM  A1C:   Lab Results   Component Value Date    A1C 6.4 (H) 11/09/2023    Goal A1c < 7  BMI: There is no height or weight on file to calculate BMI.    On a statin: No   The 10-year ASCVD risk score (Arnett DK, et al., 2019) is: 2.7%  On appropriate strength statin: No     Current diabetes medications: Lantus 18 units HS, Ozempic 0.5 mg weekly (this week is first week of 0.5 mg dose), metformin XR 1g BID    Hypoglycemia: No   Home blood glucose log:   FBG not checking as eats breakfast immediately upon waking (BG on labs not fasting)  HS:  120-130    Plan:    Medication changes today:   1. None - continue Ozempic 0.5 mg weekly until June visit    Follow up items:  Statin at future visit  A1c at June visit  Next visit with pharmacy in 4 weeks    During this visit, the following was completed:   BG log data assessment  Labs ordered and evaluated  simple treatment plan - 1 DS     All questions/concerns were addressed to the patient's satisfaction.  __________________________________________  Argyle Began PHARMD, CPP  [employer] TRANSPLANT CLINICAL PHARMACIST PRACTITIONER    .    The patient reports they are physically located in [place]  and is currently: at home. I conducted a audio/video visit. I spent  70m 03s on the video call with the patient. I spent an additional 5 minutes on pre- and post-visit activities on the date of service .

## 2024-01-03 DIAGNOSIS — Z944 Liver transplant status: Principal | ICD-10-CM

## 2024-01-03 DIAGNOSIS — E612 Magnesium deficiency: Principal | ICD-10-CM

## 2024-01-03 DIAGNOSIS — Z5181 Encounter for therapeutic drug level monitoring: Principal | ICD-10-CM

## 2024-01-03 DIAGNOSIS — Z796 Long-term use of immunosuppressant medication: Principal | ICD-10-CM

## 2024-01-04 NOTE — Unmapped (Signed)
 Called pt to see if she needed phone number to GI Procedures since colonoscopy needed this summer still not scheduled. Pt said she was out of the [place]  and would be back on 5/20 and would prefer to discuss it then. Told her this tpa would call her back after she returned, and she verbalized understanding of all discussed.    Entered note in outlook to call pt on 5/22.

## 2024-01-04 NOTE — Unmapped (Signed)
 I was the supervising physician in the delivery of the service. Vickii Chafe, MD

## 2024-01-10 DIAGNOSIS — Z944 Liver transplant status: Principal | ICD-10-CM

## 2024-01-10 DIAGNOSIS — Z796 Long-term use of immunosuppressant medication: Principal | ICD-10-CM

## 2024-01-10 DIAGNOSIS — Z5181 Encounter for therapeutic drug level monitoring: Principal | ICD-10-CM

## 2024-01-10 DIAGNOSIS — E612 Magnesium deficiency: Principal | ICD-10-CM

## 2024-01-13 NOTE — Unmapped (Signed)
 Contacted patient, who confirmed she had returned from her trip to [place]. She reports everything went well, and that she repeated labs today at [facility].

## 2024-01-14 LAB — CBC W/ DIFFERENTIAL
BANDED NEUTROPHILS ABSOLUTE COUNT: 0 10*3/uL (ref 0.0–0.1)
BASOPHILS ABSOLUTE COUNT: 0 10*3/uL (ref 0.0–0.2)
BASOPHILS RELATIVE PERCENT: 0 %
EOSINOPHILS ABSOLUTE COUNT: 0.1 10*3/uL (ref 0.0–0.4)
EOSINOPHILS RELATIVE PERCENT: 3 %
HEMATOCRIT: 40.9 % (ref 34.0–46.6)
HEMOGLOBIN: 13.4 g/dL (ref 11.1–15.9)
IMMATURE GRANULOCYTES: 0 %
LYMPHOCYTES ABSOLUTE COUNT: 0.9 10*3/uL (ref 0.7–3.1)
LYMPHOCYTES RELATIVE PERCENT: 30 %
MEAN CORPUSCULAR HEMOGLOBIN CONC: 32.8 g/dL (ref 31.5–35.7)
MEAN CORPUSCULAR HEMOGLOBIN: 27.7 pg (ref 26.6–33.0)
MEAN CORPUSCULAR VOLUME: 85 fL (ref 79–97)
MONOCYTES ABSOLUTE COUNT: 0.3 10*3/uL (ref 0.1–0.9)
MONOCYTES RELATIVE PERCENT: 8 %
NEUTROPHILS ABSOLUTE COUNT: 1.9 10*3/uL (ref 1.4–7.0)
NEUTROPHILS RELATIVE PERCENT: 59 %
PLATELET COUNT: 126 10*3/uL — ABNORMAL LOW (ref 150–450)
RED BLOOD CELL COUNT: 4.84 x10E6/uL (ref 3.77–5.28)
RED CELL DISTRIBUTION WIDTH: 14.8 % (ref 11.7–15.4)
WHITE BLOOD CELL COUNT: 3.2 10*3/uL — ABNORMAL LOW (ref 3.4–10.8)

## 2024-01-14 LAB — BILIRUBIN, DIRECT: BILIRUBIN DIRECT: 0.12 mg/dL (ref 0.00–0.40)

## 2024-01-14 LAB — COMPREHENSIVE METABOLIC PANEL
ALBUMIN: 4.4 g/dL (ref 3.9–4.9)
ALKALINE PHOSPHATASE: 90 IU/L (ref 44–121)
ALT (SGPT): 16 IU/L (ref 0–32)
AST (SGOT): 13 IU/L (ref 0–40)
BILIRUBIN TOTAL (MG/DL) IN SER/PLAS: 0.3 mg/dL (ref 0.0–1.2)
BLOOD UREA NITROGEN: 13 mg/dL (ref 6–24)
BUN / CREAT RATIO: 19 (ref 9–23)
CALCIUM: 8.8 mg/dL (ref 8.7–10.2)
CHLORIDE: 103 mmol/L (ref 96–106)
CO2: 20 mmol/L (ref 20–29)
CREATININE: 0.69 mg/dL (ref 0.57–1.00)
EGFR: 108 mL/min/{1.73_m2}
GLOBULIN, TOTAL: 1.8 g/dL (ref 1.5–4.5)
GLUCOSE: 138 mg/dL — ABNORMAL HIGH (ref 70–99)
POTASSIUM: 4 mmol/L (ref 3.5–5.2)
SODIUM: 138 mmol/L (ref 134–144)
TOTAL PROTEIN: 6.2 g/dL (ref 6.0–8.5)

## 2024-01-14 LAB — PHOSPHORUS: PHOSPHORUS, SERUM: 3 mg/dL (ref 3.0–4.3)

## 2024-01-14 LAB — GAMMA GT: GAMMA GLUTAMYL TRANSFERASE: 10 IU/L (ref 0–60)

## 2024-01-14 LAB — MAGNESIUM: MAGNESIUM: 1.7 mg/dL (ref 1.6–2.3)

## 2024-01-15 LAB — TACROLIMUS LEVEL: TACROLIMUS BLOOD: 7.4 ng/mL (ref 5.0–20.0)

## 2024-01-17 DIAGNOSIS — Z796 Long-term use of immunosuppressant medication: Principal | ICD-10-CM

## 2024-01-17 DIAGNOSIS — Z944 Liver transplant status: Principal | ICD-10-CM

## 2024-01-17 DIAGNOSIS — Z5181 Encounter for therapeutic drug level monitoring: Principal | ICD-10-CM

## 2024-01-17 DIAGNOSIS — E612 Magnesium deficiency: Principal | ICD-10-CM

## 2024-01-24 DIAGNOSIS — Z944 Liver transplant status: Principal | ICD-10-CM

## 2024-01-24 DIAGNOSIS — Z796 Long-term use of immunosuppressant medication: Principal | ICD-10-CM

## 2024-01-24 DIAGNOSIS — Z5181 Encounter for therapeutic drug level monitoring: Principal | ICD-10-CM

## 2024-01-24 DIAGNOSIS — E612 Magnesium deficiency: Principal | ICD-10-CM

## 2024-01-26 LAB — COMPREHENSIVE METABOLIC PANEL
ALBUMIN: 4.5 g/dL (ref 3.9–4.9)
ALKALINE PHOSPHATASE: 105 IU/L (ref 44–121)
ALT (SGPT): 20 IU/L (ref 0–32)
AST (SGOT): 13 IU/L (ref 0–40)
BILIRUBIN TOTAL (MG/DL) IN SER/PLAS: 0.3 mg/dL (ref 0.0–1.2)
BLOOD UREA NITROGEN: 13 mg/dL (ref 6–24)
BUN / CREAT RATIO: 17 (ref 9–23)
CALCIUM: 8.8 mg/dL (ref 8.7–10.2)
CHLORIDE: 101 mmol/L (ref 96–106)
CO2: 23 mmol/L (ref 20–29)
CREATININE: 0.78 mg/dL (ref 0.57–1.00)
EGFR: 94 mL/min/{1.73_m2}
GLOBULIN, TOTAL: 2.2 g/dL (ref 1.5–4.5)
GLUCOSE: 163 mg/dL — ABNORMAL HIGH (ref 70–99)
POTASSIUM: 4.2 mmol/L (ref 3.5–5.2)
SODIUM: 139 mmol/L (ref 134–144)
TOTAL PROTEIN: 6.7 g/dL (ref 6.0–8.5)

## 2024-01-26 LAB — CBC W/ DIFFERENTIAL
BANDED NEUTROPHILS ABSOLUTE COUNT: 0 10*3/uL (ref 0.0–0.1)
BASOPHILS ABSOLUTE COUNT: 0 10*3/uL (ref 0.0–0.2)
BASOPHILS RELATIVE PERCENT: 0 %
EOSINOPHILS ABSOLUTE COUNT: 0.1 10*3/uL (ref 0.0–0.4)
EOSINOPHILS RELATIVE PERCENT: 5 %
HEMATOCRIT: 42.3 % (ref 34.0–46.6)
HEMOGLOBIN: 13.4 g/dL (ref 11.1–15.9)
IMMATURE GRANULOCYTES: 0 %
LYMPHOCYTES ABSOLUTE COUNT: 1 10*3/uL (ref 0.7–3.1)
LYMPHOCYTES RELATIVE PERCENT: 33 %
MEAN CORPUSCULAR HEMOGLOBIN CONC: 31.7 g/dL (ref 31.5–35.7)
MEAN CORPUSCULAR HEMOGLOBIN: 27.2 pg (ref 26.6–33.0)
MEAN CORPUSCULAR VOLUME: 86 fL (ref 79–97)
MONOCYTES ABSOLUTE COUNT: 0.2 10*3/uL (ref 0.1–0.9)
MONOCYTES RELATIVE PERCENT: 7 %
NEUTROPHILS ABSOLUTE COUNT: 1.7 10*3/uL (ref 1.4–7.0)
NEUTROPHILS RELATIVE PERCENT: 55 %
PLATELET COUNT: 107 10*3/uL — ABNORMAL LOW (ref 150–450)
RED BLOOD CELL COUNT: 4.93 x10E6/uL (ref 3.77–5.28)
RED CELL DISTRIBUTION WIDTH: 13.8 % (ref 11.7–15.4)
WHITE BLOOD CELL COUNT: 3.1 10*3/uL — ABNORMAL LOW (ref 3.4–10.8)

## 2024-01-26 LAB — MAGNESIUM: MAGNESIUM: 1.8 mg/dL (ref 1.6–2.3)

## 2024-01-26 LAB — PHOSPHORUS: PHOSPHORUS, SERUM: 2.8 mg/dL — ABNORMAL LOW (ref 3.0–4.3)

## 2024-01-26 LAB — GAMMA GT: GAMMA GLUTAMYL TRANSFERASE: 19 IU/L (ref 0–60)

## 2024-01-26 LAB — BILIRUBIN, DIRECT: BILIRUBIN DIRECT: 0.1 mg/dL (ref 0.00–0.40)

## 2024-01-26 NOTE — Unmapped (Signed)
 [facility] Specialty and Home Delivery Pharmacy Refill Coordination Note    Specialty Medication(s) to be Shipped:   Transplant: Prednisone 5mg  and tacrolimus 1mg     Other medication(s) to be shipped: Amlodipine, Aspirin, Carvedilol, Metformin, Pantoprazole, Atorvastatin, Test Strips and Lancets     Alicia Armstrong, DOB: Mar 28, 1976  Phone: [phone] (home) [phone] (work)      All above HIPAA information was verified with patient.     Was a Nurse, learning disability used for this call? No    Completed refill call assessment today to schedule patient's medication shipment from the [facility] and Home Delivery Pharmacy  [phone]).  All relevant notes have been reviewed.     Specialty medication(s) and dose(s) confirmed: Regimen is correct and unchanged.   Changes to medications: Miakoda reports no changes at this time.  Changes to insurance: No  New side effects reported not previously addressed with a pharmacist or physician: None reported  Questions for the pharmacist: No    Confirmed patient received a Conservation officer, historic buildings and a Surveyor, mining with first shipment. The patient will receive a drug information handout for each medication shipped and additional FDA Medication Guides as required.       DISEASE/MEDICATION-SPECIFIC INFORMATION        N/A    SPECIALTY MEDICATION ADHERENCE     Medication Adherence    Patient reported X missed doses in the last month: 0  Specialty Medication: Tacrolimus 1mg   Patient is on additional specialty medications: Yes  Additional Specialty Medications: Prednisone 5mg   Patient Reported Additional Medication X Missed Doses in the Last Month: 0  Patient is on more than two specialty medications: No  Informant: patient     Were doses missed due to medication being on hold? No    Tacrolimus 1 mg: 5 days of medicine on hand   Prednisone 5 mg: 5 days of medicine on hand       REFERRAL TO PHARMACIST     Referral to the pharmacist: Not needed      [facility]     Shipping address confirmed in Epic.     Cost and Payment: Patient has a copay of $35.12. They are aware and have authorized the pharmacy to charge the credit card on file.    Delivery Scheduled: Yes, Expected medication delivery date: 01/28/24.     Medication will be delivered via UPS to the prescription address in Epic [place].    Azarian Starace M Santer Torres   [place] [facility] and [facility] Delivery Pharm[facility]  Specialty Technician

## 2024-01-27 DIAGNOSIS — Z944 Liver transplant status: Principal | ICD-10-CM

## 2024-01-27 DIAGNOSIS — Z3490 Encounter for supervision of normal pregnancy, unspecified, unspecified trimester: Principal | ICD-10-CM

## 2024-01-27 DIAGNOSIS — Z131 Encounter for screening for diabetes mellitus: Principal | ICD-10-CM

## 2024-01-27 DIAGNOSIS — Z796 Long term current use of immunosuppressive drug: Principal | ICD-10-CM

## 2024-01-27 DIAGNOSIS — Z13220 Encounter for screening for lipoid disorders: Principal | ICD-10-CM

## 2024-01-27 DIAGNOSIS — Z1159 Encounter for screening for other viral diseases: Principal | ICD-10-CM

## 2024-01-27 DIAGNOSIS — Z1339 Encounter for screening examination for other mental health and behavioral disorders: Principal | ICD-10-CM

## 2024-01-27 LAB — TACROLIMUS LEVEL: TACROLIMUS BLOOD: 6.8 ng/mL (ref 5.0–20.0)

## 2024-01-27 MED FILL — ATORVASTATIN 10 MG TABLET: ORAL | 90 days supply | Qty: 90 | Fill #1

## 2024-01-27 MED FILL — PREDNISONE 5 MG TABLET: ORAL | 30 days supply | Qty: 30 | Fill #2

## 2024-01-27 MED FILL — PANTOPRAZOLE 40 MG TABLET,DELAYED RELEASE: ORAL | 30 days supply | Qty: 30 | Fill #7

## 2024-01-27 MED FILL — AMLODIPINE 5 MG TABLET: ORAL | 30 days supply | Qty: 30 | Fill #9

## 2024-01-27 MED FILL — ACCU-CHEK GUIDE TEST STRIPS: 25 days supply | Qty: 100 | Fill #5

## 2024-01-27 MED FILL — TACROLIMUS 1 MG CAPSULE, IMMEDIATE-RELEASE: ORAL | 30 days supply | Qty: 150 | Fill #3

## 2024-01-27 MED FILL — ACCU-CHEK SOFTCLIX LANCETS: 30 days supply | Qty: 100 | Fill #5

## 2024-01-27 MED FILL — METFORMIN ER 500 MG TABLET,EXTENDED RELEASE 24 HR: ORAL | 30 days supply | Qty: 120 | Fill #4

## 2024-01-27 MED FILL — ASPIRIN 81 MG TABLET,DELAYED RELEASE: ORAL | 30 days supply | Qty: 30 | Fill #6

## 2024-01-27 MED FILL — CARVEDILOL 6.25 MG TABLET: ORAL | 30 days supply | Qty: 60 | Fill #9

## 2024-01-27 NOTE — Unmapped (Signed)
 Called pt and explained to her how to call [facility] to schedule colonoscopy. She verbalized understanding.

## 2024-01-27 NOTE — Unmapped (Signed)
 Lab results from 01/25/24 were reviewed by Dr. Dione Franks as seen in Epic. Liver labs stable, tac in goal. No changes recommended at this time. Pt to repeat labs in 2 weeks.    Placed orders for [facility] and 12 mo labs to [facility] and [facility] for 12 mo visit.    Messaged Pt in MyChart.

## 2024-01-31 DIAGNOSIS — Z796 Long term current use of immunosuppressive drug: Principal | ICD-10-CM

## 2024-01-31 DIAGNOSIS — Z13220 Encounter for screening for lipoid disorders: Principal | ICD-10-CM

## 2024-01-31 DIAGNOSIS — Z944 Liver transplant status: Principal | ICD-10-CM

## 2024-01-31 DIAGNOSIS — Z1339 Encounter for screening examination for other mental health and behavioral disorders: Principal | ICD-10-CM

## 2024-01-31 DIAGNOSIS — Z1159 Encounter for screening for other viral diseases: Principal | ICD-10-CM

## 2024-01-31 DIAGNOSIS — Z3490 Encounter for supervision of normal pregnancy, unspecified, unspecified trimester: Principal | ICD-10-CM

## 2024-01-31 DIAGNOSIS — Z131 Encounter for screening for diabetes mellitus: Principal | ICD-10-CM

## 2024-02-01 NOTE — Unmapped (Signed)
 Colonoscopy  Procedure #1     Procedure #2   [id number]  MRN   GENERIC  Endoscopist     Is the patient's health insurance [employer], [employer], [place] [facility]), [employer] [place], [employer], or Cigna?     Urgent procedure     Are you pregnant?     Are you in the process of scheduling or awaiting results of a heart ultrasound, stress test, or catheterization to evaluate new or worsening chest pain, dizziness, or shortness of breath?     Do you take: Plavix (clopidogrel), Coumadin (warfarin), Lovenox (enoxaparin), Pradaxa (dabigatran), Effient (prasugrel), Xarelto (rivaroxaban), Eliquis (apixaban), Pletal (cilostazol), or Brilinta (ticagrelor)?          Did ordering provider indicate how long to hold this medication in the order comments?          Which of the above medications are you taking?          What is the name of the medical practice that manages this medication?          What is the name of the medical provider who manages this medication?     Do you have hemophilia, von Willebrand disease, or low platelets?     Do you have a pacemaker or implanted cardiac defibrillator?     Has a [facility] provider specified the location(s)?     Which location(s) did the [facility] provider specify?          [facility]          [place]          [facility]   TRUE  Do you see a liver specialist for chronic liver disease?     Is the procedure indication for variceal screening?     Is procedure indication for variceal banding (this does NOT include variceal screening)?     Have you had a heart attack, stroke or heart stent placement within the past 6 months?     Month of event     Year of event (ONLY ENTER LAST 2 DIGITS)        5  Height (feet)   2  Height (inches)   150  Weight (pounds)   27.4  BMI          Did the ordering provider specify a bowel prep?          What bowel prep was specified?     Do you have an ostomy (bag on your stomach that collects your stool)?          Is it an ileostomy?          Is it a colostomy?          Patient doesn't know.     Do you have chronic kidney disease?   TRUE  Do you have chronic constipation or have you had poor quality bowel preps for past colonoscopies?     Do you have Crohn's disease or ulcerative colitis?     Have you had weight loss surgery?          When you walk around your house or grocery store, do you have to stop and rest due to shortness of breath, chest pain, or light-headedness?     Do you ever use supplemental oxygen?   TRUE  Have you been hospitalized for cirrhosis of the liver or heart failure in the last 12 months?     Have you been treated for mouth or throat  cancer with radiation or surgery?     Have you been told that it is difficult for doctors to insert a breathing tube in you during anesthesia?     Have you had a heart or lung transplant?          Are you on dialysis?     Do you have cirrhosis of the liver?     Do you have myasthenia gravis?     Is the patient a prisoner?   ################# ## ###################################################################################################################   MRN:  [id number]   Anticoag Review  No   Nurse Triage  No   GI clinic consult  No   Procedure(s):  Colonoscopy     0   Endoscopist:  GENERIC   Urgent:  No   Prep:  Extended Bowel Prep-Prep Pool                  --------------------------- --- ----------------------------------------------------------------------------------------------------------------------------------------------------------------------------   [facility]:  [facility]                  Requested Locations:              ################# ## ###################################################################################################################

## 2024-02-07 DIAGNOSIS — Z3490 Encounter for supervision of normal pregnancy, unspecified, unspecified trimester: Principal | ICD-10-CM

## 2024-02-07 DIAGNOSIS — Z796 Long term current use of immunosuppressive drug: Principal | ICD-10-CM

## 2024-02-07 DIAGNOSIS — Z1339 Encounter for screening examination for other mental health and behavioral disorders: Principal | ICD-10-CM

## 2024-02-07 DIAGNOSIS — Z1159 Encounter for screening for other viral diseases: Principal | ICD-10-CM

## 2024-02-07 DIAGNOSIS — Z944 Liver transplant status: Principal | ICD-10-CM

## 2024-02-07 DIAGNOSIS — Z131 Encounter for screening for diabetes mellitus: Principal | ICD-10-CM

## 2024-02-07 DIAGNOSIS — Z13220 Encounter for screening for lipoid disorders: Principal | ICD-10-CM

## 2024-02-09 LAB — COMPREHENSIVE METABOLIC PANEL
ALBUMIN: 4.2 g/dL (ref 3.9–4.9)
ALKALINE PHOSPHATASE: 98 IU/L (ref 44–121)
ALT (SGPT): 14 IU/L (ref 0–32)
AST (SGOT): 13 IU/L (ref 0–40)
BILIRUBIN TOTAL (MG/DL) IN SER/PLAS: 0.4 mg/dL (ref 0.0–1.2)
BLOOD UREA NITROGEN: 12 mg/dL (ref 6–24)
BUN / CREAT RATIO: 18 (ref 9–23)
CALCIUM: 8.3 mg/dL — ABNORMAL LOW (ref 8.7–10.2)
CHLORIDE: 102 mmol/L (ref 96–106)
CO2: 19 mmol/L — ABNORMAL LOW (ref 20–29)
CREATININE: 0.66 mg/dL (ref 0.57–1.00)
EGFR: 109 mL/min/{1.73_m2}
GLOBULIN, TOTAL: 2 g/dL (ref 1.5–4.5)
GLUCOSE: 171 mg/dL — ABNORMAL HIGH (ref 70–99)
POTASSIUM: 4 mmol/L (ref 3.5–5.2)
SODIUM: 137 mmol/L (ref 134–144)
TOTAL PROTEIN: 6.2 g/dL (ref 6.0–8.5)

## 2024-02-09 LAB — CBC W/ DIFFERENTIAL
BANDED NEUTROPHILS ABSOLUTE COUNT: 0 10*3/uL (ref 0.0–0.1)
BASOPHILS ABSOLUTE COUNT: 0 10*3/uL (ref 0.0–0.2)
BASOPHILS RELATIVE PERCENT: 0 %
EOSINOPHILS ABSOLUTE COUNT: 0.1 10*3/uL (ref 0.0–0.4)
EOSINOPHILS RELATIVE PERCENT: 4 %
HEMATOCRIT: 40.2 % (ref 34.0–46.6)
HEMOGLOBIN: 13.1 g/dL (ref 11.1–15.9)
IMMATURE GRANULOCYTES: 0 %
LYMPHOCYTES ABSOLUTE COUNT: 1 10*3/uL (ref 0.7–3.1)
LYMPHOCYTES RELATIVE PERCENT: 30 %
MEAN CORPUSCULAR HEMOGLOBIN CONC: 32.6 g/dL (ref 31.5–35.7)
MEAN CORPUSCULAR HEMOGLOBIN: 28.1 pg (ref 26.6–33.0)
MEAN CORPUSCULAR VOLUME: 86 fL (ref 79–97)
MONOCYTES ABSOLUTE COUNT: 0.3 10*3/uL (ref 0.1–0.9)
MONOCYTES RELATIVE PERCENT: 8 %
NEUTROPHILS ABSOLUTE COUNT: 1.8 10*3/uL (ref 1.4–7.0)
NEUTROPHILS RELATIVE PERCENT: 58 %
PLATELET COUNT: 107 10*3/uL — ABNORMAL LOW (ref 150–450)
RED BLOOD CELL COUNT: 4.66 x10E6/uL (ref 3.77–5.28)
RED CELL DISTRIBUTION WIDTH: 14.3 % (ref 11.7–15.4)
WHITE BLOOD CELL COUNT: 3.2 10*3/uL — ABNORMAL LOW (ref 3.4–10.8)

## 2024-02-09 LAB — LIPID PANEL
CHOLESTEROL, TOTAL: 100 mg/dL (ref 100–199)
HDL CHOLESTEROL: 36 mg/dL — ABNORMAL LOW
LDL CHOLESTEROL CALCULATED: 46 mg/dL (ref 0–99)
LDL/HDL RATIO: 1.3 ratio (ref 0.0–3.2)
TRIGLYCERIDES: 89 mg/dL (ref 0–149)
VLDL CHOLESTEROL CAL: 18 mg/dL (ref 5–40)

## 2024-02-09 LAB — GAMMA GT: GAMMA GLUTAMYL TRANSFERASE: 13 IU/L (ref 0–60)

## 2024-02-09 LAB — BILIRUBIN, DIRECT: BILIRUBIN DIRECT: 0.14 mg/dL (ref 0.00–0.40)

## 2024-02-09 LAB — HEPATITIS B DNA, QUANTITATIVE, PCR: HBV IU/ML: NOT DETECTED [IU]/mL

## 2024-02-09 LAB — PHOSPHORUS: PHOSPHORUS, SERUM: 1.9 mg/dL — ABNORMAL LOW (ref 3.0–4.3)

## 2024-02-09 LAB — HEMOGLOBIN A1C: HEMOGLOBIN A1C: 8.1 % — ABNORMAL HIGH (ref 4.8–5.6)

## 2024-02-09 LAB — VITAMIN D 25 HYDROXY: VITAMIN D 25-HYDROXY: 27.3 ng/mL — ABNORMAL LOW (ref 30.0–100.0)

## 2024-02-09 LAB — MAGNESIUM: MAGNESIUM: 1.7 mg/dL (ref 1.6–2.3)

## 2024-02-09 LAB — HCG QUANTITATIVE, BLOOD: GONADOTROPIN, CHORIONIC (HCG) QUANT: <1 m[IU]/mL

## 2024-02-09 NOTE — Unmapped (Signed)
 Pt called re upcoming 1st annual appts. Discussed 6/20 appts and reminded her they are virtual, and she does not need to come to [facility] day. She prefers phone video appts. Told her this tpa will make sure providers know that. Explained that she does need to come to [facility] on 6/24 for her in person appts.  She verbalized understanding of all discussed.    Added phone video to pt's virtual appt notes for 6/20.

## 2024-02-10 MED FILL — OZEMPIC 0.25 MG OR 0.5 MG (2 MG/3 ML) SUBCUTANEOUS PEN INJECTOR: SUBCUTANEOUS | 28 days supply | Qty: 3 | Fill #1

## 2024-02-10 NOTE — Unmapped (Unsigned)
 [facility] Outpatient Nutrition Services   Medical Nutrition Therapy Consultation       Visit Type:    Return Assessment    Referral Reason: :  Liver Transplant 02/15/23      Alicia Armstrong is a 48 y.o. female seen for medical nutrition therapy post liver transplant. Her active problem list, medication list, and allergies were reviewed.     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 Coli septic shock.    I utilized interpreter services for communication assistance between [place] and Spanish (Interpreter Name: Marni Peal).        Anthropometrics   Estimated body mass index is 38.19 kg/m?? as calculated from the following:    Height as of 08/10/23: 140 cm (4' 7.12).    Weight as of 11/09/23: 74.8 kg (165 lb).    Wt Readings from Last 5 Encounters:   11/09/23 74.8 kg (165 lb)   09/15/23 72.6 kg (160 lb)   08/10/23 71.4 kg (157 lb 4.8 oz)   08/10/23 71.4 kg (157 lb 4.8 oz)   05/18/23 64.6 kg (142 lb 8 oz)      Usual body weight: patient reports gaining a significant amount of weight; per records ~27 lbs since last RD visit   142 lbs in September 2024  Ideal Body Weight:   49.5 kg       Nutrition Risk Screening:     Nutrition Focused Physical Exam:  Unable to complete at this time due to virtual visit        Malnutrition Screening:   Malnutrition assessment not yet completed at this time due to inability to complete nutrition focused physical exam (NFPE).      Biochemical Data, Medical Tests and Procedures:  All pertinent labs and imaging reviewed by Lamarr JAYSON Relic, RD/LDN at 9:52 AM 02/10/2024.    Patient reports having some high BG levels recently, but she reports it is nott as high as it was earlier in her post transplant course.      Lab Results   Component Value Date    Hemoglobin A1c 8.1 (H) 02/08/2024    Hemoglobin A1c 6.0 (H) 06/22/2023    Hemoglobin A1C 6.4 (H) 11/09/2023    Hemoglobin A1C 6.1 (H) 06/24/2023    Hemoglobin A1C 4.6 (L) 02/14/2023      No results found for: VITAMINA  No results found for: VITDTOTAL  No results found for: [facility]  Lab Results   Component Value Date    CRP 44.0 (H) 10/31/2022     Lab Results   Component Value Date    Zinc 30 (L) 09/10/2022     Lab Results   Component Value Date    Copper 56 (L) 09/10/2022       Lab Results   Component Value Date    BUN 12 02/08/2024    CREATININE 0.66 02/08/2024    GFRAA >=60 11/25/2017    GFRNONAA >=60 11/25/2017    NA 137 02/08/2024    K 4.0 02/08/2024    CL 102 02/08/2024    CO2 19 (L) 02/08/2024    CALCIUM  8.3 (L) 02/08/2024    PHOS 3.3 11/09/2023    ALBUMIN  4.2 11/09/2023       Medications and Vitamin/Mineral Supplementation:   All nutritionally pertinent medications reviewed on 02/10/2024.   Nutritionally pertinent medications include: colace, lantus  + humalog , prednisone ,  metformin  (started 4 months ago), Ozempic  (started 2 months ago)   She is taking nutrition supplements. magnesium  oxide- AA chelate, biotin     Current Outpatient Medications   Medication Sig Dispense Refill    acetaminophen  (TYLENOL ) 325 MG tablet Take 1-2 tablets (325-650 mg total) by mouth every eight (8) hours as needed for pain or fever. 60 tablet 11    amlodipine  (NORVASC ) 5 MG tablet Take 1 tablet (5 mg total) by mouth daily. 30 tablet 11    aspirin  (ECOTRIN) 81 MG tablet Take 1 tablet (81 mg total) by mouth daily. 30 tablet 11    atorvastatin  (LIPITOR) 10 MG tablet Take 1 tablet (10 mg total) by mouth daily. 90 tablet 3    biotin  5 mg cap Take 1 capsule (5,000 mcg total) by mouth daily. 30 capsule 11    blood sugar diagnostic (ACCU-CHEK GUIDE TEST STRIPS) Strp by Other route four (4) times a day. 100 strip 11    blood-glucose meter kit Use as instructed 1 each 0    carvedilol  (COREG ) 6.25 MG tablet Take 1 tablet (6.25 mg total) by mouth two (2) times a day. 60 tablet 11    docusate sodium  (COLACE) 100 MG capsule Take 1 capsule (100 mg total) by mouth two (2) times a day as needed for constipation. 60 capsule 11    insulin  glargine (BASAGLAR , LANTUS ) 100 unit/mL (3 mL) injection pen Inject 0.18 mL (18 Units total) under the skin nightly. 15 mL 3    lancets Misc Use to check blood sugar as directed with insulin  3 times a day & for symptoms of high or low blood sugar. 100 each 11    magnesium  oxide-Mg AA chelate (MAGNESIUM , AMINO ACID CHELATE,) 133 mg Take 1 tablet by mouth two (2) times a day. 100 tablet 6    metFORMIN  (GLUCOPHAGE -XR) 500 MG 24 hr tablet Take 2 tablets (1,000 mg total) by mouth two (2) times a day. 120 tablet 11    methocarbamol  (ROBAXIN ) 500 MG tablet Take 1 tablet (500 mg total) by mouth four (4) times a day as needed. 120 tablet 1    mycophenolate  (CELLCEPT ) 250 mg capsule Take 1 capsule (250 mg total) by mouth two (2) times a day. 180 capsule 3    pantoprazole  (PROTONIX ) 40 MG tablet Take 1 tablet (40 mg total) by mouth daily. 30 tablet 11    pen needle, diabetic 32 gauge x 5/32 (4 mm) Ndle Use with insulin  up to 4 times/day as needed. 100 each 11    polyethylene glycol (MIRALAX ) 17 gram packet Mix 1 packet in  4 to 8 ounces of liquid and drink daily as needed. 30 packet 11    predniSONE  (DELTASONE ) 5 MG tablet Take 1 tablet (5 mg total) by mouth daily. 30 tablet 5    semaglutide  (OZEMPIC ) 0.25 mg or 0.5 mg (2 mg/3 mL) PnIj Inject 0.5 mg under the skin once a week. 3 mL 11    SENNA 8.6 mg tablet Take 1 tablet by mouth daily as needed for constipation. 30 tablet 2    tacrolimus  (PROGRAF ) 1 MG capsule Take 3 capsules (3 mg total) by mouth daily AND 2 capsules (2 mg total) nightly. 150 capsule 11     No current facility-administered medications for this visit.       Nutrition History:     Dietary Restrictions: No known food allergies or food intolerances.   Patient does not cook with salt and does not use lots of butter  or oil.     Gastrointestinal Issues: Constipation  - patient is taking Miralax  with good effect     Hunger and Satiety: Denied issues.   Appetite has significantly decreased since going on Ozempic  x2 months ago. She reports having to force herself to eat most of the time.     Food Safety and Access: No to little issues noted.     Diet Recall: used to eat 3 meals per day prior to Ozempic ; now having 2 meals   Time Intake   Breakfast Eggs most of the time  - usuall adds avocados, beans, cheese, plantains    Snack (AM)    Lunch    Snack (PM)    Dinner Chicken or red meat and veggies   Snack (HS)      Food-Related History:     Beverages: Ensure infrequently, water throughout the day, coffee in the morning   Dining Out:  minimal   Usual Food Choices: only fruits she wants to eat are mangos or watermelon     Physical Activity:  Physical activity level is light with some exercise.   Patient with minimal fatigue, and no issues with activities of daily living. This Clinical research associate encouraged walking and exercising regularly.     Daily Estimated Nutrient Needs:  Energy: 1800- 2160 kcals [25-30 kcal/kg]  Protein: 80- 95 gm [1.0- 1.2 gm/kg]  Fluid:   [per MD team]    Nutrition Goals & Evaluation      1. Meet estimated daily needs (Met)  2. Normal vitamin levels (Progressing)  3. Balanced macronutrient intake (Progressing)  4. Hemoglobin A1c <7% (New and Not Met)    Nutrition goals reviewed, and relevant barriers identified and addressed: none evident. She is evaluated to have fair willingness and ability to achieve nutrition goals.     Nutrition Assessment     Per the patient's diet recall, nutrition intake has exceeded estimated nutrition needs as shown by weight gain of over 25 lbs. Patient was started on Ozempic  which has effectively suppressed her appetite. Given weight gain encouraged patient to fully stop the Ensure supplementation. Discussed importance of prioritizing protein and vegetable intake while on Ozempic  since appetite is low. Patient reports blood glucose levels have been high, but slightly improved since last RD visit. Discussed importance of appropriate exercise to help with BG levels and weight loss efforts.     Nutrition Intervention      Nutrition Education: weight loss, GLP- 1 nutrition      Materials Provided were: contact information         Nutrition Plan:     Consume protein and vegetable rich diet while on Ozempic    Appropriate hydration   Discontinue Ensure supplementation given good PO intake and weight gain   Increase exercise to 30 minutes, 5 days per week     Follow up will occur if needed.     Food/Nutrition-related history, Anthropometric measurements, and Biochemical data, medical tests, procedures will be assessed at time of follow-up.       Recommendations for Care Team:     Increase exercise to 30 minutes, 5x per week. Continue Ozempic . Stop Ensure supplementation      Patient seen according to established protocol for transplant care.    Time spent 23 minutes     The patient reports they are physically located in [place]  and is currently: at home. I conducted a audio/video visit. I spent  45m 49s on the video call with the patient. I spent  an additional 26 minutes on pre- and post-visit activities on the date of service .       I am located on-site and the patient is located off-site for this visit.       Lamarr Relic, RD, LDN  Abdominal Transplant Dietitian   Pager: [phone] on the date of service .       I am located on-site and the patient is located off-site for this visit.       Lamarr Relic, RD, LDN  Abdominal Transplant Dietitian   Pager: [phone]

## 2024-02-11 ENCOUNTER — Encounter: Admit: 2024-02-11 | Discharge: 2024-02-11 | Payer: BLUE CROSS/BLUE SHIELD

## 2024-02-11 LAB — TACROLIMUS LEVEL: TACROLIMUS BLOOD: 11.5 ng/mL (ref 5.0–20.0)

## 2024-02-11 NOTE — Unmapped (Signed)
 The patient reports they are physically located in [place]  and is currently: at home. I conducted a phone visit.  I spent 20 minutes on the phone call with the patient on the date of service .       **THIS PATIENT WAS NOT SEEN IN PERSON TO MINIMIZE POTENTIAL SPREAD OF COVID-19, PROTECT PATIENTS/PROVIDERS, AND REDUCE PPE UTILIZATION.**    PATIENT NAME: Alicia Armstrong     MR#: [id number]    DOB: March 29, 1976    [facility]  CONFIDENTIAL SOCIAL WORK  ANNUAL LIVER POST TRANSPLANT FOLLOW UP      DATE OF EVALUATION: 02/11/2024    INFORMANTS: Patient/Thyra Rufus Roen    PREFERRED LANGUAGE: English     INTERPRETER UTILIZED: [employer] [phone]    [facility] CARE TEAM:   Post Transplant RN Coordinator: Leita Douse [phone]  Primary Transplant Provider: Lillia Colton Sim Donna Sue Bruno LOISE Perley, [place] LOISE Gaskins, John Waddell Meissner, Chirag Sureshchandra Meade Alan FORBES Gretel    REFERRAL INFORMATION:    Ms.  Montee is a 48 y.o. Hispanic female is s/p transplant for liver transplantation . CSW follows up to assess at one anniversary.     TRANSPLANT DATE:   02/15/2023 (Liver)    MOST RECENT HOSPITAL ADMISSION (@ [place]):   Previous admit date: 03/30/2023 to 09/15/23    FUTURE APPOINTMENTS (@ [place]):   Future Appointments   Date Time Provider Department Center   02/15/2024  8:30 AM [facility]   02/15/2024 10:30 AM Sue Sim Donna, MD [facility]   02/15/2024 12:30 PM [facility] [place]       HOME HEALTH/DME NEEDS AT LAST DC:   HH: N/A   Services: N/A   Contact: n/a  DME: N/A  Other: N/A  Other Remaining:   Ureter stent: No  Staples: No  Surgical drains x0: No  PD Cath: N/A  Central line: N/A    LIFESTYLE:  Physical activity:  Good  Nutrition/Appetite:  Excellent  Sleep: reports that insomnia has improved;     SOCIAL HISTORY:  Marital Status: married  Lives with: adult son/Gerson and spouse  Children/Dependents: adult son (lives with patient and father and adult daughter (married)   Other Social support: church family, relatives on [place]   Housing: rental property;  good Psychologist, forensic, utilities function, and appliances function    CAREGIVING PLAN:  Location:  no change;   Support/Caregivers:  spouse, son, church family   Household tasks/errands:  independent; taking care of household, cooking   Medication:  self administered   Transportation: no changes     COMPLIANCE HISTORY:  Medication Adherence: Excellent  Medication Concerns: denied problems taking medications, concerns about side effects, affordability, problems obtaining medications, and difficulty remembering medications  Other Adherence: Excellent     Side Effects: none    INSURANCE:  [employer] assistance: N/A  Payer/Plan Subscriber Name Rel Member # Group #   BCBS - BCBS OOS PLAN [facility] Spouse  [id number]      [address]   COMMERCIAL INSURANCE * Case,Ramisa KARINA Self [id number]       [address], [place] [address]     VIA SPOUSE EMPLOYMENT  INCOME:   Reports that she attempted to apply and was denied again; she does not want to try anymore. Upon further discussion, it was determined that she declined based on her spouse being over income and a lack of work  credits.     ATTITUDE ABOUT TRANSPLANT:  Expectations: grateful; all Glory to God   Fears/Concerns: denied   What would you change?:  denied   Rec'd Info on Ltr to Donor Family: yes    MENTAL HEALTH HISTORY: reflective of current   Current issues/mood: denies issues  Medications: denies  Therapy: denies  SI/HI: denies    SUBSTANCE HISTORY: reflective of current   Tobacco: denies  Alcohol: denies  Illicit Substances: denies  OTC/Supplements: denies    PAIN HISTORY: reflective of current  Current : denied   Current use of pain medication/pain control: denied     SUMMARY:  Pt reports feeling well overall and is grateful for her recovery to date.  Denies any specific issues/concerns.  Denies any unstable mood.  Denies any psychosocial issues/barriers at home.  Denies any barriers to healthcare/insurance.  Confirmed that she has contact info for CSW and she agreed to call w/ any future issues/concerns, especially in regards to access to medications/healthcare.      Anticipatory guidance/education provided on the following:  --Possibility of readmissions, unplanned clinic visits, extra lab work        RECOMMENDATIONS:   1. No specific f/up at this time.      Delon Ferraris, LCSW, CCTSW  Transplant Case Manager/Social Worker  [facility]  Completed: 02/11/24

## 2024-02-11 NOTE — Unmapped (Signed)
 POST-TRANSPLANT PSYCHOLOGICAL EVALUATION    Patient Name: Alicia Armstrong  Medical Record Number: [id number]  Date of Service: February 11, 2024  Clinical Psychologist: Donnice Sol, PsyD  Evaluation Duration and Procedures:  Clinical interview; record review; case consultation;  Procedure Code(s): [phone] (health & behavior assessment) x 1 units      The patient reports they are physically located in [place]  and is currently: at home. I conducted a phone visit.  I spent 36 minutes on the phone call with the patient on the date of service. Mitzia [phone] of [employer] participated in the conversation today.      This evaluation note may contain sensitive and confidential information regarding the patient???s psychosocial adjustment to living with a chronic medical condition. DO NOT share this information outside [facility] without written consent from the patient explicitly stating that mental health records may be released.     The limits of confidentiality and the purpose of the evaluation were reviewed. The patient was provided with a verbal description of the nature and purpose of the psychological evaluation. I also reviewed the referral source, specific referral question for this evaluation, foreseeable risks/discomforts, benefits, limits of confidentiality, and mandatory reporting requirements of this provider. The patient was given the opportunity to ask questions and receive answers about the present evaluation. Oral consent was provided by the patient.     BACKGROUND INFORMATION: Ms.  Armstrong was seen for a post-transplant psychological evaluation. She is a 48 y.o. married Hispanic female from [place]. 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       BEHAVIORAL OBSERVATIONS:   Alicia Armstrong arrived for her appointment on time. She was interviewed alone. Rapport was easily established. She did not seem motivated to present  herself in an overly favorable light.      MENTAL STATUS EXAM:  Appearance: Not assessed due to phone visit  Motor: Not assessed due to phone visit  Speech/Language: Normal rate, volume, tone, fluency and Language intact, well formed  Mood: Good  Affect: Mood congruent  Thought Process: Logical, linear, clear, coherent, goal directed  Thought Content: Denies SI, HI, self harm, delusions, obsessions, paranoid ideation, or ideas of reference  Perceptual Disturbances: Denies auditory and visual hallucinations, behavior not concerning for response to internal stimuli  Orientation: Oriented to person, place, time, and general circumstances  Attention: Able to fully attend without fluctuations in consciousness  Concentration: Able to fully concentrate and attend  Memory: Immediate, short-term, long-term, and recall grossly intact  Fund of Knowledge: Consistent with level of education and development  Insight: Intact  Judgment: Intact  Impulse Control: Intact      INTERVIEW:    Adherence:  Medication Adherence: Alicia Armstrong takes all of her medications and acknowledged sometimes she may take her medications anywhere from 10-30 minutes after the prescribed time she is supposed to take her medications.   Medication Concerns: problems swallowing. Alicia Armstrong shared that she attempts to take all of her medications in one gulp.  Other Adherence: Good       Lifestyle Issues:  Physical activity:  [name] goes to the gym three times weekly for 1.5-2 hour workouts and does work around her home.   Nutrition/Appetite:  Alicia Armstrong is currently using Ozempic  and eats two meals, primarily eating breakfast and dinner. Currently, Alicia Armstrong uses ensure drinks three times weekly.   Sleep: Alicia Armstrong is getting anywhere between 8-9 hours of sleep during the week and on the weekends 10 hours. She wakes up  once to go to the bathroom. She indicated that she falls asleep instantly upon returning to bed.   Pain (0=no pain; 10=worst pain imaginable): 0/10  Pain Medications:  Denied use.     Substance Issues:  Nicotine/Tobacco: Denied  Current alcohol use: Denied  Illicit drug use: Denied  -If yes type: N/A  Licit substance abuse or misuse: Denied    Social Issues:  Support/Caregiving Issues: Denied  Social Stressors/Changes: Denied     Psychological/Adjustment Issues:    Regret/Remorse/Guilt Over Transplant: Denied  Feelings about transplant: Alicia Armstrong is thankful to God and the physicians for the transplant. Alicia Armstrong is very thankful for a second chance.     Mood Over Past 30 Days (1:NONE to 10:HIGHEST):   --Depression: 1  --Anxiety: 1  --Irritability: 1  --Happiness: 10    Depressive Disorder Symptoms: no    Cognitive Symptoms: Denied    Psychotic Symptoms: Denied    Anxiety:  Worry: Denied  GAD: Denied  Panic: no  Agoraphobia: Denied  Phobias: Denied    PTSD: Denied    Other DSM-V Diagnoses: no    Coping Style: Alicia Armstrong relies heavily on her faith to help her.      INTERVENTION: Health and Behavior      PSYCHIATRIC DIAGNOSES:   None                               IMPRESSIONS, RECOMMENDATIONS, AND PLAN: Overall, Alicia Armstrong appears to be doing well one year out from transplant. She denied missing a dose of her medication, though she noted that sometimes she takes it 10-30 minutes later than the prescribed time period. She sometimes has a difficult time swallowing medications because she chooses to take them all at once. Alicia Armstrong is active as evidenced by going to the gym three times weekly for 1.5-2 hour workouts on top of doing daily tasks at home. She gets anywhere from 8-9 hours of sleep during the week and on the weekends 10 years. She denied using nicotine/tobacco products, alcohol, illicit, and licit substances. She has no regrets, remorse or guilt regarding transplant. She is thankful to God and the surgeons for a second opportunity of life. She denied any care giving concerns or social concerns. She denied MH symptoms at this time and therefore does not meet criteria for a MH condition.She relies heavily on her faith following the transplant. Recommendations  Set alarms on phones to not miss timing for her medications.   Monitor her mood and if she notices a worsening of her mood than she should be re-referred to transplant psychology.     Should the patient or treatment team notice a change in functioning, please refer back to transplant psychology for further evaluation and treatment.        Recommendations discussed with patient? yes  Agreed upon by patient? yes

## 2024-02-12 LAB — PHOSPHATIDYLETHANOL (PETH): PHOSPHATIDYLETHANOL (PETH): NEGATIVE ng/mL

## 2024-02-14 DIAGNOSIS — Z796 Long term current use of immunosuppressive drug: Principal | ICD-10-CM

## 2024-02-14 DIAGNOSIS — Z3490 Encounter for supervision of normal pregnancy, unspecified, unspecified trimester: Principal | ICD-10-CM

## 2024-02-14 DIAGNOSIS — Z1159 Encounter for screening for other viral diseases: Principal | ICD-10-CM

## 2024-02-14 DIAGNOSIS — Z1339 Encounter for screening examination for other mental health and behavioral disorders: Principal | ICD-10-CM

## 2024-02-14 DIAGNOSIS — Z13220 Encounter for screening for lipoid disorders: Principal | ICD-10-CM

## 2024-02-14 DIAGNOSIS — Z131 Encounter for screening for diabetes mellitus: Principal | ICD-10-CM

## 2024-02-14 DIAGNOSIS — Z944 Liver transplant status: Principal | ICD-10-CM

## 2024-02-14 NOTE — Unmapped (Signed)
 [facility] CLINIC PHARMACY NOTE  [name]  [id number]    Medication changes today:   Stop aspirin   Increase Ozempic  to 1 mg once weekly  Decrease Lantus  to 14 units daily  Start Vitamin D3 2000 units daily    Education/Adherence tools provided today:  - Provided updated medication list  - Provided additional education on immunosuppression and transplant related medications including reviewing indications of medications, dosing and side effects  - Discussed adherence reminder tools such as cell phone alarms    Follow up items:  1. BG    Next visit with pharmacy in [place]  ____________________________________________________________________    Alicia Armstrong is a 48 y.o. 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     Immunologic Risk: first transplant    Other PMH significant for n/a    Induction: Methylpred    Post op course uncomplicated    Readmitted 7/6-03/02/23 with back pain and elevated bilirubin  Liver ultrasound showed concern for possible thrombosis or stenosis at common hepatic artery for which LMWH ppx 30 mg BID with plans for continuation for ~6 weeks.    7/15-7/17/24 admitted to [facility] from clinic with cytopenia, received dose of gCSF, 2 units PRBC, transitioned from valganciclovir  to letermovir , enoxaparin  increased to 40 mg BID    04/12/23: Increase MMF to 500 mg BID, decrease prednisone  to 15 mg daily x7d then 10 mg daily indefinitely    Biliary stent removed 05/05/23    Rejection History: Admitted 8/6-8/14/24 for ACR treated with high dose steroids. 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    Infection History: NTD  ___________________________________________________________________    Last seen by pharmacy 1 months ago    Interval History:   Started Ozempic  in April 2025, stopped sitagliptin  and Humalog     Seen by pharmacy today for: medication management and blood glucose management and education    CC:  Patient has no complaints today    General: No issues  Neuro: No issues  CV: No issues  Resp: No issues  GI: Constipation   GU: No issues  Derm: dry skin   Psych: No issues.    Fluid intake: 80 fl oz water daily    There were no vitals filed for this visit.    _________________________________________________________________    Allergies   Allergen Reactions    Penicillins Other (See Comments)     Had a rash with penicillin when she was 48 years old. She tolerated piperacillin nadine 08/25/2016-08/28/2016 with itching without rash. Tolerated Augmentin .       Medications reviewed in EPIC medication station and updated today by the clinical pharmacist practitioner.    Current Outpatient Medications   Medication Instructions    acetaminophen  (TYLENOL ) 325-650 mg, Oral, Every 8 hours PRN    amlodipine  (NORVASC ) 5 mg, Oral, Daily (standard)    atorvastatin  (LIPITOR) 10 mg, Oral, Daily (standard)    biotin  5,000 mcg, Oral, Daily (standard)    blood sugar diagnostic (ACCU-CHEK GUIDE TEST STRIPS) Strp Other, 4 times a day    blood-glucose meter kit Use as instructed    carvedilol  (COREG ) 6.25 mg, Oral, 2 times a day (standard)    cholecalciferol  (vitamin D3-50 mcg (2,000 unit)) (VITAMIN D3-50 MCG (2,000 UNIT)) 50 mcg, Oral, Daily (standard)    docusate sodium  (COLACE) 100 mg, Oral, 2 times a day PRN    insulin  glargine (BASAGLAR , LANTUS ) 14 Units, Subcutaneous, Nightly    lancets Misc Use to check blood sugar as  directed with insulin  3 times a day & for symptoms of high or low blood sugar.    magnesium  oxide-Mg AA chelate (MAGNESIUM , AMINO ACID CHELATE,) 133 mg 1 tablet, Oral, 2 times a day (standard)    metFORMIN  (GLUCOPHAGE -XR) 1,000 mg, Oral, 2 times a day    methocarbamol  (ROBAXIN ) 500 mg, Oral, 4 times a day PRN    mycophenolate  (CELLCEPT ) 250 mg, Oral, 2 times a day (standard)    OZEMPIC  1 mg, Subcutaneous, Every 7 days    pantoprazole  (PROTONIX ) 40 mg, Oral, Daily (standard)    pen needle, diabetic 32 gauge x 5/32 (4 mm) Ndle Use with insulin  up to 4 times/day as needed.    polyethylene glycol (MIRALAX ) 17 gram packet Mix 1 packet in  4 to 8 ounces of liquid and drink daily as needed.    predniSONE  (DELTASONE ) 5 mg, Oral, Daily (standard)    SENNA 8.6 mg tablet 1 tablet, Oral, Daily PRN    tacrolimus  (PROGRAF ) 1 MG capsule Take 3 capsules (3 mg total) by mouth daily AND 2 capsules (2 mg total) nightly.     GRAFT FUNCTION: stable    Lab Results   Component Value Date    AST 13 02/08/2024    ALT 14 02/08/2024    Total Bilirubin 0.4 02/08/2024      Zero hour biopsy: negative for malignancy    Biopsies to date: 8/9: Minimal to mild portal inflammation, mild bile duct injury and focal endotheliitis, compatible with minimal acute cellular rejection, [place] score=3 (1+1+1)      Renal Function: stable    Lab Results   Component Value Date    Creatinine 0.66 02/08/2024    Creatinine 0.78 01/25/2024    Creatinine 0.69 01/13/2024    Creatinine 0.71 12/21/2023     Proteinuria/UPC: none.  No results found for: PCRATIOUR      CURRENT IMMUNOSUPPRESSION:    Tacrolimus  (Prograf ) 3 mg every morning + 2 mg every evening    Tacrolimus  Goal: 6 - 8  Mycophenolate  mofetil (Cellcept ) 250 mg BID   Prednisone  5 mg daily    IMMUNOSUPPRESSION DRUG LEVELS:  Lab Results   Component Value Date    Tacrolimus , Trough 16.7 (H) 03/10/2023    Tacrolimus , Trough 10.4 03/09/2023    Tacrolimus , Trough 10.7 02/23/2023    Tacrolimus  Lvl 11.5 02/08/2024    Tacrolimus  Lvl 6.8 01/25/2024    Tacrolimus  Lvl 7.4 01/13/2024     Prograf  level is accurate 12 hour trough    Patient is tolerating immunosuppression well    WBC/ANC:  low  Lab Results   Component Value Date    WBC 3.2 (L) 02/08/2024       Plan: Upon discussion with Dr. Shermon, will maintain immunosuppression at this time given history of AIH. Continue to monitor      OI Prophylaxis:   CMV Status: D-/ R+, moderate risk.   CMV prophylaxis: letermovir  480 mg x 3 months per protocol (completed 05/13/23)  Valcyte  stopped for neturopenia  No results found for: CMVCP    HSV ppx: acyclovir  400 mg BID (end 05/18/23)     PCP Prophylaxis: pentamidine  300 mg inhalation monthly x 6 months (completed)     Thrush: completed in hospital    Patient is  off prophylaxis    Plan: prophylaxis completed.     HAT ppx:  Meds currently on: aspirin  81 mg daily    CAD prevention:  The 10-year ASCVD risk score (Arnett DK, et al., 2019) is:  1.4%    Values used to calculate the score:      Age: 19 years      Clincally relevant sex: Female      Is Non-Hispanic African American: No      Diabetic: Yes      Tobacco smoker: No      Systolic Blood Pressure: 119 mmHg      Is BP treated: Yes      HDL Cholesterol: 36 mg/dL      Total Cholesterol: 100 mg/dL    Note: For patients with SBP <90 or >200, Total Cholesterol <130 or >320, HDL <20 or >100 which are outside of the allowable range, the calculator will use these upper or lower values to calculate the patient???s risk score.     Statin therapy: Indicated; currently taking Atorvastatin  10mg  daily  Plan: Discontinue aspirin . Continue to monitor.     BP: Goal < 140/90. Clinic BP 119/83  Home BP ranges: Assesses infrequently at home. Last readings 110s/70s    Current meds include: Amlodipine  5 mg daily, Carvedilol  6.25 mg BID  Plan: within goal. Continue to monitor    Anemia of CKD:  H/H:   Lab Results   Component Value Date    HGB 13.1 02/08/2024     Lab Results   Component Value Date    HCT 40.2 02/08/2024     Iron panel:  Lab Results   Component Value Date    IRON 51 03/22/2023    TIBC 229 (L) 03/22/2023    FERRITIN 396.4 (H) 03/08/2023     Lab Results   Component Value Date    Iron Saturation (%) 22 03/22/2023     Prior ESA use: none post txp    Plan: stable. Continue to monitor.     DM:   Lab Results   Component Value Date    A1C 8.1 (H) 02/08/2024   . Goal A1c < 7  History of Dm? No - post txp hyperglycemia    Established with endocrinologist/PCP for BG managment? No  Currently on:   Insulin  glargine 18 units nightly  Metformin  XR 1000 mg BID  Ozempic  0.5 mg weekly (Fridays)    Has been checking regularly at home. Fasting BGs in 90-120s, nighttime 120s-130s     Diet: Pt notes increased carbs (tortillas, breads)    Plan: Increase Ozempic  to 1 mg once weekly. Decrease insulin  glargine to 14 units nightly given controlled FBG. Anticipate higher prandial BG given recently elevated A1c.     Electrolytes: wnl  Lab Results   Component Value Date    Potassium 4.0 02/08/2024    Potassium, Bld 3.8 09/07/2022    Sodium 137 02/08/2024    Sodium Whole Blood 131 (L) 09/07/2022    Magnesium  1.7 02/08/2024    CO2 19 (L) 02/08/2024     Meds currently on: Magnesium  plus protein 133 mg BID  Plan: No change.  Continue to monitor     GI/BM: pt reports 1-2BM. Still some constipation though improves with docusate.  Meds currently on: docusate BID, pantoprazole  40 mg daily  Plan: No change.  Continue to monitor    Pain: pt reports mild pain with menstrual cycle that is alleviated with tylenol  use.  Meds currently on: APAP PRN, Methocarbamol  PRN (not using)  Plan: No change.  Continue to monitor.    Bone health:   Vitamin D Level: last level is 27.3. Goal > 30.   No results found for: VITDTOT, VITDTOTAL  Lab Results   Component Value Date    Calcium  8.3 (L) 02/08/2024    Calcium  8.8 01/25/2024     Last DEXA results:  normal in 2018  Current meds include: none  Plan: Vitamin D level  out of goal. Start vitamin D3 2000 units once daily. Continue to monitor.     Women's/Men's Health:  Daleah Geniva Lohnes is a 47 y.o. Female perimenopausal. Patient reports no men's/women's health issues  Plan: Continue to monitor    Immunizations:  Influenza [Annual]: Received 04/2023    PPSV23: Received 08/2016  PCV20: Received 08/2022    Shingrix Zoster [2 doses, 2 - 6 months apart]: 1st dose given 08/2022 and 2nd dose due 10/2022    COVID-19 [3 primary doses, 2 boosters]: Received 04/2023    Pharmacy preference:  SHD/COP  Medication Refills:  N/a  Medication Access:  N/a    Adherence: Patient has good understanding of medications; was able to independently identify names/doses of immunosuppressants and OI meds.  Patient  does fill their own pill box on a regular basis at home.  Patient brought medication card:no  Pill box:did not bring  Plan: provided basic adherence counseling/intervention    During this visit, the following was completed:   BG log data assessment  BP log data assessment  Labs ordered and evaluated  complex treatment plan >1 DS   I spent a total of 20 minutes face to face with the patient delivering clinical care and providing education/counseling.    All questions/concerns were addressed to the patient's satisfaction.  __________________________________________  Valery JUDITHANN Pina, PharmD  PGY2 Solid Organ Transplant Pharmacy Resident

## 2024-02-15 ENCOUNTER — Ambulatory Visit
Admit: 2024-02-15 | Discharge: 2024-02-16 | Payer: BLUE CROSS/BLUE SHIELD | Attending: Internal Medicine | Primary: Internal Medicine

## 2024-02-15 ENCOUNTER — Ambulatory Visit: Admit: 2024-02-15 | Discharge: 2024-02-16 | Payer: BLUE CROSS/BLUE SHIELD

## 2024-02-15 ENCOUNTER — Inpatient Hospital Stay: Admit: 2024-02-15 | Discharge: 2024-02-16 | Payer: BLUE CROSS/BLUE SHIELD

## 2024-02-15 DIAGNOSIS — Z944 Liver transplant status: Principal | ICD-10-CM

## 2024-02-15 DIAGNOSIS — E119 Type 2 diabetes mellitus without complications: Principal | ICD-10-CM

## 2024-02-15 LAB — HM DEXA SCAN

## 2024-02-15 MED ORDER — INSULIN GLARGINE (U-100) 100 UNIT/ML (3 ML) SUBCUTANEOUS PEN
Freq: Every evening | SUBCUTANEOUS | 3 refills | 107.00000 days | Status: CP
Start: 2024-02-15 — End: ?

## 2024-02-15 MED ORDER — OZEMPIC 1 MG/DOSE (4 MG/3 ML) SUBCUTANEOUS PEN INJECTOR
SUBCUTANEOUS | 11 refills | 28.00000 days | Status: CP
Start: 2024-02-15 — End: ?
  Filled 2024-02-23: qty 3, 28d supply, fill #0

## 2024-02-15 MED ORDER — CHOLECALCIFEROL (VITAMIN D3) 50 MCG (2,000 UNIT) TABLET
ORAL_TABLET | Freq: Every day | ORAL | 3 refills | 90.00000 days | Status: CP
Start: 2024-02-15 — End: ?
  Filled 2024-03-06: qty 100, 100d supply, fill #0

## 2024-02-15 MED FILL — DOCUSATE SODIUM 100 MG CAPSULE: ORAL | 30 days supply | Qty: 60 | Fill #0

## 2024-02-15 NOTE — Unmapped (Signed)
 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     Now doing very well overall.        -IS regimen reviewed with pharmacy., continue current meds.  Will likely need triple IS due to AIH and prior history of ACR.  -counseled on age appropriate cancer screening and annual dermatology visits.  -LFTs reviewed and normal.  -RTC 12 months for annulas      Subjective   History of Present Illness   Accompanied by: N/A (unaccompanied)    48 y.o. 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  Did require biliary stent and had episode of ACR, but since then has been doing very well.  Normal LFTs, adherent with all meds.  No jaundice, encephalopathy, ascites or GI bleeding.    Interval history  9 month post transplant visit.  Doing very well overall.  Normal liver tests.Adherent with all meds.    Interval history  12 month post transplant visit.  Doing very well overall.  Normal liver tests.Adherent with all meds.    Objective   Physical Exam   Vital Signs: BP 119/83 (BP Site: L Arm, BP Position: Sitting, BP Cuff Size: Large)  - Pulse 83  - Temp 36.5 ??C (97.7 ??F) (Tympanic)  - Wt 74.3 kg (163 lb 12.8 oz)  - SpO2 100%  - BMI 37.91 kg/m??   Constitutional: She is in no apparent distress  Eyes: Anicteric sclerae  Cardiovascular: No peripheral edema  Gastrointestinal: Soft, nontender abdomen without hepatosplenomegaly, hernias, or masses  Neurologic: Awake, alert, and oriented to person, place, and time with normal speech and no asterixis      Lab Results   Component Value Date    WBC 3.2 (L) 02/08/2024    HGB 13.1 02/08/2024    HCT 40.2 02/08/2024    PLT 107 (L) 02/08/2024       Lab Results Component Value Date    NA 137 02/08/2024    K 4.0 02/08/2024    CL 102 02/08/2024    CO2 19 (L) 02/08/2024    BUN 12 02/08/2024    CREATININE 0.66 02/08/2024    GLU 152 (H) 11/09/2023    CALCIUM  8.3 (L) 02/08/2024    MG 1.7 02/08/2024    PHOS 3.3 11/09/2023       Lab Results   Component Value Date    BILITOT 0.4 02/08/2024    BILIDIR 0.14 02/08/2024    PROT 6.2 02/08/2024    ALBUMIN  4.2 11/09/2023    ALT 14 02/08/2024    AST 13 02/08/2024    ALKPHOS 98 02/08/2024    GGT 13 02/08/2024       Lab Results   Component Value Date    PT 12.0 03/30/2023    INR 1.07 03/30/2023    APTT 31.8 03/30/2023

## 2024-02-16 DIAGNOSIS — E7849 Other hyperlipidemia: Principal | ICD-10-CM

## 2024-02-16 DIAGNOSIS — Z944 Liver transplant status: Principal | ICD-10-CM

## 2024-02-16 DIAGNOSIS — Z3200 Encounter for pregnancy test, result unknown: Principal | ICD-10-CM

## 2024-02-16 DIAGNOSIS — Z1159 Encounter for screening for other viral diseases: Principal | ICD-10-CM

## 2024-02-16 DIAGNOSIS — E559 Vitamin D deficiency, unspecified: Principal | ICD-10-CM

## 2024-02-16 DIAGNOSIS — Z1339 Encounter for screening examination for other mental health and behavioral disorders: Principal | ICD-10-CM

## 2024-02-16 NOTE — Unmapped (Signed)
 Called pt with the assistance of Spanish interpretor [phone]. Called regarding missed labs which were supposed to have been drawn yesterday. Pt agreed to go to [facility] tomorrow for HBV, Peth,lipid panel, HCG and vitamin D. Explained that order will be separate from routine order and to remind lab tech of that. Pt verbalized understanding.

## 2024-02-16 NOTE — Unmapped (Signed)
 I was the supervising physician in the delivery of the service. Vickii Chafe, MD

## 2024-02-17 NOTE — Unmapped (Signed)
 6/24 Bone density result reviewed by Leita Brock PharmD. No interventions recommended.

## 2024-02-19 LAB — CBC W/ DIFFERENTIAL
BANDED NEUTROPHILS ABSOLUTE COUNT: 0 x10E3/uL (ref 0.0–0.1)
BASOPHILS ABSOLUTE COUNT: 0 x10E3/uL (ref 0.0–0.2)
BASOPHILS RELATIVE PERCENT: 0 %
EOSINOPHILS ABSOLUTE COUNT: 0.1 x10E3/uL (ref 0.0–0.4)
EOSINOPHILS RELATIVE PERCENT: 4 %
HEMATOCRIT: 38.7 % (ref 34.0–46.6)
HEMOGLOBIN: 12.4 g/dL (ref 11.1–15.9)
IMMATURE GRANULOCYTES: 0 %
LYMPHOCYTES ABSOLUTE COUNT: 0.9 x10E3/uL (ref 0.7–3.1)
LYMPHOCYTES RELATIVE PERCENT: 29 %
MEAN CORPUSCULAR HEMOGLOBIN CONC: 32 g/dL (ref 31.5–35.7)
MEAN CORPUSCULAR HEMOGLOBIN: 27.3 pg (ref 26.6–33.0)
MEAN CORPUSCULAR VOLUME: 85 fL (ref 79–97)
MONOCYTES ABSOLUTE COUNT: 0.2 x10E3/uL (ref 0.1–0.9)
MONOCYTES RELATIVE PERCENT: 7 %
NEUTROPHILS ABSOLUTE COUNT: 1.8 x10E3/uL (ref 1.4–7.0)
NEUTROPHILS RELATIVE PERCENT: 60 %
PLATELET COUNT: 87 x10E3/uL — CL (ref 150–450)
RED BLOOD CELL COUNT: 4.55 x10E6/uL (ref 3.77–5.28)
RED CELL DISTRIBUTION WIDTH: 14.1 % (ref 11.7–15.4)
WHITE BLOOD CELL COUNT: 3 x10E3/uL — ABNORMAL LOW (ref 3.4–10.8)

## 2024-02-19 LAB — COMPREHENSIVE METABOLIC PANEL
ALBUMIN: 4.2 g/dL (ref 3.9–4.9)
ALKALINE PHOSPHATASE: 105 IU/L (ref 44–121)
ALT (SGPT): 19 IU/L (ref 0–32)
AST (SGOT): 15 IU/L (ref 0–40)
BILIRUBIN TOTAL (MG/DL) IN SER/PLAS: 0.4 mg/dL (ref 0.0–1.2)
BLOOD UREA NITROGEN: 17 mg/dL (ref 6–24)
BUN / CREAT RATIO: 22 (ref 9–23)
CALCIUM: 8.6 mg/dL — ABNORMAL LOW (ref 8.7–10.2)
CHLORIDE: 101 mmol/L (ref 96–106)
CO2: 20 mmol/L (ref 20–29)
CREATININE: 0.78 mg/dL (ref 0.57–1.00)
EGFR: 94 mL/min/1.73
GLOBULIN, TOTAL: 2.1 g/dL (ref 1.5–4.5)
GLUCOSE: 205 mg/dL — ABNORMAL HIGH (ref 70–99)
POTASSIUM: 4.3 mmol/L (ref 3.5–5.2)
SODIUM: 137 mmol/L (ref 134–144)
TOTAL PROTEIN: 6.3 g/dL (ref 6.0–8.5)

## 2024-02-19 LAB — GAMMA GT: GAMMA GLUTAMYL TRANSFERASE: 11 IU/L (ref 0–60)

## 2024-02-19 LAB — PHOSPHORUS: PHOSPHORUS, SERUM: 2.7 mg/dL — ABNORMAL LOW (ref 3.0–4.3)

## 2024-02-19 LAB — LIPID PANEL
CHOLESTEROL, TOTAL: 101 mg/dL (ref 100–199)
HDL CHOLESTEROL: 36 mg/dL — ABNORMAL LOW
LDL CHOLESTEROL CALCULATED: 44 mg/dL (ref 0–99)
LDL/HDL RATIO: 1.2 ratio (ref 0.0–3.2)
TRIGLYCERIDES: 113 mg/dL (ref 0–149)
VLDL CHOLESTEROL CAL: 21 mg/dL (ref 5–40)

## 2024-02-19 LAB — BILIRUBIN, DIRECT: BILIRUBIN DIRECT: 0.13 mg/dL (ref 0.00–0.40)

## 2024-02-19 LAB — HCG QUANTITATIVE, BLOOD: GONADOTROPIN, CHORIONIC (HCG) QUANT: <1 m[IU]/mL

## 2024-02-19 LAB — VITAMIN D 25 HYDROXY: VITAMIN D 25-HYDROXY: 25.3 ng/mL — ABNORMAL LOW (ref 30.0–100.0)

## 2024-02-19 LAB — HEMOGLOBIN A1C: HEMOGLOBIN A1C: 7.9 % — ABNORMAL HIGH (ref 4.8–5.6)

## 2024-02-19 LAB — MAGNESIUM: MAGNESIUM: 1.5 mg/dL — ABNORMAL LOW (ref 1.6–2.3)

## 2024-02-20 LAB — HEPATITIS B DNA, QUANTITATIVE, PCR: HBV IU/ML: NOT DETECTED [IU]/mL

## 2024-02-21 DIAGNOSIS — Z1339 Encounter for screening examination for other mental health and behavioral disorders: Principal | ICD-10-CM

## 2024-02-21 DIAGNOSIS — Z796 Long term current use of immunosuppressive drug: Principal | ICD-10-CM

## 2024-02-21 DIAGNOSIS — Z1159 Encounter for screening for other viral diseases: Principal | ICD-10-CM

## 2024-02-21 DIAGNOSIS — Z13220 Encounter for screening for lipoid disorders: Principal | ICD-10-CM

## 2024-02-21 DIAGNOSIS — Z3490 Encounter for supervision of normal pregnancy, unspecified, unspecified trimester: Principal | ICD-10-CM

## 2024-02-21 DIAGNOSIS — Z944 Liver transplant status: Principal | ICD-10-CM

## 2024-02-21 DIAGNOSIS — Z131 Encounter for screening for diabetes mellitus: Principal | ICD-10-CM

## 2024-02-21 LAB — TACROLIMUS LEVEL: TACROLIMUS BLOOD: 8.4 ng/mL (ref 5.0–20.0)

## 2024-02-21 MED ORDER — POLYETHYLENE GLYCOL 3350 17 GRAM/DOSE ORAL POWDER
ORAL | 0 refills | 0.00000 days | Status: CP
Start: 2024-02-21 — End: ?

## 2024-02-21 MED ORDER — PEG 3350-ELECTROLYTES 236 GRAM-22.74 GRAM-6.74 GRAM-5.86 GRAM SOLUTION
Freq: Once | ORAL | 0 refills | 1.00000 days | Status: CP
Start: 2024-02-21 — End: 2024-02-21

## 2024-02-21 MED ORDER — BISACODYL 5 MG TABLET,DELAYED RELEASE
ORAL_TABLET | ORAL | 0 refills | 0.00000 days | Status: CP
Start: 2024-02-21 — End: ?

## 2024-02-21 NOTE — Unmapped (Signed)
 Mailed prep instructions and timeline in Spanish. Ordered prep.

## 2024-02-22 NOTE — Unmapped (Signed)
 [facility] Pharm[facility] Refill Coordination Note    Specialty Medication(s) to be Shipped:   Transplant: mycophenolate  mofetil 250mg  and tacrolimus  1mg     Other medication(s) to be shipped: Ozempic      Alicia Armstrong, DOB: [birth year]  Phone: [phone] (home) [phone] (work)      All above HIPAA information was verified with patient.     Was a Nurse, learning disability used for this call? No    Completed refill call assessment today to schedule patient's medication shipment from the [facility]  [phone]).  All relevant notes have been reviewed.     Specialty medication(s) and dose(s) confirmed: Regimen is correct and unchanged.   Changes to medications: [name] reports no changes at this time.  Changes to insurance: No  New side effects reported not previously addressed with a pharmacist or physician: None reported  Questions for the pharmacist: No    Confirmed patient received a Conservation officer, historic buildings and a Surveyor, mining with first shipment. The patient will receive a drug information handout for each medication shipped and additional FDA Medication Guides as required.       DISEASE/MEDICATION-SPECIFIC INFORMATION        N/A    SPECIALTY MEDICATION ADHERENCE     Medication Adherence    Patient reported X missed doses in the last month: 0  Specialty Medication: tacrolimus  1 MG capsule (PROGRAF )  Patient is on additional specialty medications: Yes  Additional Specialty Medications: mycophenolate  250 mg capsule (CELLCEPT )  Patient Reported Additional Medication X Missed Doses in the Last Month: 0  Patient is on more than two specialty medications: No  Any gaps in refill history greater than 2 weeks in the last 3 months: no  Demonstrates understanding of importance of adherence: yes  Informant: patient  Confirmed plan for next specialty medication refill: delivery by pharmacy  Refills needed for supportive medications: not needed          Refill Coordination    Has the Patients' Contact Information Changed: No  Is the Shipping Address Different: No         Were doses missed due to medication being on hold? No    mycophenolate  250 mg: 4 days of medicine on hand   tacrolimus  1  mg: 4 days of medicine on hand       REFERRAL TO PHARMACIST     Referral to the pharmacist: Not needed      [facility]     Shipping address confirmed in Epic.     Cost and Payment: Patient has a copay of $40.09. They are aware and have authorized the pharmacy to charge the credit card on file.    Delivery Scheduled: Yes, Expected medication delivery date: 02/24/24.     Medication will be delivered via UPS to the prescription address in Epic WAM.    Alicia Armstrong   [facility]  Specialty Technician

## 2024-02-23 LAB — PHOSPHATIDYLETHANOL (PETH): PHOSPHATIDYLETHANOL (PETH): NEGATIVE ng/mL

## 2024-02-23 MED FILL — TACROLIMUS 1 MG CAPSULE, IMMEDIATE-RELEASE: ORAL | 30 days supply | Qty: 150 | Fill #4

## 2024-02-23 MED FILL — MYCOPHENOLATE MOFETIL 250 MG CAPSULE: ORAL | 90 days supply | Qty: 180 | Fill #2

## 2024-02-28 DIAGNOSIS — Z3490 Encounter for supervision of normal pregnancy, unspecified, unspecified trimester: Principal | ICD-10-CM

## 2024-02-28 DIAGNOSIS — Z1339 Encounter for screening examination for other mental health and behavioral disorders: Principal | ICD-10-CM

## 2024-02-28 DIAGNOSIS — Z944 Liver transplant status: Principal | ICD-10-CM

## 2024-02-28 DIAGNOSIS — Z131 Encounter for screening for diabetes mellitus: Principal | ICD-10-CM

## 2024-02-28 DIAGNOSIS — Z13220 Encounter for screening for lipoid disorders: Principal | ICD-10-CM

## 2024-02-28 DIAGNOSIS — Z1159 Encounter for screening for other viral diseases: Principal | ICD-10-CM

## 2024-02-28 DIAGNOSIS — Z796 Long term current use of immunosuppressive drug: Principal | ICD-10-CM

## 2024-02-28 MED ORDER — MG-PLUS-PROTEIN 133 MG TABLET
ORAL_TABLET | Freq: Two times a day (BID) | ORAL | 6 refills | 50.00000 days
Start: 2024-02-28 — End: ?

## 2024-02-28 NOTE — Unmapped (Signed)
 [facility] Special[facility] and Home Delivery Pharmacy Refill Coordination Note    Specialty Medication(s) to be Shipped:   Transplant: Prednisone  5mg     Other medication(s) to be shipped: carvedilol  6.25 MG tablet (COREG ), metFORMIN  500 MG 24 hr tablet (GLUCOPHAGE -XR), pantoprazole  40 MG tablet (Protonix ), magnesium  (amino acid chelate) 133 mg tablet (magnesium  oxide-Mg AA chelate), amlodipine  5 MG tablet (NORVASC )     Alicia Armstrong, DOB: December 05, 1975  Phone: [phone] (home) [phone] (work)      All above HIPAA information was verified with patient.     Was a Nurse, learning disability used for this call? No    Completed refill call assessment today to schedule patient's medication shipment from the [facility] and Home Delivery Pharmacy  [phone]).  All relevant notes have been reviewed.     Specialty medication(s) and dose(s) confirmed: Regimen is correct and unchanged.   Changes to medications: Zemira reports no changes at this time.  Changes to insurance: No  New side effects reported not previously addressed with a pharmacist or physician: None reported  Questions for the pharmacist: No    Confirmed patient received a Conservation officer, historic buildings and a Surveyor, mining with first shipment. The patient will receive a drug information handout for each medication shipped and additional FDA Medication Guides as required.       DISEASE/MEDICATION-SPECIFIC INFORMATION        N/A    SPECIALTY MEDICATION ADHERENCE     Medication Adherence    Patient reported X missed doses in the last month: 0  Specialty Medication: predniSONE  5 MG tablet (DELTASONE )  Patient is on additional specialty medications: No  Patient is on more than two specialty medications: No  Any gaps in refill history greater than 2 weeks in the last 3 months: no  Demonstrates understanding of importance of adherence: yes              Were doses missed due to medication being on hold? No    predniSONE  5 mg: 4 days of medicine on hand       REFERRAL TO PHARMACIST Referral to the pharmacist: Not needed      [facility]     Shipping address confirmed in Epic.     Cost and Payment: Patient has a copay of $22.24. They are aware and have authorized the pharmacy to charge the credit card on file.    Delivery Scheduled: Yes, Expected medication delivery date: 03/03/24.     Medication will be delivered via UPS to the prescription address in Epic WAM.    Alicia Armstrong   [facility] and [facility] Delivery Pharm[facility]  Specialty Technician

## 2024-03-01 ENCOUNTER — Ambulatory Visit: Payer: Self-pay | Admitting: Internal Medicine

## 2024-03-01 ENCOUNTER — Other Ambulatory Visit: Payer: Self-pay | Admitting: Internal Medicine

## 2024-03-01 LAB — CBC W/ DIFFERENTIAL
BANDED NEUTROPHILS ABSOLUTE COUNT: 0 x10E3/uL (ref 0.0–0.1)
BASOPHILS ABSOLUTE COUNT: 0 x10E3/uL (ref 0.0–0.2)
BASOPHILS RELATIVE PERCENT: 0 %
EOSINOPHILS ABSOLUTE COUNT: 0.1 x10E3/uL (ref 0.0–0.4)
EOSINOPHILS RELATIVE PERCENT: 2 %
HEMATOCRIT: 41.9 % (ref 34.0–46.6)
HEMOGLOBIN: 13.7 g/dL (ref 11.1–15.9)
IMMATURE GRANULOCYTES: 0 %
LYMPHOCYTES ABSOLUTE COUNT: 1.1 x10E3/uL (ref 0.7–3.1)
LYMPHOCYTES RELATIVE PERCENT: 25 %
MEAN CORPUSCULAR HEMOGLOBIN CONC: 32.7 g/dL (ref 31.5–35.7)
MEAN CORPUSCULAR HEMOGLOBIN: 27.8 pg (ref 26.6–33.0)
MEAN CORPUSCULAR VOLUME: 85 fL (ref 79–97)
MONOCYTES ABSOLUTE COUNT: 0.3 x10E3/uL (ref 0.1–0.9)
MONOCYTES RELATIVE PERCENT: 6 %
NEUTROPHILS ABSOLUTE COUNT: 2.8 x10E3/uL (ref 1.4–7.0)
NEUTROPHILS RELATIVE PERCENT: 67 %
PLATELET COUNT: 119 x10E3/uL — ABNORMAL LOW (ref 150–450)
RED BLOOD CELL COUNT: 4.92 x10E6/uL (ref 3.77–5.28)
RED CELL DISTRIBUTION WIDTH: 14.2 % (ref 11.7–15.4)
WHITE BLOOD CELL COUNT: 4.3 x10E3/uL (ref 3.4–10.8)

## 2024-03-01 LAB — COMPREHENSIVE METABOLIC PANEL
ALBUMIN: 4.6 g/dL (ref 3.9–4.9)
ALKALINE PHOSPHATASE: 113 IU/L (ref 44–121)
ALT (SGPT): 16 IU/L (ref 0–32)
AST (SGOT): 16 IU/L (ref 0–40)
BILIRUBIN TOTAL (MG/DL) IN SER/PLAS: 0.6 mg/dL (ref 0.0–1.2)
BLOOD UREA NITROGEN: 15 mg/dL (ref 6–24)
BUN / CREAT RATIO: 18 (ref 9–23)
CALCIUM: 9.1 mg/dL (ref 8.7–10.2)
CHLORIDE: 99 mmol/L (ref 96–106)
CO2: 21 mmol/L (ref 20–29)
CREATININE: 0.82 mg/dL (ref 0.57–1.00)
EGFR: 89 mL/min/1.73
GLOBULIN, TOTAL: 2 g/dL (ref 1.5–4.5)
GLUCOSE: 134 mg/dL — ABNORMAL HIGH (ref 70–99)
POTASSIUM: 4.1 mmol/L (ref 3.5–5.2)
SODIUM: 137 mmol/L (ref 134–144)
TOTAL PROTEIN: 6.6 g/dL (ref 6.0–8.5)

## 2024-03-01 LAB — MAGNESIUM: MAGNESIUM: 1.8 mg/dL (ref 1.6–2.3)

## 2024-03-01 LAB — HEPATITIS B DNA, QUANTITATIVE, PCR: HBV IU/ML: NOT DETECTED [IU]/mL

## 2024-03-01 LAB — GAMMA GT: GAMMA GLUTAMYL TRANSFERASE: 16 IU/L (ref 0–60)

## 2024-03-01 LAB — LIPID PANEL
CHOLESTEROL, TOTAL: 114 mg/dL (ref 100–199)
HDL CHOLESTEROL: 36 mg/dL — ABNORMAL LOW
LDL CHOLESTEROL CALCULATED: 59 mg/dL (ref 0–99)
LDL/HDL RATIO: 1.6 ratio (ref 0.0–3.2)
TRIGLYCERIDES: 98 mg/dL (ref 0–149)
VLDL CHOLESTEROL CAL: 19 mg/dL (ref 5–40)

## 2024-03-01 LAB — HEMOGLOBIN A1C: HEMOGLOBIN A1C: 7.7 % — ABNORMAL HIGH (ref 4.8–5.6)

## 2024-03-01 LAB — VITAMIN D 25 HYDROXY: VITAMIN D 25-HYDROXY: 26.5 ng/mL — ABNORMAL LOW (ref 30.0–100.0)

## 2024-03-01 LAB — HCG QUANTITATIVE, BLOOD: GONADOTROPIN, CHORIONIC (HCG) QUANT: <1 m[IU]/mL

## 2024-03-01 LAB — PHOSPHORUS: PHOSPHORUS, SERUM: 2.7 mg/dL — ABNORMAL LOW (ref 3.0–4.3)

## 2024-03-01 LAB — BILIRUBIN, DIRECT: BILIRUBIN DIRECT: 0.19 mg/dL (ref 0.00–0.40)

## 2024-03-02 LAB — TACROLIMUS LEVEL: TACROLIMUS BLOOD: 7.8 ng/mL (ref 5.0–20.0)

## 2024-03-06 DIAGNOSIS — Z796 Long term current use of immunosuppressive drug: Principal | ICD-10-CM

## 2024-03-06 DIAGNOSIS — Z1159 Encounter for screening for other viral diseases: Principal | ICD-10-CM

## 2024-03-06 DIAGNOSIS — Z1339 Encounter for screening examination for other mental health and behavioral disorders: Principal | ICD-10-CM

## 2024-03-06 DIAGNOSIS — Z13220 Encounter for screening for lipoid disorders: Principal | ICD-10-CM

## 2024-03-06 DIAGNOSIS — Z944 Liver transplant status: Principal | ICD-10-CM

## 2024-03-06 DIAGNOSIS — Z3490 Encounter for supervision of normal pregnancy, unspecified, unspecified trimester: Principal | ICD-10-CM

## 2024-03-06 DIAGNOSIS — Z131 Encounter for screening for diabetes mellitus: Principal | ICD-10-CM

## 2024-03-06 MED FILL — PREDNISONE 5 MG TABLET: ORAL | 30 days supply | Qty: 30 | Fill #3

## 2024-03-06 MED FILL — METFORMIN ER 500 MG TABLET,EXTENDED RELEASE 24 HR: ORAL | 30 days supply | Qty: 120 | Fill #5

## 2024-03-06 MED FILL — CARVEDILOL 6.25 MG TABLET: ORAL | 30 days supply | Qty: 60 | Fill #10

## 2024-03-06 MED FILL — PANTOPRAZOLE 40 MG TABLET,DELAYED RELEASE: ORAL | 30 days supply | Qty: 30 | Fill #8

## 2024-03-06 MED FILL — AMLODIPINE 5 MG TABLET: ORAL | 30 days supply | Qty: 30 | Fill #10

## 2024-03-07 LAB — PHOSPHATIDYLETHANOL (PETH): PHOSPHATIDYLETHANOL (PETH): NEGATIVE ng/mL

## 2024-03-13 DIAGNOSIS — Z944 Liver transplant status: Principal | ICD-10-CM

## 2024-03-13 DIAGNOSIS — Z131 Encounter for screening for diabetes mellitus: Principal | ICD-10-CM

## 2024-03-13 DIAGNOSIS — Z1159 Encounter for screening for other viral diseases: Principal | ICD-10-CM

## 2024-03-13 DIAGNOSIS — Z796 Long term current use of immunosuppressive drug: Principal | ICD-10-CM

## 2024-03-13 DIAGNOSIS — Z13220 Encounter for screening for lipoid disorders: Principal | ICD-10-CM

## 2024-03-13 DIAGNOSIS — Z1339 Encounter for screening examination for other mental health and behavioral disorders: Principal | ICD-10-CM

## 2024-03-13 DIAGNOSIS — Z3490 Encounter for supervision of normal pregnancy, unspecified, unspecified trimester: Principal | ICD-10-CM

## 2024-03-16 LAB — MAGNESIUM: MAGNESIUM: 1.7 mg/dL (ref 1.6–2.3)

## 2024-03-16 LAB — COMPREHENSIVE METABOLIC PANEL
ALBUMIN: 4.4 g/dL (ref 3.9–4.9)
ALKALINE PHOSPHATASE: 101 IU/L (ref 44–121)
ALT (SGPT): 15 IU/L (ref 0–32)
AST (SGOT): 14 IU/L (ref 0–40)
BILIRUBIN TOTAL (MG/DL) IN SER/PLAS: 0.5 mg/dL (ref 0.0–1.2)
BLOOD UREA NITROGEN: 11 mg/dL (ref 6–24)
BUN / CREAT RATIO: 13 (ref 9–23)
CALCIUM: 9 mg/dL (ref 8.7–10.2)
CHLORIDE: 102 mmol/L (ref 96–106)
CO2: 22 mmol/L (ref 20–29)
CREATININE: 0.84 mg/dL (ref 0.57–1.00)
EGFR: 86 mL/min/1.73
GLOBULIN, TOTAL: 2.3 g/dL (ref 1.5–4.5)
GLUCOSE: 128 mg/dL — ABNORMAL HIGH (ref 70–99)
POTASSIUM: 4.3 mmol/L (ref 3.5–5.2)
SODIUM: 141 mmol/L (ref 134–144)
TOTAL PROTEIN: 6.7 g/dL (ref 6.0–8.5)

## 2024-03-16 LAB — CBC W/ DIFFERENTIAL
BANDED NEUTROPHILS ABSOLUTE COUNT: 0 x10E3/uL (ref 0.0–0.1)
BASOPHILS ABSOLUTE COUNT: 0 x10E3/uL (ref 0.0–0.2)
BASOPHILS RELATIVE PERCENT: 0 %
EOSINOPHILS ABSOLUTE COUNT: 0.1 x10E3/uL (ref 0.0–0.4)
EOSINOPHILS RELATIVE PERCENT: 2 %
HEMATOCRIT: 40.6 % (ref 34.0–46.6)
HEMOGLOBIN: 13.5 g/dL (ref 11.1–15.9)
IMMATURE GRANULOCYTES: 0 %
LYMPHOCYTES ABSOLUTE COUNT: 1.1 x10E3/uL (ref 0.7–3.1)
LYMPHOCYTES RELATIVE PERCENT: 30 %
MEAN CORPUSCULAR HEMOGLOBIN CONC: 33.3 g/dL (ref 31.5–35.7)
MEAN CORPUSCULAR HEMOGLOBIN: 28.6 pg (ref 26.6–33.0)
MEAN CORPUSCULAR VOLUME: 86 fL (ref 79–97)
MONOCYTES ABSOLUTE COUNT: 0.3 x10E3/uL (ref 0.1–0.9)
MONOCYTES RELATIVE PERCENT: 8 %
NEUTROPHILS ABSOLUTE COUNT: 2.1 x10E3/uL (ref 1.4–7.0)
NEUTROPHILS RELATIVE PERCENT: 60 %
PLATELET COUNT: 100 x10E3/uL — CL (ref 150–450)
RED BLOOD CELL COUNT: 4.72 x10E6/uL (ref 3.77–5.28)
RED CELL DISTRIBUTION WIDTH: 14 % (ref 11.7–15.4)
WHITE BLOOD CELL COUNT: 3.5 x10E3/uL (ref 3.4–10.8)

## 2024-03-16 LAB — HEMOGLOBIN A1C: HEMOGLOBIN A1C: 7.2 % — ABNORMAL HIGH (ref 4.8–5.6)

## 2024-03-16 LAB — LIPID PANEL
CHOLESTEROL, TOTAL: 117 mg/dL (ref 100–199)
HDL CHOLESTEROL: 38 mg/dL — ABNORMAL LOW
LDL CHOLESTEROL CALCULATED: 60 mg/dL (ref 0–99)
LDL/HDL RATIO: 1.6 ratio (ref 0.0–3.2)
TRIGLYCERIDES: 100 mg/dL (ref 0–149)
VLDL CHOLESTEROL CAL: 19 mg/dL (ref 5–40)

## 2024-03-16 LAB — PHOSPHORUS: PHOSPHORUS, SERUM: 3.2 mg/dL (ref 3.0–4.3)

## 2024-03-16 LAB — GAMMA GT: GAMMA GLUTAMYL TRANSFERASE: 14 IU/L (ref 0–60)

## 2024-03-16 LAB — HCG QUANTITATIVE, BLOOD: GONADOTROPIN, CHORIONIC (HCG) QUANT: 2 m[IU]/mL

## 2024-03-16 LAB — BILIRUBIN, DIRECT: BILIRUBIN DIRECT: 0.18 mg/dL (ref 0.00–0.40)

## 2024-03-16 LAB — VITAMIN D 25 HYDROXY: VITAMIN D 25-HYDROXY: 27.6 ng/mL — ABNORMAL LOW (ref 30.0–100.0)

## 2024-03-16 NOTE — Unmapped (Signed)
 Attempted to call patient regarding upcoming GI procedure on 03/24/24. No answer. LVM for patient to call nurse line with any questions.    Interpreter Services were used for this call.   Interpreter ID: [id number]

## 2024-03-17 LAB — HEPATITIS B DNA, QUANTITATIVE, PCR: HBV IU/ML: NOT DETECTED [IU]/mL

## 2024-03-17 LAB — TACROLIMUS LEVEL: TACROLIMUS BLOOD: 13.5 ng/mL (ref 5.0–20.0)

## 2024-03-17 NOTE — Unmapped (Signed)
 Sent message to Leita Brock, PharmD:  ---- Message -----   From: Joshua Suzen CROME   Sent: 03/17/2024  12:32 PM EDT   To: Leita HERO Chargualaf, CPP   Subject: Tac 13.5 outlier? previous have been fine an*     Hi.     Her tacrolimus  seems to be an outlier at 13.5 after having 7.8 and 8.4 before this. Also her creatinine and K+ are fine.     It appears she had one last month in the midst of normal levels and I did not see any notes of change.   Tac 3/2 with last change in January   MMF 250mg  bid     Leave it and see repeat?   Kim     Received message back from [place] confirming no change and see a repeat.

## 2024-03-20 DIAGNOSIS — Z3490 Encounter for supervision of normal pregnancy, unspecified, unspecified trimester: Principal | ICD-10-CM

## 2024-03-20 DIAGNOSIS — Z131 Encounter for screening for diabetes mellitus: Principal | ICD-10-CM

## 2024-03-20 DIAGNOSIS — Z13220 Encounter for screening for lipoid disorders: Principal | ICD-10-CM

## 2024-03-20 DIAGNOSIS — Z796 Long term current use of immunosuppressive drug: Principal | ICD-10-CM

## 2024-03-20 DIAGNOSIS — Z944 Liver transplant status: Principal | ICD-10-CM

## 2024-03-20 DIAGNOSIS — Z1159 Encounter for screening for other viral diseases: Principal | ICD-10-CM

## 2024-03-20 DIAGNOSIS — Z1339 Encounter for screening examination for other mental health and behavioral disorders: Principal | ICD-10-CM

## 2024-03-20 MED FILL — OZEMPIC 1 MG/DOSE (4 MG/3 ML) SUBCUTANEOUS PEN INJECTOR: SUBCUTANEOUS | 28 days supply | Qty: 3 | Fill #1

## 2024-03-20 NOTE — Unmapped (Signed)
 Called [facility] and let them know canceling duplicate orders for tests already drawn (peth, lipid panel, Vit d, HgbAlC, hCG quant preg). LC reported peth already in process.     Note-pt annual HBV DNA drawn 7/23 and not detected.

## 2024-03-22 LAB — PHOSPHATIDYLETHANOL (PETH): PHOSPHATIDYLETHANOL (PETH): NEGATIVE ng/mL

## 2024-03-24 ENCOUNTER — Inpatient Hospital Stay: Admit: 2024-03-24 | Discharge: 2024-03-28 | Payer: BLUE CROSS/BLUE SHIELD

## 2024-03-24 ENCOUNTER — Encounter
Admit: 2024-03-24 | Discharge: 2024-03-28 | Payer: BLUE CROSS/BLUE SHIELD | Attending: Anesthesiology | Primary: Anesthesiology

## 2024-03-24 MED ADMIN — Propofol (DIPRIVAN) injection: INTRAVENOUS | @ 18:00:00 | Stop: 2024-03-24

## 2024-03-24 MED ADMIN — phenylephrine 1 mg/10 mL (100 mcg/mL) injection Syrg: INTRAVENOUS | @ 18:00:00 | Stop: 2024-03-24

## 2024-03-24 MED ADMIN — lactated Ringers infusion: 10 mL/h | INTRAVENOUS | @ 18:00:00

## 2024-03-24 NOTE — Unmapped (Signed)
 8/1 colonoscopy report:    Impression:            - The entire examined colon is normal.                         - The distal rectum and anal verge are normal on                          retroflexion view.                         - No specimens collected.  Recommendation:        - Patient has a contact number available for                          emergencies. The signs and symptoms of potential                          delayed complications were discussed with the patient.                          Return to normal activities tomorrow. Written                          discharge instructions were provided to the patient.                         - Resume regular diet.                         - Continue present medications.                         - Repeat colonoscopy in 5 years for surveillance -                          given that last colonoscopy pre-transplant (08/2022)                          showed a traditional serrated adenoma.                         - Return to referring physician as previously                          scheduled.

## 2024-03-27 DIAGNOSIS — Z3490 Encounter for supervision of normal pregnancy, unspecified, unspecified trimester: Principal | ICD-10-CM

## 2024-03-27 DIAGNOSIS — Z1339 Encounter for screening examination for other mental health and behavioral disorders: Principal | ICD-10-CM

## 2024-03-27 DIAGNOSIS — Z944 Liver transplant status: Principal | ICD-10-CM

## 2024-03-27 DIAGNOSIS — Z13220 Encounter for screening for lipoid disorders: Principal | ICD-10-CM

## 2024-03-27 DIAGNOSIS — Z1159 Encounter for screening for other viral diseases: Principal | ICD-10-CM

## 2024-03-27 DIAGNOSIS — Z796 Long term current use of immunosuppressive drug: Principal | ICD-10-CM

## 2024-03-27 DIAGNOSIS — Z131 Encounter for screening for diabetes mellitus: Principal | ICD-10-CM

## 2024-03-27 MED ORDER — AMLODIPINE 5 MG TABLET
ORAL_TABLET | Freq: Every day | ORAL | 11 refills | 30.00000 days
Start: 2024-03-27 — End: ?

## 2024-03-27 MED ORDER — CARVEDILOL 6.25 MG TABLET
ORAL_TABLET | Freq: Two times a day (BID) | ORAL | 11 refills | 30.00000 days
Start: 2024-03-27 — End: ?

## 2024-03-27 NOTE — Unmapped (Signed)
 [facility] Special[facility] and Home Delivery Pharmacy Clinical Assessment & Refill Coordination Note    Alicia Armstrong, DOB: Dec 12, 1975  Phone: [phone] (home) [phone] (work)    All above HIPAA information was verified with patient.     Was a Nurse, learning disability used for this call? 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    Medication list has been reviewed and updated in Epic: Yes    Allergies[2]    Changes to allergies: No    Allergies have been reviewed and updated in Epic: Yes    SPECIALTY MEDICATION ADHERENCE     Mycophenolate  250 mg: 35 days of medicine on hand   Tacrolimus  1 mg: 4 days of medicine on hand       Medication Adherence    Patient reported X missed doses in the last month: 0  Specialty Medication: Mycophenolate  250mg   Patient is on additional specialty medications: Yes  Additional Specialty Medications: Tacrolimus  1mg   Patient Reported Additional Medication X Missed Doses in the Last Month: 0  Patient is on more than two specialty medications: No          Specialty medication(s) dose(s) confirmed: Regimen is correct and unchanged.     Are there any concerns with adherence? No    Adherence counseling provided? Not needed    CLINICAL MANAGEMENT AND INTERVENTION      Clinical Benefit Assessment:    Do you feel the medicine is effective or helping your condition? Yes    Clinical Benefit counseling provided? Not needed    Adverse Effects Assessment:    Are you experiencing any side effects? No    Are you experiencing difficulty administering your medicine? 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 For example, brushing your teeth or getting up in the morning. 0    Have you discussed this with your provider? Not needed    Acute Infection Status:    Acute infections noted within Epic:  No active infections    Patient reported infection: None    Therapy Appropriateness:    Is therapy appropriate based on current medication list, adverse reactions, adherence, clinical benefit and progress toward achieving therapeutic goals? Yes, therapy is appropriate and should be continued     Clinical Intervention:    Was an intervention completed as part of this clinical assessment? No    DISEASE/MEDICATION-SPECIFIC INFORMATION      N/A    Solid Organ Transplant: Not Applicable    PATIENT SPECIFIC NEEDS     Does the patient have any physical, cognitive, or cultural barriers? No    Is the patient high risk? Yes, patient is taking a REMS drug. Medication is dispensed in compliance with REMS program    Does the patient require physician intervention or other additional services (i.e., nutrition, smoking cessation, social work)? No    Does the patient have an additional or emergency contact listed in their chart? Yes    SOCIAL DETERMINANTS OF HEALTH     At the [facility], we have learned that life circumstances - like trouble affording food, housing, utilities, or  transportation can affect the health of many of our patients.   That is why we wanted to ask: are you currently experiencing any life circumstances that are negatively impacting your health and/or quality of life? Patient declined to answer    Social Drivers of Health     Food Insecurity: No Food Insecurity (02/23/2023)    Hunger Vital Sign     Worried About Running Out of Food in the Last Year: Never true     Ran Out of Food in the Last Year: Never true   Tobacco Use: Low Risk  (03/24/2024)    Patient History     Smoking Tobacco Use: Never     Smokeless Tobacco Use: Never     Passive Exposure: Not on file   Transportation Needs: No Transportation Needs (02/23/2023)    PRAPARE - Transportation     Lack of Transportation (Medical): No     Lack of Transportation (Non-Medical): No   Alcohol Use: Not At Risk (02/23/2023)    Alcohol Use     How often do you have a drink containing alcohol?: Never     How many drinks containing alcohol do you have on a typical day when you are drinking?: 1 - 2     How often do you have 5 or more drinks on one occasion?: Never   Housing: Low Risk  (02/23/2023)    Housing     Within the past 12 months, have you ever stayed: outside, in a car, in a tent, in an overnight shelter, or temporarily in someone else's home (i.e. couch-surfing)?: No     Are you worried about losing your housing?: No   Physical Activity: Inactive (02/23/2023)    Exercise Vital Sign     Days of Exercise per Week: 0 days     Minutes of Exercise per Session: 0 min   Utilities: Low Risk  (02/23/2023)    Utilities     Within the past 12 months, have you been unable to get utilities (heat, electricity) when it was really needed?: No   Stress: No Stress Concern Present (02/23/2023)    [employer] of Occupational Health - Occupational Stress Questionnaire     Feeling of Stress : Not at all   Interpersonal Safety: Not At Risk (02/23/2023)    Interpersonal Safety     Unsafe Where You Currently Live: No     Physically Hurt by Anyone: No     Abused by Anyone: No   Substance Use: Not on file (02/24/2024)   Intimate Partner Violence: Not At Risk (02/23/2023)    Humiliation, Afraid, Rape, and Kick questionnaire     Fear of Current or Ex-Partner: No     Emotionally Abused: No     Physically Abused: No     Sexually Abused: No   Social Connections: Socially Integrated (02/23/2023)    Social Connection and Isolation Panel     Frequency of Communication with Friends and Family: More than three times a week     Frequency of Social Gatherings with Friends and Family: More than three times a week     Attends Religious Services: More than 4 times per year     Active Member of [employer] or Organizations: Yes     Attends Banker Meetings: More than 4 times per year     Marital Status: Married Physicist, medical Strain: Low Risk  (02/23/2023)    Overall Financial Resource Strain (CARDIA)     Difficulty of  Paying Living Expenses: Not very hard   Health Literacy: Medium Risk (02/23/2023)    Health Literacy     : Sometimes   Internet Connectivity: No Internet connectivity concern identified (02/23/2023)    Internet Connectivity     Do you have access to internet services: Yes     How do you connect to the internet: Personal Device at home     Is your internet connection strong enough for you to watch video on your device without major problems?: Yes     Do you have enough data to get through the month?: Yes     Does at least one of the devices have a camera that you can use for video chat?: Yes       Would you be willing to receive help with any of the needs that you have identified today? Not applicable       SHIPPING     Specialty Medication(s) to be Shipped:   Transplant: tacrolimus  1mg     Other medication(s) to be shipped: Amlodipine  5mg , Carvediolol 6.25mg , Docusate 100mg , Prednisone  5mg      Changes to insurance: No    Cost and Payment: Patient has a copay of $16.82 (anticipated). They are aware and have authorized the pharmacy to charge the credit card on file.    Delivery Scheduled: Yes, Expected medication delivery date: 8/6.  However, Rx request for refills was sent to the provider as there are none remaining.     Medication will be delivered via UPS to the confirmed prescription address in [facility].    The patient will receive a drug information handout for each medication shipped and additional FDA Medication Guides as required.  Verified that patient has previously received a Conservation officer, historic buildings and a Surveyor, mining.    The patient or caregiver noted above participated in the development of this care plan and knows that they can request review of or adjustments to the care plan at any time.      All of the patient's questions and concerns have been addressed.    Shonn Farruggia, PharmD   [facility] Specialty and Home Delivery Pharmacy Specialty Pharmacist       [1]   No current facility-administered medications for this visit.     No current outpatient medications on file.     Facility-Administered Medications Ordered in Other Visits   Medication Dose Route Frequency Provider Last Rate Last Admin    lactated Ringers  infusion  10 mL/hr Intravenous Continuous Fae Alto, MD 10 mL/hr at 03/24/24 1336 Restarted at 03/24/24 1358   [2]   Allergies  Allergen Reactions    Penicillins Other (See Comments)     Had a rash with penicillin when she was 48 years old. She tolerated piperacillin nadine 08/25/2016-08/28/2016 with itching without rash. Tolerated Augmentin .

## 2024-03-28 DIAGNOSIS — Z944 Liver transplant status: Principal | ICD-10-CM

## 2024-03-28 DIAGNOSIS — D702 Other drug-induced agranulocytosis: Principal | ICD-10-CM

## 2024-03-28 MED FILL — DOCUSATE SODIUM 100 MG CAPSULE: ORAL | 50 days supply | Qty: 100 | Fill #0

## 2024-03-28 MED FILL — PREDNISONE 5 MG TABLET: ORAL | 30 days supply | Qty: 30 | Fill #4

## 2024-03-28 MED FILL — TACROLIMUS 1 MG CAPSULE, IMMEDIATE-RELEASE: ORAL | 30 days supply | Qty: 150 | Fill #5

## 2024-04-01 LAB — CBC W/ DIFFERENTIAL
BANDED NEUTROPHILS ABSOLUTE COUNT: 0.1 x10E3/uL (ref 0.0–0.1)
BASOPHILS ABSOLUTE COUNT: 0 x10E3/uL (ref 0.0–0.2)
BASOPHILS RELATIVE PERCENT: 0 %
EOSINOPHILS ABSOLUTE COUNT: 0.1 x10E3/uL (ref 0.0–0.4)
EOSINOPHILS RELATIVE PERCENT: 3 %
HEMATOCRIT: 40.3 % (ref 34.0–46.6)
HEMOGLOBIN: 13 g/dL (ref 11.1–15.9)
IMMATURE GRANULOCYTES: 1 %
LYMPHOCYTES ABSOLUTE COUNT: 0.6 x10E3/uL — ABNORMAL LOW (ref 0.7–3.1)
LYMPHOCYTES RELATIVE PERCENT: 16 %
MEAN CORPUSCULAR HEMOGLOBIN CONC: 32.3 g/dL (ref 31.5–35.7)
MEAN CORPUSCULAR HEMOGLOBIN: 27.4 pg (ref 26.6–33.0)
MEAN CORPUSCULAR VOLUME: 85 fL (ref 79–97)
MONOCYTES ABSOLUTE COUNT: 0.2 x10E3/uL (ref 0.1–0.9)
MONOCYTES RELATIVE PERCENT: 6 %
NEUTROPHILS ABSOLUTE COUNT: 2.5 x10E3/uL (ref 1.4–7.0)
NEUTROPHILS RELATIVE PERCENT: 74 %
PLATELET COUNT: 101 x10E3/uL — ABNORMAL LOW (ref 150–450)
RED BLOOD CELL COUNT: 4.75 x10E6/uL (ref 3.77–5.28)
RED CELL DISTRIBUTION WIDTH: 13.8 % (ref 11.7–15.4)
WHITE BLOOD CELL COUNT: 3.5 x10E3/uL (ref 3.4–10.8)

## 2024-04-01 LAB — COMPREHENSIVE METABOLIC PANEL
ALBUMIN: 4.7 g/dL (ref 3.9–4.9)
ALKALINE PHOSPHATASE: 99 IU/L (ref 44–121)
ALT (SGPT): 15 IU/L (ref 0–32)
AST (SGOT): 16 IU/L (ref 0–40)
BILIRUBIN TOTAL (MG/DL) IN SER/PLAS: 0.6 mg/dL (ref 0.0–1.2)
BLOOD UREA NITROGEN: 16 mg/dL (ref 6–24)
BUN / CREAT RATIO: 17 (ref 9–23)
CALCIUM: 9.3 mg/dL (ref 8.7–10.2)
CHLORIDE: 100 mmol/L (ref 96–106)
CO2: 23 mmol/L (ref 20–29)
CREATININE: 0.94 mg/dL (ref 0.57–1.00)
EGFR: 75 mL/min/1.73
GLOBULIN, TOTAL: 1.9 g/dL (ref 1.5–4.5)
GLUCOSE: 124 mg/dL — ABNORMAL HIGH (ref 70–99)
POTASSIUM: 4.2 mmol/L (ref 3.5–5.2)
SODIUM: 139 mmol/L (ref 134–144)
TOTAL PROTEIN: 6.6 g/dL (ref 6.0–8.5)

## 2024-04-01 LAB — PHOSPHORUS: PHOSPHORUS, SERUM: 3.1 mg/dL (ref 3.0–4.3)

## 2024-04-01 LAB — MAGNESIUM: MAGNESIUM: 1.6 mg/dL (ref 1.6–2.3)

## 2024-04-01 LAB — BILIRUBIN, DIRECT: BILIRUBIN DIRECT: 0.21 mg/dL (ref 0.00–0.40)

## 2024-04-01 LAB — GAMMA GT: GAMMA GLUTAMYL TRANSFERASE: 12 IU/L (ref 0–60)

## 2024-04-02 LAB — TACROLIMUS LEVEL: TACROLIMUS BLOOD: 8.7 ng/mL (ref 5.0–20.0)

## 2024-04-03 DIAGNOSIS — Z13220 Encounter for screening for lipoid disorders: Principal | ICD-10-CM

## 2024-04-03 DIAGNOSIS — Z3490 Encounter for supervision of normal pregnancy, unspecified, unspecified trimester: Principal | ICD-10-CM

## 2024-04-03 DIAGNOSIS — Z944 Liver transplant status: Principal | ICD-10-CM

## 2024-04-03 DIAGNOSIS — Z1159 Encounter for screening for other viral diseases: Principal | ICD-10-CM

## 2024-04-03 DIAGNOSIS — Z1339 Encounter for screening examination for other mental health and behavioral disorders: Principal | ICD-10-CM

## 2024-04-03 DIAGNOSIS — Z131 Encounter for screening for diabetes mellitus: Principal | ICD-10-CM

## 2024-04-03 DIAGNOSIS — Z796 Long term current use of immunosuppressive drug: Principal | ICD-10-CM

## 2024-04-10 DIAGNOSIS — Z131 Encounter for screening for diabetes mellitus: Principal | ICD-10-CM

## 2024-04-10 DIAGNOSIS — Z3490 Encounter for supervision of normal pregnancy, unspecified, unspecified trimester: Principal | ICD-10-CM

## 2024-04-10 DIAGNOSIS — Z796 Long term current use of immunosuppressive drug: Principal | ICD-10-CM

## 2024-04-10 DIAGNOSIS — Z1339 Encounter for screening examination for other mental health and behavioral disorders: Principal | ICD-10-CM

## 2024-04-10 DIAGNOSIS — Z944 Liver transplant status: Principal | ICD-10-CM

## 2024-04-10 DIAGNOSIS — Z13220 Encounter for screening for lipoid disorders: Principal | ICD-10-CM

## 2024-04-10 DIAGNOSIS — Z1159 Encounter for screening for other viral diseases: Principal | ICD-10-CM

## 2024-04-11 DIAGNOSIS — Z944 Liver transplant status: Principal | ICD-10-CM

## 2024-04-11 MED ORDER — AMLODIPINE 5 MG TABLET
ORAL_TABLET | Freq: Every day | ORAL | 3 refills | 30.00000 days | Status: CP
Start: 2024-04-11 — End: ?
  Filled 2024-04-20: qty 30, 30d supply, fill #0

## 2024-04-11 MED ORDER — CARVEDILOL 6.25 MG TABLET
ORAL_TABLET | Freq: Two times a day (BID) | ORAL | 3 refills | 30.00000 days | Status: CP
Start: 2024-04-11 — End: ?
  Filled 2024-04-20: qty 60, 30d supply, fill #0

## 2024-04-11 MED ORDER — DOCUSATE SODIUM 100 MG CAPSULE
ORAL_CAPSULE | Freq: Two times a day (BID) | ORAL | 3 refills | 30.00000 days | Status: CP | PRN
Start: 2024-04-11 — End: ?
  Filled 2024-04-20: qty 100, 50d supply, fill #0

## 2024-04-11 MED ORDER — MG-PLUS-PROTEIN 133 MG TABLET
ORAL_TABLET | Freq: Two times a day (BID) | ORAL | 3 refills | 50.00000 days | Status: CP
Start: 2024-04-11 — End: ?
  Filled 2024-04-20: qty 100, 50d supply, fill #0

## 2024-04-11 NOTE — Unmapped (Signed)
 Returned pt call as she left me VM requesting refills. Scripts (amlodipine , carvedilol , magnesium  and docusate) sent to [facility] specialty with 3 refills as patient does not have a PCP appt scheduled. Pt reported she would like new PCP, referral sent to Internal medicine and phone number to clinic given to patient to call and schedule. Explained to patient since she is now 1 year s/p transplant she will need to establish routing health maint with her PCP, including BP management. Pt reports her BP on Sunday 102/84; txp clinic BP 119/83 in June.

## 2024-04-17 DIAGNOSIS — Z13220 Encounter for screening for lipoid disorders: Principal | ICD-10-CM

## 2024-04-17 DIAGNOSIS — Z1159 Encounter for screening for other viral diseases: Principal | ICD-10-CM

## 2024-04-17 DIAGNOSIS — Z944 Liver transplant status: Principal | ICD-10-CM

## 2024-04-17 DIAGNOSIS — Z131 Encounter for screening for diabetes mellitus: Principal | ICD-10-CM

## 2024-04-17 DIAGNOSIS — Z3490 Encounter for supervision of normal pregnancy, unspecified, unspecified trimester: Principal | ICD-10-CM

## 2024-04-17 DIAGNOSIS — Z1339 Encounter for screening examination for other mental health and behavioral disorders: Principal | ICD-10-CM

## 2024-04-17 DIAGNOSIS — Z796 Long term current use of immunosuppressive drug: Principal | ICD-10-CM

## 2024-04-18 MED ORDER — PANTOPRAZOLE 40 MG TABLET,DELAYED RELEASE
ORAL_TABLET | Freq: Every day | ORAL | 11 refills | 30.00000 days
Start: 2024-04-18 — End: ?

## 2024-04-18 NOTE — Unmapped (Signed)
 [name] Specialty and Home Delivery Pharmacy Refill Coordination Note    Specialty Medication(s) to be Shipped:   Transplant: tacrolimus  1mg     Other medication(s) to be shipped: Magnesium , Atorvastatin , Amlodipine , Carvedilol , Docusate and Ozempic     Specialty Medications not needed at this time: Transplant: mycophenolate  mofetil 250mg      Alicia Armstrong, DOB: Dec 31, 1975  Phone: [phone] (home) [phone] (work)      All above HIPAA information was verified with patient.     Was a Nurse, learning disability used for this call? No    Completed refill call assessment today to schedule patient's medication shipment from the [facility] and [facility] Delivery Pharmacy  [phone]).  All relevant notes have been reviewed.     Specialty medication(s) and dose(s) confirmed: Regimen is correct and unchanged.   Changes to medications: Little reports no changes at this time.  Changes to insurance: No  New side effects reported not previously addressed with a pharmacist or physician: None reported  Questions for the pharmacist: No    Confirmed patient received a Conservation officer, historic buildings and a Surveyor, mining with first shipment. The patient will receive a drug information handout for each medication shipped and additional FDA Medication Guides as required.       DISEASE/MEDICATION-SPECIFIC INFORMATION        N/A    SPECIALTY MEDICATION ADHERENCE     Medication Adherence    Specialty Medication: tacrolimus  1 MG capsule (PROGRAF )  Patient is on additional specialty medications: No              Were doses missed due to medication being on hold? No      tacrolimus  1 MG capsule (PROGRAF ): 4 days of medicine on hand       REFERRAL TO PHARMACIST     Referral to the pharmacist: Not needed      [facility]     Shipping address confirmed in Epic.     Cost and Payment: Patient has a copay of $50.28. They are aware and have authorized the pharmacy to charge the credit card on file.    Delivery Scheduled: Yes, Expected medication delivery date: 8.29.25.     Medication will be delivered via UPS to the prescription address in Epic WAM.    Doyal Hurst   [facility] and [facility] Delivery Pharm[facility]  Specialty Technician

## 2024-04-20 MED FILL — OZEMPIC 1 MG/DOSE (4 MG/3 ML) SUBCUTANEOUS PEN INJECTOR: SUBCUTANEOUS | 28 days supply | Qty: 3 | Fill #2

## 2024-04-20 MED FILL — TACROLIMUS 1 MG CAPSULE, IMMEDIATE-RELEASE: ORAL | 30 days supply | Qty: 150 | Fill #6

## 2024-04-20 MED FILL — ATORVASTATIN 10 MG TABLET: ORAL | 90 days supply | Qty: 90 | Fill #2

## 2024-04-22 LAB — CBC W/ DIFFERENTIAL
BANDED NEUTROPHILS ABSOLUTE COUNT: 0 x10E3/uL (ref 0.0–0.1)
BASOPHILS ABSOLUTE COUNT: 0 x10E3/uL (ref 0.0–0.2)
BASOPHILS RELATIVE PERCENT: 0 %
EOSINOPHILS ABSOLUTE COUNT: 0.1 x10E3/uL (ref 0.0–0.4)
EOSINOPHILS RELATIVE PERCENT: 4 %
HEMATOCRIT: 41.8 % (ref 34.0–46.6)
HEMOGLOBIN: 13.7 g/dL (ref 11.1–15.9)
IMMATURE GRANULOCYTES: 0 %
LYMPHOCYTES ABSOLUTE COUNT: 0.9 x10E3/uL (ref 0.7–3.1)
LYMPHOCYTES RELATIVE PERCENT: 26 %
MEAN CORPUSCULAR HEMOGLOBIN CONC: 32.8 g/dL (ref 31.5–35.7)
MEAN CORPUSCULAR HEMOGLOBIN: 27.7 pg (ref 26.6–33.0)
MEAN CORPUSCULAR VOLUME: 84 fL (ref 79–97)
MONOCYTES ABSOLUTE COUNT: 0.3 x10E3/uL (ref 0.1–0.9)
MONOCYTES RELATIVE PERCENT: 8 %
NEUTROPHILS ABSOLUTE COUNT: 2 x10E3/uL (ref 1.4–7.0)
NEUTROPHILS RELATIVE PERCENT: 62 %
PLATELET COUNT: 126 x10E3/uL — ABNORMAL LOW (ref 150–450)
RED BLOOD CELL COUNT: 4.95 x10E6/uL (ref 3.77–5.28)
RED CELL DISTRIBUTION WIDTH: 13.8 % (ref 11.7–15.4)
WHITE BLOOD CELL COUNT: 3.2 x10E3/uL — ABNORMAL LOW (ref 3.4–10.8)

## 2024-04-22 LAB — COMPREHENSIVE METABOLIC PANEL
ALBUMIN: 4.8 g/dL (ref 3.9–4.9)
ALKALINE PHOSPHATASE: 91 IU/L (ref 44–121)
ALT (SGPT): 12 IU/L (ref 0–32)
AST (SGOT): 15 IU/L (ref 0–40)
BILIRUBIN TOTAL (MG/DL) IN SER/PLAS: 0.4 mg/dL (ref 0.0–1.2)
BLOOD UREA NITROGEN: 17 mg/dL (ref 6–24)
BUN / CREAT RATIO: 22 (ref 9–23)
CALCIUM: 9.5 mg/dL (ref 8.7–10.2)
CHLORIDE: 102 mmol/L (ref 96–106)
CO2: 23 mmol/L (ref 20–29)
CREATININE: 0.79 mg/dL (ref 0.57–1.00)
EGFR: 93 mL/min/1.73
GLOBULIN, TOTAL: 2.2 g/dL (ref 1.5–4.5)
GLUCOSE: 114 mg/dL — ABNORMAL HIGH (ref 70–99)
POTASSIUM: 4.7 mmol/L (ref 3.5–5.2)
SODIUM: 142 mmol/L (ref 134–144)
TOTAL PROTEIN: 7 g/dL (ref 6.0–8.5)

## 2024-04-22 LAB — MAGNESIUM: MAGNESIUM: 2.2 mg/dL (ref 1.6–2.3)

## 2024-04-22 LAB — GAMMA GT: GAMMA GLUTAMYL TRANSFERASE: 12 IU/L (ref 0–60)

## 2024-04-22 LAB — PHOSPHORUS: PHOSPHORUS, SERUM: 3 mg/dL (ref 3.0–4.3)

## 2024-04-22 LAB — BILIRUBIN, DIRECT: BILIRUBIN DIRECT: 0.15 mg/dL (ref 0.00–0.40)

## 2024-04-24 DIAGNOSIS — Z796 Long term current use of immunosuppressive drug: Principal | ICD-10-CM

## 2024-04-24 DIAGNOSIS — Z944 Liver transplant status: Principal | ICD-10-CM

## 2024-04-24 DIAGNOSIS — Z1159 Encounter for screening for other viral diseases: Principal | ICD-10-CM

## 2024-04-24 DIAGNOSIS — Z13220 Encounter for screening for lipoid disorders: Principal | ICD-10-CM

## 2024-04-24 DIAGNOSIS — Z3490 Encounter for supervision of normal pregnancy, unspecified, unspecified trimester: Principal | ICD-10-CM

## 2024-04-24 DIAGNOSIS — Z131 Encounter for screening for diabetes mellitus: Principal | ICD-10-CM

## 2024-04-24 DIAGNOSIS — Z1339 Encounter for screening examination for other mental health and behavioral disorders: Principal | ICD-10-CM

## 2024-04-24 LAB — TACROLIMUS LEVEL: TACROLIMUS BLOOD: 6.2 ng/mL (ref 5.0–20.0)

## 2024-04-28 MED ORDER — PANTOPRAZOLE 40 MG TABLET,DELAYED RELEASE
ORAL_TABLET | Freq: Every day | ORAL | 11 refills | 30.00000 days
Start: 2024-04-28 — End: ?

## 2024-05-01 DIAGNOSIS — Z131 Encounter for screening for diabetes mellitus: Principal | ICD-10-CM

## 2024-05-01 DIAGNOSIS — Z1159 Encounter for screening for other viral diseases: Principal | ICD-10-CM

## 2024-05-01 DIAGNOSIS — Z13220 Encounter for screening for lipoid disorders: Principal | ICD-10-CM

## 2024-05-01 DIAGNOSIS — Z3490 Encounter for supervision of normal pregnancy, unspecified, unspecified trimester: Principal | ICD-10-CM

## 2024-05-01 DIAGNOSIS — Z944 Liver transplant status: Principal | ICD-10-CM

## 2024-05-01 DIAGNOSIS — Z796 Long term current use of immunosuppressive drug: Principal | ICD-10-CM

## 2024-05-01 DIAGNOSIS — Z1339 Encounter for screening examination for other mental health and behavioral disorders: Principal | ICD-10-CM

## 2024-05-04 NOTE — Unmapped (Signed)
 Sent mychart message asking pt to please contact this tpa re scheduling annual appt by [employer] or phone call. Left call-back number. 1st call.

## 2024-05-08 DIAGNOSIS — Z3490 Encounter for supervision of normal pregnancy, unspecified, unspecified trimester: Principal | ICD-10-CM

## 2024-05-08 DIAGNOSIS — Z796 Long term current use of immunosuppressive drug: Principal | ICD-10-CM

## 2024-05-08 DIAGNOSIS — Z944 Liver transplant status: Principal | ICD-10-CM

## 2024-05-08 DIAGNOSIS — Z1159 Encounter for screening for other viral diseases: Principal | ICD-10-CM

## 2024-05-08 DIAGNOSIS — Z131 Encounter for screening for diabetes mellitus: Principal | ICD-10-CM

## 2024-05-08 DIAGNOSIS — Z1339 Encounter for screening examination for other mental health and behavioral disorders: Principal | ICD-10-CM

## 2024-05-08 DIAGNOSIS — Z13220 Encounter for screening for lipoid disorders: Principal | ICD-10-CM

## 2024-05-12 MED ORDER — LANCETS
11 refills | 0.00000 days
Start: 2024-05-12 — End: ?

## 2024-05-12 MED ORDER — PANTOPRAZOLE 40 MG TABLET,DELAYED RELEASE
ORAL_TABLET | Freq: Every day | ORAL | 11 refills | 30.00000 days
Start: 2024-05-12 — End: ?

## 2024-05-12 MED ORDER — ACCU-CHEK GUIDE TEST STRIPS
ORAL_STRIP | Freq: Four times a day (QID) | 11 refills | 0.00000 days
Start: 2024-05-12 — End: ?

## 2024-05-12 NOTE — Unmapped (Signed)
 [facility] Specialty and Home Delivery Pharmacy Refill Coordination Note    Specialty Medication(s) to be Shipped:   Transplant: mycophenolate  mofetil 250mg  and tacrolimus  1mg     Other medication(s) to be shipped: test strips, lancets, Ozempic , prednisone , pantoprazole , amlodipine , carvedilol , docusate, magnesium , metformin     Specialty Medications not needed at this time: N/A   1  Alicia Armstrong, DOB: 1975-11-25  Phone: [phone] (home) [phone] (work)      All above HIPAA information was verified with patient.     Was a Nurse, learning disability used for this call? No    Completed refill call assessment today to schedule patient's medication shipment from the [facility] and Home Delivery Pharmacy  [phone]).  All relevant notes have been reviewed.     Specialty medication(s) and dose(s) confirmed: Regimen is correct and unchanged.   Changes to medications: [name] reports no changes at this time.  Changes to insurance: No  New side effects reported not previously addressed with a pharmacist or physician: None reported  Questions for the pharmacist: No    Confirmed patient received a Conservation officer, historic buildings and a Surveyor, mining with first shipment. The patient will receive a drug information handout for each medication shipped and additional FDA Medication Guides as required.       DISEASE/MEDICATION-SPECIFIC INFORMATION        N/A    SPECIALTY MEDICATION ADHERENCE     Medication Adherence    Patient reported X missed doses in the last month: 0  Specialty Medication: mycophenolate  250 mg capsule (CELLCEPT )  Patient is on additional specialty medications: Yes  Additional Specialty Medications: tacrolimus  1 MG capsule (PROGRAF )  Patient Reported Additional Medication X Missed Doses in the Last Month: 0  Patient is on more than two specialty medications: No  Any gaps in refill history greater than 2 weeks in the last 3 months: no  Demonstrates understanding of importance of adherence: yes  Informant: patient  Confirmed plan for next specialty medication refill: delivery by pharmacy  Refills needed for supportive medications: not needed          Refill Coordination    Has the Patients' Contact Information Changed: No  Is the Shipping Address Different: No         Were doses missed due to medication being on hold? No    tacrolimus  1  mg: 7 days of medicine on hand   mycophenolate  250 mg: 9 days of medicine on hand       REFERRAL TO PHARMACIST     Referral to the pharmacist: Not needed      [facility]     Shipping address confirmed in Epic.     Cost and Payment: Patient has a copay of $77.37. They are aware and have authorized the pharmacy to charge the credit card on file.    Delivery Scheduled: Yes, Expected medication delivery date: 05/17/24.     Medication will be delivered via UPS to the prescription address in Epic WAM.    [name]   [facility] Specialty and Home Delivery Pharmacy  Specialty Technician

## 2024-05-13 LAB — CBC W/ DIFFERENTIAL
BANDED NEUTROPHILS ABSOLUTE COUNT: 0 x10E3/uL (ref 0.0–0.1)
BASOPHILS ABSOLUTE COUNT: 0 x10E3/uL (ref 0.0–0.2)
BASOPHILS RELATIVE PERCENT: 0 %
EOSINOPHILS ABSOLUTE COUNT: 0.1 x10E3/uL (ref 0.0–0.4)
EOSINOPHILS RELATIVE PERCENT: 3 %
HEMATOCRIT: 40.2 % (ref 34.0–46.6)
HEMOGLOBIN: 13 g/dL (ref 11.1–15.9)
IMMATURE GRANULOCYTES: 0 %
LYMPHOCYTES ABSOLUTE COUNT: 1.1 x10E3/uL (ref 0.7–3.1)
LYMPHOCYTES RELATIVE PERCENT: 33 %
MEAN CORPUSCULAR HEMOGLOBIN CONC: 32.3 g/dL (ref 31.5–35.7)
MEAN CORPUSCULAR HEMOGLOBIN: 27.6 pg (ref 26.6–33.0)
MEAN CORPUSCULAR VOLUME: 85 fL (ref 79–97)
MONOCYTES ABSOLUTE COUNT: 0.3 x10E3/uL (ref 0.1–0.9)
MONOCYTES RELATIVE PERCENT: 9 %
NEUTROPHILS ABSOLUTE COUNT: 1.7 x10E3/uL (ref 1.4–7.0)
NEUTROPHILS RELATIVE PERCENT: 55 %
PLATELET COUNT: 112 x10E3/uL — ABNORMAL LOW (ref 150–450)
RED BLOOD CELL COUNT: 4.71 x10E6/uL (ref 3.77–5.28)
RED CELL DISTRIBUTION WIDTH: 13.9 % (ref 11.7–15.4)
WHITE BLOOD CELL COUNT: 3.2 x10E3/uL — ABNORMAL LOW (ref 3.4–10.8)

## 2024-05-13 LAB — COMPREHENSIVE METABOLIC PANEL
ALBUMIN: 4.6 g/dL (ref 3.9–4.9)
ALKALINE PHOSPHATASE: 93 IU/L (ref 41–116)
ALT (SGPT): 13 IU/L (ref 0–32)
AST (SGOT): 12 IU/L (ref 0–40)
BILIRUBIN TOTAL (MG/DL) IN SER/PLAS: 0.4 mg/dL (ref 0.0–1.2)
BLOOD UREA NITROGEN: 14 mg/dL (ref 6–24)
BUN / CREAT RATIO: 18 (ref 9–23)
CALCIUM: 9.3 mg/dL (ref 8.7–10.2)
CHLORIDE: 99 mmol/L (ref 96–106)
CO2: 24 mmol/L (ref 20–29)
CREATININE: 0.79 mg/dL (ref 0.57–1.00)
EGFR: 93 mL/min/1.73
GLOBULIN, TOTAL: 2 g/dL (ref 1.5–4.5)
GLUCOSE: 117 mg/dL — ABNORMAL HIGH (ref 70–99)
POTASSIUM: 4.3 mmol/L (ref 3.5–5.2)
SODIUM: 138 mmol/L (ref 134–144)
TOTAL PROTEIN: 6.6 g/dL (ref 6.0–8.5)

## 2024-05-13 LAB — MAGNESIUM: MAGNESIUM: 1.9 mg/dL (ref 1.6–2.3)

## 2024-05-13 LAB — GAMMA GT: GAMMA GLUTAMYL TRANSFERASE: 11 IU/L (ref 0–60)

## 2024-05-13 LAB — PHOSPHORUS: PHOSPHORUS, SERUM: 2.8 mg/dL — ABNORMAL LOW (ref 3.0–4.3)

## 2024-05-13 LAB — BILIRUBIN, DIRECT: BILIRUBIN DIRECT: 0.13 mg/dL (ref 0.00–0.40)

## 2024-05-14 LAB — TACROLIMUS LEVEL: TACROLIMUS BLOOD: 5.8 ng/mL (ref 5.0–20.0)

## 2024-05-15 DIAGNOSIS — Z944 Liver transplant status: Principal | ICD-10-CM

## 2024-05-15 DIAGNOSIS — Z1339 Encounter for screening examination for other mental health and behavioral disorders: Principal | ICD-10-CM

## 2024-05-15 DIAGNOSIS — Z796 Long term current use of immunosuppressive drug: Principal | ICD-10-CM

## 2024-05-15 DIAGNOSIS — Z3490 Encounter for supervision of normal pregnancy, unspecified, unspecified trimester: Principal | ICD-10-CM

## 2024-05-15 DIAGNOSIS — Z13220 Encounter for screening for lipoid disorders: Principal | ICD-10-CM

## 2024-05-15 DIAGNOSIS — Z131 Encounter for screening for diabetes mellitus: Principal | ICD-10-CM

## 2024-05-15 DIAGNOSIS — Z1159 Encounter for screening for other viral diseases: Principal | ICD-10-CM

## 2024-05-16 MED FILL — MYCOPHENOLATE MOFETIL 250 MG CAPSULE: ORAL | 90 days supply | Qty: 180 | Fill #3

## 2024-05-16 MED FILL — OZEMPIC 1 MG/DOSE (4 MG/3 ML) SUBCUTANEOUS PEN INJECTOR: SUBCUTANEOUS | 28 days supply | Qty: 3 | Fill #3

## 2024-05-16 MED FILL — PREDNISONE 5 MG TABLET: ORAL | 30 days supply | Qty: 30 | Fill #5

## 2024-05-16 MED FILL — CARVEDILOL 6.25 MG TABLET: ORAL | 30 days supply | Qty: 60 | Fill #1

## 2024-05-16 MED FILL — DOCUSATE SODIUM 100 MG CAPSULE: ORAL | 50 days supply | Qty: 100 | Fill #1

## 2024-05-16 MED FILL — METFORMIN ER 500 MG TABLET,EXTENDED RELEASE 24 HR: ORAL | 30 days supply | Qty: 120 | Fill #6

## 2024-05-16 MED FILL — TACROLIMUS 1 MG CAPSULE, IMMEDIATE-RELEASE: ORAL | 30 days supply | Qty: 150 | Fill #7

## 2024-05-16 MED FILL — MG-PLUS-PROTEIN 133 MG TABLET: ORAL | 50 days supply | Qty: 100 | Fill #1

## 2024-05-16 MED FILL — AMLODIPINE 5 MG TABLET: ORAL | 30 days supply | Qty: 30 | Fill #1

## 2024-05-22 DIAGNOSIS — Z796 Long term current use of immunosuppressive drug: Principal | ICD-10-CM

## 2024-05-22 DIAGNOSIS — Z1339 Encounter for screening examination for other mental health and behavioral disorders: Principal | ICD-10-CM

## 2024-05-22 DIAGNOSIS — Z3490 Encounter for supervision of normal pregnancy, unspecified, unspecified trimester: Principal | ICD-10-CM

## 2024-05-22 DIAGNOSIS — Z131 Encounter for screening for diabetes mellitus: Principal | ICD-10-CM

## 2024-05-22 DIAGNOSIS — Z13220 Encounter for screening for lipoid disorders: Principal | ICD-10-CM

## 2024-05-22 DIAGNOSIS — Z1159 Encounter for screening for other viral diseases: Principal | ICD-10-CM

## 2024-05-22 DIAGNOSIS — Z944 Liver transplant status: Principal | ICD-10-CM

## 2024-05-29 DIAGNOSIS — Z13220 Encounter for screening for lipoid disorders: Principal | ICD-10-CM

## 2024-05-29 DIAGNOSIS — Z1339 Encounter for screening examination for other mental health and behavioral disorders: Principal | ICD-10-CM

## 2024-05-29 DIAGNOSIS — D702 Other drug-induced agranulocytosis: Principal | ICD-10-CM

## 2024-05-29 DIAGNOSIS — Z944 Liver transplant status: Principal | ICD-10-CM

## 2024-05-29 DIAGNOSIS — Z3490 Encounter for supervision of normal pregnancy, unspecified, unspecified trimester: Principal | ICD-10-CM

## 2024-05-29 DIAGNOSIS — Z1159 Encounter for screening for other viral diseases: Principal | ICD-10-CM

## 2024-05-29 DIAGNOSIS — Z796 Long term current use of immunosuppressive drug: Principal | ICD-10-CM

## 2024-05-29 DIAGNOSIS — Z131 Encounter for screening for diabetes mellitus: Principal | ICD-10-CM

## 2024-06-01 LAB — CBC W/ DIFFERENTIAL
BANDED NEUTROPHILS ABSOLUTE COUNT: 0 x10E3/uL (ref 0.0–0.1)
BASOPHILS ABSOLUTE COUNT: 0 x10E3/uL (ref 0.0–0.2)
BASOPHILS RELATIVE PERCENT: 0 %
EOSINOPHILS ABSOLUTE COUNT: 0.1 x10E3/uL (ref 0.0–0.4)
EOSINOPHILS RELATIVE PERCENT: 3 %
HEMATOCRIT: 42.1 % (ref 34.0–46.6)
HEMOGLOBIN: 13.5 g/dL (ref 11.1–15.9)
IMMATURE GRANULOCYTES: 0 %
LYMPHOCYTES ABSOLUTE COUNT: 0.8 x10E3/uL (ref 0.7–3.1)
LYMPHOCYTES RELATIVE PERCENT: 29 %
MEAN CORPUSCULAR HEMOGLOBIN CONC: 32.1 g/dL (ref 31.5–35.7)
MEAN CORPUSCULAR HEMOGLOBIN: 27.7 pg (ref 26.6–33.0)
MEAN CORPUSCULAR VOLUME: 86 fL (ref 79–97)
MONOCYTES ABSOLUTE COUNT: 0.2 x10E3/uL (ref 0.1–0.9)
MONOCYTES RELATIVE PERCENT: 8 %
NEUTROPHILS ABSOLUTE COUNT: 1.8 x10E3/uL (ref 1.4–7.0)
NEUTROPHILS RELATIVE PERCENT: 60 %
PLATELET COUNT: 121 x10E3/uL — ABNORMAL LOW (ref 150–450)
RED BLOOD CELL COUNT: 4.88 x10E6/uL (ref 3.77–5.28)
RED CELL DISTRIBUTION WIDTH: 14.2 % (ref 11.7–15.4)
WHITE BLOOD CELL COUNT: 2.9 x10E3/uL — ABNORMAL LOW (ref 3.4–10.8)

## 2024-06-01 LAB — COMPREHENSIVE METABOLIC PANEL
ALBUMIN: 4.6 g/dL (ref 3.9–4.9)
ALKALINE PHOSPHATASE: 91 IU/L (ref 41–116)
ALT (SGPT): 13 IU/L (ref 0–32)
AST (SGOT): 17 IU/L (ref 0–40)
BILIRUBIN TOTAL (MG/DL) IN SER/PLAS: 0.4 mg/dL (ref 0.0–1.2)
BLOOD UREA NITROGEN: 12 mg/dL (ref 6–24)
BUN / CREAT RATIO: 15 (ref 9–23)
CALCIUM: 8.9 mg/dL (ref 8.7–10.2)
CHLORIDE: 103 mmol/L (ref 96–106)
CO2: 24 mmol/L (ref 20–29)
CREATININE: 0.78 mg/dL (ref 0.57–1.00)
EGFR: 94 mL/min/1.73
GLOBULIN, TOTAL: 2.2 g/dL (ref 1.5–4.5)
GLUCOSE: 120 mg/dL — ABNORMAL HIGH (ref 70–99)
POTASSIUM: 4.4 mmol/L (ref 3.5–5.2)
SODIUM: 141 mmol/L (ref 134–144)
TOTAL PROTEIN: 6.8 g/dL (ref 6.0–8.5)

## 2024-06-01 LAB — GAMMA GT: GAMMA GLUTAMYL TRANSFERASE: 13 IU/L (ref 0–60)

## 2024-06-01 LAB — MAGNESIUM: MAGNESIUM: 2 mg/dL (ref 1.6–2.3)

## 2024-06-01 LAB — BILIRUBIN, DIRECT: BILIRUBIN DIRECT: 0.13 mg/dL (ref 0.00–0.40)

## 2024-06-01 LAB — PHOSPHORUS: PHOSPHORUS, SERUM: 3 mg/dL (ref 3.0–4.3)

## 2024-06-02 LAB — TACROLIMUS LEVEL: TACROLIMUS BLOOD: 5.7 ng/mL (ref 5.0–20.0)

## 2024-06-05 DIAGNOSIS — Z3490 Encounter for supervision of normal pregnancy, unspecified, unspecified trimester: Principal | ICD-10-CM

## 2024-06-05 DIAGNOSIS — Z796 Long term current use of immunosuppressive drug: Principal | ICD-10-CM

## 2024-06-05 DIAGNOSIS — Z131 Encounter for screening for diabetes mellitus: Principal | ICD-10-CM

## 2024-06-05 DIAGNOSIS — Z13220 Encounter for screening for lipoid disorders: Principal | ICD-10-CM

## 2024-06-05 DIAGNOSIS — Z944 Liver transplant status: Principal | ICD-10-CM

## 2024-06-05 DIAGNOSIS — Z1339 Encounter for screening examination for other mental health and behavioral disorders: Principal | ICD-10-CM

## 2024-06-05 DIAGNOSIS — Z1159 Encounter for screening for other viral diseases: Principal | ICD-10-CM

## 2024-06-09 DIAGNOSIS — T8641 Liver transplant rejection: Principal | ICD-10-CM

## 2024-06-09 DIAGNOSIS — Z944 Liver transplant status: Principal | ICD-10-CM

## 2024-06-09 MED ORDER — PREDNISONE 5 MG TABLET
ORAL_TABLET | Freq: Every day | ORAL | 5 refills | 30.00000 days | Status: CP
Start: 2024-06-09 — End: ?
  Filled 2024-06-13: qty 30, 30d supply, fill #0

## 2024-06-09 MED ORDER — DOCUSATE SODIUM 100 MG CAPSULE
ORAL_CAPSULE | Freq: Two times a day (BID) | ORAL | 1 refills | 50.00000 days | Status: CP | PRN
Start: 2024-06-09 — End: ?
  Filled 2024-06-13: qty 100, 50d supply, fill #0

## 2024-06-09 NOTE — Unmapped (Signed)
 Pt request for RX Refill

## 2024-06-09 NOTE — Unmapped (Signed)
 [facility] Specialty and Home Delivery Pharmacy Refill Coordination Note    Specialty Medication(s) to be Shipped:   Transplant: tacrolimus  1mg     Other medication(s) to be shipped: amlodipine , carvedilol , Vit D, Colace, Lantus , metformin , Ozempic , prednisone     Specialty Medications not needed at this time: N/A     Alicia Armstrong, DOB: 04/02/1976  Phone: [phone] (home) [phone] (work)      All above HIPAA information was verified with patient.     Was a Nurse, learning disability used for this call? No    Completed refill call assessment today to schedule patient's medication shipment from the [facility] and Home Delivery Pharmacy  [phone]).  All relevant notes have been reviewed.     Specialty medication(s) and dose(s) confirmed: Regimen is correct and unchanged.   Changes to medications: Alicia Armstrong reports no changes at this time.  Changes to insurance: No  New side effects reported not previously addressed with a pharmacist or physician: None reported  Questions for the pharmacist: No    Confirmed patient received a Conservation officer, historic buildings and a Surveyor, mining with first shipment. The patient will receive a drug information handout for each medication shipped and additional FDA Medication Guides as required.       DISEASE/MEDICATION-SPECIFIC INFORMATION        N/A    SPECIALTY MEDICATION ADHERENCE     Medication Adherence    Patient reported X missed doses in the last month: 0  Specialty Medication: tacrolimus  1 MG capsule (PROGRAF )  Patient is on additional specialty medications: No  Patient is on more than two specialty medications: No  Any gaps in refill history greater than 2 weeks in the last 3 months: no  Demonstrates understanding of importance of adherence: yes  Informant: patient  Confirmed plan for next specialty medication refill: delivery by pharmacy  Refills needed for supportive medications: not needed          Refill Coordination    Has the Patients' Contact Information Changed: No  Is the Shipping Address Different: No         Were doses missed due to medication being on hold? No    tacrolimus  1 mg: 5 days of medicine on hand       REFERRAL TO PHARMACIST     Referral to the pharmacist: Not needed      [facility]     Shipping address confirmed in Epic.     Cost and Payment: Patient has a copay of $70.12. They are aware and have authorized the pharmacy to charge the credit card on file.    Delivery Scheduled: Yes, Expected medication delivery date: 06/14/24.     Medication will be delivered via UPS to the prescription address in Epic WAM.    Alicia Armstrong   [facility] Specialty and Home Delivery Pharmacy  Specialty Technician

## 2024-06-12 DIAGNOSIS — Z1339 Encounter for screening examination for other mental health and behavioral disorders: Principal | ICD-10-CM

## 2024-06-12 DIAGNOSIS — Z1159 Encounter for screening for other viral diseases: Principal | ICD-10-CM

## 2024-06-12 DIAGNOSIS — Z3490 Encounter for supervision of normal pregnancy, unspecified, unspecified trimester: Principal | ICD-10-CM

## 2024-06-12 DIAGNOSIS — Z13220 Encounter for screening for lipoid disorders: Principal | ICD-10-CM

## 2024-06-12 DIAGNOSIS — Z944 Liver transplant status: Principal | ICD-10-CM

## 2024-06-12 DIAGNOSIS — Z131 Encounter for screening for diabetes mellitus: Principal | ICD-10-CM

## 2024-06-12 DIAGNOSIS — Z796 Long term current use of immunosuppressive drug: Principal | ICD-10-CM

## 2024-06-13 MED FILL — OZEMPIC 1 MG/DOSE (4 MG/3 ML) SUBCUTANEOUS PEN INJECTOR: SUBCUTANEOUS | 28 days supply | Qty: 3 | Fill #4

## 2024-06-13 MED FILL — TACROLIMUS 1 MG CAPSULE, IMMEDIATE-RELEASE: ORAL | 30 days supply | Qty: 150 | Fill #8

## 2024-06-13 MED FILL — CHOLECALCIFEROL (VITAMIN D3) 50 MCG (2,000 UNIT) CAPSULE: ORAL | 100 days supply | Qty: 100 | Fill #1

## 2024-06-13 MED FILL — LANTUS SOLOSTAR U-100 INSULIN 100 UNIT/ML (3 ML) SUBCUTANEOUS PEN: SUBCUTANEOUS | 90 days supply | Qty: 15 | Fill #0

## 2024-06-13 MED FILL — CARVEDILOL 6.25 MG TABLET: ORAL | 30 days supply | Qty: 60 | Fill #2

## 2024-06-13 MED FILL — AMLODIPINE 5 MG TABLET: ORAL | 30 days supply | Qty: 30 | Fill #2

## 2024-06-13 MED FILL — METFORMIN ER 500 MG TABLET,EXTENDED RELEASE 24 HR: ORAL | 30 days supply | Qty: 120 | Fill #7

## 2024-06-17 LAB — COMPREHENSIVE METABOLIC PANEL
ALBUMIN: 4.5 g/dL (ref 3.9–4.9)
ALKALINE PHOSPHATASE: 93 IU/L (ref 41–116)
ALT (SGPT): 15 IU/L (ref 0–32)
AST (SGOT): 18 IU/L (ref 0–40)
BILIRUBIN TOTAL (MG/DL) IN SER/PLAS: 0.3 mg/dL (ref 0.0–1.2)
BLOOD UREA NITROGEN: 6 mg/dL (ref 6–24)
BUN / CREAT RATIO: 8 — ABNORMAL LOW (ref 9–23)
CALCIUM: 8.8 mg/dL (ref 8.7–10.2)
CHLORIDE: 102 mmol/L (ref 96–106)
CO2: 23 mmol/L (ref 20–29)
CREATININE: 0.74 mg/dL (ref 0.57–1.00)
EGFR: 100 mL/min/1.73
GLOBULIN, TOTAL: 2.2 g/dL (ref 1.5–4.5)
GLUCOSE: 111 mg/dL — ABNORMAL HIGH (ref 70–99)
POTASSIUM: 4.4 mmol/L (ref 3.5–5.2)
SODIUM: 139 mmol/L (ref 134–144)
TOTAL PROTEIN: 6.7 g/dL (ref 6.0–8.5)

## 2024-06-17 LAB — CBC W/ DIFFERENTIAL
BANDED NEUTROPHILS ABSOLUTE COUNT: 0 x10E3/uL (ref 0.0–0.1)
BASOPHILS ABSOLUTE COUNT: 0 x10E3/uL (ref 0.0–0.2)
BASOPHILS RELATIVE PERCENT: 0 %
EOSINOPHILS ABSOLUTE COUNT: 0.2 x10E3/uL (ref 0.0–0.4)
EOSINOPHILS RELATIVE PERCENT: 5 %
HEMATOCRIT: 40.3 % (ref 34.0–46.6)
HEMOGLOBIN: 13 g/dL (ref 11.1–15.9)
IMMATURE GRANULOCYTES: 0 %
LYMPHOCYTES ABSOLUTE COUNT: 1 x10E3/uL (ref 0.7–3.1)
LYMPHOCYTES RELATIVE PERCENT: 31 %
MEAN CORPUSCULAR HEMOGLOBIN CONC: 32.3 g/dL (ref 31.5–35.7)
MEAN CORPUSCULAR HEMOGLOBIN: 27.6 pg (ref 26.6–33.0)
MEAN CORPUSCULAR VOLUME: 86 fL (ref 79–97)
MONOCYTES ABSOLUTE COUNT: 0.2 x10E3/uL (ref 0.1–0.9)
MONOCYTES RELATIVE PERCENT: 7 %
NEUTROPHILS ABSOLUTE COUNT: 1.9 x10E3/uL (ref 1.4–7.0)
NEUTROPHILS RELATIVE PERCENT: 57 %
PLATELET COUNT: 113 x10E3/uL — ABNORMAL LOW (ref 150–450)
RED BLOOD CELL COUNT: 4.71 x10E6/uL (ref 3.77–5.28)
RED CELL DISTRIBUTION WIDTH: 13.9 % (ref 11.7–15.4)
WHITE BLOOD CELL COUNT: 3.3 x10E3/uL — ABNORMAL LOW (ref 3.4–10.8)

## 2024-06-17 LAB — MAGNESIUM: MAGNESIUM: 1.6 mg/dL (ref 1.6–2.3)

## 2024-06-17 LAB — PHOSPHORUS: PHOSPHORUS, SERUM: 2.9 mg/dL — ABNORMAL LOW (ref 3.0–4.3)

## 2024-06-17 LAB — BILIRUBIN, DIRECT: BILIRUBIN DIRECT: <0.08 mg/dL (ref 0.00–0.40)

## 2024-06-17 LAB — GAMMA GT: GAMMA GLUTAMYL TRANSFERASE: 12 IU/L (ref 0–60)

## 2024-06-18 LAB — TACROLIMUS LEVEL: TACROLIMUS BLOOD: 7.7 ng/mL (ref 5.0–20.0)

## 2024-06-19 DIAGNOSIS — Z13220 Encounter for screening for lipoid disorders: Principal | ICD-10-CM

## 2024-06-19 DIAGNOSIS — Z131 Encounter for screening for diabetes mellitus: Principal | ICD-10-CM

## 2024-06-19 DIAGNOSIS — Z1339 Encounter for screening examination for other mental health and behavioral disorders: Principal | ICD-10-CM

## 2024-06-19 DIAGNOSIS — Z1159 Encounter for screening for other viral diseases: Principal | ICD-10-CM

## 2024-06-19 DIAGNOSIS — Z3490 Encounter for supervision of normal pregnancy, unspecified, unspecified trimester: Principal | ICD-10-CM

## 2024-06-19 DIAGNOSIS — Z944 Liver transplant status: Principal | ICD-10-CM

## 2024-06-19 DIAGNOSIS — Z796 Long term current use of immunosuppressive drug: Principal | ICD-10-CM

## 2024-06-19 NOTE — Telephone Encounter (Signed)
 Pt messaged in spanish and English to inform her that her labs are stable and her tacrolimus  level is within goal. No changes to be made. This coordinator's contact information shared with the patient in this message.

## 2024-06-26 DIAGNOSIS — Z131 Encounter for screening for diabetes mellitus: Principal | ICD-10-CM

## 2024-06-26 DIAGNOSIS — Z796 Long term current use of immunosuppressive drug: Principal | ICD-10-CM

## 2024-06-26 DIAGNOSIS — Z3490 Encounter for supervision of normal pregnancy, unspecified, unspecified trimester: Principal | ICD-10-CM

## 2024-06-26 DIAGNOSIS — Z13220 Encounter for screening for lipoid disorders: Principal | ICD-10-CM

## 2024-06-26 DIAGNOSIS — Z1339 Encounter for screening examination for other mental health and behavioral disorders: Principal | ICD-10-CM

## 2024-06-26 DIAGNOSIS — Z1159 Encounter for screening for other viral diseases: Principal | ICD-10-CM

## 2024-06-26 DIAGNOSIS — Z944 Liver transplant status: Principal | ICD-10-CM

## 2024-06-27 DIAGNOSIS — D702 Other drug-induced agranulocytosis: Principal | ICD-10-CM

## 2024-06-27 DIAGNOSIS — Z944 Liver transplant status: Principal | ICD-10-CM

## 2024-07-01 LAB — CBC W/ DIFFERENTIAL
BANDED NEUTROPHILS ABSOLUTE COUNT: 0 x10E3/uL (ref 0.0–0.1)
BASOPHILS ABSOLUTE COUNT: 0 x10E3/uL (ref 0.0–0.2)
BASOPHILS RELATIVE PERCENT: 0 %
EOSINOPHILS ABSOLUTE COUNT: 0.1 x10E3/uL (ref 0.0–0.4)
EOSINOPHILS RELATIVE PERCENT: 3 %
HEMATOCRIT: 43.2 % (ref 34.0–46.6)
HEMOGLOBIN: 13.6 g/dL (ref 11.1–15.9)
IMMATURE GRANULOCYTES: 0 %
LYMPHOCYTES ABSOLUTE COUNT: 1 x10E3/uL (ref 0.7–3.1)
LYMPHOCYTES RELATIVE PERCENT: 33 %
MEAN CORPUSCULAR HEMOGLOBIN CONC: 31.5 g/dL (ref 31.5–35.7)
MEAN CORPUSCULAR HEMOGLOBIN: 27.5 pg (ref 26.6–33.0)
MEAN CORPUSCULAR VOLUME: 87 fL (ref 79–97)
MONOCYTES ABSOLUTE COUNT: 0.2 x10E3/uL (ref 0.1–0.9)
MONOCYTES RELATIVE PERCENT: 7 %
NEUTROPHILS ABSOLUTE COUNT: 1.8 x10E3/uL (ref 1.4–7.0)
NEUTROPHILS RELATIVE PERCENT: 57 %
PLATELET COUNT: 114 x10E3/uL — ABNORMAL LOW (ref 150–450)
RED BLOOD CELL COUNT: 4.95 x10E6/uL (ref 3.77–5.28)
RED CELL DISTRIBUTION WIDTH: 14.3 % (ref 11.7–15.4)
WHITE BLOOD CELL COUNT: 3.1 x10E3/uL — ABNORMAL LOW (ref 3.4–10.8)

## 2024-07-01 LAB — COMPREHENSIVE METABOLIC PANEL
ALBUMIN: 4.6 g/dL (ref 3.9–4.9)
ALKALINE PHOSPHATASE: 101 IU/L (ref 41–116)
ALT (SGPT): 18 IU/L (ref 0–32)
AST (SGOT): 15 IU/L (ref 0–40)
BILIRUBIN TOTAL (MG/DL) IN SER/PLAS: 0.3 mg/dL (ref 0.0–1.2)
BLOOD UREA NITROGEN: 9 mg/dL (ref 6–24)
BUN / CREAT RATIO: 13 (ref 9–23)
CALCIUM: 8.9 mg/dL (ref 8.7–10.2)
CHLORIDE: 103 mmol/L (ref 96–106)
CO2: 24 mmol/L (ref 20–29)
CREATININE: 0.71 mg/dL (ref 0.57–1.00)
EGFR: 105 mL/min/1.73
GLOBULIN, TOTAL: 2.1 g/dL (ref 1.5–4.5)
GLUCOSE: 126 mg/dL — ABNORMAL HIGH (ref 70–99)
POTASSIUM: 4.6 mmol/L (ref 3.5–5.2)
SODIUM: 141 mmol/L (ref 134–144)
TOTAL PROTEIN: 6.7 g/dL (ref 6.0–8.5)

## 2024-07-01 LAB — PHOSPHORUS: PHOSPHORUS, SERUM: 2.8 mg/dL — ABNORMAL LOW (ref 3.0–4.3)

## 2024-07-01 LAB — MAGNESIUM: MAGNESIUM: 2 mg/dL (ref 1.6–2.3)

## 2024-07-01 LAB — BILIRUBIN, DIRECT: BILIRUBIN DIRECT: 0.11 mg/dL (ref 0.00–0.40)

## 2024-07-01 LAB — GAMMA GT: GAMMA GLUTAMYL TRANSFERASE: 15 IU/L (ref 0–60)

## 2024-07-02 LAB — TACROLIMUS LEVEL: TACROLIMUS BLOOD: 7 ng/mL (ref 5.0–20.0)

## 2024-07-03 DIAGNOSIS — Z1339 Encounter for screening examination for other mental health and behavioral disorders: Principal | ICD-10-CM

## 2024-07-03 DIAGNOSIS — Z3490 Encounter for supervision of normal pregnancy, unspecified, unspecified trimester: Principal | ICD-10-CM

## 2024-07-03 DIAGNOSIS — Z1159 Encounter for screening for other viral diseases: Principal | ICD-10-CM

## 2024-07-03 DIAGNOSIS — Z944 Liver transplant status: Principal | ICD-10-CM

## 2024-07-03 DIAGNOSIS — Z796 Long term current use of immunosuppressive drug: Principal | ICD-10-CM

## 2024-07-03 DIAGNOSIS — Z131 Encounter for screening for diabetes mellitus: Principal | ICD-10-CM

## 2024-07-03 DIAGNOSIS — Z13220 Encounter for screening for lipoid disorders: Principal | ICD-10-CM

## 2024-07-10 DIAGNOSIS — Z796 Long term current use of immunosuppressive drug: Principal | ICD-10-CM

## 2024-07-10 DIAGNOSIS — Z13220 Encounter for screening for lipoid disorders: Principal | ICD-10-CM

## 2024-07-10 DIAGNOSIS — Z131 Encounter for screening for diabetes mellitus: Principal | ICD-10-CM

## 2024-07-10 DIAGNOSIS — Z1339 Encounter for screening examination for other mental health and behavioral disorders: Principal | ICD-10-CM

## 2024-07-10 DIAGNOSIS — Z1159 Encounter for screening for other viral diseases: Principal | ICD-10-CM

## 2024-07-10 DIAGNOSIS — Z944 Liver transplant status: Principal | ICD-10-CM

## 2024-07-10 DIAGNOSIS — Z3490 Encounter for supervision of normal pregnancy, unspecified, unspecified trimester: Principal | ICD-10-CM

## 2024-07-10 NOTE — Progress Notes (Signed)
 [facility] Refill Coordination Note    Specialty Medication(s) to be Shipped:   Transplant: tacrolimus  1mg     Other medication(s) to be shipped: Dosusate, Mgnesium, Carvedilol , Amlodipine , Metformin , Ozempic  , Atorvastatin  and Prednisone     Specialty Medications not needed at this time: Transplant: mycophenolate  mofetil 250mg      Alicia Armstrong, DOB: July 04, 1976  Phone: [phone] (home) [phone] (work)      All above HIPAA information was verified with patient.     Was a nurse, learning disability used for this call? No    Completed refill call assessment today to schedule patient's medication shipment from the [facility]  [phone]).  All relevant notes have been reviewed.     Specialty medication(s) and dose(s) confirmed: Regimen is correct and unchanged.   Changes to medications: [name] reports no changes at this time.  Changes to insurance: No  New side effects reported not previously addressed with a pharmacist or physician: None reported  Questions for the pharmacist: No    Confirmed patient received a Conservation Officer, Historic Buildings and a Surveyor, Mining with first shipment. The patient will receive a drug information handout for each medication shipped and additional FDA Medication Guides as required.       DISEASE/MEDICATION-SPECIFIC INFORMATION        N/A    SPECIALTY MEDICATION ADHERENCE     Medication Adherence    Patient reported X missed doses in the last month: 0  Specialty Medication: tacrolimus  1 MG capsule (PROGRAF )  Patient is on additional specialty medications: No              Were doses missed due to medication being on hold? No      tacrolimus  1 MG capsule (PROGRAF ): 2 days of medicine on hand       REFERRAL TO PHARMACIST     Referral to the pharmacist: Not needed      [facility]     Shipping address confirmed in Epic.     Cost and Payment: Patient has a copay of $55.13. They are aware and have authorized the pharmacy to charge the credit card on file.    Delivery Scheduled: Yes, Expected medication delivery date: 11.19.25.     Medication will be delivered via UPS to the prescription address in Epic WAM.    Alicia Armstrong   [facility]  Specialty Technician

## 2024-07-11 MED FILL — CARVEDILOL 6.25 MG TABLET: ORAL | 30 days supply | Qty: 60 | Fill #3

## 2024-07-11 MED FILL — AMLODIPINE 5 MG TABLET: ORAL | 30 days supply | Qty: 30 | Fill #3

## 2024-07-11 MED FILL — MG-PLUS-PROTEIN 133 MG TABLET: ORAL | 50 days supply | Qty: 100 | Fill #2

## 2024-07-11 MED FILL — PREDNISONE 5 MG TABLET: ORAL | 30 days supply | Qty: 30 | Fill #1

## 2024-07-11 MED FILL — OZEMPIC 1 MG/DOSE (4 MG/3 ML) SUBCUTANEOUS PEN INJECTOR: SUBCUTANEOUS | 28 days supply | Qty: 3 | Fill #5

## 2024-07-11 MED FILL — DOCUSATE SODIUM 100 MG CAPSULE: ORAL | 50 days supply | Qty: 100 | Fill #1

## 2024-07-11 MED FILL — ATORVASTATIN 10 MG TABLET: ORAL | 30 days supply | Qty: 30 | Fill #3

## 2024-07-11 MED FILL — TACROLIMUS 1 MG CAPSULE, IMMEDIATE-RELEASE: ORAL | 30 days supply | Qty: 150 | Fill #9

## 2024-07-11 MED FILL — METFORMIN ER 500 MG TABLET,EXTENDED RELEASE 24 HR: ORAL | 30 days supply | Qty: 120 | Fill #8

## 2024-07-13 LAB — CBC W/ DIFFERENTIAL
BANDED NEUTROPHILS ABSOLUTE COUNT: 0 x10E3/uL (ref 0.0–0.1)
BASOPHILS ABSOLUTE COUNT: 0 x10E3/uL (ref 0.0–0.2)
BASOPHILS RELATIVE PERCENT: 0 %
EOSINOPHILS ABSOLUTE COUNT: 0.1 x10E3/uL (ref 0.0–0.4)
EOSINOPHILS RELATIVE PERCENT: 4 %
HEMATOCRIT: 42.2 % (ref 34.0–46.6)
HEMOGLOBIN: 13.4 g/dL (ref 11.1–15.9)
IMMATURE GRANULOCYTES: 0 %
LYMPHOCYTES ABSOLUTE COUNT: 0.7 x10E3/uL (ref 0.7–3.1)
LYMPHOCYTES RELATIVE PERCENT: 28 %
MEAN CORPUSCULAR HEMOGLOBIN CONC: 31.8 g/dL (ref 31.5–35.7)
MEAN CORPUSCULAR HEMOGLOBIN: 27.7 pg (ref 26.6–33.0)
MEAN CORPUSCULAR VOLUME: 87 fL (ref 79–97)
MONOCYTES ABSOLUTE COUNT: 0.2 x10E3/uL (ref 0.1–0.9)
MONOCYTES RELATIVE PERCENT: 9 %
NEUTROPHILS ABSOLUTE COUNT: 1.5 x10E3/uL (ref 1.4–7.0)
NEUTROPHILS RELATIVE PERCENT: 59 %
PLATELET COUNT: 107 x10E3/uL — ABNORMAL LOW (ref 150–450)
RED BLOOD CELL COUNT: 4.83 x10E6/uL (ref 3.77–5.28)
RED CELL DISTRIBUTION WIDTH: 14.2 % (ref 11.7–15.4)
WHITE BLOOD CELL COUNT: 2.5 x10E3/uL — CL (ref 3.4–10.8)

## 2024-07-13 LAB — COMPREHENSIVE METABOLIC PANEL
ALBUMIN: 4.4 g/dL (ref 3.9–4.9)
ALKALINE PHOSPHATASE: 87 IU/L (ref 41–116)
ALT (SGPT): 16 IU/L (ref 0–32)
AST (SGOT): 17 IU/L (ref 0–40)
BILIRUBIN TOTAL (MG/DL) IN SER/PLAS: 0.4 mg/dL (ref 0.0–1.2)
BLOOD UREA NITROGEN: 11 mg/dL (ref 6–24)
BUN / CREAT RATIO: 18 (ref 9–23)
CALCIUM: 8.8 mg/dL (ref 8.7–10.2)
CHLORIDE: 102 mmol/L (ref 96–106)
CO2: 23 mmol/L (ref 20–29)
CREATININE: 0.62 mg/dL (ref 0.57–1.00)
EGFR: 110 mL/min/1.73
GLOBULIN, TOTAL: 2.1 g/dL (ref 1.5–4.5)
GLUCOSE: 128 mg/dL — ABNORMAL HIGH (ref 70–99)
POTASSIUM: 4.3 mmol/L (ref 3.5–5.2)
SODIUM: 141 mmol/L (ref 134–144)
TOTAL PROTEIN: 6.5 g/dL (ref 6.0–8.5)

## 2024-07-13 LAB — BILIRUBIN, DIRECT: BILIRUBIN DIRECT: 0.1 mg/dL (ref 0.00–0.40)

## 2024-07-13 LAB — MAGNESIUM: MAGNESIUM: 1.8 mg/dL (ref 1.6–2.3)

## 2024-07-13 LAB — PHOSPHORUS: PHOSPHORUS, SERUM: 2.6 mg/dL — ABNORMAL LOW (ref 3.0–4.3)

## 2024-07-13 LAB — GAMMA GT: GAMMA GLUTAMYL TRANSFERASE: 13 IU/L (ref 0–60)

## 2024-07-14 LAB — TACROLIMUS LEVEL: TACROLIMUS BLOOD: 5.7 ng/mL (ref 5.0–20.0)

## 2024-07-17 ENCOUNTER — Telehealth: Payer: Self-pay | Admitting: Internal Medicine

## 2024-07-17 DIAGNOSIS — Z1159 Encounter for screening for other viral diseases: Principal | ICD-10-CM

## 2024-07-17 DIAGNOSIS — Z944 Liver transplant status: Principal | ICD-10-CM

## 2024-07-17 DIAGNOSIS — Z131 Encounter for screening for diabetes mellitus: Principal | ICD-10-CM

## 2024-07-17 DIAGNOSIS — Z1339 Encounter for screening examination for other mental health and behavioral disorders: Principal | ICD-10-CM

## 2024-07-17 DIAGNOSIS — Z13220 Encounter for screening for lipoid disorders: Principal | ICD-10-CM

## 2024-07-17 DIAGNOSIS — Z3490 Encounter for supervision of normal pregnancy, unspecified, unspecified trimester: Principal | ICD-10-CM

## 2024-07-17 DIAGNOSIS — Z129 Encounter for screening for malignant neoplasm, site unspecified: Principal | ICD-10-CM

## 2024-07-17 DIAGNOSIS — Z796 Long term current use of immunosuppressive drug: Principal | ICD-10-CM

## 2024-07-17 DIAGNOSIS — Z79621 Long-term current use of tacrolimus: Principal | ICD-10-CM

## 2024-07-17 NOTE — Telephone Encounter (Signed)
 Called PCP - Barnie Louder [phone] to inform of the bone pain and asked them to place dexa scan order for patient.     Clinic will place the order for the Dexa scan.

## 2024-07-17 NOTE — Telephone Encounter (Signed)
 Called patient.     Informed that her labs are good, will continue to monitor. No changes at this time. Pt states she needs referral for dermatologist through [facility]. And is having bone pain. I told her I will reach out to her PCP about ordering a dexascan.     Patient verbalized understanding.     Derm referral placed.    To call PCP for dexa order.

## 2024-07-17 NOTE — Telephone Encounter (Signed)
 Let caller know that patient needs to be seen.  Has not been seen in over 2 years.

## 2024-07-17 NOTE — Telephone Encounter (Signed)
 Copied from CRM [id number]. Topic: Appointments - Scheduling Inquiry for Clinic >> Jul 17, 2024  1:26 PM Avram MATSU wrote:  Reason for CRM: Katheryn is calling Liver Transplant Coordinator [facility] is calling to request a dexa scan for the patient.

## 2024-07-18 NOTE — Telephone Encounter (Signed)
 [facility], phone number: [phone]. Spoke to Chandler and let her know that the patient has not been seen in over 2 years and will need to call us  to schedule an appointment to be seen. Katheryn expressed verbal understanding and will call the patient to let her know. No further assistance needed at this time.

## 2024-07-24 DIAGNOSIS — Z944 Liver transplant status: Principal | ICD-10-CM

## 2024-07-24 DIAGNOSIS — Z131 Encounter for screening for diabetes mellitus: Principal | ICD-10-CM

## 2024-07-24 DIAGNOSIS — Z3490 Encounter for supervision of normal pregnancy, unspecified, unspecified trimester: Principal | ICD-10-CM

## 2024-07-24 DIAGNOSIS — D702 Other drug-induced agranulocytosis: Principal | ICD-10-CM

## 2024-07-24 DIAGNOSIS — Z1159 Encounter for screening for other viral diseases: Principal | ICD-10-CM

## 2024-07-24 DIAGNOSIS — Z796 Long term current use of immunosuppressive drug: Principal | ICD-10-CM

## 2024-07-24 DIAGNOSIS — Z13220 Encounter for screening for lipoid disorders: Principal | ICD-10-CM

## 2024-07-24 DIAGNOSIS — Z1339 Encounter for screening examination for other mental health and behavioral disorders: Principal | ICD-10-CM

## 2024-07-27 DIAGNOSIS — D1801 Hemangioma of skin and subcutaneous tissue: Principal | ICD-10-CM

## 2024-07-27 DIAGNOSIS — D229 Melanocytic nevi, unspecified: Principal | ICD-10-CM

## 2024-07-27 DIAGNOSIS — L859 Epidermal thickening, unspecified: Principal | ICD-10-CM

## 2024-07-27 DIAGNOSIS — L811 Chloasma: Principal | ICD-10-CM

## 2024-07-27 DIAGNOSIS — L814 Other melanin hyperpigmentation: Principal | ICD-10-CM

## 2024-07-27 MED ORDER — AMMONIUM LACTATE 12 % TOPICAL CREAM
Freq: Every day | TOPICAL | 1 refills | 0.00000 days | Status: CP | PRN
Start: 2024-07-27 — End: 2024-07-27

## 2024-07-27 MED ORDER — HYDROQUINONE 4 % TOPICAL CREAM
Freq: Two times a day (BID) | TOPICAL | 2 refills | 0.00000 days | Status: CN
Start: 2024-07-27 — End: 2025-07-27

## 2024-07-27 NOTE — Patient Instructions (Addendum)
 Eucerin Radiant Tone en la cara     Sunscreen   Clinique mineral sunscreen  Skinceuticals Physical Fusion UV Defense SPF 50  La Roche-Posay AntheliosMineral sunscreen  Avene SPF 50+ High protection mineral cream  Bioderma Photoderm MAX mineral compact SPF 50+  Avene MINERAL high protection tinted compact SPF 50    [facility] releases most results to you as soon as they are available. Therefore, you may see some results before we do. Please give us  3 business days to review the tests and contact you by phone or through MyChart. If you are concerned that some results may be upsetting or confusing, you may wish to wait until we contact you before looking at the report in MyChart.   If you have an urgent question, you can call our clinic. MyChart should not be used for urgent issues. Otherwise, we prefer that you wait 3 business days for us  to contact you.    [facility] Clinical Team

## 2024-07-27 NOTE — Progress Notes (Signed)
 Dermatology Note     Assessment and Plan:      Benign Lesions/ Findings:   Angioma(s)  Lentigo/Lentigines  Nevus/Nevi-Benign Appearing  - Reassurance provided regarding the benign appearance of lesions noted on exam today; no treatment is indicated in the absence of symptoms/changes.  - Reinforced importance of photoprotective strategies including liberal and frequent sunscreen use of a broad-spectrum SPF 30 or greater, use of protective clothing, and sun avoidance for prevention of cutaneous malignancy and photoaging.  Counseled patient on the importance of regular self-skin monitoring as well as routine clinical skin examinations as scheduled.     Melasma: chronic, not at treatment goal  - explained the etiology of the disorder, its difficulty to treat, and chronic nature  - Diagnosis, prognosis, treatment options including prescription medications, risks/benefits, and side effects were discussed with the patient. The patient made the informed decision to treat as orders and plan below.  - Start Radiant Tone from Eucerin OTC   - recommended diligent sun protection with mineral sunscreen  - if no improvement with over-the-counters, will consider hydroquinone  cream therapy.    Hyperkeratosis of Skin on the elbows  -Start ammonium lactate  (AMLACTIN) 12 % cream; Apply topically daily as needed for dry skin.     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  - Still recommended routine skin checks due to risk not being zero.  -Patient to notify us  if she sees a lesion on the skin that is non-healing, bleeding, itching or painful to notify us    -We also notified her about increased risk of viral warts due to immunosuppression, patient to let us  know if she sees any of these. None on exam today.     The patient was advised to call for an appointment should any new, changing, or symptomatic lesions develop.     RTC: Return in about 3 months (around 10/25/2024) for melasma. or sooner as needed   _________________________________________________________________      Chief Complaint     Chief Complaint   Patient presents with    Skin Check     FBSE; pt states elbows are very dry and discoloration on cheeks.       HPI     Alicia Armstrong is a 48 y.o. female who presents as a new patient to Dermatology for a full body skin exam. Has a few bumps on her elbows and would like to know what she can use for this. .   Has dark spots on her cheeks, wonders if there is a treatment that she should do for this.     The patient denies any other new or changing lesions or areas of concern.     Pertinent Past Medical History     No history of skin cancer    Problem List    None      Family History:   Negative for melanoma    Past Medical History, Family History, Social History, Medication List, Allergies, and Problem List were reviewed in the rooming section of Epic.     ROS: Other than symptoms mentioned in the HPI, no fevers, chills, or other skin complaints    Physical Examination     GENERAL: Well-appearing female in no acute distress, resting comfortably.  NEURO: Alert and oriented, answers questions appropriately  PSYCH: Normal mood and affect  RESP: No increased work of breathing  SKIN (Full Skin Exam): Examination of the face, eyelids, lips, nose, ears, neck, chest, abdomen, back, arms, legs, hands, feet, palms, soles, nails was performed  - Lentigo/lentigines: Scattered pigmented macules that are tan to brown in color and are somewhat non-uniform in shape and concentrated in the sun-exposed areas of the face and upper extremities  - Nevus/nevi: Scattered well-demarcated, regular, pigmented macule(s) and/or papule(s) on the scattered diffusely  - Seborrheic Keratosis(es): Stuck-on appearing keratotic papule(s) on the scattered diffusely, none irritated with redness, crusting, edema, and/or partial avulsion  - Hyperpigmented macules and patches with coalescence located symmetrically on the cheeks and upper lips of the face  - Follicular prominence and hyperkeratotic skin on the elbows     All areas not commented on are within normal limits or unremarkable      (Approved Template 05/06/2020)

## 2024-07-31 DIAGNOSIS — Z3490 Encounter for supervision of normal pregnancy, unspecified, unspecified trimester: Principal | ICD-10-CM

## 2024-07-31 DIAGNOSIS — Z796 Long term current use of immunosuppressive drug: Principal | ICD-10-CM

## 2024-07-31 DIAGNOSIS — Z131 Encounter for screening for diabetes mellitus: Principal | ICD-10-CM

## 2024-07-31 DIAGNOSIS — Z1339 Encounter for screening examination for other mental health and behavioral disorders: Principal | ICD-10-CM

## 2024-07-31 DIAGNOSIS — Z13220 Encounter for screening for lipoid disorders: Principal | ICD-10-CM

## 2024-07-31 DIAGNOSIS — Z1159 Encounter for screening for other viral diseases: Principal | ICD-10-CM

## 2024-07-31 DIAGNOSIS — Z944 Liver transplant status: Principal | ICD-10-CM

## 2024-08-04 DIAGNOSIS — Z944 Liver transplant status: Principal | ICD-10-CM

## 2024-08-04 LAB — LAB REPORT - SCANNED: EGFR: 105

## 2024-08-04 MED ORDER — CARVEDILOL 6.25 MG TABLET
ORAL_TABLET | Freq: Two times a day (BID) | ORAL | 3 refills | 30.00000 days
Start: 2024-08-04 — End: ?

## 2024-08-04 MED ORDER — DOCUSATE SODIUM 100 MG CAPSULE
ORAL_CAPSULE | Freq: Two times a day (BID) | ORAL | 1 refills | 50.00000 days | PRN
Start: 2024-08-04 — End: ?

## 2024-08-04 MED ORDER — AMLODIPINE 5 MG TABLET
ORAL_TABLET | Freq: Every day | ORAL | 3 refills | 30.00000 days
Start: 2024-08-04 — End: ?

## 2024-08-04 MED ORDER — MYCOPHENOLATE MOFETIL 250 MG CAPSULE
ORAL_CAPSULE | Freq: Two times a day (BID) | ORAL | 3 refills | 90.00000 days
Start: 2024-08-04 — End: 2025-08-04

## 2024-08-04 NOTE — Progress Notes (Signed)
 [facility] Refill Coordination Note    Specialty Medication(s) to be Shipped:   Transplant: tacrolimus  1mg     Other medication(s) to be shipped: Prednisone , Ozempic , Metformin , Docusate Sodium , Magnesium , Carvedilol , Atorvastatin  and Amlodipine     Specialty Medications not needed at this time: Transplant: mycophenolate  mofetil 250mg      Alicia Armstrong, DOB: 1976-07-21  Phone: [phone] (home) [phone] (work)      All above HIPAA information was verified with patient.     Was a nurse, learning disability used for this call? No    Completed refill call assessment today to schedule patient's medication shipment from the [facility]  [phone]).  All relevant notes have been reviewed.     Specialty medication(s) and dose(s) confirmed: Regimen is correct and unchanged.   Changes to medications: Voula reports no changes at this time.  Changes to insurance: No  New side effects reported not previously addressed with a pharmacist or physician: None reported  Questions for the pharmacist: No    Confirmed patient received a Conservation Officer, Historic Buildings and a Surveyor, Mining with first shipment. The patient will receive a drug information handout for each medication shipped and additional FDA Medication Guides as required.       DISEASE/MEDICATION-SPECIFIC INFORMATION        N/A    SPECIALTY MEDICATION ADHERENCE     Medication Adherence    Specialty Medication: tacrolimus  1 MG capsule (PROGRAF )  Patient is on additional specialty medications: Yes              Were doses missed due to medication being on hold? No      tacrolimus  1 MG capsule (PROGRAF ): 4 days of medicine on hand       Specialty medication is an injection or given on a cycle: No    REFERRAL TO PHARMACIST     Referral to the pharmacist: Not needed      [facility]     Shipping address confirmed in Epic.     Cost and Payment: Patient has a copay of $55.14. They are aware and have authorized the pharmacy to charge the credit card on file.    Delivery Scheduled: Yes, Expected medication delivery date: 12.17.25.     Medication will be delivered via UPS to the prescription address in Epic WAM.    Doyal Hurst   [facility]  Specialty Technician

## 2024-08-05 LAB — CBC W/ DIFFERENTIAL
BANDED NEUTROPHILS ABSOLUTE COUNT: 0 x10E3/uL (ref 0.0–0.1)
BASOPHILS ABSOLUTE COUNT: 0 x10E3/uL (ref 0.0–0.2)
BASOPHILS RELATIVE PERCENT: 0 %
EOSINOPHILS ABSOLUTE COUNT: 0.1 x10E3/uL (ref 0.0–0.4)
EOSINOPHILS RELATIVE PERCENT: 2 %
HEMATOCRIT: 41.4 % (ref 34.0–46.6)
HEMOGLOBIN: 13.6 g/dL (ref 11.1–15.9)
IMMATURE GRANULOCYTES: 0 %
LYMPHOCYTES ABSOLUTE COUNT: 0.9 x10E3/uL (ref 0.7–3.1)
LYMPHOCYTES RELATIVE PERCENT: 27 %
MEAN CORPUSCULAR HEMOGLOBIN CONC: 32.9 g/dL (ref 31.5–35.7)
MEAN CORPUSCULAR HEMOGLOBIN: 28.1 pg (ref 26.6–33.0)
MEAN CORPUSCULAR VOLUME: 86 fL (ref 79–97)
MONOCYTES ABSOLUTE COUNT: 0.3 x10E3/uL (ref 0.1–0.9)
MONOCYTES RELATIVE PERCENT: 10 %
NEUTROPHILS ABSOLUTE COUNT: 2 x10E3/uL (ref 1.4–7.0)
NEUTROPHILS RELATIVE PERCENT: 61 %
PLATELET COUNT: 116 x10E3/uL — ABNORMAL LOW (ref 150–450)
RED BLOOD CELL COUNT: 4.84 x10E6/uL (ref 3.77–5.28)
RED CELL DISTRIBUTION WIDTH: 13.7 % (ref 11.7–15.4)
WHITE BLOOD CELL COUNT: 3.3 x10E3/uL — ABNORMAL LOW (ref 3.4–10.8)

## 2024-08-05 LAB — COMPREHENSIVE METABOLIC PANEL
ALBUMIN: 4.6 g/dL (ref 3.9–4.9)
ALKALINE PHOSPHATASE: 79 IU/L (ref 41–116)
ALT (SGPT): 11 IU/L (ref 0–32)
AST (SGOT): 15 IU/L (ref 0–40)
BILIRUBIN TOTAL (MG/DL) IN SER/PLAS: 0.4 mg/dL (ref 0.0–1.2)
BLOOD UREA NITROGEN: 12 mg/dL (ref 6–24)
BUN / CREAT RATIO: 17 (ref 9–23)
CALCIUM: 9 mg/dL (ref 8.7–10.2)
CHLORIDE: 101 mmol/L (ref 96–106)
CO2: 24 mmol/L (ref 20–29)
CREATININE: 0.71 mg/dL (ref 0.57–1.00)
EGFR: 105 mL/min/1.73
GLOBULIN, TOTAL: 1.9 g/dL (ref 1.5–4.5)
GLUCOSE: 107 mg/dL — ABNORMAL HIGH (ref 70–99)
POTASSIUM: 3.9 mmol/L (ref 3.5–5.2)
SODIUM: 139 mmol/L (ref 134–144)
TOTAL PROTEIN: 6.5 g/dL (ref 6.0–8.5)

## 2024-08-05 LAB — MAGNESIUM: MAGNESIUM: 1.6 mg/dL (ref 1.6–2.3)

## 2024-08-05 LAB — PHOSPHORUS: PHOSPHORUS, SERUM: 3.6 mg/dL (ref 3.0–4.3)

## 2024-08-05 LAB — GAMMA GT: GAMMA GLUTAMYL TRANSFERASE: 10 IU/L (ref 0–60)

## 2024-08-05 LAB — BILIRUBIN, DIRECT: BILIRUBIN DIRECT: 0.13 mg/dL (ref 0.00–0.40)

## 2024-08-06 LAB — TACROLIMUS LEVEL: TACROLIMUS BLOOD: 5.4 ng/mL (ref 5.0–20.0)

## 2024-08-07 DIAGNOSIS — Z13220 Encounter for screening for lipoid disorders: Principal | ICD-10-CM

## 2024-08-07 DIAGNOSIS — Z1339 Encounter for screening examination for other mental health and behavioral disorders: Principal | ICD-10-CM

## 2024-08-07 DIAGNOSIS — Z944 Liver transplant status: Principal | ICD-10-CM

## 2024-08-07 DIAGNOSIS — Z1159 Encounter for screening for other viral diseases: Principal | ICD-10-CM

## 2024-08-07 DIAGNOSIS — Z131 Encounter for screening for diabetes mellitus: Principal | ICD-10-CM

## 2024-08-07 DIAGNOSIS — Z796 Long term current use of immunosuppressive drug: Principal | ICD-10-CM

## 2024-08-07 DIAGNOSIS — Z3490 Encounter for supervision of normal pregnancy, unspecified, unspecified trimester: Principal | ICD-10-CM

## 2024-08-07 MED ORDER — MYCOPHENOLATE MOFETIL 250 MG CAPSULE
ORAL_CAPSULE | Freq: Two times a day (BID) | ORAL | 3 refills | 90.00000 days | Status: CP
Start: 2024-08-07 — End: 2025-08-07
  Filled 2024-08-11: qty 180, 90d supply, fill #0

## 2024-08-07 MED ORDER — DOCUSATE SODIUM 100 MG CAPSULE
ORAL_CAPSULE | Freq: Two times a day (BID) | ORAL | 1 refills | 50.00000 days | Status: CP | PRN
Start: 2024-08-07 — End: ?
  Filled 2024-08-08: qty 100, 50d supply, fill #0

## 2024-08-07 MED ORDER — CARVEDILOL 6.25 MG TABLET
ORAL_TABLET | Freq: Two times a day (BID) | ORAL | 3 refills | 30.00000 days | Status: CP
Start: 2024-08-07 — End: ?
  Filled 2024-08-08: qty 60, 30d supply, fill #0

## 2024-08-07 MED ORDER — AMLODIPINE 5 MG TABLET
ORAL_TABLET | Freq: Every day | ORAL | 3 refills | 30.00000 days | Status: CP
Start: 2024-08-07 — End: ?
  Filled 2024-08-08: qty 30, 30d supply, fill #0

## 2024-08-07 NOTE — Telephone Encounter (Signed)
 Pt request for RX Refill

## 2024-08-08 MED FILL — PREDNISONE 5 MG TABLET: ORAL | 30 days supply | Qty: 30 | Fill #2

## 2024-08-08 MED FILL — MG-PLUS-PROTEIN 133 MG TABLET: ORAL | 50 days supply | Qty: 100 | Fill #3

## 2024-08-08 MED FILL — ATORVASTATIN 10 MG TABLET: ORAL | 30 days supply | Qty: 30 | Fill #4

## 2024-08-08 MED FILL — OZEMPIC 1 MG/DOSE (4 MG/3 ML) SUBCUTANEOUS PEN INJECTOR: SUBCUTANEOUS | 28 days supply | Qty: 3 | Fill #6

## 2024-08-08 MED FILL — TACROLIMUS 1 MG CAPSULE, IMMEDIATE-RELEASE: ORAL | 30 days supply | Qty: 150 | Fill #10

## 2024-08-08 MED FILL — METFORMIN ER 500 MG TABLET,EXTENDED RELEASE 24 HR: ORAL | 30 days supply | Qty: 120 | Fill #9

## 2024-08-09 NOTE — Progress Notes (Signed)
 [facility] Refill Coordination Note    Specialty Medication(s) to be Shipped:   Transplant: mycophenolate  mofetil 250mg     Other medication(s) to be shipped: No additional medications requested for fill at this time    Specialty Medications not needed at this time: N/A     Alicia Armstrong, DOB: 06/21/1976  Phone: [phone] (home) [phone] (work)      All above HIPAA information was verified with patient.     Was a nurse, learning disability used for this call? No    Completed refill call assessment today to schedule patient's medication shipment from the [facility]  [phone]).  All relevant notes have been reviewed.     Specialty medication(s) and dose(s) confirmed: Regimen is correct and unchanged.   Changes to medications: Alicia Armstrong reports no changes at this time.  Changes to insurance: No  New side effects reported not previously addressed with a pharmacist or physician: None reported  Questions for the pharmacist: No    Confirmed patient received a Conservation Officer, Historic Buildings and a Surveyor, Mining with first shipment. The patient will receive a drug information handout for each medication shipped and additional FDA Medication Guides as required.       DISEASE/MEDICATION-SPECIFIC INFORMATION        N/A    SPECIALTY MEDICATION ADHERENCE     Medication Adherence    Patient reported X missed doses in the last month: 0  Specialty Medication: mycophenolate  250 mg capsule (CELLCEPT )  Patient is on additional specialty medications: No  Patient is on more than two specialty medications: No  Any gaps in refill history greater than 2 weeks in the last 3 months: no  Demonstrates understanding of importance of adherence: yes  Informant: patient  Confirmed plan for next specialty medication refill: delivery by pharmacy  Refills needed for supportive medications: not needed          Refill Coordination    Has the Patients' Contact Information Changed: No  Is the Shipping Address Different: No         Were doses missed due to medication being on hold? No    mycophenolate  250   mg: 10 days of medicine on hand       Specialty medication is an injection or given on a cycle: No    REFERRAL TO PHARMACIST     Referral to the pharmacist: Not needed      [facility]     Shipping address confirmed in Epic.     Cost and Payment: Patient has a copay of $7.21. They are aware and have authorized the pharmacy to charge the credit card on file.    Delivery Scheduled: Yes, Expected medication delivery date: 08/14/24.     Medication will be delivered via UPS to the prescription address in Epic WAM.    [name]   [facility] Delivery Pharm[facility]  Specialty Technician

## 2024-08-14 DIAGNOSIS — Z1339 Encounter for screening examination for other mental health and behavioral disorders: Principal | ICD-10-CM

## 2024-08-14 DIAGNOSIS — Z796 Long term current use of immunosuppressive drug: Principal | ICD-10-CM

## 2024-08-14 DIAGNOSIS — Z131 Encounter for screening for diabetes mellitus: Principal | ICD-10-CM

## 2024-08-14 DIAGNOSIS — Z944 Liver transplant status: Principal | ICD-10-CM

## 2024-08-14 DIAGNOSIS — Z1159 Encounter for screening for other viral diseases: Principal | ICD-10-CM

## 2024-08-14 DIAGNOSIS — Z13220 Encounter for screening for lipoid disorders: Principal | ICD-10-CM

## 2024-08-14 DIAGNOSIS — Z3490 Encounter for supervision of normal pregnancy, unspecified, unspecified trimester: Principal | ICD-10-CM

## 2024-08-17 LAB — COMPREHENSIVE METABOLIC PANEL
ALBUMIN: 4.4 g/dL (ref 3.9–4.9)
ALKALINE PHOSPHATASE: 78 IU/L (ref 41–116)
ALT (SGPT): 12 IU/L (ref 0–32)
AST (SGOT): 12 IU/L (ref 0–40)
BILIRUBIN TOTAL (MG/DL) IN SER/PLAS: 0.5 mg/dL (ref 0.0–1.2)
BLOOD UREA NITROGEN: 10 mg/dL (ref 6–24)
BUN / CREAT RATIO: 14 (ref 9–23)
CALCIUM: 8.2 mg/dL — ABNORMAL LOW (ref 8.7–10.2)
CHLORIDE: 99 mmol/L (ref 96–106)
CO2: 24 mmol/L (ref 20–29)
CREATININE: 0.7 mg/dL (ref 0.57–1.00)
EGFR: 107 mL/min/1.73
GLOBULIN, TOTAL: 2 g/dL (ref 1.5–4.5)
GLUCOSE: 122 mg/dL — ABNORMAL HIGH (ref 70–99)
POTASSIUM: 3.6 mmol/L (ref 3.5–5.2)
SODIUM: 137 mmol/L (ref 134–144)
TOTAL PROTEIN: 6.4 g/dL (ref 6.0–8.5)

## 2024-08-17 LAB — CBC W/ DIFFERENTIAL
BANDED NEUTROPHILS ABSOLUTE COUNT: 0 x10E3/uL (ref 0.0–0.1)
BASOPHILS ABSOLUTE COUNT: 0 x10E3/uL (ref 0.0–0.2)
BASOPHILS RELATIVE PERCENT: 0 %
EOSINOPHILS ABSOLUTE COUNT: 0.1 x10E3/uL (ref 0.0–0.4)
EOSINOPHILS RELATIVE PERCENT: 2 %
HEMATOCRIT: 40 % (ref 34.0–46.6)
HEMOGLOBIN: 13.3 g/dL (ref 11.1–15.9)
IMMATURE GRANULOCYTES: 0 %
LYMPHOCYTES ABSOLUTE COUNT: 1.1 x10E3/uL (ref 0.7–3.1)
LYMPHOCYTES RELATIVE PERCENT: 30 %
MEAN CORPUSCULAR HEMOGLOBIN CONC: 33.3 g/dL (ref 31.5–35.7)
MEAN CORPUSCULAR HEMOGLOBIN: 28.2 pg (ref 26.6–33.0)
MEAN CORPUSCULAR VOLUME: 85 fL (ref 79–97)
MONOCYTES ABSOLUTE COUNT: 0.4 x10E3/uL (ref 0.1–0.9)
MONOCYTES RELATIVE PERCENT: 11 %
NEUTROPHILS ABSOLUTE COUNT: 2.1 x10E3/uL (ref 1.4–7.0)
NEUTROPHILS RELATIVE PERCENT: 57 %
PLATELET COUNT: 77 x10E3/uL — CL (ref 150–450)
RED BLOOD CELL COUNT: 4.72 x10E6/uL (ref 3.77–5.28)
RED CELL DISTRIBUTION WIDTH: 13.9 % (ref 11.7–15.4)
WHITE BLOOD CELL COUNT: 3.7 x10E3/uL (ref 3.4–10.8)

## 2024-08-17 LAB — BILIRUBIN, DIRECT: BILIRUBIN DIRECT: 0.17 mg/dL (ref 0.00–0.40)

## 2024-08-17 LAB — MAGNESIUM: MAGNESIUM: 1.9 mg/dL (ref 1.6–2.3)

## 2024-08-17 LAB — GAMMA GT: GAMMA GLUTAMYL TRANSFERASE: 15 IU/L (ref 0–60)

## 2024-08-17 LAB — PHOSPHORUS: PHOSPHORUS, SERUM: 2.3 mg/dL — ABNORMAL LOW (ref 3.0–4.3)

## 2024-08-18 LAB — TACROLIMUS LEVEL: TACROLIMUS BLOOD: 3.3 ng/mL — ABNORMAL LOW (ref 5.0–20.0)

## 2024-08-21 DIAGNOSIS — Z3490 Encounter for supervision of normal pregnancy, unspecified, unspecified trimester: Principal | ICD-10-CM

## 2024-08-21 DIAGNOSIS — Z944 Liver transplant status: Principal | ICD-10-CM

## 2024-08-21 DIAGNOSIS — Z796 Long term current use of immunosuppressive drug: Principal | ICD-10-CM

## 2024-08-21 DIAGNOSIS — Z131 Encounter for screening for diabetes mellitus: Principal | ICD-10-CM

## 2024-08-21 DIAGNOSIS — Z13220 Encounter for screening for lipoid disorders: Principal | ICD-10-CM

## 2024-08-21 DIAGNOSIS — Z1159 Encounter for screening for other viral diseases: Principal | ICD-10-CM

## 2024-08-21 DIAGNOSIS — Z1339 Encounter for screening examination for other mental health and behavioral disorders: Principal | ICD-10-CM

## 2024-08-28 DIAGNOSIS — Z944 Liver transplant status: Principal | ICD-10-CM

## 2024-08-28 DIAGNOSIS — Z13220 Encounter for screening for lipoid disorders: Principal | ICD-10-CM

## 2024-08-28 DIAGNOSIS — Z3490 Encounter for supervision of normal pregnancy, unspecified, unspecified trimester: Principal | ICD-10-CM

## 2024-08-28 DIAGNOSIS — Z1159 Encounter for screening for other viral diseases: Principal | ICD-10-CM

## 2024-08-28 DIAGNOSIS — Z131 Encounter for screening for diabetes mellitus: Principal | ICD-10-CM

## 2024-08-28 DIAGNOSIS — Z796 Long term current use of immunosuppressive drug: Principal | ICD-10-CM

## 2024-08-28 DIAGNOSIS — Z1339 Encounter for screening examination for other mental health and behavioral disorders: Principal | ICD-10-CM

## 2024-08-30 LAB — LAB REPORT - SCANNED: EGFR: 100

## 2024-08-31 LAB — CBC W/ DIFFERENTIAL
BANDED NEUTROPHILS ABSOLUTE COUNT: 0 x10E3/uL (ref 0.0–0.1)
BASOPHILS ABSOLUTE COUNT: 0 x10E3/uL (ref 0.0–0.2)
BASOPHILS RELATIVE PERCENT: 0 %
EOSINOPHILS ABSOLUTE COUNT: 0.1 x10E3/uL (ref 0.0–0.4)
EOSINOPHILS RELATIVE PERCENT: 2 %
HEMATOCRIT: 42.5 % (ref 34.0–46.6)
HEMOGLOBIN: 13.9 g/dL (ref 11.1–15.9)
IMMATURE GRANULOCYTES: 0 %
LYMPHOCYTES ABSOLUTE COUNT: 1 x10E3/uL (ref 0.7–3.1)
LYMPHOCYTES RELATIVE PERCENT: 25 %
MEAN CORPUSCULAR HEMOGLOBIN CONC: 32.7 g/dL (ref 31.5–35.7)
MEAN CORPUSCULAR HEMOGLOBIN: 27.7 pg (ref 26.6–33.0)
MEAN CORPUSCULAR VOLUME: 85 fL (ref 79–97)
MONOCYTES ABSOLUTE COUNT: 0.3 x10E3/uL (ref 0.1–0.9)
MONOCYTES RELATIVE PERCENT: 7 %
NEUTROPHILS ABSOLUTE COUNT: 2.6 x10E3/uL (ref 1.4–7.0)
NEUTROPHILS RELATIVE PERCENT: 66 %
PLATELET COUNT (LABCORP): 175 x10E3/uL (ref 150–450)
RED BLOOD CELL COUNT: 5.02 x10E6/uL (ref 3.77–5.28)
RED CELL DISTRIBUTION WIDTH: 13.5 % (ref 11.7–15.4)
WHITE BLOOD CELL COUNT: 3.9 x10E3/uL (ref 3.4–10.8)

## 2024-08-31 LAB — COMPREHENSIVE METABOLIC PANEL
ALBUMIN: 4.5 g/dL (ref 3.9–4.9)
ALKALINE PHOSPHATASE: 85 IU/L (ref 41–116)
ALT (SGPT): 11 IU/L (ref 0–32)
AST (SGOT): 12 IU/L (ref 0–40)
BILIRUBIN TOTAL (MG/DL) IN SER/PLAS: 0.5 mg/dL (ref 0.0–1.2)
BLOOD UREA NITROGEN: 15 mg/dL (ref 6–24)
BUN / CREAT RATIO: 20 (ref 9–23)
CALCIUM: 8.6 mg/dL — ABNORMAL LOW (ref 8.7–10.2)
CHLORIDE: 102 mmol/L (ref 96–106)
CO2: 23 mmol/L (ref 20–29)
CREATININE: 0.74 mg/dL (ref 0.57–1.00)
EGFR: 100 mL/min/1.73
GLOBULIN, TOTAL: 2.3 g/dL (ref 1.5–4.5)
GLUCOSE: 122 mg/dL — ABNORMAL HIGH (ref 70–99)
POTASSIUM: 4.2 mmol/L (ref 3.5–5.2)
SODIUM: 139 mmol/L (ref 134–144)
TOTAL PROTEIN: 6.8 g/dL (ref 6.0–8.5)

## 2024-08-31 LAB — BILIRUBIN, DIRECT: BILIRUBIN DIRECT: 0.14 mg/dL (ref 0.00–0.40)

## 2024-08-31 LAB — PHOSPHORUS: PHOSPHORUS, SERUM: 2.6 mg/dL — ABNORMAL LOW (ref 3.0–4.3)

## 2024-08-31 LAB — MAGNESIUM: MAGNESIUM: 1.9 mg/dL (ref 1.6–2.3)

## 2024-08-31 LAB — GAMMA GT: GAMMA GLUTAMYL TRANSFERASE: 13 IU/L (ref 0–60)

## 2024-09-01 LAB — TACROLIMUS LEVEL: TACROLIMUS BLOOD: 5.4 ng/mL (ref 5.0–20.0)

## 2024-09-04 DIAGNOSIS — Z1159 Encounter for screening for other viral diseases: Principal | ICD-10-CM

## 2024-09-04 DIAGNOSIS — Z1339 Encounter for screening examination for other mental health and behavioral disorders: Principal | ICD-10-CM

## 2024-09-04 DIAGNOSIS — Z3490 Encounter for supervision of normal pregnancy, unspecified, unspecified trimester: Principal | ICD-10-CM

## 2024-09-04 DIAGNOSIS — Z131 Encounter for screening for diabetes mellitus: Principal | ICD-10-CM

## 2024-09-04 DIAGNOSIS — Z944 Liver transplant status: Principal | ICD-10-CM

## 2024-09-04 DIAGNOSIS — Z796 Long term current use of immunosuppressive drug: Principal | ICD-10-CM

## 2024-09-04 DIAGNOSIS — Z13220 Encounter for screening for lipoid disorders: Principal | ICD-10-CM

## 2024-09-04 MED ORDER — MG-PLUS-PROTEIN 133 MG TABLET
ORAL_TABLET | Freq: Two times a day (BID) | ORAL | 3 refills | 50.00000 days
Start: 2024-09-04 — End: ?

## 2024-09-04 MED ORDER — TACROLIMUS 1 MG CAPSULE, IMMEDIATE-RELEASE
ORAL_CAPSULE | ORAL | 11 refills | 30.00000 days
Start: 2024-09-04 — End: ?

## 2024-09-04 NOTE — Telephone Encounter (Signed)
 Pt request for RX Refill

## 2024-09-04 NOTE — Progress Notes (Signed)
 09/04/2024 [facility] Pharm[facility] Refill Coordination Note    Specialty Medication(s) to be Shipped:   Transplant: tacrolimus  1mg     Other medication(s) to be shipped: amlodipine  5 MG tablet (NORVASC ),carvedilol  6.25 MG tablet (COREG ),metFORMIN  500 MG 24 hr tablet (GLUCOPHAGE -XR),predniSONE  5 MG tablet (DELTASONE ),atorvastatin  10 MG tablet (LIPITOR),docusate sodium  100 MG capsule (COLACE),cholecalciferol  (vitamin D3-50 mcg (2,000 unit)) 50 mcg (2,000 unit) Cap (VITAMIN D3),magnesium  (amino acid chelate) 133 mg tablet (magnesium  oxide-Mg AA chelate)    Specialty Medications not needed at this time: Specialty Lite: Ozempic      Alicia Armstrong, DOB: August 30, 1975  Phone: [phone] (home) [phone] (work)      All above HIPAA information was verified with patient.     Was a nurse, learning disability used for this call? No    Completed refill call assessment today to schedule patient's medication shipment from the [facility]  [phone]).  All relevant notes have been reviewed.     Specialty medication(s) and dose(s) confirmed: Regimen is correct and unchanged.   Changes to medications: Omaria reports no changes at this time.  Changes to insurance: No  New side effects reported not previously addressed with a pharmacist or physician: None reported  Questions for the pharmacist: No    Confirmed patient received a Conservation Officer, Historic Buildings and a Surveyor, Mining with first shipment. The patient will receive a drug information handout for each medication shipped and additional FDA Medication Guides as required.       DISEASE/MEDICATION-SPECIFIC INFORMATION        N/A    SPECIALTY MEDICATION ADHERENCE     Medication Adherence    Patient reported X missed doses in the last month: 0  Specialty Medication: tacrolimus  1 MG capsule (PROGRAF )  Patient is on additional specialty medications: No              Were doses missed due to medication being on hold? No    tacrolimus  1 MG capsule (PROGRAF ): 2 doses of medicine on hand     Specialty medication is an injection or given on a cycle: No    REFERRAL TO PHARMACIST     Referral to the pharmacist: Not needed      [facility]     Shipping address confirmed in Epic.     Cost and Payment: Patient has a copay of $86.73. They are aware and have authorized the pharmacy to charge the credit card on file.    Delivery Scheduled: Yes, Expected medication delivery date: 09/08/24.  However, Rx request for refills was sent to the provider as there are none remaining.     Medication will be delivered via UPS to the prescription address in Epic WAM.    Gwenda Heiner   [facility]  Specialty Technician

## 2024-09-05 MED ORDER — MG-PLUS-PROTEIN 133 MG TABLET
ORAL_TABLET | Freq: Two times a day (BID) | ORAL | 3 refills | 50.00000 days | Status: CP
Start: 2024-09-05 — End: ?
  Filled 2024-09-07: qty 100, 50d supply, fill #0

## 2024-09-05 MED ORDER — TACROLIMUS 1 MG CAPSULE, IMMEDIATE-RELEASE
ORAL_CAPSULE | ORAL | 11 refills | 30.00000 days | Status: CP
Start: 2024-09-05 — End: ?
  Filled 2024-09-07: qty 150, 30d supply, fill #0

## 2024-09-07 MED FILL — ATORVASTATIN 10 MG TABLET: ORAL | 30 days supply | Qty: 30 | Fill #5

## 2024-09-07 MED FILL — METFORMIN ER 500 MG TABLET,EXTENDED RELEASE 24 HR: ORAL | 30 days supply | Qty: 120 | Fill #10

## 2024-09-07 MED FILL — CARVEDILOL 6.25 MG TABLET: ORAL | 30 days supply | Qty: 60 | Fill #1

## 2024-09-07 MED FILL — AMLODIPINE 5 MG TABLET: ORAL | 30 days supply | Qty: 30 | Fill #1

## 2024-09-07 MED FILL — CHOLECALCIFEROL (VITAMIN D3) 50 MCG (2,000 UNIT) CAPSULE: ORAL | 100 days supply | Qty: 100 | Fill #2

## 2024-09-07 MED FILL — OZEMPIC 1 MG/DOSE (4 MG/3 ML) SUBCUTANEOUS PEN INJECTOR: SUBCUTANEOUS | 28 days supply | Qty: 3 | Fill #7

## 2024-09-07 MED FILL — DOCUSATE SODIUM 100 MG CAPSULE: ORAL | 50 days supply | Qty: 100 | Fill #1

## 2024-09-07 MED FILL — PREDNISONE 5 MG TABLET: ORAL | 30 days supply | Qty: 30 | Fill #3

## 2024-09-08 NOTE — Telephone Encounter (Signed)
 Reviewed labs with patient. Barritt ok'd change to 4-6 for tac goal. Goal now 5.4.   Instructed patient to eat more calcium , list of options given.    Patient informed me that she needs a bone density test. Placing order now and placing in check list.

## 2024-09-11 DIAGNOSIS — Z796 Long term current use of immunosuppressive drug: Principal | ICD-10-CM

## 2024-09-11 DIAGNOSIS — Z3490 Encounter for supervision of normal pregnancy, unspecified, unspecified trimester: Principal | ICD-10-CM

## 2024-09-11 DIAGNOSIS — Z1339 Encounter for screening examination for other mental health and behavioral disorders: Principal | ICD-10-CM

## 2024-09-11 DIAGNOSIS — Z944 Liver transplant status: Principal | ICD-10-CM

## 2024-09-11 DIAGNOSIS — Z13220 Encounter for screening for lipoid disorders: Principal | ICD-10-CM

## 2024-09-11 DIAGNOSIS — Z1159 Encounter for screening for other viral diseases: Principal | ICD-10-CM

## 2024-09-11 DIAGNOSIS — Z131 Encounter for screening for diabetes mellitus: Principal | ICD-10-CM

## 2024-09-16 LAB — LAB REPORT - SCANNED: EGFR: 87

## 2024-09-16 LAB — COMPREHENSIVE METABOLIC PANEL
ALBUMIN: 4.4 g/dL (ref 3.9–4.9)
ALKALINE PHOSPHATASE: 75 [IU]/L (ref 41–116)
ALT (SGPT): 12 [IU]/L (ref 0–32)
AST (SGOT): 12 [IU]/L (ref 0–40)
BILIRUBIN TOTAL (MG/DL) IN SER/PLAS: 0.4 mg/dL (ref 0.0–1.2)
BLOOD UREA NITROGEN: 17 mg/dL (ref 6–24)
BUN / CREAT RATIO: 20 (ref 9–23)
CALCIUM: 8.7 mg/dL (ref 8.7–10.2)
CHLORIDE: 103 mmol/L (ref 96–106)
CO2: 22 mmol/L (ref 20–29)
CREATININE: 0.83 mg/dL (ref 0.57–1.00)
EGFR: 87 mL/min/{1.73_m2}
GLOBULIN, TOTAL: 2.1 g/dL (ref 1.5–4.5)
GLUCOSE: 114 mg/dL — ABNORMAL HIGH (ref 70–99)
POTASSIUM: 4 mmol/L (ref 3.5–5.2)
SODIUM: 138 mmol/L (ref 134–144)
TOTAL PROTEIN: 6.5 g/dL (ref 6.0–8.5)

## 2024-09-16 LAB — CBC W/ DIFFERENTIAL
BANDED NEUTROPHILS ABSOLUTE COUNT: 0 10*3/uL (ref 0.0–0.1)
BASOPHILS ABSOLUTE COUNT: 0 10*3/uL (ref 0.0–0.2)
BASOPHILS RELATIVE PERCENT: 0 %
EOSINOPHILS ABSOLUTE COUNT: 0.1 10*3/uL (ref 0.0–0.4)
EOSINOPHILS RELATIVE PERCENT: 3 %
HEMATOCRIT: 39.8 % (ref 34.0–46.6)
HEMOGLOBIN: 13.1 g/dL (ref 11.1–15.9)
IMMATURE GRANULOCYTES: 0 %
LYMPHOCYTES ABSOLUTE COUNT: 0.8 10*3/uL (ref 0.7–3.1)
LYMPHOCYTES RELATIVE PERCENT: 31 %
MEAN CORPUSCULAR HEMOGLOBIN CONC: 32.9 g/dL (ref 31.5–35.7)
MEAN CORPUSCULAR HEMOGLOBIN: 27.7 pg (ref 26.6–33.0)
MEAN CORPUSCULAR VOLUME: 84 fL (ref 79–97)
MONOCYTES ABSOLUTE COUNT: 0.2 10*3/uL (ref 0.1–0.9)
MONOCYTES RELATIVE PERCENT: 9 %
NEUTROPHILS ABSOLUTE COUNT: 1.6 10*3/uL (ref 1.4–7.0)
NEUTROPHILS RELATIVE PERCENT: 57 %
PLATELET COUNT (LABCORP): 101 10*3/uL — ABNORMAL LOW (ref 150–450)
RED BLOOD CELL COUNT: 4.73 x10E6/uL (ref 3.77–5.28)
RED CELL DISTRIBUTION WIDTH: 13.8 % (ref 11.7–15.4)
WHITE BLOOD CELL COUNT: 2.7 10*3/uL — ABNORMAL LOW (ref 3.4–10.8)

## 2024-09-16 LAB — BILIRUBIN, DIRECT: BILIRUBIN DIRECT: 0.12 mg/dL (ref 0.00–0.40)

## 2024-09-16 LAB — PHOSPHORUS: PHOSPHORUS, SERUM: 2.9 mg/dL — ABNORMAL LOW (ref 3.0–4.3)

## 2024-09-16 LAB — GAMMA GT: GAMMA GLUTAMYL TRANSFERASE: 10 [IU]/L (ref 0–60)

## 2024-09-16 LAB — MAGNESIUM: MAGNESIUM: 1.8 mg/dL (ref 1.6–2.3)

## 2024-09-17 LAB — TACROLIMUS LEVEL: TACROLIMUS BLOOD: 6.2 ng/mL (ref 5.0–20.0)

## 2024-09-18 DIAGNOSIS — Z13220 Encounter for screening for lipoid disorders: Secondary | ICD-10-CM

## 2024-09-18 DIAGNOSIS — Z3490 Encounter for supervision of normal pregnancy, unspecified, unspecified trimester: Secondary | ICD-10-CM

## 2024-09-18 DIAGNOSIS — Z796 Long term current use of immunosuppressive drug: Secondary | ICD-10-CM

## 2024-09-18 DIAGNOSIS — Z1339 Encounter for screening examination for other mental health and behavioral disorders: Secondary | ICD-10-CM

## 2024-09-18 DIAGNOSIS — Z1159 Encounter for screening for other viral diseases: Secondary | ICD-10-CM

## 2024-09-18 DIAGNOSIS — Z131 Encounter for screening for diabetes mellitus: Secondary | ICD-10-CM

## 2024-09-18 DIAGNOSIS — Z944 Liver transplant status: Principal | ICD-10-CM

## 2024-09-25 DIAGNOSIS — Z13220 Encounter for screening for lipoid disorders: Secondary | ICD-10-CM

## 2024-09-25 DIAGNOSIS — Z3490 Encounter for supervision of normal pregnancy, unspecified, unspecified trimester: Secondary | ICD-10-CM

## 2024-09-25 DIAGNOSIS — Z944 Liver transplant status: Principal | ICD-10-CM

## 2024-09-25 DIAGNOSIS — Z1159 Encounter for screening for other viral diseases: Secondary | ICD-10-CM

## 2024-09-25 DIAGNOSIS — Z796 Long term current use of immunosuppressive drug: Secondary | ICD-10-CM

## 2024-09-25 DIAGNOSIS — Z131 Encounter for screening for diabetes mellitus: Secondary | ICD-10-CM

## 2024-09-25 DIAGNOSIS — Z1339 Encounter for screening examination for other mental health and behavioral disorders: Secondary | ICD-10-CM
